# Patient Record
Sex: Male | Born: 1950
Health system: Southern US, Community
[De-identification: ages and names within clinical notes are randomized; demographics above are authoritative.]

## PROBLEM LIST (undated history)

## (undated) DIAGNOSIS — I499 Cardiac arrhythmia, unspecified: Secondary | ICD-10-CM

## (undated) DIAGNOSIS — E785 Hyperlipidemia, unspecified: Secondary | ICD-10-CM

## (undated) DIAGNOSIS — D689 Coagulation defect, unspecified: Secondary | ICD-10-CM

## (undated) DIAGNOSIS — G4730 Sleep apnea, unspecified: Secondary | ICD-10-CM

## (undated) DIAGNOSIS — N529 Male erectile dysfunction, unspecified: Secondary | ICD-10-CM

## (undated) DIAGNOSIS — E291 Testicular hypofunction: Secondary | ICD-10-CM

## (undated) DIAGNOSIS — E119 Type 2 diabetes mellitus without complications: Secondary | ICD-10-CM

## (undated) DIAGNOSIS — C801 Malignant (primary) neoplasm, unspecified: Secondary | ICD-10-CM

## (undated) DIAGNOSIS — I2510 Atherosclerotic heart disease of native coronary artery without angina pectoris: Secondary | ICD-10-CM

## (undated) DIAGNOSIS — I10 Essential (primary) hypertension: Secondary | ICD-10-CM

## (undated) DIAGNOSIS — I4891 Unspecified atrial fibrillation: Secondary | ICD-10-CM

## (undated) HISTORY — DX: Testicular hypofunction: E29.1

## (undated) HISTORY — DX: Hyperlipidemia, unspecified: E78.5

## (undated) HISTORY — DX: Unspecified atrial fibrillation: I48.91

## (undated) HISTORY — DX: Malignant (primary) neoplasm, unspecified: C80.1

## (undated) HISTORY — DX: Coagulation defect, unspecified: D68.9

## (undated) HISTORY — DX: Male erectile dysfunction, unspecified: N52.9

## (undated) HISTORY — DX: Essential (primary) hypertension: I10

## (undated) HISTORY — DX: Sleep apnea, unspecified: G47.30

## (undated) HISTORY — PX: PARATHYROIDECTOMY: SHX19

---

## 2004-06-10 ENCOUNTER — Ambulatory Visit (HOSPITAL_COMMUNITY): Admission: RE | Admit: 2004-06-10 | Discharge: 2004-06-10 | Payer: Self-pay | Admitting: Gastroenterology

## 2004-11-08 ENCOUNTER — Ambulatory Visit (HOSPITAL_COMMUNITY): Admission: RE | Admit: 2004-11-08 | Discharge: 2004-11-08 | Payer: Self-pay | Admitting: Surgery

## 2005-01-09 ENCOUNTER — Encounter (INDEPENDENT_AMBULATORY_CARE_PROVIDER_SITE_OTHER): Payer: Self-pay | Admitting: Specialist

## 2005-01-09 ENCOUNTER — Ambulatory Visit (HOSPITAL_COMMUNITY): Admission: RE | Admit: 2005-01-09 | Discharge: 2005-01-09 | Payer: Self-pay | Admitting: Surgery

## 2005-10-19 ENCOUNTER — Emergency Department (HOSPITAL_COMMUNITY): Admission: EM | Admit: 2005-10-19 | Discharge: 2005-10-20 | Payer: Self-pay | Admitting: Emergency Medicine

## 2006-04-29 ENCOUNTER — Encounter: Admission: RE | Admit: 2006-04-29 | Discharge: 2006-04-29 | Payer: Self-pay | Admitting: Orthopedic Surgery

## 2006-11-10 ENCOUNTER — Encounter: Admission: RE | Admit: 2006-11-10 | Discharge: 2006-11-10 | Payer: Self-pay | Admitting: Family Medicine

## 2007-02-25 ENCOUNTER — Observation Stay (HOSPITAL_COMMUNITY): Admission: EM | Admit: 2007-02-25 | Discharge: 2007-02-27 | Payer: Self-pay | Admitting: Emergency Medicine

## 2007-04-07 ENCOUNTER — Ambulatory Visit: Payer: Self-pay | Admitting: Family Medicine

## 2007-04-09 ENCOUNTER — Ambulatory Visit: Payer: Self-pay | Admitting: Family Medicine

## 2007-04-28 ENCOUNTER — Ambulatory Visit: Payer: Self-pay | Admitting: Family Medicine

## 2007-05-07 ENCOUNTER — Ambulatory Visit: Payer: Self-pay | Admitting: Family Medicine

## 2007-05-31 ENCOUNTER — Ambulatory Visit: Payer: Self-pay | Admitting: Family Medicine

## 2007-08-17 ENCOUNTER — Ambulatory Visit: Payer: Self-pay | Admitting: Family Medicine

## 2007-10-19 ENCOUNTER — Ambulatory Visit: Payer: Self-pay | Admitting: Family Medicine

## 2007-11-23 ENCOUNTER — Ambulatory Visit: Payer: Self-pay | Admitting: Family Medicine

## 2008-11-07 ENCOUNTER — Ambulatory Visit: Payer: Self-pay | Admitting: Family Medicine

## 2008-12-19 ENCOUNTER — Ambulatory Visit: Payer: Self-pay | Admitting: Family Medicine

## 2009-01-19 ENCOUNTER — Ambulatory Visit: Payer: Self-pay | Admitting: Family Medicine

## 2009-05-17 ENCOUNTER — Ambulatory Visit: Payer: Self-pay | Admitting: Family Medicine

## 2009-05-21 ENCOUNTER — Ambulatory Visit: Payer: Self-pay | Admitting: Family Medicine

## 2010-01-08 ENCOUNTER — Ambulatory Visit: Payer: Self-pay | Admitting: Family Medicine

## 2010-07-30 NOTE — Discharge Summary (Signed)
[name] [name], [name] [name]                 ACCOUNT NO.:  [id number]   MEDICAL RECORD NO.:  [id number]          PATIENT TYPE:  INP   LOCATION:  1410                         FACILITY:  [facility]   PHYSICIAN:  Hillery Aldo, M.D.   DATE OF BIRTH:  [birth year]   DATE OF ADMISSION:  02/25/2007  DATE OF DISCHARGE:  02/27/2007                               DISCHARGE SUMMARY   PRIMARY CARE PHYSICIAN:  None.  However, the patient has been referred  to Dr. Sharlot Gowda for primary care.   GASTROENTEROLOGIST:  Llana Aliment. Randa Evens, M.D.   DISCHARGE DIAGNOSES:  1. Acute rectal bleeding secondary to diverticular hemorrhage.  2. Acute blood loss anemia.  3. History of hypertension.  4. Dyslipidemia.  5. Obstructive sleep apnea on nocturnal CPAP.  6. Hypokalemia.  7. Hyperbilirubinemia.  8. Mild protein depletion.  9. Mild thrombocytopenia.   DISCHARGE MEDICATIONS:  1. Lipitor 10 mg daily.  2. Zetia 10 mg daily.  3. Diovan HCTZ 320/12.5 mg daily.  The patient instructed to resume on      February 28, 2007.  4. Carvedilol 12.5 mg 1/2 tablet daily.  The patient instructed to      resume in the morning of February 28, 2007.  5. Aspirin 81 mg daily.  The patient instructed to hold this x1 week.   CONSULTATIONS:  None.   BRIEF ADMISSION HISTORY OF PRESENT ILLNESS:  The patient is a very  pleasant 60 year old male with a past medical history as noted above who  presented to the hospital with painless  bright red blood per rectum.  He had a colonoscopy done by Dr. Randa Evens on June 10, 2004 which  revealed moderate diverticulosis but an otherwise normal screening  colonoscopy.  The patient was admitted for evaluation and stabilization.   DISCHARGE LABORATORY VALUES:  Sodium was 140, potassium 3.8, chloride  109, bicarbonate 27, BUN 9, creatinine 1.12, glucose 122.  Hemoglobin  was 11.8.  Platelet count 136.   HOSPITAL COURSE BY PROBLEM:  1. Rectal bleeding secondary to his diverticular hemorrhage with  acute      blood loss anemia:  The patient was admitted and had frequent      checks of his hemoglobin and hematocrit.  His presenting hemoglobin      was 14.7.  Over the course of his hospitalization it reached a low      of 11.4, and approximately 12 hours later had stabilized to 11.8.      He has not had any further significant blood in his stool.  He has      not had any abdominal pain.  He has a known history of      diverticulosis on screening colonoscopy.  The patient was deemed      stable for discharge on February 27, 2007 and instructed to follow      up with Dr. Randa Evens if he has recurrent blood in his stool or to      come back to the emergency department if he has significant rectal      hemorrhaging.  2. Hypertension:  The patient's  blood pressures were soft throughout      his hospitalization.  His antihypertensives were held, and he was      instructed to resume those in the morning following discharge.  3. Dyslipidemia:  The patient was continued on his Lipitor and Zetia.      He has a heart doctor who follows his cholesterol.  4. Obstructive sleep apnea.  The patient was maintained on his usual      nocturnal CPAP.  5. Hypokalemia:  The patient's potassium was 3.0 on the day after      admission.  He was repleted to a discharge potassium of 3.8.  6. Hyperbilirubinemia:  The patient has a total bilirubin value of      2.3.  This likely represents Gilbert syndrome.  A haptoglobin has      been checked and is pending at the time of this dictation.  7. Mild protein malnutrition:  The patient's total protein was 5.4 and      his albumin 3.2, suggestive of mild protein depletion.  No specific      intervention was planned with regard to this.  8. Mild thrombocytopenia:  The patient's platelet count was 136 on the      morning after admission.  Will follow up with primary care      physician regarding ongoing surveillance of his labs and routine      screening.  He has been  referred to Dr. Susann Givens for establishment      of a primary care physician.  He is instructed to consume a high      fiber diet and to avoid constipation to help decrease the frequency      of these episodes.  Instructed that these episodes may reoccur.      Hillery Aldo, M.D.  Electronically Signed     CR/MEDQ  D:  02/27/2007  T:  02/28/2007  Job:  [id number]   cc:   Sharlot Gowda, M.D.  Fax: [phone]   Llana Aliment. Malon Kindle., M.D.  Fax: [phone]

## 2010-07-30 NOTE — H&P (Signed)
[name], [name]                 ACCOUNT NO.:  [id number]   MEDICAL RECORD NO.:  [id number]          PATIENT TYPE:  INP   LOCATION:  1410                         FACILITY:  [facility]   PHYSICIAN:  [name] I Elsaid, MD      DATE OF BIRTH:  [birth year]   DATE OF ADMISSION:  02/25/2007  DATE OF DISCHARGE:                              HISTORY & PHYSICAL   PRIMARY CARE PHYSICIAN:  Unassigned to our service.   HISTORY OF PRESENT ILLNESS:  [name] [name] is a 60 year old Caucasian  pleasant male with a history of hypertension, dyslipidemia and history  of moderate diverticular disease on last colonoscopy done in 2006.  Presented to the hospital today with painless bright blood per rectum  which was started around 7 p.m. today.  Patient denies any abdominal  pain, fevers.  Patient admitted.  At 7 p.m. he started to have three  episodes of fresh blood per rectum, the blood was bright blood.  Patient  denies any hematemesis or hemoptysis.  Denies any abdominal pain, denies  any nausea or vomiting.  At this time patient denies any chest pain,  denies any shortness of breath, denies any blurring of vision or  dizziness.   PAST MEDICAL HISTORY:  1. Hyperlipidemia.  2. Hypertension.  3. Status post partial parathyroidism.  4. Sleep apnea, patient used CPAP.   MEDICATIONS GIVEN:  1. Diovan 320/12.5 one tab p.o. daily.  2. Lipitor 10 mg p.o. at bedtime.  3. Multivitamin one tab p.o. daily.  4. Zetia 10 mg.  5. Aspirin 81 mg p.o.  6. Coreg 6.5 mg p.o. b.i.d.   ALLERGY:  No known drug allergies.   FAMILY HISTORY:  1. Father died with heart attack.  2. Mother died with massive stroke.   SOCIAL HISTORY:  1. Patient is married.  2. Works in a company for [employer].  3. He denies any smoking.  4. He drinks beer occasionally.  5. Patient denies any heavy drinking.   EXAMINATION:  Patient is lying comfortably in bed, not in respiratory  distress, no shortness of breath.  Temperature 97.7,  blood pressure  153/100 lying flat, was 161/68, sitting blood pressure was 153/101 and  standing 156/98, pulse rate 86, respiratory rate 20, saturating 95% on  room air.  GENERAL EXAMINATION:  Patient not pale or jaundiced, no cyanosis.  MOUTH:  Mucosa is moist, no tonsil hypertrophy.  NECK:  Supple, no thyromegaly, no lymphadenopathy.  CHEST EXAMINATION:  There is some hyperpigmentation around the  shoulders, possibility of tinea versicolor.  HEART:  S1 and S2 with no added sound.  ABDOMEN:  Soft, nontender, bowel sounds positive.  EXTREMITIES:  Peripheral pulses intact and there is no edema.  CNS:  Patient alert, oriented x3 with no focal neurological findings.   BLOOD WORKUP:  PT was 31, PT/INR was 0.9, occult blood was positive.  CBC:  Hemoglobin was 14.7, hematocrit 42.3, white blood cells 9.3,  platelet 169,000, lipase 26.  CMB:  Sodium 142, potassium 3, chloride  106, bicarbonate 29, glucose 105, BUN 16, creatinine 1.14, total  bilirubin 2.5, alkaline  phosphatase 59, AST 23, ALT 25, total protein  6.6, albumin 4.1 and calcium 9.4.   ASSESSMENT AND PLAN:  1. This is a 60 year old male with a history of hypertension,      hyperlipidemia, history of moderate diverticular disease on last      colonoscopy who presented with painless bright blood per rectum      which is most probably diverticular bleeding.  Has some      orthostasis.  Patient will be admitted, started on a clear liquid      diet, IV fluid.  Will monitor hemoglobin and hematocrit for      possible transfusion.  Will consider Gastroenterology consult if      bleeding continues.  Will hold off antihypertensive medication at      this time.  Will hold off on aspirin.  2. Sleep apnea.  To continue with continuous positive airway pressure,      deep vein thrombosis and GI prophylaxis, encourage ambulation.      [name] Bosie Helper, MD  Electronically Signed     HIE/MEDQ  D:  02/25/2007  T:  02/26/2007  Job:  [id number]

## 2010-08-02 NOTE — Op Note (Signed)
[name] [name], [name] [name]                 ACCOUNT NO.:  [id number]   MEDICAL RECORD NO.:  [id number]          PATIENT TYPE:  AMB   LOCATION:  SDS                          FACILITY:  [facility]   PHYSICIAN:  Velora Heckler, MD      DATE OF BIRTH:  1950/05/06   DATE OF PROCEDURE:  01/09/2005  DATE OF DISCHARGE:                                 OPERATIVE REPORT   PREOPERATIVE DIAGNOSIS:  Primary hyperparathyroidism.   POSTOPERATIVE DIAGNOSIS:  Primary hyperparathyroidism.   PROCEDURE:  Minimally invasive parathyroidectomy (left inferior gland)   SURGEON:  Velora Heckler, MD   ASSISTANT:  Leonie Man, M.D.   ANESTHESIA:  General.   ESTIMATED BLOOD LOSS:  Minimal.   PREPARATION:  Betadine.   COMPLICATIONS:  None.   INDICATIONS:  The patient is a 60 year old white male from [place], [place]; presents at the request of Dr. Dorisann Frames for primary  hyperparathyroidism. The patient underwent localizing studies, including a  sestamibi scan and MRI scan. This localized an abnormal gland to the left  inferior position. The patient now comes to surgery for resection.   BODY OF REPORT:  The procedure was performed in OR #16 at [facility]. [facility]. The patient was brought to the operating room and placed  in the supine position on the operating room table. Following administration  of general anesthesia, the patient is prepped and draped in the usual strict  aseptic fashion. After ascertaining that an adequate level of anesthesia  been obtained, the neck is scanned with the Neoprobe. There is an area of  increased radioactivity in the left inferior position, as suggested by  diagnostic studies. His skin is marked. Using a #15 blade, a 3 cm incision  is made. Dissection was carried down through subcutaneous tissues and  platysma, and hemostasis obtained with electrocautery. Subplatysmal flaps  were elevated. Strap muscles were incised in the midline and reflected to  the  left. The left thyroid lobe was exposed. Using the Neoprobe for  guidance, an area of increased radioactivity is detected in the thyroid  thymic tract on the left side. This was gently dissected out. Bridging  venous tributaries are divided between small and medium Ligaclips. The tract  is opened and there was an obvious enlarged parathyroid gland consistent  with adenoma. Vascular pedicles were divided between small Ligaclips. The  gland was gently mobilized and completely resected using the electrocautery  for hemostasis.   Ex vivo, the counts were approximately 175% of background. Rescanning of the  neck shows absence elevated radioactivity in the left inferior position. The  Specimen was submitted to pathology, where  Dr. Charlott Rakes confirmed  parathyroid tissue consistent with adenoma. Good hemostasis was obtained in  the left neck. A small piece of Surgicel was placed along the inferior pole  of the left thyroid lobe. The strap muscles were reapproximated in midline  with interrupted 3-0 Vicryl sutures. The platysma was closed with  interrupted 3-0 Vicryl sutures. The Skin was anesthetized with local  anesthetic. The skin edges were reapproximated with a running 4-0  Vicryl  subcuticular suture. The wound was washed and dried, and Benzoin and Steri-  Strips were applied. Sterile dressings were applied. The patient was  awakened from anesthesia and brought to the recovery room in stable  condition. The patient tolerated the procedure well.      Velora Heckler, MD  Electronically Signed     TMG/MEDQ  D:  01/09/2005  T:  01/09/2005  Job:  [id number]   cc:   Dellis Anes. Idell Pickles, M.D.  Fax: [phone]   Dorisann Frames, M.D.  Fax: [phone]

## 2010-08-02 NOTE — Op Note (Signed)
NAMESYMEON, PULEO                 ACCOUNT NO.:  [id number]   MEDICAL RECORD NO.:  [id number]          PATIENT TYPE:  AMB   LOCATION:  ENDO                         FACILITY:  [facility]   PHYSICIAN:  James L. Malon Kindle., M.D.DATE OF BIRTH:  Dec 27, 1950   DATE OF PROCEDURE:  06/10/2004  DATE OF DISCHARGE:                                 OPERATIVE REPORT   PROCEDURE:  Colonoscopy.   ENDOSCOPIST:  Llana Aliment. Edwards, M.D.   MEDICATIONS:  Fentanyl 87.5 mcg, Versed 9 mg IV.   INDICATION:  Colon cancer screening.   DESCRIPTION OF PROCEDURE:  The procedure had been explained to the patient  and consent obtained.  In the left lateral decubitus position, the Olympus  pediatric adjustable scope was inserted and advanced.  The prep was  excellent.  We were able to reach the cecum without difficulty.  Scattered  to moderate diverticulosis in the sigmoid colon.  The ileocecal valve  identified by identification of the ileocecal valve and the appendical  orifice.  The scope was withdrawn.  The cecum, ascending colon, transverse  colon, splenic flexure, descending, and sigmoid colon were seen well upon  withdrawal.  No polyps seen.  The descending colon really had very little  diverticular disease.  The sigmoid colon revealed moderate diverticulosis.  The scope was withdrawn.  There were no polyps seen throughout.  The rectum  was free of polyps.  The  patient tolerated the procedure well and was  monitored throughout and maintained on low-flow oxygen.   ASSESSMENT:  1.  Normal screening colonoscopy, V76.51.  2.  Moderate diverticulosis, 562.10,   PLAN:  Will give a diverticulosis information sheet, will check the stool  yearly for blood, possibly repeat procedure again in 10 years.      JLE/MEDQ  D:  06/10/2004  T:  06/10/2004  Job:  [id number]   cc:   Dellis Anes. Idell Pickles, M.D.  [address]  [place]  [place] [address]  Fax: [phone]

## 2010-08-08 ENCOUNTER — Telehealth: Payer: Self-pay | Admitting: Family Medicine

## 2010-08-08 MED ORDER — VARDENAFIL HCL 20 MG PO TABS: 20.0000 mg | ORAL_TABLET | ORAL | Status: DC | PRN

## 2010-08-08 NOTE — Telephone Encounter (Signed)
Prescription written for Levitra

## 2010-11-28 ENCOUNTER — Telehealth: Payer: Self-pay | Admitting: Family Medicine

## 2010-11-28 NOTE — Telephone Encounter (Signed)
He needs an appointment; give him enough medicine to cover him until then.

## 2010-11-28 NOTE — Telephone Encounter (Signed)
Called in androderm patch 4 mg / 24 hr 1 patch qd #30 with no refills to pharmacy. Pt is aware to call and schedule an appointment.  CM,LPN

## 2010-12-23 LAB — COMPREHENSIVE METABOLIC PANEL
ALT: 19
ALT: 25
AST: 21
AST: 23
Albumin: 3.2 — ABNORMAL LOW
Albumin: 4.1
Alkaline Phosphatase: 49
Alkaline Phosphatase: 59
BUN: 15
BUN: 16
CO2: 26
CO2: 29
Calcium: 8.3 — ABNORMAL LOW
Calcium: 9.4
Chloride: 106
Chloride: 110
Creatinine, Ser: 1.02
Creatinine, Ser: 1.14
GFR calc Af Amer: >60
GFR calc Af Amer: >60
GFR calc non Af Amer: >60
GFR calc non Af Amer: >60
Glucose, Bld: 105 — ABNORMAL HIGH
Glucose, Bld: 116 — ABNORMAL HIGH
Potassium: 3 — ABNORMAL LOW
Potassium: 3 — ABNORMAL LOW
Sodium: 141
Sodium: 142
Total Bilirubin: 2.3 — ABNORMAL HIGH
Total Bilirubin: 2.5 — ABNORMAL HIGH
Total Protein: 5.4 — ABNORMAL LOW
Total Protein: 6.6

## 2010-12-23 LAB — CBC
HCT: 34.7 — ABNORMAL LOW
HCT: 42.3
Hemoglobin: 12.1 — ABNORMAL LOW
Hemoglobin: 14.7
MCHC: 34.7
MCHC: 34.9
MCV: 86.1
MCV: 86.3
Platelets: 136 — ABNORMAL LOW
Platelets: 169
RBC: 4.03 — ABNORMAL LOW
RBC: 4.9
RDW: 12.7
RDW: 12.8
WBC: 5.9
WBC: 9.3

## 2010-12-23 LAB — DIFFERENTIAL
Basophils Absolute: 0.1
Basophils Relative: 1
Eosinophils Absolute: 0.5
Eosinophils Relative: 5
Lymphocytes Relative: 26
Lymphs Abs: 2.4
Monocytes Absolute: 0.7
Monocytes Relative: 8
Neutro Abs: 5.7
Neutrophils Relative %: 61

## 2010-12-23 LAB — APTT: aPTT: 31

## 2010-12-23 LAB — BASIC METABOLIC PANEL
BUN: 9
CO2: 27
Calcium: 8.4
Chloride: 109
Creatinine, Ser: 1.12
GFR calc Af Amer: >60
GFR calc non Af Amer: >60
Glucose, Bld: 122 — ABNORMAL HIGH
Potassium: 3.8
Sodium: 140

## 2010-12-23 LAB — HEMOGLOBIN AND HEMATOCRIT, BLOOD
HCT: 32.7 — ABNORMAL LOW
HCT: 33.9 — ABNORMAL LOW
HCT: 38.6 — ABNORMAL LOW
Hemoglobin: 11.4 — ABNORMAL LOW
Hemoglobin: 11.8 — ABNORMAL LOW
Hemoglobin: 13.5

## 2010-12-23 LAB — LIPASE, BLOOD: Lipase: 26

## 2010-12-23 LAB — HAPTOGLOBIN: Haptoglobin: 114

## 2010-12-23 LAB — PROTIME-INR
INR: 0.9
Prothrombin Time: 12.7

## 2010-12-23 LAB — PHOSPHORUS: Phosphorus: 2.8

## 2010-12-23 LAB — TYPE AND SCREEN
ABO/RH(D): AB POS
Antibody Screen: NEGATIVE

## 2010-12-23 LAB — OCCULT BLOOD X 1 CARD TO LAB, STOOL: Fecal Occult Bld: POSITIVE

## 2010-12-23 LAB — MAGNESIUM: Magnesium: 2.2

## 2010-12-23 LAB — ABO/RH: ABO/RH(D): AB POS

## 2011-01-15 ENCOUNTER — Encounter: Payer: Self-pay | Admitting: Family Medicine

## 2011-01-17 ENCOUNTER — Ambulatory Visit (INDEPENDENT_AMBULATORY_CARE_PROVIDER_SITE_OTHER): Payer: BLUE CROSS/BLUE SHIELD | Admitting: Family Medicine

## 2011-01-17 ENCOUNTER — Encounter: Payer: Self-pay | Admitting: Family Medicine

## 2011-01-17 VITALS — BP 130/90 | HR 64 | Ht 70.0 in | Wt 245.0 lb

## 2011-01-17 DIAGNOSIS — N529 Male erectile dysfunction, unspecified: Secondary | ICD-10-CM | POA: Insufficient documentation

## 2011-01-17 DIAGNOSIS — E1159 Type 2 diabetes mellitus with other circulatory complications: Secondary | ICD-10-CM | POA: Insufficient documentation

## 2011-01-17 DIAGNOSIS — R7309 Other abnormal glucose: Secondary | ICD-10-CM

## 2011-01-17 DIAGNOSIS — Z23 Encounter for immunization: Secondary | ICD-10-CM

## 2011-01-17 DIAGNOSIS — E785 Hyperlipidemia, unspecified: Secondary | ICD-10-CM | POA: Insufficient documentation

## 2011-01-17 DIAGNOSIS — E669 Obesity, unspecified: Secondary | ICD-10-CM | POA: Insufficient documentation

## 2011-01-17 DIAGNOSIS — L989 Disorder of the skin and subcutaneous tissue, unspecified: Secondary | ICD-10-CM

## 2011-01-17 DIAGNOSIS — I10 Essential (primary) hypertension: Secondary | ICD-10-CM | POA: Insufficient documentation

## 2011-01-17 DIAGNOSIS — E1169 Type 2 diabetes mellitus with other specified complication: Secondary | ICD-10-CM | POA: Insufficient documentation

## 2011-01-17 DIAGNOSIS — Z79899 Other long term (current) drug therapy: Secondary | ICD-10-CM

## 2011-01-17 DIAGNOSIS — Z0000 Encounter for general adult medical examination without abnormal findings: Secondary | ICD-10-CM

## 2011-01-17 DIAGNOSIS — Z8639 Personal history of other endocrine, nutritional and metabolic disease: Secondary | ICD-10-CM | POA: Insufficient documentation

## 2011-01-17 DIAGNOSIS — E291 Testicular hypofunction: Secondary | ICD-10-CM | POA: Insufficient documentation

## 2011-01-17 DIAGNOSIS — G4730 Sleep apnea, unspecified: Secondary | ICD-10-CM | POA: Insufficient documentation

## 2011-01-17 DIAGNOSIS — R7302 Impaired glucose tolerance (oral): Secondary | ICD-10-CM

## 2011-01-17 LAB — COMPREHENSIVE METABOLIC PANEL
Albumin: 4.8 g/dL (ref 3.5–5.2)
BUN: 12 mg/dL (ref 6–23)
Calcium: 9.6 mg/dL (ref 8.4–10.5)
Chloride: 103 mEq/L (ref 96–112)
Glucose, Bld: 128 mg/dL — ABNORMAL HIGH (ref 70–99)
Potassium: 3.6 mEq/L (ref 3.5–5.3)

## 2011-01-17 LAB — CBC WITH DIFFERENTIAL/PLATELET
Basophils Relative: 1 % (ref 0–1)
HCT: 46.3 % (ref 39.0–52.0)
Hemoglobin: 15.4 g/dL (ref 13.0–17.0)
Lymphocytes Relative: 23 % (ref 12–46)
MCHC: 33.3 g/dL (ref 30.0–36.0)
MCV: 89.7 fL (ref 78.0–100.0)
Monocytes Absolute: 0.5 10*3/uL (ref 0.1–1.0)
Monocytes Relative: 7 % (ref 3–12)
Neutro Abs: 4.8 10*3/uL (ref 1.7–7.7)

## 2011-01-17 LAB — LIPID PANEL
LDL Cholesterol: 108 mg/dL — ABNORMAL HIGH (ref 0–99)
Total CHOL/HDL Ratio: 4.6 Ratio
VLDL: 29 mg/dL (ref 0–40)

## 2011-01-17 LAB — POCT URINALYSIS DIPSTICK
Bilirubin, UA: NEGATIVE
Ketones, UA: NEGATIVE
Leukocytes, UA: NEGATIVE
Spec Grav, UA: 1.01
pH, UA: 7

## 2011-01-17 LAB — POCT GLYCOSYLATED HEMOGLOBIN (HGB A1C): Hemoglobin A1C: 5.9

## 2011-01-17 MED ORDER — OLMESARTAN-AMLODIPINE-HCTZ 40-5-12.5 MG PO TABS: 12.5000 mg | ORAL_TABLET | ORAL | Status: DC

## 2011-01-17 MED ORDER — VARDENAFIL HCL 20 MG PO TABS: 20.0000 mg | ORAL_TABLET | ORAL | Status: DC | PRN

## 2011-01-17 MED ORDER — CARVEDILOL 25 MG PO TABS: 25.0000 mg | ORAL_TABLET | Freq: Two times a day (BID) | ORAL | Status: DC

## 2011-01-17 MED ORDER — EZETIMIBE-SIMVASTATIN 10-20 MG PO TABS: 1.0000 | ORAL_TABLET | Freq: Every day | ORAL | Status: DC

## 2011-01-17 MED ORDER — TESTOSTERONE 5 MG/24HR TD PT24: 1.0000 | MEDICATED_PATCH | Freq: Every day | TRANSDERMAL | Status: DC

## 2011-01-17 NOTE — Progress Notes (Signed)
Subjective:    Patient ID: [name] [name], male    DOB: 06/06/1950, 60 y.o.   MRN: [id number]  HPI He is here for complete examination. He does have hypertension and continues on meds for that without difficulty. He also continues on his testosterone patches and has noted an improvement in his stamina and strength since being on the medication. He has sleep apnea and still uses his CPAP. He has not had a readout on his machine in over a year. He has a previous history of hyperparathyroid disease with partial parathyroidectomy. He has not had followup on this. He uses Levitra for his erectile dysfunction. He does not exercise regularly. His work is going well which requires a lot of international travel.   Review of Systems  Constitutional: Negative.   HENT: Negative.   Eyes: Negative.   Respiratory: Negative.   Cardiovascular: Negative.   Gastrointestinal: Negative.   Genitourinary: Negative.   Musculoskeletal: Negative.   Skin: Negative.   Neurological: Negative.   Hematological: Negative.   Psychiatric/Behavioral: Negative.        Objective:   Physical Exam BP 130/90  Pulse 64  Ht 5\' 10"  (1.778 m)  Wt 245 lb (111.131 kg)  BMI 35.15 kg/m2  General Appearance:    Alert, cooperative, no distress, appears stated age  Head:    Normocephalic, without obvious abnormality, atraumatic  Eyes:    PERRL, conjunctiva/corneas clear, EOM's intact, fundi    benign  Ears:    Normal TM's and external ear canals  Nose:   Nares normal, mucosa normal, no drainage or sinus   tenderness  Throat:   Lips, mucosa, and tongue normal; teeth and gums normal  Neck:   Supple, no lymphadenopathy;  thyroid:  no   enlargement/tenderness/nodules; no carotid   bruit or JVD  Back:    Spine nontender, no curvature, ROM normal, no CVA     tenderness  Lungs:     Clear to auscultation bilaterally without wheezes, rales or     ronchi; respirations unlabored  Chest Wall:    No tenderness or deformity   Heart:     Regular rate and rhythm, S1 and S2 normal, no murmur, rub   or gallop  Breast Exam:    No chest wall tenderness, masses or gynecomastia  Abdomen:     Soft, non-tender, nondistended, normoactive bowel sounds,    no masses, no hepatosplenomegaly  Genitalia:    Normal male external genitalia without lesions.  Testicles without masses.  No inguinal hernias.  Rectal:    Normal sphincter tone, no masses or tenderness; guaiac negative stool.  Prostate smooth, no nodules, not enlarged.  Extremities:   No clubbing, cyanosis or edema  Pulses:   2+ and symmetric all extremities  Skin:   Skin color, texture, turgor normal, no rashes or lesions  Lymph nodes:   Cervical, supraclavicular, and axillary nodes normal  Neurologic:   CNII-XII intact, normal strength, sensation and gait; reflexes 2+ and symmetric throughout          Psych:   Normal mood, affect, hygiene and grooming.    Hemoglobin A1c is 5.9      Assessment & Plan:   1. Physical exam, annual  POCT Urinalysis Dipstick, Visual acuity screening, Varicella-zoster vaccine subcutaneous, Pneumococcal polysaccharide vaccine 23-valent greater than or equal to 2yo subcutaneous/IM  2. Hypertension  CBC with Differential, Comprehensive metabolic panel, Lipid panel  3. Hyperlipidemia LDL goal < 100    4. Hypogonadism male  CBC with  Differential, Comprehensive metabolic panel, Lipid panel, PSA, Testosterone  5. Sleep apnea    6. History of hyperparathyroidism    7. ED (erectile dysfunction)    8. Obesity (BMI 30-39.9)    9. Encounter for long-term (current) use of other medications  CBC with Differential, Comprehensive metabolic panel, Lipid panel  10. Glucose intolerance (impaired glucose tolerance)  POCT HgB A1C  11. Skin lesion of chest wall     routine blood screening ordered. He will also return for excision of the lesion on his chest. His medications were renewed. Did recommend diet and exercise modification. Zostavax and Pneumovax were given

## 2011-01-17 NOTE — Patient Instructions (Signed)
Continue on all your present medications. 

## 2011-01-18 LAB — PSA: PSA: 0.66 ng/mL (ref <=4.00)

## 2011-01-18 LAB — TESTOSTERONE: Testosterone: 309.62 ng/dL (ref 250–890)

## 2011-01-20 ENCOUNTER — Telehealth: Payer: Self-pay | Admitting: Internal Medicine

## 2011-01-20 MED ORDER — TESTOSTERONE 4 MG/24HR TD PT24: 2.0000 | MEDICATED_PATCH | Freq: Every day | TRANSDERMAL | Status: DC

## 2011-01-20 NOTE — Telephone Encounter (Signed)
He said ya'll talked about increase the testosterone to 2 patches a day and you refilled it for one, so he wants to know if you can refill it so he can use 2 patches a day instead of one

## 2011-01-20 NOTE — Telephone Encounter (Signed)
His present dosing is 2 of the 4 mg patches. This was called in to his pharmacy.

## 2011-01-20 NOTE — Telephone Encounter (Signed)
Addended by: [name] on: 01/20/2011 02:46 PM   Modules accepted: Orders

## 2011-01-22 ENCOUNTER — Other Ambulatory Visit: Payer: Self-pay | Admitting: Family Medicine

## 2011-01-22 NOTE — Telephone Encounter (Signed)
Called [facility] to order refills and they were already done.  Order [id number].  Levitra cannot be refilled until 02/05/11.

## 2011-02-12 ENCOUNTER — Encounter: Payer: Self-pay | Admitting: Family Medicine

## 2011-02-12 ENCOUNTER — Other Ambulatory Visit: Payer: Self-pay | Admitting: Internal Medicine

## 2011-02-12 ENCOUNTER — Ambulatory Visit (INDEPENDENT_AMBULATORY_CARE_PROVIDER_SITE_OTHER): Payer: BLUE CROSS/BLUE SHIELD | Admitting: Family Medicine

## 2011-02-12 VITALS — BP 130/90 | HR 64 | Wt 248.0 lb

## 2011-02-12 DIAGNOSIS — E291 Testicular hypofunction: Secondary | ICD-10-CM

## 2011-02-12 DIAGNOSIS — J209 Acute bronchitis, unspecified: Secondary | ICD-10-CM

## 2011-02-12 DIAGNOSIS — L989 Disorder of the skin and subcutaneous tissue, unspecified: Secondary | ICD-10-CM

## 2011-02-12 MED ORDER — TESTOSTERONE 4 MG/24HR TD PT24: 2.0000 | MEDICATED_PATCH | Freq: Every day | TRANSDERMAL | Status: DC

## 2011-02-12 MED ORDER — AMOXICILLIN 875 MG PO TABS: 875.0000 mg | ORAL_TABLET | Freq: Two times a day (BID) | ORAL | Status: DC

## 2011-02-12 NOTE — Progress Notes (Signed)
  Subjective:    Patient ID: [name] [name], male    DOB: 04-27-1950, 60 y.o.   MRN: [id number]  HPI He has a two-week history started with a dry cough and runny nose but no sore throat, earache, fever or chills. He is now having chest congestion and coughing. He has no underlying allergies and does not smoke. He also has a lesion present on his anterior chest that he would like removed. It is interfering with wearing shirts. He also states that he has yet to receive his Androderm patches. Review of the record indicates that was sent on November 5.  Review of Systems     Objective:   Physical Exam alert and in no distress. Tympanic membranes and canals are normal. Throat is clear. Tonsils are normal. Neck is supple without adenopathy or thyromegaly. Cardiac exam shows a regular sinus rhythm without murmurs or gallops. Lungs are clear to auscultation.        Assessment & Plan:   1. Skin lesion of chest wall   2. Bronchitis, acute   3. Hypogonadism male    I will place him on Amoxil and encouraged him to call me if not entirely better. The lesion was injected with Xylocaine and epinephrine. It was shaved in the bases hyfrecated. He will be sent for pathology. We will resend his Androderm.

## 2011-02-12 NOTE — Patient Instructions (Signed)
Take all the antibiotic and call me for not totally back to normal 

## 2011-02-12 NOTE — Telephone Encounter (Signed)
Phoned in testosterone to [facility]

## 2011-02-17 ENCOUNTER — Telehealth: Payer: Self-pay

## 2011-02-17 NOTE — Telephone Encounter (Signed)
Called pt to inform him all test were benign left word with wife

## 2011-02-20 ENCOUNTER — Other Ambulatory Visit: Payer: Self-pay

## 2011-02-20 MED ORDER — AMOXICILLIN 875 MG PO TABS: 875.0000 mg | ORAL_TABLET | Freq: Two times a day (BID) | ORAL | Status: AC

## 2011-02-20 NOTE — Telephone Encounter (Signed)
The cough is not completely gone,. I will give him another round of antibiotic.

## 2011-02-20 NOTE — Telephone Encounter (Signed)
CALLED PT INFORMED WIFE

## 2011-02-20 NOTE — Telephone Encounter (Signed)
PT DONE WITH ANTIBIOTIC SAID COUGH WILL NOT GO AWAY CAN YOU PLEASE GIVE ONE MORE ROUND OF MED [facility]

## 2011-02-20 NOTE — Telephone Encounter (Signed)
Let him know that I called another round of antibiotic and for him.

## 2011-06-04 ENCOUNTER — Telehealth: Payer: Self-pay | Admitting: Family Medicine

## 2011-06-04 NOTE — Telephone Encounter (Signed)
DONE

## 2011-06-26 ENCOUNTER — Other Ambulatory Visit: Payer: Self-pay | Admitting: Dermatology

## 2011-07-08 ENCOUNTER — Telehealth: Payer: Self-pay | Admitting: Family Medicine

## 2011-07-08 DIAGNOSIS — Z029 Encounter for administrative examinations, unspecified: Secondary | ICD-10-CM

## 2011-07-08 NOTE — Telephone Encounter (Signed)
LM

## 2011-07-09 ENCOUNTER — Telehealth: Payer: Self-pay | Admitting: Family Medicine

## 2011-07-09 NOTE — Telephone Encounter (Signed)
LM

## 2011-09-05 ENCOUNTER — Telehealth: Payer: Self-pay | Admitting: Internal Medicine

## 2011-09-06 MED ORDER — VARDENAFIL HCL 20 MG PO TABS: 20.0000 mg | ORAL_TABLET | ORAL | Status: DC | PRN

## 2011-09-06 NOTE — Telephone Encounter (Signed)
levitra renewed

## 2011-09-09 ENCOUNTER — Other Ambulatory Visit: Payer: Self-pay

## 2011-09-10 ENCOUNTER — Telehealth: Payer: Self-pay | Admitting: Internal Medicine

## 2011-09-11 ENCOUNTER — Other Ambulatory Visit: Payer: Self-pay

## 2011-09-11 MED ORDER — VARDENAFIL HCL 20 MG PO TABS: 20.0000 mg | ORAL_TABLET | ORAL | Status: DC | PRN

## 2011-09-11 NOTE — Telephone Encounter (Signed)
Pt wants med sent to [facility]

## 2011-09-11 NOTE — Telephone Encounter (Signed)
Rx sent in

## 2011-09-29 ENCOUNTER — Encounter: Payer: Self-pay | Admitting: Medical

## 2011-09-29 ENCOUNTER — Ambulatory Visit (INDEPENDENT_AMBULATORY_CARE_PROVIDER_SITE_OTHER): Payer: BLUE CROSS/BLUE SHIELD | Admitting: Medical

## 2011-09-29 VITALS — BP 120/70 | HR 76 | Temp 98.1°F | Resp 16 | Wt 225.5 lb

## 2011-09-29 DIAGNOSIS — T6391XA Toxic effect of contact with unspecified venomous animal, accidental (unintentional), initial encounter: Secondary | ICD-10-CM

## 2011-09-29 DIAGNOSIS — B351 Tinea unguium: Secondary | ICD-10-CM

## 2011-09-29 DIAGNOSIS — T63441A Toxic effect of venom of bees, accidental (unintentional), initial encounter: Secondary | ICD-10-CM

## 2011-09-29 DIAGNOSIS — T63461A Toxic effect of venom of wasps, accidental (unintentional), initial encounter: Secondary | ICD-10-CM

## 2011-09-29 MED ORDER — EPINEPHRINE 0.3 MG/0.3ML IJ DEVI: 0.3000 mg | Freq: Once | INTRAMUSCULAR | Status: DC

## 2011-09-29 MED ORDER — TERBINAFINE HCL 250 MG PO TABS: 250.0000 mg | ORAL_TABLET | Freq: Every day | ORAL | Status: AC

## 2011-09-29 MED ORDER — METHYLPREDNISOLONE 4 MG PO KIT: PACK | ORAL | Status: AC

## 2011-09-29 MED ORDER — AMOXICILLIN 875 MG PO TABS: 875.0000 mg | ORAL_TABLET | Freq: Two times a day (BID) | ORAL | Status: AC

## 2011-09-29 NOTE — Patient Instructions (Signed)
Begin Medrol steroid dosepack for allergic reaction to bee sting.  Continue benadryl every 4 hours.  Elevated the leg and arm.  Use ice pack for pain and swelling.  Keep the sting areas clean- soap and water.  If not improving in 24-36 hours, call or return  If you get tongue, lip, face or generalized swelling, use the Epi pen and go to the emergency dept.   If not, keep the Epi pen on hand for future potential allergic reactions.    Begin Lamisil daily for toenail fungus.  Return in 6 weeks for liver tests and recheck.   Anaphylactic Reaction An anaphylactic reaction is a severe allergic reaction. It may be caused by medicines, food, insect bites, or other common items. It cannot spread from one person to another (contagious). It can be life threatening and require hospitalization.  SYMPTOMS  Symptoms may include, but are not limited to, the following:  Skin rash or hives.   Itching.   Chest tightness.   Swelling (including the eyes, tongue, or lips).   Trouble breathing or swallowing.   Lightheadedness or fainting.   Stomach pains or vomiting.  Symptoms may gradually disappear when you are no longer around the substance that caused the problem. You may find that 2 or 3 days are needed for symptoms to go away completely. HOME CARE INSTRUCTIONS   Carry a card or wear a bracelet that lists anything that has caused past anaphylaxis or a less severe allergic reaction.   If 1 or more medicines have caused past problems, you need to avoid the same or similar medicines in the future.   Talk with a medical caregiver before using any new prescription or over-the-counter medicines.   If you develop hives or a rash:   Apply cold compresses to the skin, or take a cool bath to help reduce itching.   Avoid hot baths or showers. This might make itching or the rash worse.   Wear loose-fitting clothes.   If you have had a severe allergic reaction in the past:   Carry an anaphylaxis kit  with you at all times. Both you and family members should be shown how to give the medicines in the kit. This can be lifesaving if there is another severe reaction. If it needs to be used, and symptoms improve, it is still important for you to seek immediate medical care or call your local emergency services (911 in the [place] This is because severe symptoms can return when the medicine in the kit wears off.   If hospitalization is not required, you should have fast access to emergency services if the problem recurs. If a family member or friend has been shown how to give the medicines in an anaphylaxis kit and can stay with you, he or she can also help give the medicines from the kit if problems return.   Make sure you renew your anaphylaxis kit when it gets close to being out of date.   You may return to your normal activities when the allergic symptoms are gone.  SEEK MEDICAL CARE IF:   You develop symptoms of an allergic reaction to a new substance. Symptoms may occur right away or minutes later.   Rash, hives, or itching occurs.   You have different symptoms than you have had previously.  SEEK IMMEDIATE MEDICAL CARE IF:   You have difficulty breathing, start to wheeze, or a tight feeling in the chest or throat develops.   You develop swelling of  the mouth or tongue or swelling or itching over most of your body.   You develop severe stomach pains or repeated vomiting.   You feel very lightheaded or pass out.   Chest pain develops or there is a worsening of problems noted above. THIS IS AN EMERGENCY. Call your local emergency services (911 in the [place]  MAKE SURE YOU:   Understand these instructions.   Will watch your condition.   Will get help right away if you have recurrent problems or have problems that are getting worse.  Document Released: 03/03/2005 Document Revised: 02/20/2011 Document Reviewed: 10/20/2007 [facility] Patient Information 2012 [place], [place].

## 2011-09-29 NOTE — Progress Notes (Signed)
Subjective: Here for 3 c/o.  He notes yesterday was mowing and ran over nest of yellow jackets. Got stung or bit 12 times.  But in the last 12 hours having left hand and right ankle swelling, 2 of the locations where she was bitten.   He notes no prior similar reaction to bee sting.   He has used some benadryl several times the last 12 hours.   No other aggravating or relieving factors.    He notes toe nail fungus for months on right foot.  Not really improved with OTC cream.  He reports rough dry skin of right heel, wonders if this is fungus.   No other c/o.  The following portions of the patient's history were reviewed and updated as appropriate: allergies, current medications, past family history, past medical history, past social history, past surgical history and problem list.  Past Medical History  Diagnosis Date  . Hypertension   . Dyslipidemia   . Hypogonadism, male   . Hyperparathyroidism   . Sleep apnea   . ED (erectile dysfunction)   . Hyperlipidemia     No Known Allergies   Review of Systems ROS reviewed and was negative other than noted in HPI or above.    Objective:   Physical Exam  General appearance: alert, no distress, WD/WN Left hand with generalized swelling and 2 small 2-3 mm area of erythema and crusting, s/p stings or bites, similar swollen area with bit marks right medial ankle, and several other sting/bite marks without obvious stingers or swelling.  Right toenails thickened and yellow Right heel rough dry skin   Assessment and Plan :     Encounter Diagnoses  Name Primary?  . Bee sting reaction Yes  . Onychomycosis    Plan: Bee sting - begin Medrol dose pack, c/t benadryl q4 hours, ice pack to the left hand and right ankle, elevate those joints.  Script for Epi pen in the event of anaphylaxis. Discussed signs/symptoms of anaphylaxis.  Gave script for amoxicillin in the event of cellulitis, but currently doesn't appear this way.  Call/return if not  improving or worse in next 24 hours.   onychomycosis - begin Lamisil oral.  discussed risk/benefits of medication. Recheck 6wk.

## 2011-10-14 ENCOUNTER — Telehealth: Payer: Self-pay | Admitting: Internal Medicine

## 2011-10-14 MED ORDER — VARDENAFIL HCL 20 MG PO TABS: 20.0000 mg | ORAL_TABLET | ORAL | Status: DC | PRN

## 2011-10-14 NOTE — Telephone Encounter (Signed)
Faxed levitra 20mg  to [facility] that Dr. Susann Givens signed

## 2011-11-04 ENCOUNTER — Encounter: Payer: Self-pay | Admitting: Internal Medicine

## 2011-11-10 ENCOUNTER — Other Ambulatory Visit: Payer: BLUE CROSS/BLUE SHIELD

## 2011-11-10 DIAGNOSIS — Z79899 Other long term (current) drug therapy: Secondary | ICD-10-CM

## 2011-11-10 LAB — HEPATIC FUNCTION PANEL
ALT: 16 U/L (ref 0–53)
Bilirubin, Direct: 0.2 mg/dL (ref 0.0–0.3)
Total Bilirubin: 0.9 mg/dL (ref 0.3–1.2)

## 2012-01-28 ENCOUNTER — Other Ambulatory Visit: Payer: Self-pay | Admitting: Medical

## 2012-01-29 NOTE — Telephone Encounter (Signed)
Rx refill for Lamsil.

## 2012-01-29 NOTE — Telephone Encounter (Signed)
F/u regarding nail fungus

## 2012-01-30 NOTE — Telephone Encounter (Signed)
PT IS SCHEDULED FOR A PHYSICAL 11/26

## 2012-02-05 ENCOUNTER — Telehealth: Payer: Self-pay | Admitting: Family Medicine

## 2012-02-05 ENCOUNTER — Other Ambulatory Visit: Payer: Self-pay

## 2012-02-05 MED ORDER — OLMESARTAN-AMLODIPINE-HCTZ 40-5-12.5 MG PO TABS: 12.5000 mg | ORAL_TABLET | ORAL | Status: DC

## 2012-02-05 MED ORDER — EZETIMIBE-SIMVASTATIN 10-20 MG PO TABS: 1.0000 | ORAL_TABLET | Freq: Every day | ORAL | Status: DC

## 2012-02-05 NOTE — Telephone Encounter (Signed)
Sent in meds 

## 2012-02-10 ENCOUNTER — Ambulatory Visit (INDEPENDENT_AMBULATORY_CARE_PROVIDER_SITE_OTHER): Payer: BLUE CROSS/BLUE SHIELD | Admitting: Family Medicine

## 2012-02-10 ENCOUNTER — Encounter: Payer: Self-pay | Admitting: Family Medicine

## 2012-02-10 VITALS — BP 148/88 | HR 60 | Ht 70.0 in | Wt 245.0 lb

## 2012-02-10 DIAGNOSIS — Z8639 Personal history of other endocrine, nutritional and metabolic disease: Secondary | ICD-10-CM

## 2012-02-10 DIAGNOSIS — G4730 Sleep apnea, unspecified: Secondary | ICD-10-CM

## 2012-02-10 DIAGNOSIS — E785 Hyperlipidemia, unspecified: Secondary | ICD-10-CM

## 2012-02-10 DIAGNOSIS — E291 Testicular hypofunction: Secondary | ICD-10-CM

## 2012-02-10 DIAGNOSIS — E669 Obesity, unspecified: Secondary | ICD-10-CM

## 2012-02-10 DIAGNOSIS — I10 Essential (primary) hypertension: Secondary | ICD-10-CM

## 2012-02-10 DIAGNOSIS — Z862 Personal history of diseases of the blood and blood-forming organs and certain disorders involving the immune mechanism: Secondary | ICD-10-CM

## 2012-02-10 DIAGNOSIS — Z0000 Encounter for general adult medical examination without abnormal findings: Secondary | ICD-10-CM

## 2012-02-10 DIAGNOSIS — N529 Male erectile dysfunction, unspecified: Secondary | ICD-10-CM

## 2012-02-10 LAB — HEMOCCULT GUIAC POC 1CARD (OFFICE)

## 2012-02-10 LAB — POCT URINALYSIS DIPSTICK
Ketones, UA: NEGATIVE
Leukocytes, UA: NEGATIVE
Protein, UA: NEGATIVE

## 2012-02-10 LAB — CBC WITH DIFFERENTIAL/PLATELET
Eosinophils Absolute: 0.3 10*3/uL (ref 0.0–0.7)
Eosinophils Relative: 5 % (ref 0–5)
Hemoglobin: 15.4 g/dL (ref 13.0–17.0)
Lymphs Abs: 1.4 10*3/uL (ref 0.7–4.0)
MCH: 30.3 pg (ref 26.0–34.0)
MCV: 84.4 fL (ref 78.0–100.0)
Monocytes Relative: 9 % (ref 3–12)
Platelets: 163 10*3/uL (ref 150–400)
RBC: 5.08 MIL/uL (ref 4.22–5.81)

## 2012-02-10 MED ORDER — EZETIMIBE-SIMVASTATIN 10-20 MG PO TABS: 1.0000 | ORAL_TABLET | Freq: Every day | ORAL | Status: DC

## 2012-02-10 MED ORDER — VARDENAFIL HCL 20 MG PO TABS: 20.0000 mg | ORAL_TABLET | ORAL | Status: DC | PRN

## 2012-02-10 MED ORDER — CARVEDILOL 25 MG PO TABS: 25.0000 mg | ORAL_TABLET | Freq: Two times a day (BID) | ORAL | Status: DC

## 2012-02-10 MED ORDER — TESTOSTERONE 4 MG/24HR TD PT24: 2.0000 | MEDICATED_PATCH | Freq: Every day | TRANSDERMAL | Status: DC

## 2012-02-10 MED ORDER — OLMESARTAN-AMLODIPINE-HCTZ 40-5-12.5 MG PO TABS: 12.5000 mg | ORAL_TABLET | ORAL | Status: DC

## 2012-02-10 NOTE — Progress Notes (Signed)
Subjective:    Patient ID: [name] [name], male    DOB: [birth year], 61 y.o.   MRN: [id number]  HPI He is here for complete examination. He continues on medications listed in the chart. He does have sleep apnea and is doing quite well on his CPAP. He has not had a read out on this in quite some time. He continues on Androderm. He would like a refill on his Levitra. He does walk regularly. He has made no changes in his eating habits. Social history was reviewed. He is contemplating retirement within the next several years. He has been on Lamisil and would like that reevaluated.   Review of Systems Negative except as above    Objective:   Physical Exam BP 148/88  Pulse 60  Ht 5\' 10"  (1.778 m)  Wt 245 lb (111.131 kg)  BMI 35.15 kg/m2  SpO2 97%  General Appearance:    Alert, cooperative, no distress, appears stated age  Head:    Normocephalic, without obvious abnormality, atraumatic  Eyes:    PERRL, conjunctiva/corneas clear, EOM's intact, fundi    benign  Ears:    Normal TM's and external ear canals  Nose:   Nares normal, mucosa normal, no drainage or sinus   tenderness  Throat:   Lips, mucosa, and tongue normal; teeth and gums normal  Neck:   Supple, no lymphadenopathy;  thyroid:  no   enlargement/tenderness/nodules; no carotid   bruit or JVD  Back:    Spine nontender, no curvature, ROM normal, no CVA     tenderness  Lungs:     Clear to auscultation bilaterally without wheezes, rales or     ronchi; respirations unlabored  Chest Wall:    No tenderness or deformity   Heart:    Regular rate and rhythm, S1 and S2 normal, no murmur, rub   or gallop  Breast Exam:    No chest wall tenderness, masses or gynecomastia  Abdomen:     Soft, non-tender, nondistended, normoactive bowel sounds,    no masses, no hepatosplenomegaly  Genitalia:    Normal male external genitalia without lesions.  Testicles without masses.  No inguinal hernias.  Rectal:    Normal sphincter tone, no masses or tenderness;  guaiac negative stool.  Prostate smooth, no nodules, not enlarged.  Extremities:   No clubbing, cyanosis or edema, down on his toenails did show normal growth at about 50% of the total nail   Pulses:   2+ and symmetric all extremities  Skin:   Skin color, texture, turgor normal, no rashes or lesions  Lymph nodes:   Cervical, supraclavicular, and axillary nodes normal  Neurologic:   CNII-XII intact, normal strength, sensation and gait; reflexes 2+ and symmetric throughout          Psych:   Normal mood, affect, hygiene and grooming.           Assessment & Plan:   1. Routine general medical examination at a health care facility  Lipid panel, CBC with Differential, Comprehensive metabolic panel, PSA, Hemoccult - 1 Card (office)  2. Hypertension  POCT Urinalysis Dipstick, Olmesartan-Amlodipine-HCTZ (TRIBENZOR) 40-5-12.5 MG TABS, carvedilol (COREG) 25 MG tablet  3. Hyperlipidemia LDL goal < 100  ezetimibe-simvastatin (VYTORIN) 10-20 MG per tablet  4. Hypogonadism male  PSA, testosterone (ANDRODERM) 4 MG/24HR PT24 patch, Testosterone  5. Sleep apnea    6. History of hyperparathyroidism    7. ED (erectile dysfunction)  vardenafil (LEVITRA) 20 MG tablet  8. Obesity (BMI 30-39.9)  recommend he look at his eating habits especially in regard to carbohydrates and fats. Also recommend that he stop taking the Lamisil. We will get a CPAP readout.

## 2012-02-11 LAB — COMPREHENSIVE METABOLIC PANEL
BUN: 18 mg/dL (ref 6–23)
CO2: 32 mEq/L (ref 19–32)
Creat: 0.9 mg/dL (ref 0.50–1.35)
Glucose, Bld: 94 mg/dL (ref 70–99)
Total Bilirubin: 1.9 mg/dL — ABNORMAL HIGH (ref 0.3–1.2)

## 2012-02-11 LAB — PSA: PSA: 0.83 ng/mL (ref <=4.00)

## 2012-02-11 LAB — LIPID PANEL
Cholesterol: 178 mg/dL (ref 0–200)
Total CHOL/HDL Ratio: 4.5 Ratio

## 2012-02-16 LAB — TESTOSTERONE: Testosterone: 285 ng/dL (ref 241–827)

## 2012-02-25 NOTE — Progress Notes (Signed)
Quick Note:  LEFT MESSAGE TO CALL BACK  ______

## 2012-03-01 ENCOUNTER — Telehealth: Payer: Self-pay

## 2012-03-01 ENCOUNTER — Other Ambulatory Visit: Payer: Self-pay

## 2012-03-01 MED ORDER — TESTOSTERONE 2.5 MG/24HR TD PT24: 1.0000 | MEDICATED_PATCH | Freq: Every day | TRANSDERMAL | Status: DC

## 2012-03-01 NOTE — Telephone Encounter (Signed)
Let him know that since his testosterone is low he will need to use 2 patches the 4 mg and the Mueller 1 which is 2.5 mg daily and recheck this in one month

## 2012-03-01 NOTE — Telephone Encounter (Signed)
PT IS AWARE TO PLACE 1 2.5 AND 1 4MG  PATCH ON DAILY

## 2012-03-01 NOTE — Telephone Encounter (Signed)
Dr.lalonde pt finally go back with me he states he was on to 4 mg a day but he took his self off he said he will restart

## 2012-03-01 NOTE — Telephone Encounter (Signed)
Faxed med in

## 2012-04-26 ENCOUNTER — Other Ambulatory Visit: Payer: Self-pay

## 2012-04-26 DIAGNOSIS — E291 Testicular hypofunction: Secondary | ICD-10-CM

## 2012-04-26 MED ORDER — TESTOSTERONE 4 MG/24HR TD PT24: 1.0000 | MEDICATED_PATCH | Freq: Every day | TRANSDERMAL | Status: DC

## 2012-04-26 MED ORDER — TESTOSTERONE 2.5 MG/24HR TD PT24: 1.0000 | MEDICATED_PATCH | Freq: Every day | TRANSDERMAL | Status: DC

## 2012-04-26 MED ORDER — TESTOSTERONE 4 MG/24HR TD PT24: 2.0000 | MEDICATED_PATCH | Freq: Every day | TRANSDERMAL | Status: DC

## 2012-04-26 NOTE — Telephone Encounter (Signed)
PT CALLED AND LEFT MESSAGE HE HAS NOT STARTED HIS PATCHES AND HIS PHARMACY HAD CHANGED ASKED IF I WOULD SEND IN AND I HAVE

## 2012-04-27 ENCOUNTER — Telehealth: Payer: Self-pay | Admitting: Internal Medicine

## 2012-04-27 MED ORDER — TESTOSTERONE 2 MG/24HR TD PT24: 1.0000 | MEDICATED_PATCH | Freq: Every day | TRANSDERMAL | Status: DC

## 2012-04-27 NOTE — Telephone Encounter (Signed)
[facility], the markers of androderm has discontinued the androderm 2.5mg  patch and they have replaced it with androderm 2mg  patch a smaller once-daily patch. if appropriate to substitute androderm 2mg , they want you to [facility] out the [facility] for the new med

## 2012-04-27 NOTE — Telephone Encounter (Signed)
Okay to switch to this patch

## 2012-05-03 ENCOUNTER — Telehealth: Payer: Self-pay | Admitting: Family Medicine

## 2012-05-03 NOTE — Telephone Encounter (Signed)
LM

## 2012-05-19 DIAGNOSIS — Z0279 Encounter for issue of other medical certificate: Secondary | ICD-10-CM

## 2012-06-15 ENCOUNTER — Other Ambulatory Visit: Payer: Self-pay

## 2012-06-15 DIAGNOSIS — I10 Essential (primary) hypertension: Secondary | ICD-10-CM

## 2012-06-15 DIAGNOSIS — N529 Male erectile dysfunction, unspecified: Secondary | ICD-10-CM

## 2012-06-15 DIAGNOSIS — E785 Hyperlipidemia, unspecified: Secondary | ICD-10-CM

## 2012-06-15 MED ORDER — OLMESARTAN-AMLODIPINE-HCTZ 40-5-12.5 MG PO TABS: 12.5000 mg | ORAL_TABLET | ORAL | Status: DC

## 2012-06-15 MED ORDER — CARVEDILOL 25 MG PO TABS: 25.0000 mg | ORAL_TABLET | Freq: Two times a day (BID) | ORAL | Status: DC

## 2012-06-15 MED ORDER — VARDENAFIL HCL 20 MG PO TABS: 20.0000 mg | ORAL_TABLET | ORAL | Status: DC | PRN

## 2012-06-15 MED ORDER — EZETIMIBE-SIMVASTATIN 10-20 MG PO TABS: 1.0000 | ORAL_TABLET | Freq: Every day | ORAL | Status: DC

## 2012-06-15 NOTE — Telephone Encounter (Signed)
Pt called and stated that [facility]

## 2012-06-17 ENCOUNTER — Telehealth: Payer: Self-pay | Admitting: Family Medicine

## 2012-06-24 ENCOUNTER — Telehealth: Payer: Self-pay | Admitting: Family Medicine

## 2012-06-24 NOTE — Telephone Encounter (Signed)
LM

## 2013-01-20 ENCOUNTER — Other Ambulatory Visit: Payer: Self-pay

## 2013-03-07 ENCOUNTER — Ambulatory Visit (INDEPENDENT_AMBULATORY_CARE_PROVIDER_SITE_OTHER): Payer: BC Managed Care – PPO | Admitting: Family Medicine

## 2013-03-07 ENCOUNTER — Encounter: Payer: Self-pay | Admitting: Family Medicine

## 2013-03-07 ENCOUNTER — Other Ambulatory Visit: Payer: Self-pay | Admitting: Family Medicine

## 2013-03-07 VITALS — BP 120/78 | HR 64 | Ht 70.0 in | Wt 240.0 lb

## 2013-03-07 DIAGNOSIS — Z862 Personal history of diseases of the blood and blood-forming organs and certain disorders involving the immune mechanism: Secondary | ICD-10-CM

## 2013-03-07 DIAGNOSIS — Z125 Encounter for screening for malignant neoplasm of prostate: Secondary | ICD-10-CM

## 2013-03-07 DIAGNOSIS — G4730 Sleep apnea, unspecified: Secondary | ICD-10-CM

## 2013-03-07 DIAGNOSIS — E785 Hyperlipidemia, unspecified: Secondary | ICD-10-CM

## 2013-03-07 DIAGNOSIS — E669 Obesity, unspecified: Secondary | ICD-10-CM

## 2013-03-07 DIAGNOSIS — N529 Male erectile dysfunction, unspecified: Secondary | ICD-10-CM

## 2013-03-07 DIAGNOSIS — E291 Testicular hypofunction: Secondary | ICD-10-CM

## 2013-03-07 DIAGNOSIS — I10 Essential (primary) hypertension: Secondary | ICD-10-CM

## 2013-03-07 DIAGNOSIS — Z0000 Encounter for general adult medical examination without abnormal findings: Secondary | ICD-10-CM

## 2013-03-07 DIAGNOSIS — Z8639 Personal history of other endocrine, nutritional and metabolic disease: Secondary | ICD-10-CM

## 2013-03-07 LAB — POCT URINALYSIS DIPSTICK
Ketones, UA: NEGATIVE
Leukocytes, UA: NEGATIVE
Urobilinogen, UA: NEGATIVE
pH, UA: 7

## 2013-03-07 LAB — CBC WITH DIFFERENTIAL/PLATELET
Basophils Absolute: 0 10*3/uL (ref 0.0–0.1)
Basophils Relative: 1 % (ref 0–1)
Eosinophils Relative: 5 % (ref 0–5)
HCT: 44.9 % (ref 39.0–52.0)
MCHC: 34.1 g/dL (ref 30.0–36.0)
MCV: 86.5 fL (ref 78.0–100.0)
Monocytes Absolute: 0.6 10*3/uL (ref 0.1–1.0)
Neutro Abs: 4.3 10*3/uL (ref 1.7–7.7)
Platelets: 177 10*3/uL (ref 150–400)
RDW: 13.4 % (ref 11.5–15.5)

## 2013-03-07 LAB — COMPREHENSIVE METABOLIC PANEL
AST: 20 U/L (ref 0–37)
Alkaline Phosphatase: 53 U/L (ref 39–117)
BUN: 19 mg/dL (ref 6–23)
Creat: 0.89 mg/dL (ref 0.50–1.35)

## 2013-03-07 LAB — HEMOCCULT GUIAC POC 1CARD (OFFICE)

## 2013-03-07 LAB — LIPID PANEL
HDL: 42 mg/dL (ref >39)
LDL Cholesterol: 86 mg/dL (ref 0–99)
Total CHOL/HDL Ratio: 3.5 Ratio
VLDL: 20 mg/dL (ref 0–40)

## 2013-03-07 MED ORDER — CARVEDILOL 25 MG PO TABS: 25.0000 mg | ORAL_TABLET | Freq: Two times a day (BID) | ORAL | Status: DC

## 2013-03-07 MED ORDER — TESTOSTERONE 4 MG/24HR TD PT24: 1.0000 | MEDICATED_PATCH | Freq: Every day | TRANSDERMAL | Status: DC

## 2013-03-07 MED ORDER — OLMESARTAN-AMLODIPINE-HCTZ 40-5-12.5 MG PO TABS: 12.5000 mg | ORAL_TABLET | ORAL | Status: DC

## 2013-03-07 MED ORDER — EZETIMIBE-SIMVASTATIN 10-20 MG PO TABS: 1.0000 | ORAL_TABLET | Freq: Every day | ORAL | Status: DC

## 2013-03-07 MED ORDER — VARDENAFIL HCL 20 MG PO TABS: 20.0000 mg | ORAL_TABLET | ORAL | Status: DC | PRN

## 2013-03-07 NOTE — Progress Notes (Signed)
Subjective:    Patient ID: [name] [name], male    DOB: [birth year], 62 y.o.   MRN: [id number]  HPI He is here for complete examination. He does have underlying hypertension and she is on medications listed in the chart. He continues on his CPAP. He has not had a read out on this in several years. He has lost a little of weight since last being seen. He apparently has made some dietary and exercise changes. He continues on his Androderm. He was supposed to return and been on a higher dose of the medication however he did not. He continues on Vytorin for his hyperlipidemia. Continues to use Levitra for his ADD. He has a remote history of heart or parathyroid is him and apparently had a parathyroidectomy. This was several years ago. His immunizations, family and social history were reviewed. He has no other particular concerns or complaints. He does exercise fairly regularly.  Review of Systems Negative except as above    Objective:   Physical Exam BP 120/78  Pulse 64  Ht 5\' 10"  (1.778 m)  Wt 240 lb (108.863 kg)  BMI 34.44 kg/m2  General Appearance:    Alert, cooperative, no distress, appears stated age  Head:    Normocephalic, without obvious abnormality, atraumatic  Eyes:    PERRL, conjunctiva/corneas clear, EOM's intact, fundi    benign  Ears:    Normal TM's and external ear canals  Nose:   Nares normal, mucosa normal, no drainage or sinus   tenderness  Throat:   Lips, mucosa, and tongue normal; teeth and gums normal  Neck:   Supple, no lymphadenopathy;  thyroid:  no   enlargement/tenderness/nodules; no carotid   bruit or JVD  Back:    Spine nontender, no curvature, ROM normal, no CVA     tenderness  Lungs:     Clear to auscultation bilaterally without wheezes, rales or     ronchi; respirations unlabored  Chest Wall:    No tenderness or deformity   Heart:    Regular rate and rhythm, S1 and S2 normal, no murmur, rub   or gallop  Breast Exam:    No chest wall tenderness, masses or  gynecomastia  Abdomen:     Soft, non-tender, nondistended, normoactive bowel sounds,    no masses, no hepatosplenomegaly     Rectal:    Normal sphincter tone, no masses or tenderness; guaiac negative stool.  Prostate smooth, no nodules, not enlarged.  Extremities:   No clubbing, cyanosis or edema  Pulses:   2+ and symmetric all extremities  Skin:   Skin color, texture, turgor normal, no rashes or lesions  Lymph nodes:   Cervical, supraclavicular, and axillary nodes normal  Neurologic:   CNII-XII intact, normal strength, sensation and gait; reflexes 2+ and symmetric throughout          Psych:   Normal mood, affect, hygiene and grooming.          Assessment & Plan:  Hypertension - Plan: POCT Urinalysis Dipstick, CBC with Differential, Comprehensive metabolic panel, carvedilol (COREG) 25 MG tablet, Olmesartan-Amlodipine-HCTZ (TRIBENZOR) 40-5-12.5 MG TABS  Sleep apnea  Obesity (BMI 30-39.9) - Plan: CBC with Differential, Comprehensive metabolic panel, Lipid panel  Hypogonadism male - Plan: Testosterone, testosterone (ANDRODERM) 4 MG/24HR PT24 patch  Hyperlipidemia LDL goal < 100 - Plan: Lipid panel, ezetimibe-simvastatin (VYTORIN) 10-20 MG per tablet  ED (erectile dysfunction) - Plan: vardenafil (LEVITRA) 20 MG tablet  History of hyperparathyroidism - Plan: CBC with Differential, Comprehensive  metabolic panel  Special screening for malignant neoplasm of prostate - Plan: PSA  Routine general medical examination at a health care facility - Plan: CBC with Differential, Comprehensive metabolic panel, Lipid panel, Testosterone, PSA, Hemoccult - 1 Card (office)  I will get a CPAP readout. Routine blood screening as mentioned above. Encouraged him to become more physically active or make further changes in his dietary regimen.

## 2013-03-08 LAB — TESTOSTERONE: Testosterone: 282 ng/dL — ABNORMAL LOW (ref 300–890)

## 2013-03-08 LAB — HEMOGLOBIN A1C: Mean Plasma Glucose: 128 mg/dL — ABNORMAL HIGH (ref <117)

## 2013-05-24 ENCOUNTER — Telehealth: Payer: Self-pay

## 2013-05-24 DIAGNOSIS — E291 Testicular hypofunction: Secondary | ICD-10-CM

## 2013-05-24 MED ORDER — TESTOSTERONE 4 MG/24HR TD PT24
1.0000 | MEDICATED_PATCH | Freq: Every day | TRANSDERMAL | Status: DC
Start: 2013-05-24 — End: 2013-06-14

## 2013-05-24 NOTE — Addendum Note (Signed)
Addended by: Clyde Lundborg A on: 05/24/2013 02:29 PM   Modules accepted: Orders

## 2013-05-24 NOTE — Telephone Encounter (Signed)
Okay to renew but have him come in in the next month for a recheck.

## 2013-05-24 NOTE — Telephone Encounter (Signed)
Pt called stating that he needs Andgroderm refilled. Is this ok to refill?

## 2013-05-24 NOTE — Telephone Encounter (Signed)
LM for pt TCB to schedule med check

## 2013-05-26 NOTE — Telephone Encounter (Signed)
Called pt back again and LMTCB regarding this medication and OV

## 2013-06-07 ENCOUNTER — Other Ambulatory Visit: Payer: Self-pay | Admitting: Family Medicine

## 2013-06-14 ENCOUNTER — Other Ambulatory Visit: Payer: Self-pay

## 2013-06-14 DIAGNOSIS — E291 Testicular hypofunction: Secondary | ICD-10-CM

## 2013-06-14 MED ORDER — TESTOSTERONE 4 MG/24HR TD PT24
1.0000 | MEDICATED_PATCH | Freq: Every day | TRANSDERMAL | Status: DC
Start: 2013-06-14 — End: 2013-08-19

## 2013-06-14 NOTE — Telephone Encounter (Signed)
Medication faxed

## 2013-06-15 ENCOUNTER — Other Ambulatory Visit: Payer: Self-pay | Admitting: Family Medicine

## 2013-06-15 NOTE — Progress Notes (Signed)
His testosterone was low and needs to be followed up on. Also his blood sugar was elevated. And hemoglobin A1c was not ordered. That will need to be followed up on as well.

## 2013-06-16 ENCOUNTER — Telehealth: Payer: Self-pay | Admitting: Family Medicine

## 2013-06-16 NOTE — Telephone Encounter (Signed)
Called pt to set up appt to reck testosterone & Aic, pt states he never go the Androderm in November, so he wants to start it & wait a month for reck.  Bevelyn Ngo calling in [facility].

## 2013-06-20 ENCOUNTER — Telehealth: Payer: Self-pay | Admitting: Family Medicine

## 2013-06-20 NOTE — Telephone Encounter (Signed)
P.A. Renae Gloss approved til 06/20/14, left message for pt

## 2013-06-20 NOTE — Telephone Encounter (Signed)
Medication called into [facility]

## 2013-08-18 ENCOUNTER — Encounter: Payer: Self-pay | Admitting: Family Medicine

## 2013-08-18 ENCOUNTER — Ambulatory Visit (INDEPENDENT_AMBULATORY_CARE_PROVIDER_SITE_OTHER): Payer: BC Managed Care – PPO | Admitting: Family Medicine

## 2013-08-18 VITALS — BP 150/70 | HR 52 | Wt 244.0 lb

## 2013-08-18 DIAGNOSIS — E291 Testicular hypofunction: Secondary | ICD-10-CM

## 2013-08-18 DIAGNOSIS — R7309 Other abnormal glucose: Secondary | ICD-10-CM

## 2013-08-18 DIAGNOSIS — E669 Obesity, unspecified: Secondary | ICD-10-CM

## 2013-08-18 DIAGNOSIS — R7302 Impaired glucose tolerance (oral): Secondary | ICD-10-CM

## 2013-08-18 DIAGNOSIS — Z79899 Other long term (current) drug therapy: Secondary | ICD-10-CM

## 2013-08-18 LAB — POCT GLYCOSYLATED HEMOGLOBIN (HGB A1C): Hemoglobin A1C: 5.7

## 2013-08-18 NOTE — Progress Notes (Signed)
   Subjective:    Patient ID: [name] [name], male    DOB: 1950-06-15, 63 y.o.   MRN: [id number]  HPI He is here for an interval visit. In December blood work did show a low testosterone however he was not using any testosterone. Now he has been on topical testosterone for approximately 45 days.. Also his hemoglobin A1c was 6.1. He is here for followup of both of these.   Review of Systems     Objective:   Physical Exam Alert and in no distress otherwise not examined. Hemoglobin A1c is 5.7.       Assessment & Plan:  Hypogonadism male - Plan: Testosterone  Obesity (BMI 30-39.9)  Glucose intolerance (impaired glucose tolerance) - Plan: HgB A1c  Encounter for long-term (current) use of other medications  I discussed in detail glucose intolerance and hemoglobin A1c. Explained that he was in the gray zone of being glucose intolerant but not truly diabetic but definitely heading in that direction. Explained the importance of diet and exercise to help with this. He expressed understanding of this and plans to make some dietary changes well as increase his physical activities. I will call him back when I get the testosterone.

## 2013-08-18 NOTE — Patient Instructions (Signed)
Check with Dr. Oletta Lamas' office concerning colonoscopy

## 2013-08-19 LAB — TESTOSTERONE: TESTOSTERONE: 310 ng/dL (ref 300–890)

## 2013-08-19 MED ORDER — TESTOSTERONE 4 MG/24HR TD PT24: 1.0000 | MEDICATED_PATCH | Freq: Every day | TRANSDERMAL | Status: DC

## 2013-08-19 MED ORDER — TESTOSTERONE 2.5 MG/24HR TD PT24: 1.0000 | MEDICATED_PATCH | Freq: Every day | TRANSDERMAL | Status: DC

## 2013-08-19 NOTE — Progress Notes (Signed)
   Subjective:    Patient ID: [name] [name], male    DOB: 1951/02/21, 63 y.o.   MRN: [id number]  HPI    Review of Systems     Objective:   Physical Exam        Assessment & Plan:  Testosterone is not at goal.I will add another patch and recheck in one month

## 2013-08-19 NOTE — Addendum Note (Signed)
Addended by: Denita Lung on: 08/19/2013 07:54 AM   Modules accepted: Orders

## 2013-09-20 ENCOUNTER — Telehealth: Payer: Self-pay

## 2013-09-20 DIAGNOSIS — E291 Testicular hypofunction: Secondary | ICD-10-CM

## 2013-09-20 MED ORDER — TESTOSTERONE 2.5 MG/24HR TD PT24: 1.0000 | MEDICATED_PATCH | Freq: Every day | TRANSDERMAL | Status: DC

## 2013-09-20 MED ORDER — TESTOSTERONE 4 MG/24HR TD PT24: 1.0000 | MEDICATED_PATCH | Freq: Every day | TRANSDERMAL | Status: DC

## 2013-09-20 NOTE — Telephone Encounter (Signed)
DR.LALONDE [name] CALLED HE WAS SO CONFUSED ON THE TESTOSTERONE PATCH HE DID NOT UNDERSTAND WHAT TO DO SO HE HAS BEEN PUTTING 2) 4MG  PATCHES ON A DAY HE SAID HE NEVER GOT THE 2.5 MG AND [facility] NO ORDER FOR ONE I TOLD HIM TO COME IN 09/26/13 TO HAVE HIS TESTOSTERONE CHECK AND THEN THAT WOULD GIVE TIME FOR HIS 2.5 AND 4 MG TO GET TO HIM JUST WANTED YOU TO KNOW

## 2013-09-22 ENCOUNTER — Telehealth: Payer: Self-pay | Admitting: Internal Medicine

## 2013-09-22 NOTE — Telephone Encounter (Signed)
Fax from androderm transdrm sys 2.5mg  is no longer manufactured. They have discontinued the androderm 2.5mg  patch and they have replaced it with androderm 2mg  patch.

## 2013-09-22 NOTE — Telephone Encounter (Signed)
FYI

## 2013-09-26 ENCOUNTER — Other Ambulatory Visit: Payer: Self-pay

## 2013-09-27 ENCOUNTER — Other Ambulatory Visit: Payer: BC Managed Care – PPO

## 2013-09-27 DIAGNOSIS — R7989 Other specified abnormal findings of blood chemistry: Secondary | ICD-10-CM

## 2013-09-28 LAB — TESTOSTERONE: Testosterone: 360 ng/dL (ref 300–890)

## 2013-10-06 ENCOUNTER — Telehealth: Payer: Self-pay | Admitting: Internal Medicine

## 2013-10-06 NOTE — Telephone Encounter (Signed)
Call in Androgel, 2 pumps daily and recheck in one month

## 2013-10-06 NOTE — Telephone Encounter (Signed)
Pt states he called his insurance to find out what his insurance would pay for. They will pay for androgel 1.62%, axiron, striant tablet, testosterone injection. [facility]. Pt knows you will be in Monday so it will be done Monday when you return

## 2013-10-10 ENCOUNTER — Other Ambulatory Visit: Payer: Self-pay

## 2013-10-10 MED ORDER — TESTOSTERONE 40.5 MG/2.5GM (1.62%) TD GEL: 2.0000 | Freq: Every day | TRANSDERMAL | Status: DC

## 2013-10-10 NOTE — Telephone Encounter (Signed)
ORDERED ANDROGEL 1.62% PER JCL

## 2013-12-29 ENCOUNTER — Other Ambulatory Visit (INDEPENDENT_AMBULATORY_CARE_PROVIDER_SITE_OTHER): Payer: BC Managed Care – PPO

## 2013-12-29 DIAGNOSIS — Z23 Encounter for immunization: Secondary | ICD-10-CM

## 2013-12-30 ENCOUNTER — Other Ambulatory Visit: Payer: Self-pay | Admitting: Family Medicine

## 2013-12-30 NOTE — Telephone Encounter (Signed)
Is this okay to fill? 

## 2014-01-05 ENCOUNTER — Telehealth: Payer: Self-pay | Admitting: Family Medicine

## 2014-01-06 NOTE — Telephone Encounter (Signed)
P.A. Approved Levitra til 01/04/17 left message for pt

## 2014-03-08 ENCOUNTER — Encounter: Payer: Self-pay | Admitting: Family Medicine

## 2014-03-08 ENCOUNTER — Ambulatory Visit (INDEPENDENT_AMBULATORY_CARE_PROVIDER_SITE_OTHER): Payer: BC Managed Care – PPO | Admitting: Family Medicine

## 2014-03-08 ENCOUNTER — Telehealth: Payer: Self-pay | Admitting: Family Medicine

## 2014-03-08 ENCOUNTER — Other Ambulatory Visit: Payer: Self-pay | Admitting: Family Medicine

## 2014-03-08 VITALS — BP 150/84 | HR 72 | Ht 70.5 in | Wt 247.0 lb

## 2014-03-08 DIAGNOSIS — E785 Hyperlipidemia, unspecified: Secondary | ICD-10-CM

## 2014-03-08 DIAGNOSIS — E291 Testicular hypofunction: Secondary | ICD-10-CM

## 2014-03-08 DIAGNOSIS — Z0000 Encounter for general adult medical examination without abnormal findings: Secondary | ICD-10-CM

## 2014-03-08 DIAGNOSIS — G4730 Sleep apnea, unspecified: Secondary | ICD-10-CM

## 2014-03-08 DIAGNOSIS — Z8639 Personal history of other endocrine, nutritional and metabolic disease: Secondary | ICD-10-CM

## 2014-03-08 DIAGNOSIS — I10 Essential (primary) hypertension: Secondary | ICD-10-CM

## 2014-03-08 DIAGNOSIS — E669 Obesity, unspecified: Secondary | ICD-10-CM

## 2014-03-08 DIAGNOSIS — N529 Male erectile dysfunction, unspecified: Secondary | ICD-10-CM

## 2014-03-08 LAB — CBC WITH DIFFERENTIAL/PLATELET
BASOS PCT: 1 % (ref 0–1)
Basophils Absolute: 0.1 10*3/uL (ref 0.0–0.1)
EOS PCT: 5 % (ref 0–5)
Eosinophils Absolute: 0.3 10*3/uL (ref 0.0–0.7)
HCT: 42 % (ref 39.0–52.0)
Hemoglobin: 15.1 g/dL (ref 13.0–17.0)
LYMPHS PCT: 20 % (ref 12–46)
Lymphs Abs: 1.1 10*3/uL (ref 0.7–4.0)
MCH: 30.1 pg (ref 26.0–34.0)
MCHC: 36 g/dL (ref 30.0–36.0)
MCV: 83.7 fL (ref 78.0–100.0)
MONO ABS: 0.4 10*3/uL (ref 0.1–1.0)
MPV: 11.9 fL (ref 9.4–12.4)
Monocytes Relative: 7 % (ref 3–12)
Neutro Abs: 3.8 10*3/uL (ref 1.7–7.7)
Neutrophils Relative %: 67 % (ref 43–77)
PLATELETS: 166 10*3/uL (ref 150–400)
RBC: 5.02 MIL/uL (ref 4.22–5.81)
RDW: 13.2 % (ref 11.5–15.5)
WBC: 5.7 10*3/uL (ref 4.0–10.5)

## 2014-03-08 LAB — LIPID PANEL
Cholesterol: 145 mg/dL (ref 0–200)
HDL: 40 mg/dL (ref >39)
LDL Cholesterol: 74 mg/dL (ref 0–99)
TRIGLYCERIDES: 154 mg/dL — AB (ref <150)
Total CHOL/HDL Ratio: 3.6 Ratio
VLDL: 31 mg/dL (ref 0–40)

## 2014-03-08 LAB — POCT URINALYSIS DIPSTICK
BILIRUBIN UA: NEGATIVE
Blood, UA: NEGATIVE
GLUCOSE UA: NEGATIVE
Ketones, UA: NEGATIVE
LEUKOCYTES UA: NEGATIVE
NITRITE UA: NEGATIVE
Protein, UA: NEGATIVE
Spec Grav, UA: 1.015
Urobilinogen, UA: NEGATIVE
pH, UA: 6.5

## 2014-03-08 LAB — COMPREHENSIVE METABOLIC PANEL
ALT: 27 U/L (ref 0–53)
AST: 28 U/L (ref 0–37)
Albumin: 4.3 g/dL (ref 3.5–5.2)
Alkaline Phosphatase: 46 U/L (ref 39–117)
BUN: 13 mg/dL (ref 6–23)
CALCIUM: 9.1 mg/dL (ref 8.4–10.5)
CHLORIDE: 103 meq/L (ref 96–112)
CO2: 28 mEq/L (ref 19–32)
CREATININE: 0.93 mg/dL (ref 0.50–1.35)
Glucose, Bld: 145 mg/dL — ABNORMAL HIGH (ref 70–99)
Potassium: 3.2 mEq/L — ABNORMAL LOW (ref 3.5–5.3)
SODIUM: 141 meq/L (ref 135–145)
TOTAL PROTEIN: 6.5 g/dL (ref 6.0–8.3)
Total Bilirubin: 2.4 mg/dL — ABNORMAL HIGH (ref 0.2–1.2)

## 2014-03-08 MED ORDER — CARVEDILOL 25 MG PO TABS: 25.0000 mg | ORAL_TABLET | Freq: Two times a day (BID) | ORAL | Status: DC

## 2014-03-08 MED ORDER — TESTOSTERONE 40.5 MG/2.5GM (1.62%) TD GEL: 2.0000 | Freq: Every day | TRANSDERMAL | Status: DC

## 2014-03-08 MED ORDER — OLMESARTAN-AMLODIPINE-HCTZ 40-5-12.5 MG PO TABS: ORAL_TABLET | ORAL | Status: DC

## 2014-03-08 MED ORDER — SILDENAFIL CITRATE 20 MG PO TABS
ORAL_TABLET | ORAL | Status: DC
Start: 2014-03-08 — End: 2015-03-29

## 2014-03-08 MED ORDER — EZETIMIBE-SIMVASTATIN 10-20 MG PO TABS: 1.0000 | ORAL_TABLET | Freq: Every day | ORAL | Status: DC

## 2014-03-08 NOTE — Telephone Encounter (Signed)
Called Huey Romans t/w Lonn [place] [phone]  She will obtain and fax cpap readout to [place]

## 2014-03-08 NOTE — Progress Notes (Signed)
Subjective:    Patient ID: [name] [name], male    DOB: September 29, 1950, 63 y.o.   MRN: [id number]  HPI He is here for complete examination. He retired in June of this year. 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 He has seen his dermatologist recently and did have several actinic keratoses frozen. He has been using Levitra but would like to switch to 20 mg sildenafil to reduce cost. He has a previous history of hyperparathyroid disease with subsequent surgery. He continues on Vytorin for his hyperlipidemia. He also is on 2 medications for his blood pressure. Apparently testosterone replacement is working well. He does have sleep apnea and is on CPAP machine. Family and social history were reviewed.  Review of Systems  All other systems reviewed and are negative.      Objective:   Physical Exam BP 150/84 mmHg  Pulse 72  Ht 5' 10.5" (1.791 m)  Wt 247 lb (112.038 kg)  BMI 34.93 kg/m2  General Appearance:    Alert, cooperative, no distress, appears stated age  Head:    Normocephalic, without obvious abnormality, atraumatic  Eyes:    PERRL, conjunctiva/corneas clear, EOM's intact, fundi    benign  Ears:    Normal TM's and external ear canals  Nose:   Nares normal, mucosa normal, no drainage or sinus   tenderness  Throat:   Lips, mucosa, and tongue normal; teeth and gums normal  Neck:   Supple, no lymphadenopathy;  thyroid:  no   enlargement/tenderness/nodules; no carotid   bruit or JVD  Back:    Spine nontender, no curvature, ROM normal, no CVA     tenderness  Lungs:     Clear to auscultation bilaterally without wheezes, rales or     ronchi; respirations unlabored  Chest Wall:    No tenderness or deformity   Heart:    Regular rate and rhythm, S1 and S2 normal, no murmur, rub   or gallop  Breast Exam:    No chest wall tenderness, masses or gynecomastia  Abdomen:     Soft, non-tender, nondistended, normoactive bowel sounds,    no masses, no hepatosplenomegaly          Extremities:   No clubbing, cyanosis or edema  Pulses:   2+ and symmetric all extremities  Skin:   Skin color, texture, turgor normal, no rashes or lesions  Lymph nodes:   Cervical, supraclavicular, and axillary nodes normal  Neurologic:   CNII-XII intact, normal strength, sensation and gait; reflexes 2+ and symmetric throughout          Psych:   Normal mood, affect, hygiene and grooming.          Assessment & Plan:  Routine general medical examination at a health care facility - Plan: CBC with Differential, Comprehensive metabolic panel, Lipid panel, Urinalysis Dipstick  Erectile dysfunction, unspecified erectile dysfunction type - Plan: sildenafil (REVATIO) 20 MG tablet  History of hyperparathyroidism  Hyperlipidemia with target LDL less than 100 - Plan: Lipid panel, ezetimibe-simvastatin (VYTORIN) 10-20 MG per tablet  Essential hypertension - Plan: Olmesartan-Amlodipine-HCTZ (TRIBENZOR) 40-5-12.5 MG TABS, carvedilol (COREG) 25 MG tablet  Hypogonadism male - Plan: Testosterone, PSA, Testosterone (ANDROGEL) 40.5 MG/2.5GM (1.62%) GEL  Obesity (BMI 30-39.9) - Plan: CBC with Differential, Comprehensive metabolic panel, Lipid panel  Sleep apnea  he will be switched to sildenafil. His medications were renewed. Discussed diet and exercise with him in regard to the diseases that he has. I explained that  he could reduce if not eliminate many of these medications with weight loss. Recommend he set a goal waist size of roughly 36.

## 2014-03-08 NOTE — Patient Instructions (Signed)
Cut Back on [employer]

## 2014-03-09 LAB — PSA: PSA: 1.9 ng/mL (ref <=4.00)

## 2014-03-09 LAB — TESTOSTERONE: Testosterone: 640 ng/dL (ref 300–890)

## 2014-03-13 ENCOUNTER — Other Ambulatory Visit: Payer: Self-pay | Admitting: Medical

## 2014-03-13 LAB — HEMOGLOBIN A1C
Hgb A1c MFr Bld: 6.2 % — ABNORMAL HIGH (ref <5.7)
Mean Plasma Glucose: 131 mg/dL — ABNORMAL HIGH (ref <117)

## 2014-03-14 ENCOUNTER — Other Ambulatory Visit: Payer: Self-pay

## 2014-03-14 MED ORDER — EPINEPHRINE 0.3 MG/0.3ML IJ DEVI: 0.3000 mg | Freq: Once | INTRAMUSCULAR | Status: DC

## 2014-03-20 ENCOUNTER — Encounter: Payer: Self-pay | Admitting: Family Medicine

## 2014-03-20 ENCOUNTER — Other Ambulatory Visit: Payer: Self-pay

## 2014-03-27 ENCOUNTER — Encounter: Payer: Self-pay | Admitting: Family Medicine

## 2014-03-28 ENCOUNTER — Other Ambulatory Visit: Payer: Self-pay

## 2014-03-28 MED ORDER — AMLODIPINE BESYLATE 5 MG PO TABS: 5.0000 mg | ORAL_TABLET | Freq: Every day | ORAL | Status: DC

## 2014-03-28 MED ORDER — SIMVASTATIN 20 MG PO TABS: 20.0000 mg | ORAL_TABLET | Freq: Every day | ORAL | Status: DC

## 2014-03-28 MED ORDER — VALSARTAN-HYDROCHLOROTHIAZIDE 160-12.5 MG PO TABS: 1.0000 | ORAL_TABLET | Freq: Every day | ORAL | Status: DC

## 2014-03-28 NOTE — Telephone Encounter (Signed)
His medications were switched due to insurance issues. He is to return here in 2 months for recheck.

## 2014-04-15 ENCOUNTER — Other Ambulatory Visit: Payer: Self-pay | Admitting: Family Medicine

## 2014-04-24 ENCOUNTER — Encounter: Payer: Self-pay | Admitting: Family Medicine

## 2014-04-25 ENCOUNTER — Telehealth: Payer: Self-pay | Admitting: Family Medicine

## 2014-04-26 NOTE — Telephone Encounter (Signed)
Recv'd fax was denied so called [place]. Androgel was approved case PA # [id number].  Wife informed.

## 2014-05-24 ENCOUNTER — Encounter: Payer: Self-pay | Admitting: Family Medicine

## 2014-10-23 ENCOUNTER — Telehealth: Payer: Self-pay | Admitting: Family Medicine

## 2014-10-23 ENCOUNTER — Other Ambulatory Visit: Payer: Self-pay

## 2014-10-23 DIAGNOSIS — E291 Testicular hypofunction: Secondary | ICD-10-CM

## 2014-10-23 MED ORDER — TESTOSTERONE 40.5 MG/2.5GM (1.62%) TD GEL: 2.0000 | Freq: Every day | TRANSDERMAL | Status: DC

## 2014-10-23 NOTE — Telephone Encounter (Signed)
Rcvd refill request for Androgel 1.62% #75

## 2014-10-23 NOTE — Telephone Encounter (Signed)
Called pt left message for pt to make an appointment per jcl and also called in androgel

## 2014-10-23 NOTE — Telephone Encounter (Signed)
He needs a visit but don't let him run out

## 2014-11-07 ENCOUNTER — Encounter: Payer: Self-pay | Admitting: Family Medicine

## 2014-11-07 ENCOUNTER — Ambulatory Visit (INDEPENDENT_AMBULATORY_CARE_PROVIDER_SITE_OTHER): Payer: 59 | Admitting: Family Medicine

## 2014-11-07 VITALS — BP 142/86 | HR 60 | Ht 71.0 in | Wt 240.0 lb

## 2014-11-07 DIAGNOSIS — I10 Essential (primary) hypertension: Secondary | ICD-10-CM

## 2014-11-07 DIAGNOSIS — E785 Hyperlipidemia, unspecified: Secondary | ICD-10-CM | POA: Diagnosis not present

## 2014-11-07 DIAGNOSIS — E291 Testicular hypofunction: Secondary | ICD-10-CM | POA: Diagnosis not present

## 2014-11-07 DIAGNOSIS — E669 Obesity, unspecified: Secondary | ICD-10-CM

## 2014-11-07 DIAGNOSIS — R17 Unspecified jaundice: Secondary | ICD-10-CM

## 2014-11-07 DIAGNOSIS — Z23 Encounter for immunization: Secondary | ICD-10-CM

## 2014-11-07 DIAGNOSIS — R7302 Impaired glucose tolerance (oral): Secondary | ICD-10-CM

## 2014-11-07 LAB — POCT GLYCOSYLATED HEMOGLOBIN (HGB A1C): HEMOGLOBIN A1C: 5.8

## 2014-11-07 LAB — LIPID PANEL
CHOL/HDL RATIO: 4.6 ratio (ref <=5.0)
CHOLESTEROL: 187 mg/dL (ref 125–200)
HDL: 41 mg/dL (ref >=40)
LDL Cholesterol: 120 mg/dL (ref <130)
Triglycerides: 132 mg/dL (ref <150)
VLDL: 26 mg/dL (ref <30)

## 2014-11-07 LAB — COMPREHENSIVE METABOLIC PANEL
ALK PHOS: 51 U/L (ref 40–115)
ALT: 25 U/L (ref 9–46)
AST: 26 U/L (ref 10–35)
Albumin: 4.3 g/dL (ref 3.6–5.1)
BUN: 14 mg/dL (ref 7–25)
CO2: 29 mmol/L (ref 20–31)
CREATININE: 1 mg/dL (ref 0.70–1.25)
Calcium: 9.5 mg/dL (ref 8.6–10.3)
Chloride: 101 mmol/L (ref 98–110)
GLUCOSE: 127 mg/dL — AB (ref 65–99)
Potassium: 3.2 mmol/L — ABNORMAL LOW (ref 3.5–5.3)
Sodium: 144 mmol/L (ref 135–146)
TOTAL PROTEIN: 6.6 g/dL (ref 6.1–8.1)
Total Bilirubin: 2.4 mg/dL — ABNORMAL HIGH (ref 0.2–1.2)

## 2014-11-07 NOTE — Progress Notes (Signed)
   Subjective:    Patient ID: [name] [name], male    DOB: April 05, 1950, 64 y.o.   MRN: [id number]  HPI He is here for an interval evaluation. On his last visit, his statin was changed as well as his blood pressure medication. He did not start on the statin until approximately 2 months ago. He continues on testosterone and has had no difficulty with that. He does have a previous history of glucose intolerance. He states that he has made some life style changes in regard to diet and exercise.   Review of Systems     Objective:   Physical Exam Alert and in no distress. Tympanic membranes and canals are normal. Pharyngeal area is normal. Neck is supple without adenopathy or thyromegaly. Cardiac exam shows a regular sinus rhythm without murmurs or gallops. Lungs are clear to auscultation. Review of his record also indicates an elevated bilirubin on his last blood draw.       Assessment & Plan:  Hypogonadism male - Plan: Testosterone  Obesity (BMI 30-39.9) - Plan: Comprehensive metabolic panel, Lipid panel  Essential hypertension  Hyperlipidemia with target LDL less than 100 - Plan: Lipid panel  Need for prophylactic vaccination and inoculation against influenza - Plan: Flu Vaccine QUAD 36+ mos IM  Glucose intolerance (impaired glucose tolerance) - Plan: POCT glycosylated hemoglobin (Hb A1C)  Elevated bilirubin - Plan: Comprehensive metabolic panel Discussed the interaction between low testosterone, obesity, hypertension, hyperlipidemia and risk for diabetes with all of this. Explained that all these things are interrelated with lifestyle. Strongly encouraged him to continue to make changes in his lifestyle in regard to physical activity and eating habit specifically cutting back on carbohydrates.Also discussed risks and benefits of flu shot.

## 2014-11-07 NOTE — Patient Instructions (Signed)
Time to make further adjustments in your carbohydrates. 20 minutes of something physical daily

## 2014-11-08 LAB — TESTOSTERONE: TESTOSTERONE: 468 ng/dL (ref 300–890)

## 2014-12-25 ENCOUNTER — Telehealth: Payer: Self-pay

## 2014-12-25 NOTE — Telephone Encounter (Signed)
Refill request for Androgel 1.62% #75

## 2014-12-26 NOTE — Telephone Encounter (Signed)
Called in.

## 2014-12-26 NOTE — Telephone Encounter (Signed)
Call out Androgel month supply, no refill

## 2015-01-23 ENCOUNTER — Encounter: Payer: Self-pay | Admitting: Family Medicine

## 2015-02-02 ENCOUNTER — Encounter: Payer: Self-pay | Admitting: Family Medicine

## 2015-02-02 DIAGNOSIS — Z1211 Encounter for screening for malignant neoplasm of colon: Secondary | ICD-10-CM

## 2015-02-02 NOTE — Telephone Encounter (Signed)
So I went to submit his claim and it is not letting me maybe you can get it to work for you? Laurence Spates is the provider [id number] is the policy number

## 2015-02-02 NOTE — Telephone Encounter (Signed)
done

## 2015-02-15 ENCOUNTER — Telehealth: Payer: Self-pay

## 2015-02-15 DIAGNOSIS — E291 Testicular hypofunction: Secondary | ICD-10-CM

## 2015-02-15 NOTE — Telephone Encounter (Signed)
Refill request for Androgel 40.5mg /2.5gm #75g (1.62%)

## 2015-02-15 NOTE — Telephone Encounter (Signed)
Renew for three months

## 2015-02-16 MED ORDER — TESTOSTERONE 40.5 MG/2.5GM (1.62%) TD GEL: 2.0000 | Freq: Every day | TRANSDERMAL | Status: DC

## 2015-02-16 NOTE — Telephone Encounter (Signed)
Called in med for a 3 month supply at once with no refills. Pharmacy was not sure what the dispense was going to be but was going to make it for 3 months after he figured it up.

## 2015-03-14 LAB — HM COLONOSCOPY: HM COLON: NORMAL

## 2015-03-15 ENCOUNTER — Ambulatory Visit (INDEPENDENT_AMBULATORY_CARE_PROVIDER_SITE_OTHER): Payer: 59 | Admitting: Family Medicine

## 2015-03-15 ENCOUNTER — Ambulatory Visit
Admission: RE | Admit: 2015-03-15 | Discharge: 2015-03-15 | Disposition: A | Discharge status: Home / Self Care | Payer: 59 | Source: Ambulatory Visit | Attending: Family Medicine | Admitting: Family Medicine

## 2015-03-15 ENCOUNTER — Encounter: Payer: Self-pay | Admitting: Family Medicine

## 2015-03-15 VITALS — BP 170/90 | HR 68 | Wt 245.6 lb

## 2015-03-15 DIAGNOSIS — S99912A Unspecified injury of left ankle, initial encounter: Secondary | ICD-10-CM | POA: Diagnosis not present

## 2015-03-15 DIAGNOSIS — I10 Essential (primary) hypertension: Secondary | ICD-10-CM | POA: Diagnosis not present

## 2015-03-15 NOTE — Patient Instructions (Addendum)
Take 800 mg ibuprofen 3 times a day or aleve 2 pills twice daily for pain. Rest, ice and elevate. Depending on your x-ray result we may need to refer you or possibly prescribed a boot or splint.  Return in 2 weeks for blood pressure check. Also try to check her pressure at home and see what numbers you are getting.

## 2015-03-15 NOTE — Progress Notes (Signed)
   Subjective:    Patient ID: [name] [name], male    DOB: 1950/08/08, 64 y.o.   MRN: [id number]  HPI Chief Complaint  Patient presents with  . ankle injuried    was working on boat and stepped down and slipped. hurts to the touch and swollen   He is here with complaints of left ankle pain and swelling for past 2 days after sliding off a boat ladder and landing on his left foot while on an incline approximately 3 feet off the ground. He states he did not have pain initially and took several steps back into his house but then within 5 minutes then he started having ankle swelling and pain. Denies numbness, tingling. Has not taken anything for pain.  He states his blood pressure was elevated yesterday at his GI doctor, he was there for a colonoscopy. Today his pressure is elevated. He states he has been taking blood pressure medications daily.  He denies headache, dizziness, DOE, chest pain, palpitations.   Reviewed allergies, medications, past medical history.    Review of Systems Pertinent positives and negatives in the history of present illness.    Objective:   Physical Exam BP 170/90 mmHg  Pulse 68  Wt 245 lb 9.6 oz (111.403 kg)  Blood pressure recheck 168/88 in left arm sitting.  Alert and in no distress. Cardiac exam shows a regular sinus rhythm without murmurs or gallops. Lungs are clear to auscultation. Extremities: antalgic gait noted, left ankle with ecchymosis to the medial malleolus extending distally into foot. Marked edema, normal cap refill and pulse, limited ROM due to pain, significant limitation and pain with dorsiflexion and eversion. No step off to achillies, negative thompson test.      Assessment & Plan:  Essential hypertension  Ankle injury, left, initial encounter - Plan: DG Ankle Complete Left  Spoke with Dr. Redmond School regarding patient's blood pressure and agree with plan to have him return in 2 weeks for blood pressure check. Recommend that he also check his  blood pressure at home and let us know what numbers he is getting. Difficult to do a complete ankle exam due to swelling and pain. per [facility] rules, will send for XR and follow up accordingly. He will try taking 2 Aleve twice daily for the next 2-3 days. Discussed RICE for pain control.

## 2015-03-16 ENCOUNTER — Other Ambulatory Visit: Payer: Self-pay | Admitting: Family Medicine

## 2015-03-16 ENCOUNTER — Ambulatory Visit: Payer: 59 | Admitting: Family Medicine

## 2015-03-16 ENCOUNTER — Telehealth: Payer: Self-pay | Admitting: Family Medicine

## 2015-03-16 ENCOUNTER — Telehealth: Payer: Self-pay | Admitting: Internal Medicine

## 2015-03-16 DIAGNOSIS — S99912A Unspecified injury of left ankle, initial encounter: Secondary | ICD-10-CM

## 2015-03-16 MED ORDER — HYDROCODONE-ACETAMINOPHEN 5-325 MG PO TABS: 1.0000 | ORAL_TABLET | Freq: Four times a day (QID) | ORAL | Status: DC | PRN

## 2015-03-16 NOTE — Telephone Encounter (Signed)
Patient is scheduled with [facility] ortho today @ 1:30pm. Patient is aware. [address] [place], [place] [address] Phone # [phone]  Referral #- [id number]

## 2015-03-16 NOTE — Telephone Encounter (Signed)
Called and discussed with patient that he should come by and pick up prescription for walking boot and take it to [facility] supply or another medical supply store. I will leave written  of the prescription at the front desk patient for pain medication. He is also aware that I'm going to refer him to orthopedist due to possible fracture versus ligament damage to his left ankle.

## 2015-03-22 ENCOUNTER — Encounter: Payer: Self-pay | Admitting: Family Medicine

## 2015-03-29 ENCOUNTER — Encounter: Payer: Self-pay | Admitting: Family Medicine

## 2015-03-29 ENCOUNTER — Other Ambulatory Visit: Payer: Self-pay | Admitting: Family Medicine

## 2015-03-29 ENCOUNTER — Ambulatory Visit (INDEPENDENT_AMBULATORY_CARE_PROVIDER_SITE_OTHER): Payer: BLUE CROSS/BLUE SHIELD | Admitting: Family Medicine

## 2015-03-29 VITALS — BP 148/90 | HR 63 | Wt 245.6 lb

## 2015-03-29 DIAGNOSIS — S93402D Sprain of unspecified ligament of left ankle, subsequent encounter: Secondary | ICD-10-CM

## 2015-03-29 DIAGNOSIS — I10 Essential (primary) hypertension: Secondary | ICD-10-CM | POA: Diagnosis not present

## 2015-03-29 MED ORDER — AZILSARTAN-CHLORTHALIDONE 40-25 MG PO TABS: 1.0000 | ORAL_TABLET | Freq: Every day | ORAL | Status: DC

## 2015-03-29 NOTE — Progress Notes (Signed)
   Subjective:    Patient ID: [name] [name], male    DOB: 1951/03/05, 65 y.o.   MRN: [id number]  HPI He is here for blood pressure recheck. Presently he is on Coreg as well as lisinopril. Recent blood pressures do show that it is elevated. He also is recovering from ligament damage in his ankle as well as Achilles tendon. He does have a walking boot on.  Review of Systems     Objective:   Physical Exam Alert and in no distress. Blood pressure is recorded      Assessment & Plan:  Essential hypertension - Plan: Azilsartan-Chlorthalidone 40-25 MG TABS  Sprain of ankle, left, subsequent encounter Since he has not really responded to the present medication regimen. I will switch him to [employer]. He will try to get this through his pharmacy and if any problems, refer to [facility]. He is comfortable with this. He will have this checked urinalysis and naproxen one month.

## 2015-04-02 ENCOUNTER — Telehealth: Payer: Self-pay | Admitting: Family Medicine

## 2015-04-03 ENCOUNTER — Other Ambulatory Visit: Payer: Self-pay | Admitting: Family Medicine

## 2015-04-03 ENCOUNTER — Telehealth: Payer: Self-pay | Admitting: Family Medicine

## 2015-04-03 NOTE — Telephone Encounter (Signed)
Pt called back & states he wants to cancel the [facility] and go thru [facility] for the [employer].  I called it into [facility] for cash pay and cancelled the other.

## 2015-04-03 NOTE — Telephone Encounter (Signed)
P.A. Approved til 03/16/2018, called pharmacy & went thru for $168 due to pt's $200 deductible that has to be met due to this being name brand medication.  I called pt and explained should only cost this much for the first month.

## 2015-04-19 ENCOUNTER — Encounter: Payer: Self-pay | Admitting: Family Medicine

## 2015-04-19 ENCOUNTER — Ambulatory Visit (INDEPENDENT_AMBULATORY_CARE_PROVIDER_SITE_OTHER): Payer: BLUE CROSS/BLUE SHIELD | Admitting: Family Medicine

## 2015-04-19 ENCOUNTER — Other Ambulatory Visit: Payer: Self-pay | Admitting: Family Medicine

## 2015-04-19 VITALS — BP 150/90 | HR 82 | Wt 239.0 lb

## 2015-04-19 DIAGNOSIS — N529 Male erectile dysfunction, unspecified: Secondary | ICD-10-CM | POA: Diagnosis not present

## 2015-04-19 DIAGNOSIS — G4730 Sleep apnea, unspecified: Secondary | ICD-10-CM | POA: Diagnosis not present

## 2015-04-19 DIAGNOSIS — E669 Obesity, unspecified: Secondary | ICD-10-CM | POA: Diagnosis not present

## 2015-04-19 DIAGNOSIS — Z1159 Encounter for screening for other viral diseases: Secondary | ICD-10-CM | POA: Diagnosis not present

## 2015-04-19 DIAGNOSIS — Z0000 Encounter for general adult medical examination without abnormal findings: Secondary | ICD-10-CM | POA: Diagnosis not present

## 2015-04-19 DIAGNOSIS — Z8639 Personal history of other endocrine, nutritional and metabolic disease: Secondary | ICD-10-CM | POA: Diagnosis not present

## 2015-04-19 DIAGNOSIS — L570 Actinic keratosis: Secondary | ICD-10-CM | POA: Diagnosis not present

## 2015-04-19 DIAGNOSIS — E785 Hyperlipidemia, unspecified: Secondary | ICD-10-CM

## 2015-04-19 DIAGNOSIS — J209 Acute bronchitis, unspecified: Secondary | ICD-10-CM

## 2015-04-19 DIAGNOSIS — E291 Testicular hypofunction: Secondary | ICD-10-CM | POA: Diagnosis not present

## 2015-04-19 DIAGNOSIS — I10 Essential (primary) hypertension: Secondary | ICD-10-CM | POA: Diagnosis not present

## 2015-04-19 LAB — COMPREHENSIVE METABOLIC PANEL
ALBUMIN: 4.3 g/dL (ref 3.6–5.1)
ALK PHOS: 58 U/L (ref 40–115)
ALT: 23 U/L (ref 9–46)
AST: 28 U/L (ref 10–35)
BILIRUBIN TOTAL: 1.4 mg/dL — AB (ref 0.2–1.2)
BUN: 16 mg/dL (ref 7–25)
CHLORIDE: 98 mmol/L (ref 98–110)
CO2: 30 mmol/L (ref 20–31)
Calcium: 9.4 mg/dL (ref 8.6–10.3)
Creat: 1.08 mg/dL (ref 0.70–1.25)
GLUCOSE: 139 mg/dL — AB (ref 65–99)
Potassium: 3 mmol/L — ABNORMAL LOW (ref 3.5–5.3)
SODIUM: 139 mmol/L (ref 135–146)
TOTAL PROTEIN: 6.7 g/dL (ref 6.1–8.1)

## 2015-04-19 LAB — CBC WITH DIFFERENTIAL/PLATELET
BASOS ABS: 0.1 10*3/uL (ref 0.0–0.1)
BASOS PCT: 1 % (ref 0–1)
EOS ABS: 0.5 10*3/uL (ref 0.0–0.7)
Eosinophils Relative: 8 % — ABNORMAL HIGH (ref 0–5)
HCT: 43.1 % (ref 39.0–52.0)
HEMOGLOBIN: 14.7 g/dL (ref 13.0–17.0)
LYMPHS ABS: 1.1 10*3/uL (ref 0.7–4.0)
Lymphocytes Relative: 17 % (ref 12–46)
MCH: 29.2 pg (ref 26.0–34.0)
MCHC: 34.1 g/dL (ref 30.0–36.0)
MCV: 85.7 fL (ref 78.0–100.0)
MONOS PCT: 11 % (ref 3–12)
MPV: 12.9 fL — AB (ref 8.6–12.4)
Monocytes Absolute: 0.7 10*3/uL (ref 0.1–1.0)
NEUTROS ABS: 4 10*3/uL (ref 1.7–7.7)
NEUTROS PCT: 63 % (ref 43–77)
PLATELETS: 188 10*3/uL (ref 150–400)
RBC: 5.03 MIL/uL (ref 4.22–5.81)
RDW: 12.7 % (ref 11.5–15.5)
WBC: 6.4 10*3/uL (ref 4.0–10.5)

## 2015-04-19 LAB — URIC ACID: URIC ACID, SERUM: 8.6 mg/dL — AB (ref 4.0–7.8)

## 2015-04-19 MED ORDER — SILDENAFIL CITRATE 100 MG PO TABS: 100.0000 mg | ORAL_TABLET | ORAL | Status: DC | PRN

## 2015-04-19 MED ORDER — AMOXICILLIN 875 MG PO TABS
875.0000 mg | ORAL_TABLET | Freq: Two times a day (BID) | ORAL | Status: DC
Start: 2015-04-19 — End: 2015-08-16

## 2015-04-19 NOTE — Patient Instructions (Signed)
Take all the antibiotic and call me if not totally back to normal

## 2015-04-19 NOTE — Progress Notes (Signed)
Subjective:    Patient ID: [name] [name], male    DOB: 1951/02/15, 65 y.o.   MRN: [id number]  HPI He is here for complete examination. He is now in the process of getting actinic keratosis treated by Dr. Allyson Sabal. He also is being followed by orthopedics for Achilles tendon partial rupture as well as foot sprain. He is in a cast from this. While in the cast she did note pain and swelling of his right great toe. It is fine he is having no difficulty with that. He has a previous history of OSA but has not had a new machine or read out in several years. He also is had a parathyroidectomy due to elevated calcium levels. This was several years ago. He also has started to lose some weight to help with his overall health and well-being he complains of a one-month history of intermittent difficulty with nasal congestion, rhinorrhea, cough, PND but no fever, chills or malaise. He continues on testosterone and would like a refill on his ED medication. He also continues on simvastatin and is having no myalgias from that. His work and home life are going well. Family, social history and health maintenance as well as immunizations were reviewed. He has no other concerns or complaints.   Review of Systems  All other systems reviewed and are negative.      Objective:   Physical Exam BP 150/90 mmHg  Pulse 82  Wt 239 lb (108.41 kg)  SpO2 95%  General Appearance:    Alert, cooperative, no distress, appears stated age  Head:    Normocephalic, without obvious abnormality, atraumatic  Eyes:    PERRL, conjunctiva/corneas clear, EOM's intact, fundi    benign  Ears:    Normal TM's and external ear canals  Nose:   Nares normal, mucosa normal, no drainage or sinus   tenderness  Throat:   Lips, mucosa, and tongue normal; teeth and gums normal  Neck:   Supple, no lymphadenopathy;  thyroid:  no   enlargement/tenderness/nodules; no carotid   bruit or JVD  Back:    Spine nontender, no curvature, ROM normal, no CVA      tenderness  Lungs:     Clear to auscultation bilaterally without wheezes, rales or     ronchi; respirations unlabored  Chest Wall:    No tenderness or deformity   Heart:    Regular rate and rhythm, S1 and S2 normal, no murmur, rub   or gallop  Breast Exam:    No chest wall tenderness, masses or gynecomastia  Abdomen:     Soft, non-tender, nondistended, normoactive bowel sounds,    no masses, no hepatosplenomegaly        Extremities:   No clubbing, cyanosis or edema  Pulses:   2+ and symmetric all extremities  Skin:   Skin color, texture, turgor normal, no rashes or lesions  Lymph nodes:   Cervical, supraclavicular, and axillary nodes normal  Neurologic:   CNII-XII intact, normal strength, sensation and gait; reflexes 2+ and symmetric throughout          Psych:   Normal mood, affect, hygiene and grooming.          Assessment & Plan:  Routine general medical examination at a health care facility - Plan: CBC with Differential/Platelet, Comprehensive metabolic panel, Uric Acid  Erectile dysfunction, unspecified erectile dysfunction type - Plan: sildenafil (VIAGRA) 100 MG tablet  History of hyperparathyroidism  Hyperlipidemia with target LDL less than 100  Essential hypertension  Hypogonadism male - Plan: Testosterone, PSA  Obesity (BMI 30-39.9)  Sleep apnea  Need for hepatitis C screening test - Plan: Hepatitis C antibody  Acute bronchitis, unspecified organism - Plan: amoxicillin (AMOXIL) 875 MG tablet  Actinic keratosis Prescription was written for Viagra. I also gave a prescription for renewal of his CPAP. Discussed the fact that he needs a read out on this. I gave him an auto titrate prescription. He will continue on all his other medications. Duke Salvia him to become as physically active as possible with the constraints of the foot.

## 2015-04-20 LAB — TESTOSTERONE: TESTOSTERONE: 256 ng/dL (ref 250–827)

## 2015-04-20 LAB — PSA: PSA: 1.53 ng/mL (ref <=4.00)

## 2015-04-20 LAB — HEPATITIS C ANTIBODY: HCV Ab: NEGATIVE

## 2015-04-21 ENCOUNTER — Telehealth: Payer: Self-pay | Admitting: Family Medicine

## 2015-04-21 ENCOUNTER — Encounter: Payer: Self-pay | Admitting: Family Medicine

## 2015-04-21 MED ORDER — BENZONATATE 200 MG PO CAPS: 200.0000 mg | ORAL_CAPSULE | Freq: Three times a day (TID) | ORAL | Status: DC | PRN

## 2015-04-21 NOTE — Telephone Encounter (Signed)
Patient called complaining of cough, keeping him awake the last 2 nights.  He has had a cold/cough since Christmas, and was put on Amoxil by Dr. Redmond School this past week when there for his physical.  Cough wakes him up at night, is mostly dry, sometimes gets up a little bit of phlegm. No fever, no wheezing or shortness of breath.  He has been taking Robitussin CF (which contains acetaminophen, phenylephrine, dextromethorphan and guaifenesin).  He also has Mucinex DM at home.  Advised to stop the Robitussin CF (wll raise blood pressure), change to Coricidin HPB, and continue Mucinex DM. Sending in rx for tessalon to use prn. Advised to keep head of bed elevated. Contact the office on Monday if still coughing, meds ineffective.

## 2015-04-23 LAB — HEMOGLOBIN A1C
HEMOGLOBIN A1C: 6.1 % — AB (ref <5.7)
MEAN PLASMA GLUCOSE: 128 mg/dL — AB (ref <117)

## 2015-05-07 ENCOUNTER — Encounter: Payer: Self-pay | Admitting: Family Medicine

## 2015-05-07 NOTE — Telephone Encounter (Signed)
P.A. Isabelle Course, due to reference range change, pt is now considered in normal range with result of 256, reference is now [id number]) Per [facility] fax a previous abnormal testosterone level to t# [phone] Attn PCR ref# [id number] this was faxed  Left message for pt

## 2015-05-07 NOTE — Telephone Encounter (Signed)
Pt informed, and he wanted noted that he is in range now due to that was with the Androgel.  Also states tried the patches before and they didn't work

## 2015-05-08 NOTE — Telephone Encounter (Signed)
P.A. Approved Androgel til 03/16/2038, left message for pt, faxed pharmacy

## 2015-07-12 ENCOUNTER — Other Ambulatory Visit: Payer: Self-pay | Admitting: Family Medicine

## 2015-07-12 NOTE — Telephone Encounter (Signed)
Is this okay to call in? 

## 2015-07-13 NOTE — Telephone Encounter (Signed)
Renew but have him set up an appt

## 2015-08-10 ENCOUNTER — Encounter: Payer: Self-pay | Admitting: Family Medicine

## 2015-08-14 ENCOUNTER — Other Ambulatory Visit: Payer: Self-pay

## 2015-08-14 MED ORDER — TESTOSTERONE 20.25 MG/ACT (1.62%) TD GEL: TRANSDERMAL | Status: DC

## 2015-08-16 ENCOUNTER — Encounter: Payer: Self-pay | Admitting: Family Medicine

## 2015-08-16 ENCOUNTER — Ambulatory Visit (INDEPENDENT_AMBULATORY_CARE_PROVIDER_SITE_OTHER): Payer: Medicare Other | Admitting: Family Medicine

## 2015-08-16 VITALS — BP 158/98 | HR 67 | Wt 238.0 lb

## 2015-08-16 DIAGNOSIS — E291 Testicular hypofunction: Secondary | ICD-10-CM

## 2015-08-16 DIAGNOSIS — I10 Essential (primary) hypertension: Secondary | ICD-10-CM

## 2015-08-16 DIAGNOSIS — E669 Obesity, unspecified: Secondary | ICD-10-CM | POA: Diagnosis not present

## 2015-08-16 LAB — TESTOSTERONE: Testosterone: 723 ng/dL (ref 250–827)

## 2015-08-16 MED ORDER — AMLODIPINE BESYLATE 10 MG PO TABS
10.0000 mg | ORAL_TABLET | Freq: Every day | ORAL | Status: DC
Start: 2015-08-16 — End: 2016-06-24

## 2015-08-16 MED ORDER — VALSARTAN-HYDROCHLOROTHIAZIDE 160-12.5 MG PO TABS
1.0000 | ORAL_TABLET | Freq: Every day | ORAL | Status: DC
Start: 2015-08-16 — End: 2016-06-24

## 2015-08-16 NOTE — Progress Notes (Signed)
   Subjective:    Patient ID: [name] [name], male    DOB: 1950/10/08, 65 y.o.   MRN: [id number]  HPI  He is here for a blood pressure check and to have a testosterone level drawn. He has been on  Edarbychlor and has been checking his blood pressures at home. They're running in the 150 range.Marland Kitchen He states that since his blood pressure is really not changed much, he would like to go back to his previous medication dosing and this will help cut down on his costs. He also notes that the testosterone is working well and when he skips doses he can tell when differences terms of energy.   Review of Systems     Objective:   Physical Exam Alert and in no distress. His blood pressure machine in general is reading at 10 points higher systolic and diastolic.       Assessment & Plan:  Essential hypertension - Plan: valsartan-hydrochlorothiazide (DIOVAN HCT) 160-12.5 MG tablet, amLODipine (NORVASC) 10 MG tablet  Hypogonadism male - Plan: Testosterone  Obesity (BMI 30-39.9) I will place him back on Diovan and Norvasc which was his previous regimen in the form of TRIBENZOR she states worked fairly well on the past. He also was on Coreg in the past. I also discussed diet and exercise as well as medications to avoid to help control his blood pressure. Encouraged physical activity. He is to call me in 1 month with his blood pressure readings.

## 2015-08-17 ENCOUNTER — Other Ambulatory Visit: Payer: Self-pay

## 2015-08-17 MED ORDER — TESTOSTERONE 20.25 MG/ACT (1.62%) TD GEL: TRANSDERMAL | Status: DC

## 2015-08-28 ENCOUNTER — Telehealth: Payer: Self-pay | Admitting: Family Medicine

## 2015-08-29 NOTE — Telephone Encounter (Signed)
Sent P.A. & attached labs

## 2015-09-03 ENCOUNTER — Encounter: Payer: Self-pay | Admitting: Family Medicine

## 2015-09-04 NOTE — Telephone Encounter (Signed)
P.A. Approved til 03/16/16, called pharmacy & went thru for $94 co pay,  Discount card will no longer work because pt has Medicare and discount cards will not work with Medicare.  Left message for pt

## 2015-09-21 ENCOUNTER — Other Ambulatory Visit: Payer: Self-pay

## 2015-09-21 ENCOUNTER — Encounter: Payer: Self-pay | Admitting: Family Medicine

## 2015-09-21 MED ORDER — SIMVASTATIN 20 MG PO TABS: 20.0000 mg | ORAL_TABLET | Freq: Every day | ORAL | Status: DC

## 2015-11-12 ENCOUNTER — Other Ambulatory Visit (INDEPENDENT_AMBULATORY_CARE_PROVIDER_SITE_OTHER): Payer: Medicare Other

## 2015-11-12 ENCOUNTER — Telehealth: Payer: Self-pay

## 2015-11-12 DIAGNOSIS — Z23 Encounter for immunization: Secondary | ICD-10-CM | POA: Diagnosis not present

## 2015-11-12 NOTE — Telephone Encounter (Signed)
He can have high dose flu and pneum 13

## 2015-11-12 NOTE — Telephone Encounter (Signed)
Pt called in to get flu vaccine scheduled, but he questions if he is due for pneumonia shot. Please advise on PNA shot. Pt is scheduled for 11:30 today. Victorino December

## 2016-02-13 DIAGNOSIS — L578 Other skin changes due to chronic exposure to nonionizing radiation: Secondary | ICD-10-CM | POA: Diagnosis not present

## 2016-02-13 DIAGNOSIS — B079 Viral wart, unspecified: Secondary | ICD-10-CM | POA: Diagnosis not present

## 2016-02-13 DIAGNOSIS — L821 Other seborrheic keratosis: Secondary | ICD-10-CM | POA: Diagnosis not present

## 2016-02-13 DIAGNOSIS — L570 Actinic keratosis: Secondary | ICD-10-CM | POA: Diagnosis not present

## 2016-02-13 DIAGNOSIS — D235 Other benign neoplasm of skin of trunk: Secondary | ICD-10-CM | POA: Diagnosis not present

## 2016-02-13 DIAGNOSIS — D1801 Hemangioma of skin and subcutaneous tissue: Secondary | ICD-10-CM | POA: Diagnosis not present

## 2016-03-26 ENCOUNTER — Encounter: Payer: Self-pay | Admitting: Family Medicine

## 2016-03-26 MED ORDER — SIMVASTATIN 20 MG PO TABS: 20.0000 mg | ORAL_TABLET | Freq: Every day | ORAL | 0 refills | Status: DC

## 2016-06-11 ENCOUNTER — Ambulatory Visit: Payer: Medicare Other | Admitting: Family Medicine

## 2016-06-24 ENCOUNTER — Ambulatory Visit (INDEPENDENT_AMBULATORY_CARE_PROVIDER_SITE_OTHER): Payer: Medicare Other | Admitting: Family Medicine

## 2016-06-24 ENCOUNTER — Other Ambulatory Visit: Payer: Self-pay | Admitting: Family Medicine

## 2016-06-24 ENCOUNTER — Telehealth: Payer: Self-pay

## 2016-06-24 ENCOUNTER — Encounter: Payer: Self-pay | Admitting: Family Medicine

## 2016-06-24 VITALS — BP 134/84 | HR 71 | Ht 71.0 in | Wt 240.0 lb

## 2016-06-24 DIAGNOSIS — Z87891 Personal history of nicotine dependence: Secondary | ICD-10-CM

## 2016-06-24 DIAGNOSIS — E669 Obesity, unspecified: Secondary | ICD-10-CM | POA: Diagnosis not present

## 2016-06-24 DIAGNOSIS — E291 Testicular hypofunction: Secondary | ICD-10-CM | POA: Diagnosis not present

## 2016-06-24 DIAGNOSIS — E785 Hyperlipidemia, unspecified: Secondary | ICD-10-CM | POA: Diagnosis not present

## 2016-06-24 DIAGNOSIS — G4730 Sleep apnea, unspecified: Secondary | ICD-10-CM

## 2016-06-24 DIAGNOSIS — I10 Essential (primary) hypertension: Secondary | ICD-10-CM | POA: Diagnosis not present

## 2016-06-24 DIAGNOSIS — N529 Male erectile dysfunction, unspecified: Secondary | ICD-10-CM

## 2016-06-24 DIAGNOSIS — Z136 Encounter for screening for cardiovascular disorders: Secondary | ICD-10-CM

## 2016-06-24 DIAGNOSIS — Z8639 Personal history of other endocrine, nutritional and metabolic disease: Secondary | ICD-10-CM | POA: Diagnosis not present

## 2016-06-24 DIAGNOSIS — Z125 Encounter for screening for malignant neoplasm of prostate: Secondary | ICD-10-CM

## 2016-06-24 LAB — LIPID PANEL
Cholesterol: 191 mg/dL (ref <200)
HDL: 47 mg/dL (ref >40)
LDL CALC: 118 mg/dL — AB (ref <100)
Total CHOL/HDL Ratio: 4.1 Ratio (ref <5.0)
Triglycerides: 129 mg/dL (ref <150)
VLDL: 26 mg/dL (ref <30)

## 2016-06-24 LAB — CBC WITH DIFFERENTIAL/PLATELET
BASOS PCT: 1 %
Basophils Absolute: 67 cells/uL (ref 0–200)
EOS PCT: 3 %
Eosinophils Absolute: 201 cells/uL (ref 15–500)
HCT: 45.3 % (ref 38.5–50.0)
HEMOGLOBIN: 15.8 g/dL (ref 13.2–17.1)
LYMPHS ABS: 1407 {cells}/uL (ref 850–3900)
LYMPHS PCT: 21 %
MCH: 30.1 pg (ref 27.0–33.0)
MCHC: 34.9 g/dL (ref 32.0–36.0)
MCV: 86.3 fL (ref 80.0–100.0)
MPV: 12.5 fL (ref 7.5–12.5)
Monocytes Absolute: 603 cells/uL (ref 200–950)
Monocytes Relative: 9 %
NEUTROS PCT: 66 %
Neutro Abs: 4422 cells/uL (ref 1500–7800)
Platelets: 151 10*3/uL (ref 140–400)
RBC: 5.25 MIL/uL (ref 4.20–5.80)
RDW: 13.3 % (ref 11.0–15.0)
WBC: 6.7 10*3/uL (ref 4.0–10.5)

## 2016-06-24 LAB — COMPREHENSIVE METABOLIC PANEL
ALT: 21 U/L (ref 9–46)
AST: 24 U/L (ref 10–35)
Albumin: 4.6 g/dL (ref 3.6–5.1)
Alkaline Phosphatase: 53 U/L (ref 40–115)
BILIRUBIN TOTAL: 2.3 mg/dL — AB (ref 0.2–1.2)
BUN: 16 mg/dL (ref 7–25)
CO2: 31 mmol/L (ref 20–31)
CREATININE: 1.19 mg/dL (ref 0.70–1.25)
Calcium: 9.4 mg/dL (ref 8.6–10.3)
Chloride: 101 mmol/L (ref 98–110)
GLUCOSE: 127 mg/dL — AB (ref 65–99)
Potassium: 3.5 mmol/L (ref 3.5–5.3)
SODIUM: 141 mmol/L (ref 135–146)
Total Protein: 6.9 g/dL (ref 6.1–8.1)

## 2016-06-24 MED ORDER — SILDENAFIL CITRATE 20 MG PO TABS: ORAL_TABLET | ORAL | 3 refills | Status: DC

## 2016-06-24 MED ORDER — TESTOSTERONE 20.25 MG/ACT (1.62%) TD GEL: TRANSDERMAL | 1 refills | Status: DC

## 2016-06-24 MED ORDER — VALSARTAN-HYDROCHLOROTHIAZIDE 160-12.5 MG PO TABS: 1.0000 | ORAL_TABLET | Freq: Every day | ORAL | 3 refills | Status: DC

## 2016-06-24 MED ORDER — AMLODIPINE BESYLATE 10 MG PO TABS: 10.0000 mg | ORAL_TABLET | Freq: Every day | ORAL | 3 refills | Status: DC

## 2016-06-24 MED ORDER — SIMVASTATIN 20 MG PO TABS: 20.0000 mg | ORAL_TABLET | Freq: Every day | ORAL | 0 refills | Status: DC

## 2016-06-24 NOTE — Progress Notes (Signed)
Subjective:   HPI  [name] [name] is a 66 y.o. male who presents for Chief Complaint  Patient presents with  . Annual Exam    welcome to Medicare / CPE     Medical care team includes: Wyatt Haste, MD here for primary care  [facility].   Preventative care:JCL  Last ophthalmology visit: 09/2015 Last dental visit: 05/2016 Last colonoscopy: 03/21/15 Last prostate exam: ? Last [id number] changes. Last labs:04/2015  Prior vaccinations:  TD or Tdap: 04/28/07 Influenza:11/12/15 Pneumococcal:23: 01/17/11 13:11/12/15 Shingles/Zostavax: 01/17/11  Other: Recommend that he get Shingrix he will also need tetanus next year.  Advanced directive: yes asked to bring in a copy   Concerns: He does have hyperlipidemia and continues on simvastatin and having no difficulty with them. He continues on testosterone and his last numbers look quite good. He is taking amlodipine and valsartan/HCTZ without difficulty. He also is taking Viagra but will like to get a less expensive version of that. He does have sleep apnea and continues on the CPAP. He is now retired however his weight has not changed even know he says he's increased his physical activities. He has a previous history of hyperparathyroidism with her hyper parathyroidectomy. We been followed with calcium levels.  Reviewed their medical, surgical, family, social, medication, and allergy history and updated chart as appropriate.  Past Medical History:  Diagnosis Date  . Dyslipidemia   . ED (erectile dysfunction)   . Hyperlipidemia   . Hyperparathyroidism   . Hypertension   . Hypogonadism, male   . Sleep apnea     Past Surgical History:  Procedure Laterality Date  . PARATHYROIDECTOMY      Social History   Social History  . Marital status: Married    Spouse name: N/A  . Number of children: N/A  . Years of education: N/A   Occupational History  . Not on file.   Social History Main Topics  . Smoking status: Never  Smoker  . Smokeless tobacco: Not on file  . Alcohol use 8.4 oz/week    14 Cans of beer per week  . Drug use: No  . Sexual activity: Yes   Other Topics Concern  . Not on file   Social History Narrative  . No narrative on file    Family History  Problem Relation Age of Onset  . Hypertension Father   . Hyperlipidemia Father      Current Outpatient Prescriptions:  .  amLODipine (NORVASC) 10 MG tablet, Take 1 tablet (10 mg total) by mouth daily., Disp: 90 tablet, Rfl: 3 .  aspirin 325 MG tablet, Take 325 mg by mouth daily.  , Disp: , Rfl:  .  EPINEPHrine (EPI-PEN) 0.3 mg/0.3 mL DEVI, Inject 0.3 mLs (0.3 mg total) into the muscle once., Disp: 1 Device, Rfl: 0 .  fish oil-omega-3 fatty acids 1000 MG capsule, Take 2 g by mouth daily.  , Disp: , Rfl:  .  Glucosamine-Chondroit-Vit C-Mn (GLUCOSAMINE 1500 COMPLEX) CAPS, Take by mouth., Disp: , Rfl:  .  Multiple Vitamins-Minerals (MULTIVITAMIN WITH MINERALS) tablet, Take 1 tablet by mouth daily.  , Disp: , Rfl:  .  sildenafil (VIAGRA) 100 MG tablet, Take 1 tablet (100 mg total) by mouth as needed., Disp: 10 tablet, Rfl: 11 .  simvastatin (ZOCOR) 20 MG tablet, Take 1 tablet (20 mg total) by mouth at bedtime., Disp: 90 tablet, Rfl: 0 .  Testosterone (ANDROGEL PUMP) 20.25 MG/ACT (1.62%) GEL, APPLY 2 PUMPS DAILY TO SHOULDER/UPPER ARM AS DIRECTED, Disp:  225 g, Rfl: 1 .  valsartan-hydrochlorothiazide (DIOVAN HCT) 160-12.5 MG tablet, Take 1 tablet by mouth daily., Disp: 90 tablet, Rfl: 3  Allergies  Allergen Reactions  . Yellow Jacket Venom        Review of Systems Detail yes except as above    Objective:  There were no vitals filed for this visit.  General appearance: alert, no distress, WD/WN, Caucasian male Skin:Normal  HEENT: normocephalic, conjunctiva/corneas normal, sclerae anicteric, PERRLA, EOMi, nares patent, no discharge or erythema, pharynx normal Oral cavity: MMM, tongue normal, teeth normal Neck: supple, no  lymphadenopathy, no thyromegaly, no masses, normal ROM, no bruits Chest: non tender, normal shape and expansion Heart: RRR, normal S1, S2, no murmurs Lungs: CTA bilaterally, no wheezes, rhonchi, or rales Abdomen: +bs, soft, non tender, non distended, no masses, no hepatomegaly, no splenomegaly, no bruits Back: non tender, normal ROM, no scoliosis Musculoskeletal: upper extremities non tender, no obvious deformity, normal ROM throughout, lower extremities non tender, no obvious deformity, normal ROM throughout Extremities: no edema, no cyanosis, no clubbing Pulses: 2+ symmetric, upper and lower extremities, normal cap refill Neurological: alert, oriented x 3, CN2-12 intact, strength normal upper extremities and lower extremities, sensation normal throughout, DTRs 2+ throughout, no cerebellar signs, gait normal Psychiatric: normal affect, behavior normal, pleasant    Assessment and Plan :    Hypogonadism male - Plan: Testosterone (ANDROGEL PUMP) 20.25 MG/ACT (1.62%) GEL, CBC with Differential/Platelet, Comprehensive metabolic panel, Testosterone  Essential hypertension - Plan: EKG 12-Lead, amLODipine (NORVASC) 10 MG tablet, valsartan-hydrochlorothiazide (DIOVAN HCT) 160-12.5 MG tablet, CBC with Differential/Platelet, Comprehensive metabolic panel  Obesity (BMI 30-39.9) - Plan: EKG 12-Lead  Erectile dysfunction, unspecified erectile dysfunction type - Plan: sildenafil (REVATIO) 20 MG tablet  History of hyperparathyroidism  Hyperlipidemia with target LDL less than 100 - Plan: simvastatin (ZOCOR) 20 MG tablet, Comprehensive metabolic panel, Lipid panel  Sleep apnea, unspecified type  Screening for prostate cancer  Screening for AAA (abdominal aortic aneurysm) - Plan: US Aorta  Special screening for malignant neoplasm of prostate - Plan: PR PSA, TOTAL SCREENING  Former smoker - Plan: US Aorta He did smoke roughly 100 cigarettes in his father apparently died from ruptured abdominal  aortic aneurysm. The above diagnoses are in general under good control and his present medication regimen. Discussed possible side effects from sildenafil with him. Encouraged him to keep himself physically active.  Physical exam - discussed and counseled on healthy lifestyle, diet, exercise, preventative care, vaccinations, sick and well care, proper use of emergency dept and after hours care, and addressed their concerns.    See your eye doctor yearly for routine vision care. See your dentist yearly for routine dental care including hygiene visits twice yearly.

## 2016-06-24 NOTE — Telephone Encounter (Signed)
Pt needs a refill on his sildenafil 20mg   # [place].

## 2016-06-24 NOTE — Telephone Encounter (Signed)
Dr.Lalonde is this okay to refill 

## 2016-06-25 LAB — TESTOSTERONE: TESTOSTERONE: 447 ng/dL (ref 250–827)

## 2016-06-26 LAB — HEMOGLOBIN A1C
Hgb A1c MFr Bld: 5.8 % — ABNORMAL HIGH (ref <5.7)
Mean Plasma Glucose: 120 mg/dL

## 2016-06-28 ENCOUNTER — Telehealth: Payer: Self-pay | Admitting: Family Medicine

## 2016-06-28 NOTE — Telephone Encounter (Signed)
P.A. SILDENAFIL  

## 2016-07-04 ENCOUNTER — Other Ambulatory Visit: Payer: Self-pay

## 2016-07-04 ENCOUNTER — Encounter: Payer: Self-pay | Admitting: Family Medicine

## 2016-07-04 DIAGNOSIS — N529 Male erectile dysfunction, unspecified: Secondary | ICD-10-CM

## 2016-07-04 DIAGNOSIS — Z125 Encounter for screening for malignant neoplasm of prostate: Secondary | ICD-10-CM

## 2016-07-04 DIAGNOSIS — Z87891 Personal history of nicotine dependence: Secondary | ICD-10-CM

## 2016-07-04 DIAGNOSIS — Z136 Encounter for screening for cardiovascular disorders: Secondary | ICD-10-CM

## 2016-07-04 MED ORDER — SILDENAFIL CITRATE 20 MG PO TABS: ORAL_TABLET | ORAL | 3 refills | Status: DC

## 2016-07-11 ENCOUNTER — Ambulatory Visit
Admission: RE | Admit: 2016-07-11 | Discharge: 2016-07-11 | Disposition: A | Discharge status: Home / Self Care | Payer: Medicare Other | Source: Ambulatory Visit | Attending: Family Medicine | Admitting: Family Medicine

## 2016-07-11 ENCOUNTER — Other Ambulatory Visit: Payer: Self-pay

## 2016-07-11 DIAGNOSIS — Z87891 Personal history of nicotine dependence: Secondary | ICD-10-CM

## 2016-07-11 DIAGNOSIS — Z136 Encounter for screening for cardiovascular disorders: Secondary | ICD-10-CM | POA: Diagnosis not present

## 2016-07-18 NOTE — Telephone Encounter (Signed)
P.A. Denied, sexual dysfunction meds are excluded from Medicare Part D, left mess for pt

## 2016-08-05 ENCOUNTER — Other Ambulatory Visit: Payer: Self-pay | Admitting: Family Medicine

## 2016-08-05 DIAGNOSIS — I10 Essential (primary) hypertension: Secondary | ICD-10-CM

## 2016-09-26 ENCOUNTER — Other Ambulatory Visit: Payer: Self-pay | Admitting: Family Medicine

## 2016-09-26 DIAGNOSIS — E785 Hyperlipidemia, unspecified: Secondary | ICD-10-CM

## 2016-09-29 ENCOUNTER — Encounter: Payer: Self-pay | Admitting: Family Medicine

## 2016-12-30 ENCOUNTER — Other Ambulatory Visit (INDEPENDENT_AMBULATORY_CARE_PROVIDER_SITE_OTHER): Payer: Medicare Other

## 2016-12-30 DIAGNOSIS — Z23 Encounter for immunization: Secondary | ICD-10-CM | POA: Diagnosis not present

## 2017-01-16 ENCOUNTER — Other Ambulatory Visit: Payer: Self-pay | Admitting: Family Medicine

## 2017-01-16 DIAGNOSIS — E291 Testicular hypofunction: Secondary | ICD-10-CM

## 2017-01-16 NOTE — Telephone Encounter (Signed)
Pt is not home at the moment and pt's wife will tell him to call back so he can schedule an appt

## 2017-01-16 NOTE — Telephone Encounter (Signed)
Okay to renew but needs an appointment

## 2017-01-16 NOTE — Telephone Encounter (Signed)
Is this okay to refill? 

## 2017-02-18 DIAGNOSIS — D225 Melanocytic nevi of trunk: Secondary | ICD-10-CM | POA: Diagnosis not present

## 2017-02-18 DIAGNOSIS — L570 Actinic keratosis: Secondary | ICD-10-CM | POA: Diagnosis not present

## 2017-02-18 DIAGNOSIS — L820 Inflamed seborrheic keratosis: Secondary | ICD-10-CM | POA: Diagnosis not present

## 2017-02-18 DIAGNOSIS — L814 Other melanin hyperpigmentation: Secondary | ICD-10-CM | POA: Diagnosis not present

## 2017-02-18 DIAGNOSIS — L821 Other seborrheic keratosis: Secondary | ICD-10-CM | POA: Diagnosis not present

## 2017-03-04 DIAGNOSIS — S66812A Strain of other specified muscles, fascia and tendons at wrist and hand level, left hand, initial encounter: Secondary | ICD-10-CM | POA: Diagnosis not present

## 2017-03-04 DIAGNOSIS — M25532 Pain in left wrist: Secondary | ICD-10-CM | POA: Diagnosis not present

## 2017-03-23 ENCOUNTER — Other Ambulatory Visit: Payer: Self-pay | Admitting: Family Medicine

## 2017-03-23 DIAGNOSIS — M25532 Pain in left wrist: Secondary | ICD-10-CM | POA: Diagnosis not present

## 2017-03-23 DIAGNOSIS — E785 Hyperlipidemia, unspecified: Secondary | ICD-10-CM

## 2017-03-25 ENCOUNTER — Other Ambulatory Visit: Payer: Self-pay | Admitting: Family Medicine

## 2017-03-25 DIAGNOSIS — E291 Testicular hypofunction: Secondary | ICD-10-CM

## 2017-03-25 NOTE — Telephone Encounter (Signed)
Is this okay to refill? 

## 2017-03-25 NOTE — Telephone Encounter (Signed)
He needs an appointment.  Do not let him run out

## 2017-03-27 ENCOUNTER — Encounter: Payer: Self-pay | Admitting: Family Medicine

## 2017-03-28 DIAGNOSIS — M25532 Pain in left wrist: Secondary | ICD-10-CM | POA: Diagnosis not present

## 2017-04-03 DIAGNOSIS — M25532 Pain in left wrist: Secondary | ICD-10-CM | POA: Diagnosis not present

## 2017-04-05 ENCOUNTER — Telehealth: Payer: Self-pay | Admitting: Family Medicine

## 2017-04-05 NOTE — Telephone Encounter (Signed)
P.A. ANDROGEL 1.62% °

## 2017-04-11 NOTE — Telephone Encounter (Signed)
P.A. Approved til 03/06/19 pt informed, he picked up already

## 2017-04-24 ENCOUNTER — Encounter: Payer: Self-pay | Admitting: Family Medicine

## 2017-04-24 ENCOUNTER — Ambulatory Visit (INDEPENDENT_AMBULATORY_CARE_PROVIDER_SITE_OTHER): Payer: Medicare Other | Admitting: Family Medicine

## 2017-04-24 VITALS — BP 136/88 | HR 75 | Wt 243.6 lb

## 2017-04-24 DIAGNOSIS — Z125 Encounter for screening for malignant neoplasm of prostate: Secondary | ICD-10-CM | POA: Diagnosis not present

## 2017-04-24 DIAGNOSIS — E669 Obesity, unspecified: Secondary | ICD-10-CM | POA: Diagnosis not present

## 2017-04-24 DIAGNOSIS — N529 Male erectile dysfunction, unspecified: Secondary | ICD-10-CM | POA: Diagnosis not present

## 2017-04-24 DIAGNOSIS — E785 Hyperlipidemia, unspecified: Secondary | ICD-10-CM | POA: Diagnosis not present

## 2017-04-24 DIAGNOSIS — Z8639 Personal history of other endocrine, nutritional and metabolic disease: Secondary | ICD-10-CM | POA: Diagnosis not present

## 2017-04-24 DIAGNOSIS — I10 Essential (primary) hypertension: Secondary | ICD-10-CM

## 2017-04-24 DIAGNOSIS — B351 Tinea unguium: Secondary | ICD-10-CM

## 2017-04-24 DIAGNOSIS — G4730 Sleep apnea, unspecified: Secondary | ICD-10-CM | POA: Diagnosis not present

## 2017-04-24 DIAGNOSIS — E291 Testicular hypofunction: Secondary | ICD-10-CM | POA: Diagnosis not present

## 2017-04-24 MED ORDER — TERBINAFINE HCL 250 MG PO TABS: 250.0000 mg | ORAL_TABLET | Freq: Every day | ORAL | 0 refills | Status: DC

## 2017-04-24 MED ORDER — SIMVASTATIN 20 MG PO TABS: ORAL_TABLET | ORAL | 3 refills | Status: DC

## 2017-04-24 MED ORDER — TESTOSTERONE 20.25 MG/ACT (1.62%) TD GEL: TRANSDERMAL | 5 refills | Status: DC

## 2017-04-24 MED ORDER — VALSARTAN-HYDROCHLOROTHIAZIDE 160-12.5 MG PO TABS: 1.0000 | ORAL_TABLET | Freq: Every day | ORAL | 3 refills | Status: DC

## 2017-04-24 MED ORDER — SILDENAFIL CITRATE 20 MG PO TABS: ORAL_TABLET | ORAL | 3 refills | Status: DC

## 2017-04-24 MED ORDER — AMLODIPINE BESYLATE 10 MG PO TABS: 10.0000 mg | ORAL_TABLET | Freq: Every day | ORAL | 3 refills | Status: DC

## 2017-04-24 NOTE — Progress Notes (Addendum)
   Subjective:    Patient ID: [name] [name], male    DOB: 08/11/1950, 67 y.o.   MRN: [id number]  HPI He is here for an interval evaluation.  He is now retired and keeping himself quite busy with various projects.  He continues on AndroGel and does find that it helps with energy and stamina.  Simvastatin is causing no difficulty.  He continues on amlodipine and valsartan/HCTZ.  Intermittently uses sildenafil for his ED.  He does have underlying OSA and uses CPAP with good results.  Does check his blood pressure at home and it is in the normal range.  He has had it checked against our machines in the office.  He does have a history of hyperparathyroidism.  He also has concerns over his toenails.  He has been using an antifungal which did help but he would like to be placed on a medication for this. He does use a treadmill several times per week.  Review of Systems     Objective:   Physical Exam Alert and in no distress. Tympanic membranes and canals are normal. Pharyngeal area is normal. Neck is supple without adenopathy or thyromegaly. Cardiac exam shows a regular sinus rhythm without murmurs or gallops. Lungs are clear to auscultation.Abdominal exam shows no masses or tenderness with normal bowel sounds. Exam of his feet does show thickening of several of the nails.       Assessment & Plan:  Essential hypertension - Plan: CBC with Differential/Platelet, Comprehensive metabolic panel, amLODipine (NORVASC) 10 MG tablet, valsartan-hydrochlorothiazide (DIOVAN HCT) 160-12.5 MG tablet  Hypogonadism male - Plan: PSA, Testosterone, Testosterone (ANDROGEL PUMP) 20.25 MG/ACT (1.62%) GEL  Obesity (BMI 30-39.9) - Plan: CBC with Differential/Platelet, Comprehensive metabolic panel  Erectile dysfunction, unspecified erectile dysfunction type - Plan: sildenafil (REVATIO) 20 MG tablet  History of hyperparathyroidism - Plan: Parathyroid hormone, intact (no Ca)  Hyperlipidemia with target LDL less than 100  - Plan: Lipid panel, simvastatin (ZOCOR) 20 MG tablet  Sleep apnea, unspecified type  Onychomycosis - Plan: terbinafine (LAMISIL) 250 MG tablet  Encounter for prostate cancer screening - Plan: PSA I explained that curing the nails is not necessarily the goal but to get him under control periodically after that needing to use the medication again.  We will recheck this in 3 months. Discussed diet and exercise with him specifically cutting back on carbohydrates as he does keep himself physically active. His testosterone level was quite high however discussion with him indicates he used the topical preparation a few hours prior to getting the blood drawn.  When he returns in several months for recheck on the Lamisil, I will recheck his testosterone.

## 2017-04-25 LAB — CBC WITH DIFFERENTIAL/PLATELET
BASOS: 1 %
Basophils Absolute: 0 10*3/uL (ref 0.0–0.2)
EOS (ABSOLUTE): 0.3 10*3/uL (ref 0.0–0.4)
EOS: 4 %
HEMATOCRIT: 43.6 % (ref 37.5–51.0)
Hemoglobin: 15.6 g/dL (ref 13.0–17.7)
IMMATURE GRANULOCYTES: 0 %
Immature Grans (Abs): 0 10*3/uL (ref 0.0–0.1)
Lymphocytes Absolute: 1.5 10*3/uL (ref 0.7–3.1)
Lymphs: 21 %
MCH: 29.9 pg (ref 26.6–33.0)
MCHC: 35.8 g/dL — ABNORMAL HIGH (ref 31.5–35.7)
MCV: 84 fL (ref 79–97)
MONOS ABS: 0.5 10*3/uL (ref 0.1–0.9)
Monocytes: 7 %
NEUTROS PCT: 67 %
Neutrophils Absolute: 4.7 10*3/uL (ref 1.4–7.0)
Platelets: 155 10*3/uL (ref 150–379)
RBC: 5.22 x10E6/uL (ref 4.14–5.80)
RDW: 13.6 % (ref 12.3–15.4)
WBC: 7 10*3/uL (ref 3.4–10.8)

## 2017-04-25 LAB — COMPREHENSIVE METABOLIC PANEL
A/G RATIO: 1.8 (ref 1.2–2.2)
ALT: 32 IU/L (ref 0–44)
AST: 29 IU/L (ref 0–40)
Albumin: 4.6 g/dL (ref 3.6–4.8)
Alkaline Phosphatase: 57 IU/L (ref 39–117)
BUN / CREAT RATIO: 12 (ref 10–24)
BUN: 13 mg/dL (ref 8–27)
Bilirubin Total: 2.3 mg/dL — ABNORMAL HIGH (ref 0.0–1.2)
CALCIUM: 9.7 mg/dL (ref 8.6–10.2)
CO2: 25 mmol/L (ref 20–29)
Chloride: 101 mmol/L (ref 96–106)
Creatinine, Ser: 1.08 mg/dL (ref 0.76–1.27)
GFR, EST AFRICAN AMERICAN: 82 mL/min/{1.73_m2} (ref >59)
GFR, EST NON AFRICAN AMERICAN: 71 mL/min/{1.73_m2} (ref >59)
GLOBULIN, TOTAL: 2.6 g/dL (ref 1.5–4.5)
Glucose: 122 mg/dL — ABNORMAL HIGH (ref 65–99)
POTASSIUM: 2.9 mmol/L — AB (ref 3.5–5.2)
SODIUM: 144 mmol/L (ref 134–144)
TOTAL PROTEIN: 7.2 g/dL (ref 6.0–8.5)

## 2017-04-25 LAB — LIPID PANEL
CHOL/HDL RATIO: 4.9 ratio (ref 0.0–5.0)
Cholesterol, Total: 195 mg/dL (ref 100–199)
HDL: 40 mg/dL (ref >39)
LDL Calculated: 127 mg/dL — ABNORMAL HIGH (ref 0–99)
Triglycerides: 142 mg/dL (ref 0–149)
VLDL Cholesterol Cal: 28 mg/dL (ref 5–40)

## 2017-04-25 LAB — PSA: PROSTATE SPECIFIC AG, SERUM: 2.2 ng/mL (ref 0.0–4.0)

## 2017-04-25 LAB — TESTOSTERONE: TESTOSTERONE: 1336 ng/dL — AB (ref 264–916)

## 2017-04-27 ENCOUNTER — Encounter: Payer: Self-pay | Admitting: Family Medicine

## 2017-04-27 ENCOUNTER — Other Ambulatory Visit: Payer: Self-pay | Admitting: Family Medicine

## 2017-04-27 DIAGNOSIS — E291 Testicular hypofunction: Secondary | ICD-10-CM

## 2017-04-28 ENCOUNTER — Telehealth: Payer: Self-pay | Admitting: Family Medicine

## 2017-04-28 NOTE — Telephone Encounter (Signed)
Yes I took care of this while on the phone with pharmacist

## 2017-04-28 NOTE — Telephone Encounter (Signed)
I think he took care of this

## 2017-04-28 NOTE — Telephone Encounter (Signed)
Pt left message that Valsartan was only for #30 not 90.  I called pharmacy and Abby pharmacist doesn't know why was only filled for #30, he can return to pharmacy and they will reprocess.  May have been insurance would only pay for #30.  I called in Androgel and left message for pt.

## 2017-04-28 NOTE — Telephone Encounter (Signed)
Pt pa has been approved for andro gel . Is this ok to fill  thanks [facility]

## 2017-06-18 ENCOUNTER — Other Ambulatory Visit: Payer: Self-pay | Admitting: Family Medicine

## 2017-06-18 DIAGNOSIS — E785 Hyperlipidemia, unspecified: Secondary | ICD-10-CM

## 2017-07-19 ENCOUNTER — Encounter: Payer: Self-pay | Admitting: Family Medicine

## 2017-07-23 ENCOUNTER — Ambulatory Visit: Payer: Medicare Other | Admitting: Family Medicine

## 2017-08-17 ENCOUNTER — Encounter: Payer: Self-pay | Admitting: Family Medicine

## 2017-08-18 ENCOUNTER — Ambulatory Visit: Payer: Medicare Other | Admitting: Family Medicine

## 2017-09-03 ENCOUNTER — Ambulatory Visit (INDEPENDENT_AMBULATORY_CARE_PROVIDER_SITE_OTHER): Payer: Medicare Other | Admitting: Family Medicine

## 2017-09-03 VITALS — BP 140/86 | HR 76 | Temp 97.8°F | Ht 71.0 in | Wt 244.6 lb

## 2017-09-03 DIAGNOSIS — Z79899 Other long term (current) drug therapy: Secondary | ICD-10-CM

## 2017-09-03 DIAGNOSIS — B351 Tinea unguium: Secondary | ICD-10-CM | POA: Diagnosis not present

## 2017-09-03 DIAGNOSIS — I10 Essential (primary) hypertension: Secondary | ICD-10-CM | POA: Diagnosis not present

## 2017-09-03 DIAGNOSIS — E291 Testicular hypofunction: Secondary | ICD-10-CM | POA: Diagnosis not present

## 2017-09-03 DIAGNOSIS — E669 Obesity, unspecified: Secondary | ICD-10-CM

## 2017-09-03 MED ORDER — VALSARTAN-HYDROCHLOROTHIAZIDE 320-12.5 MG PO TABS: 1.0000 | ORAL_TABLET | Freq: Every day | ORAL | 3 refills | Status: DC

## 2017-09-03 MED ORDER — TERBINAFINE HCL 250 MG PO TABS
250.0000 mg | ORAL_TABLET | Freq: Every day | ORAL | 0 refills | Status: DC
Start: 2017-09-03 — End: 2017-09-04

## 2017-09-03 NOTE — Progress Notes (Signed)
   Subjective:    Patient ID: [name] [name], male    DOB: [birth year], 67 y.o.   MRN: [id number]  HPI He is here for a recheck.  He does note an improvement in his toenails being on the Lamisil.  He has been on that for roughly 3 months.  He also will need a testosterone evaluation as the last time he was here, he did use a topical right before getting the blood drawn.   Review of Systems     Objective:   Physical Exam Alert and in no distress.  Blood pressure is recorded.  Exam of the toenails does show some proximal clearing of the nail.       Assessment & Plan:  Hypogonadism male - Plan: Testosterone  Essential hypertension - Plan: valsartan-hydrochlorothiazide (DIOVAN-HCT) 320-12.5 MG tablet  Obesity (BMI 30-39.9)  Onychomycosis - Plan: terbinafine (LAMISIL) 250 MG tablet  Encounter for long-term (current) use of high-risk medication - Plan: Testosterone I will increase his valsartan and recheck here in a month.  Renew his Lamisil and do blood work.  Also encouraged him to make further diet and exercise changes.

## 2017-09-04 ENCOUNTER — Other Ambulatory Visit: Payer: Self-pay

## 2017-09-04 DIAGNOSIS — B351 Tinea unguium: Secondary | ICD-10-CM

## 2017-09-04 DIAGNOSIS — I10 Essential (primary) hypertension: Secondary | ICD-10-CM

## 2017-09-04 LAB — TESTOSTERONE: Testosterone: 1056 ng/dL — ABNORMAL HIGH (ref 264–916)

## 2017-09-04 MED ORDER — VALSARTAN-HYDROCHLOROTHIAZIDE 320-12.5 MG PO TABS: 1.0000 | ORAL_TABLET | Freq: Every day | ORAL | 3 refills | Status: DC

## 2017-09-04 MED ORDER — TERBINAFINE HCL 250 MG PO TABS: 250.0000 mg | ORAL_TABLET | Freq: Every day | ORAL | 0 refills | Status: DC

## 2017-09-04 NOTE — Telephone Encounter (Signed)
Called pt to let him that his med was sent in to the correct pharmacy. [name] [name]

## 2017-09-29 ENCOUNTER — Other Ambulatory Visit: Payer: Self-pay | Admitting: Family Medicine

## 2017-09-29 DIAGNOSIS — E785 Hyperlipidemia, unspecified: Secondary | ICD-10-CM

## 2017-10-14 ENCOUNTER — Other Ambulatory Visit: Payer: Self-pay

## 2017-10-20 ENCOUNTER — Encounter: Payer: Self-pay | Admitting: Family Medicine

## 2017-11-02 ENCOUNTER — Encounter: Payer: Self-pay | Admitting: Family Medicine

## 2017-12-29 ENCOUNTER — Ambulatory Visit (INDEPENDENT_AMBULATORY_CARE_PROVIDER_SITE_OTHER): Payer: Medicare Other | Admitting: Family Medicine

## 2017-12-29 ENCOUNTER — Encounter: Payer: Self-pay | Admitting: Family Medicine

## 2017-12-29 VITALS — BP 138/82 | HR 63 | Temp 97.7°F | Wt 241.2 lb

## 2017-12-29 DIAGNOSIS — I10 Essential (primary) hypertension: Secondary | ICD-10-CM

## 2017-12-29 DIAGNOSIS — E291 Testicular hypofunction: Secondary | ICD-10-CM | POA: Diagnosis not present

## 2017-12-29 DIAGNOSIS — E669 Obesity, unspecified: Secondary | ICD-10-CM

## 2017-12-29 DIAGNOSIS — B351 Tinea unguium: Secondary | ICD-10-CM | POA: Diagnosis not present

## 2017-12-29 DIAGNOSIS — Z23 Encounter for immunization: Secondary | ICD-10-CM

## 2017-12-29 MED ORDER — TERBINAFINE HCL 250 MG PO TABS: 250.0000 mg | ORAL_TABLET | Freq: Every day | ORAL | 0 refills | Status: DC

## 2017-12-29 NOTE — Progress Notes (Signed)
   Subjective:    Patient ID: [name] [name], male    DOB: 01/09/1951, 67 y.o.   MRN: [id number]  HPI He is here for a follow-up visit.  He continues on his blood pressure medication without a problem.  He is exercising 30 minutes 4 or 5 times per week.  He has made some changes to cut back on carbohydrates.  He is now using 1 pump of his testosterone cream daily.  He has been taking Lamisil for the last 6 months and does see an improvement in his toenails.   Review of Systems     Objective:   Physical Exam Alert and in no distress.  Blood pressure and weight are recorded.  Exam of his feet does show clearing of at least 50% of the nail bed.       Assessment & Plan:  Essential hypertension  Hypogonadism male - Plan: Testosterone  Obesity (BMI 30-39.9)  Onychomycosis - Plan: terbinafine (LAMISIL) 250 MG tablet  Need for influenza vaccination - Plan: Flu vaccine HIGH DOSE PF (Fluzone High dose) Continue on his present medications.  Discussed making further changes in his diet in particular since he does seem to be doing a good job with his exercise.  He would like another 3 months worth of Lamisil.  Flu shot given today.

## 2017-12-30 ENCOUNTER — Other Ambulatory Visit: Payer: Self-pay

## 2017-12-30 ENCOUNTER — Telehealth: Payer: Self-pay

## 2017-12-30 DIAGNOSIS — B351 Tinea unguium: Secondary | ICD-10-CM

## 2017-12-30 DIAGNOSIS — E291 Testicular hypofunction: Secondary | ICD-10-CM

## 2017-12-30 LAB — TESTOSTERONE: Testosterone: 326 ng/dL (ref 264–916)

## 2017-12-30 MED ORDER — TESTOSTERONE 20.25 MG/ACT (1.62%) TD GEL: TRANSDERMAL | 5 refills | Status: DC

## 2017-12-30 MED ORDER — TERBINAFINE HCL 250 MG PO TABS: 250.0000 mg | ORAL_TABLET | Freq: Every day | ORAL | 0 refills | Status: DC

## 2017-12-30 NOTE — Telephone Encounter (Signed)
Patient called and stated he need to have his Lamisil sent to [facility] because he's going out to town and can't wait for the mail delivery.

## 2018-01-12 ENCOUNTER — Other Ambulatory Visit: Payer: Self-pay | Admitting: Medical

## 2018-01-12 DIAGNOSIS — E785 Hyperlipidemia, unspecified: Secondary | ICD-10-CM

## 2018-01-14 ENCOUNTER — Telehealth: Payer: Self-pay

## 2018-01-14 NOTE — Telephone Encounter (Signed)
LVM for pt to advise of screening needed. LVM for pt to call back to office. [place]

## 2018-01-18 ENCOUNTER — Other Ambulatory Visit: Payer: Self-pay | Admitting: Family Medicine

## 2018-01-18 DIAGNOSIS — E291 Testicular hypofunction: Secondary | ICD-10-CM

## 2018-01-18 NOTE — Telephone Encounter (Signed)
Called in pt andro gel as your request 12-30-17. Pharmacist could not come to the phone and I spoke to pt and he has not got it yet. Please advise or send in . Thanks [employer]

## 2018-03-09 ENCOUNTER — Other Ambulatory Visit: Payer: Self-pay | Admitting: Family Medicine

## 2018-03-09 DIAGNOSIS — I10 Essential (primary) hypertension: Secondary | ICD-10-CM

## 2018-03-23 ENCOUNTER — Encounter: Payer: Self-pay | Admitting: Family Medicine

## 2018-03-23 DIAGNOSIS — E785 Hyperlipidemia, unspecified: Secondary | ICD-10-CM

## 2018-03-23 DIAGNOSIS — I10 Essential (primary) hypertension: Secondary | ICD-10-CM

## 2018-03-23 DIAGNOSIS — E291 Testicular hypofunction: Secondary | ICD-10-CM

## 2018-03-23 MED ORDER — VALSARTAN-HYDROCHLOROTHIAZIDE 320-12.5 MG PO TABS: 1.0000 | ORAL_TABLET | Freq: Every day | ORAL | 1 refills | Status: DC

## 2018-03-23 MED ORDER — TESTOSTERONE 20.25 MG/ACT (1.62%) TD GEL: TRANSDERMAL | 2 refills | Status: DC

## 2018-03-23 MED ORDER — AMLODIPINE BESYLATE 10 MG PO TABS: ORAL_TABLET | ORAL | 1 refills | Status: DC

## 2018-03-23 MED ORDER — SIMVASTATIN 20 MG PO TABS: ORAL_TABLET | ORAL | 1 refills | Status: DC

## 2018-03-23 NOTE — Telephone Encounter (Signed)
Please sent testosterone to new pharmacy.

## 2018-03-26 DIAGNOSIS — D229 Melanocytic nevi, unspecified: Secondary | ICD-10-CM | POA: Diagnosis not present

## 2018-03-26 DIAGNOSIS — L578 Other skin changes due to chronic exposure to nonionizing radiation: Secondary | ICD-10-CM | POA: Diagnosis not present

## 2018-03-26 DIAGNOSIS — L814 Other melanin hyperpigmentation: Secondary | ICD-10-CM | POA: Diagnosis not present

## 2018-03-26 DIAGNOSIS — L821 Other seborrheic keratosis: Secondary | ICD-10-CM | POA: Diagnosis not present

## 2018-03-26 DIAGNOSIS — L819 Disorder of pigmentation, unspecified: Secondary | ICD-10-CM | POA: Diagnosis not present

## 2018-03-26 DIAGNOSIS — L570 Actinic keratosis: Secondary | ICD-10-CM | POA: Diagnosis not present

## 2018-04-06 ENCOUNTER — Telehealth: Payer: Self-pay | Admitting: Family Medicine

## 2018-04-09 NOTE — Telephone Encounter (Signed)
Error

## 2018-04-21 DIAGNOSIS — L578 Other skin changes due to chronic exposure to nonionizing radiation: Secondary | ICD-10-CM | POA: Diagnosis not present

## 2018-04-21 DIAGNOSIS — L570 Actinic keratosis: Secondary | ICD-10-CM | POA: Diagnosis not present

## 2018-09-06 ENCOUNTER — Other Ambulatory Visit: Payer: Self-pay | Admitting: Family Medicine

## 2018-09-06 DIAGNOSIS — E785 Hyperlipidemia, unspecified: Secondary | ICD-10-CM

## 2018-09-06 DIAGNOSIS — I10 Essential (primary) hypertension: Secondary | ICD-10-CM

## 2018-09-15 ENCOUNTER — Other Ambulatory Visit: Payer: Self-pay

## 2018-09-15 DIAGNOSIS — Z20822 Contact with and (suspected) exposure to covid-19: Secondary | ICD-10-CM

## 2018-09-21 LAB — NOVEL CORONAVIRUS, NAA: SARS-CoV-2, NAA: NOT DETECTED

## 2018-09-21 LAB — SPECIMEN STATUS REPORT

## 2018-09-30 ENCOUNTER — Other Ambulatory Visit: Payer: Self-pay

## 2018-09-30 ENCOUNTER — Ambulatory Visit (INDEPENDENT_AMBULATORY_CARE_PROVIDER_SITE_OTHER): Payer: Medicare Other | Admitting: Family Medicine

## 2018-09-30 ENCOUNTER — Encounter: Payer: Self-pay | Admitting: Family Medicine

## 2018-09-30 VITALS — BP 122/86 | HR 68 | Temp 98.5°F | Ht 71.0 in | Wt 240.8 lb

## 2018-09-30 DIAGNOSIS — G4730 Sleep apnea, unspecified: Secondary | ICD-10-CM | POA: Diagnosis not present

## 2018-09-30 DIAGNOSIS — Z125 Encounter for screening for malignant neoplasm of prostate: Secondary | ICD-10-CM

## 2018-09-30 DIAGNOSIS — E669 Obesity, unspecified: Secondary | ICD-10-CM

## 2018-09-30 DIAGNOSIS — I10 Essential (primary) hypertension: Secondary | ICD-10-CM | POA: Diagnosis not present

## 2018-09-30 DIAGNOSIS — E291 Testicular hypofunction: Secondary | ICD-10-CM

## 2018-09-30 DIAGNOSIS — N529 Male erectile dysfunction, unspecified: Secondary | ICD-10-CM

## 2018-09-30 DIAGNOSIS — Z8639 Personal history of other endocrine, nutritional and metabolic disease: Secondary | ICD-10-CM

## 2018-09-30 DIAGNOSIS — E785 Hyperlipidemia, unspecified: Secondary | ICD-10-CM

## 2018-09-30 MED ORDER — VALSARTAN-HYDROCHLOROTHIAZIDE 320-12.5 MG PO TABS: 1.0000 | ORAL_TABLET | Freq: Every day | ORAL | 3 refills | Status: DC

## 2018-09-30 MED ORDER — SILDENAFIL CITRATE 20 MG PO TABS: ORAL_TABLET | ORAL | 3 refills | Status: DC

## 2018-09-30 MED ORDER — SIMVASTATIN 20 MG PO TABS: 20.0000 mg | ORAL_TABLET | Freq: Every day | ORAL | 3 refills | Status: DC

## 2018-09-30 MED ORDER — TESTOSTERONE 20.25 MG/ACT (1.62%) TD GEL: TRANSDERMAL | 2 refills | Status: DC

## 2018-09-30 MED ORDER — AMLODIPINE BESYLATE 10 MG PO TABS: ORAL_TABLET | ORAL | 3 refills | Status: DC

## 2018-09-30 NOTE — Addendum Note (Signed)
Addended by: Denita Lung on: 09/30/2018 02:34 PM   Modules accepted: Level of Service

## 2018-09-30 NOTE — Patient Instructions (Signed)
  Mr. Sisler , Thank you for taking time to come for your Medicare Wellness Visit. I appreciate your ongoing commitment to your health goals. Please review the following plan we discussed and let me know if I can assist you in the future.   These are the goals we discussed: Goals   None     This is a list of the screening recommended for you and due dates:  Health Maintenance  Topic Date Due  . Pneumonia vaccines (2 of 2 - PPSV23) 11/11/2016  . Flu Shot  10/16/2018  . Colon Cancer Screening  03/13/2025  . Tetanus Vaccine  10/06/2027  .  Hepatitis C: One time screening is recommended by Center for Disease Control  (CDC) for  adults born from [birth year] through 1965.   Completed

## 2018-09-30 NOTE — Progress Notes (Addendum)
[name] [name] is a 68 y.o. male who presents for annual wellness visit and follow-up on chronic medical conditions.  He has hypertension and continues on amlodipine as well as valsartan/HCTZ.  He continues on testosterone and is doing well on 2 pumps per day.  He does have OSA and is on CPAP.  Apparently his machine is over 34 years old.  He gets good results with this but is never had a readout.  He continues on sildenafil for his ED and is very happy with this.  There is a previous history of hyperparathyroidism.  See above   Immunizations and Health Maintenance Immunization History  Administered Date(s) Administered  . Hepatitis A 04/28/2007, 05/31/2007  . Hepatitis B 04/28/2007, 05/31/2007, 01/08/2010  . Influenza Split 11/16/2011, 12/14/2012  . Influenza Whole 12/19/2008  . Influenza, High Dose Seasonal PF 11/12/2015, 12/30/2016, 12/29/2017  . Influenza,inj,Quad PF,6+ Mos 12/29/2013, 11/07/2014  . Influenza,inj,quad, With Preservative 12/11/2016  . Pneumococcal Conjugate-13 11/12/2015  . Pneumococcal Polysaccharide-23 01/17/2011  . Tdap 04/28/2007, 10/05/2017  . Zoster 01/17/2011  . Zoster Recombinat (Shingrix) 07/11/2016, 09/29/2016   Health Maintenance Due  Topic Date Due  . PNA vac Low Risk Adult (2 of 2 - PPSV23) 11/11/2016    Last colonoscopy: 03-14-15 Last PSA:04/24/17 Dentist: 09/22/18 Ophtho: 09-21-18 Exercise: yard work , staying active  Other doctors caring for patient include: [name] [name]. [name] [name]  GI  Advanced Directives:YES   COPY ASKED FOR    Depression screen:  See questionnaire below.     Depression screen [facility] 2/9 09/30/2018 01/14/2018 09/03/2017 06/24/2016 03/07/2013  Decreased Interest 0 0 0 0 0  Down, Depressed, Hopeless 0 0 0 0 0  PHQ - 2 Score 0 0 0 0 0    Fall Screen: See Questionaire below.   Fall Risk  09/30/2018 01/14/2018 10/14/2017 09/03/2017 06/24/2016  Falls in the past year? 0 No No No No  Comment - - Emmi Telephone Survey: data to providers  prior to load - -    ADL screen:  See questionnaire below.  Functional Status Survey: Is the patient deaf or have difficulty hearing?: No Does the patient have difficulty seeing, even when wearing glasses/contacts?: No Does the patient have difficulty concentrating, remembering, or making decisions?: No Does the patient have difficulty walking or climbing stairs?: No Does the patient have difficulty dressing or bathing?: No Does the patient have difficulty doing errands alone such as visiting a doctor's office or shopping?: No   Review of Systems  Constitutional: -, -unexpected weight change, -anorexia, -fatigue Allergy: -sneezing, -itching, -congestion Dermatology: denies changing moles, rash, lumps ENT: -runny nose, -ear pain, -sore throat,  Cardiology:  -chest pain, -palpitations, -orthopnea, Respiratory: -cough, -shortness of breath, -dyspnea on exertion, -wheezing,  Gastroenterology: -abdominal pain, -nausea, -vomiting, -diarrhea, -constipation, -dysphagia Hematology: -bleeding or bruising problems Musculoskeletal: -arthralgias, -myalgias, -joint swelling, -back pain, - Ophthalmology: -vision changes,  Urology: -dysuria, -difficulty urinating,  -urinary frequency, -urgency, incontinence Neurology: -, -numbness, , -memory loss, -falls, -dizziness    PHYSICAL EXAM:  There were no vitals taken for this visit.  General Appearance: Alert, cooperative, no distress, appears stated age Head: Normocephalic, without obvious abnormality, atraumatic Eyes: PERRL, conjunctiva/corneas clear, EOM's intact, fundi benign Ears: Normal TM's and external ear canals Nose: Nares normal, mucosa normal, no drainage or sinus   tenderness Throat: Lips, mucosa, and tongue normal; teeth and gums normal Neck: Supple, no lymphadenopathy, thyroid:no enlargement/tenderness/nodules; no carotid bruit or JVD Lungs: Clear to auscultation bilaterally without wheezes, rales or ronchi;  respirations  unlabored Heart: Regular rate and rhythm, S1 and S2 normal, no murmur, rub or gallop Abdomen: Soft, non-tender, nondistended, normoactive bowel sounds, no masses, no hepatosplenomegaly Extremities: No clubbing, cyanosis or edema Pulses: 2+ and symmetric all extremities Skin: Skin color, texture, turgor normal, no rashes or lesions Lymph nodes: Cervical, supraclavicular, and axillary nodes normal Neurologic: CNII-XII intact, normal strength, sensation and gait; reflexes 2+ and symmetric throughout   Psych: Normal mood, affect, hygiene and grooming  ASSESSMENT/[name]: Hyperlipidemia with target LDL less than 100 - [name]: Lipid panel, simvastatin (ZOCOR) 20 MG tablet,   Essential hypertension - [name]: CBC with Differential/Platelet, Comprehensive metabolic panel, valsartan-hydrochlorothiazide (DIOVAN-HCT) 320-12.5 MG tablet, amLODipine (NORVASC) 10 MG tablet,   Hypogonadism male - [name]: PSA, Testosterone, Testosterone (ANDROGEL PUMP) 20.25 MG/ACT (1.62%) GEL,   Sleep apnea, unspecified type - [name]: Continue on CPAP and also check on getting a new machine.  History of hyperparathyroidism - [name]: PTH, Intact and Calcium,   Erectile dysfunction, unspecified Erectile dysfunction type - [name]: sildenafil (REVATIO) 20 MG tablet,   Obesity (BMI 30-39.9) - [name]: Encouraged him to become more physically active.  Encounter for prostate cancer screening - [name]: PSA,   Recommend that he check in with his CPAP company to see about getting a new machine as it is been well over 10 years.  Colonoscopy recommendations reviewed.   Medicare Attestation I have personally reviewed: The patient's medical and social history Their use of alcohol, tobacco or illicit drugs Their current medications and supplements The patient's functional ability including ADLs,fall risks, home safety risks, cognitive, and hearing and visual impairment Diet and physical activities Evidence for depression or mood disorders  The  patient's weight, height, and BMI have been recorded in the chart.  I have made referrals, counseling, and provided education to the patient based on review of the above and I have provided the patient with a written personalized care [name] for preventive services.     Jill Alexanders, MD   09/30/2018

## 2018-10-01 LAB — LIPID PANEL
Chol/HDL Ratio: 4.1 ratio (ref 0.0–5.0)
Cholesterol, Total: 172 mg/dL (ref 100–199)
HDL: 42 mg/dL (ref >39)
LDL Calculated: 102 mg/dL — ABNORMAL HIGH (ref 0–99)
Triglycerides: 139 mg/dL (ref 0–149)
VLDL Cholesterol Cal: 28 mg/dL (ref 5–40)

## 2018-10-01 LAB — COMPREHENSIVE METABOLIC PANEL
ALT: 23 IU/L (ref 0–44)
AST: 25 IU/L (ref 0–40)
Albumin/Globulin Ratio: 2.2 (ref 1.2–2.2)
Albumin: 4.4 g/dL (ref 3.8–4.8)
Alkaline Phosphatase: 59 IU/L (ref 39–117)
BUN/Creatinine Ratio: 17 (ref 10–24)
BUN: 18 mg/dL (ref 8–27)
Bilirubin Total: 1.7 mg/dL — ABNORMAL HIGH (ref 0.0–1.2)
CO2: 25 mmol/L (ref 20–29)
Calcium: 9.4 mg/dL (ref 8.6–10.2)
Chloride: 101 mmol/L (ref 96–106)
Creatinine, Ser: 1.04 mg/dL (ref 0.76–1.27)
GFR calc Af Amer: 85 mL/min/{1.73_m2} (ref >59)
GFR calc non Af Amer: 73 mL/min/{1.73_m2} (ref >59)
Globulin, Total: 2 g/dL (ref 1.5–4.5)
Glucose: 150 mg/dL — ABNORMAL HIGH (ref 65–99)
Potassium: 3.2 mmol/L — ABNORMAL LOW (ref 3.5–5.2)
Sodium: 143 mmol/L (ref 134–144)
Total Protein: 6.4 g/dL (ref 6.0–8.5)

## 2018-10-01 LAB — CBC WITH DIFFERENTIAL/PLATELET
Basophils Absolute: 0.1 10*3/uL (ref 0.0–0.2)
Basos: 1 %
EOS (ABSOLUTE): 0.2 10*3/uL (ref 0.0–0.4)
Eos: 4 %
Hematocrit: 51.5 % — ABNORMAL HIGH (ref 37.5–51.0)
Hemoglobin: 18.1 g/dL — ABNORMAL HIGH (ref 13.0–17.7)
Immature Grans (Abs): 0 10*3/uL (ref 0.0–0.1)
Immature Granulocytes: 0 %
Lymphocytes Absolute: 0.8 10*3/uL (ref 0.7–3.1)
Lymphs: 19 %
MCH: 29.9 pg (ref 26.6–33.0)
MCHC: 35.1 g/dL (ref 31.5–35.7)
MCV: 85 fL (ref 79–97)
Monocytes Absolute: 0.4 10*3/uL (ref 0.1–0.9)
Monocytes: 8 %
Neutrophils Absolute: 3.1 10*3/uL (ref 1.4–7.0)
Neutrophils: 68 %
Platelets: 123 10*3/uL — ABNORMAL LOW (ref 150–450)
RBC: 6.05 x10E6/uL — ABNORMAL HIGH (ref 4.14–5.80)
RDW: 12.1 % (ref 11.6–15.4)
WBC: 4.5 10*3/uL (ref 3.4–10.8)

## 2018-10-01 LAB — PTH, INTACT AND CALCIUM: PTH: 24 pg/mL (ref 15–65)

## 2018-10-01 LAB — PSA: Prostate Specific Ag, Serum: 1.8 ng/mL (ref 0.0–4.0)

## 2018-10-01 LAB — TESTOSTERONE: Testosterone: 471 ng/dL (ref 264–916)

## 2018-10-14 ENCOUNTER — Encounter: Payer: Self-pay | Admitting: Family Medicine

## 2018-10-14 DIAGNOSIS — E291 Testicular hypofunction: Secondary | ICD-10-CM

## 2018-10-14 MED ORDER — TESTOSTERONE 20.25 MG/ACT (1.62%) TD GEL: TRANSDERMAL | 2 refills | Status: DC

## 2018-11-10 ENCOUNTER — Telehealth: Payer: Self-pay | Admitting: Family Medicine

## 2018-11-10 NOTE — Telephone Encounter (Signed)
P.A. TESTOSTERONE (ANDROGEL PUMP) but we don't have correct insurance info.  Called pt and he states has Medicare part D [employer] and he doesn't know if they will pay at all, he using [facility] for $57 and will let me know if he wants me to pursue P.A.  Told him we need copy of new ins card

## 2018-11-18 DIAGNOSIS — Z23 Encounter for immunization: Secondary | ICD-10-CM | POA: Diagnosis not present

## 2019-02-03 ENCOUNTER — Other Ambulatory Visit: Payer: Self-pay | Admitting: Cardiology

## 2019-02-03 DIAGNOSIS — Z20822 Contact with and (suspected) exposure to covid-19: Secondary | ICD-10-CM

## 2019-02-07 LAB — NOVEL CORONAVIRUS, NAA: SARS-CoV-2, NAA: NOT DETECTED

## 2019-02-17 DIAGNOSIS — L814 Other melanin hyperpigmentation: Secondary | ICD-10-CM | POA: Diagnosis not present

## 2019-02-17 DIAGNOSIS — L718 Other rosacea: Secondary | ICD-10-CM | POA: Diagnosis not present

## 2019-02-17 DIAGNOSIS — D229 Melanocytic nevi, unspecified: Secondary | ICD-10-CM | POA: Diagnosis not present

## 2019-02-17 DIAGNOSIS — L570 Actinic keratosis: Secondary | ICD-10-CM | POA: Diagnosis not present

## 2019-02-17 DIAGNOSIS — D485 Neoplasm of uncertain behavior of skin: Secondary | ICD-10-CM | POA: Diagnosis not present

## 2019-02-17 DIAGNOSIS — L819 Disorder of pigmentation, unspecified: Secondary | ICD-10-CM | POA: Diagnosis not present

## 2019-02-17 DIAGNOSIS — D1801 Hemangioma of skin and subcutaneous tissue: Secondary | ICD-10-CM | POA: Diagnosis not present

## 2019-02-17 DIAGNOSIS — L821 Other seborrheic keratosis: Secondary | ICD-10-CM | POA: Diagnosis not present

## 2019-03-02 HISTORY — PX: SKIN BIOPSY: SHX1

## 2019-03-08 ENCOUNTER — Telehealth: Payer: Self-pay | Admitting: Family Medicine

## 2019-03-08 NOTE — Telephone Encounter (Signed)
Pt called about possible covid exposure  His in laws were tested Monday and got back positive results today  He went to their house yesterday for roughly 30 seconds with mask on Just to drop off groceries.  He had not close contact and he was wearing a mask  He has questions if he could have been exposed during that brief time  Before yesterday, his last contact with them was 2 weeks ago

## 2019-03-08 NOTE — Telephone Encounter (Signed)
Let him know that since he had his mask on and was there for less than a minute he has  nothing to worry about.

## 2019-03-08 NOTE — Telephone Encounter (Signed)
Pt was advised [name] [name] 

## 2019-03-17 ENCOUNTER — Other Ambulatory Visit: Payer: Self-pay | Admitting: Internal Medicine

## 2019-03-30 DIAGNOSIS — C44311 Basal cell carcinoma of skin of nose: Secondary | ICD-10-CM | POA: Diagnosis not present

## 2019-03-30 DIAGNOSIS — L570 Actinic keratosis: Secondary | ICD-10-CM | POA: Diagnosis not present

## 2019-04-06 DIAGNOSIS — L570 Actinic keratosis: Secondary | ICD-10-CM | POA: Diagnosis not present

## 2019-04-16 ENCOUNTER — Ambulatory Visit: Payer: Medicare Other

## 2019-04-21 ENCOUNTER — Ambulatory Visit: Payer: Medicare Other

## 2019-04-23 ENCOUNTER — Ambulatory Visit: Payer: Medicare Other

## 2019-04-25 ENCOUNTER — Ambulatory Visit: Payer: Medicare Other

## 2019-05-18 DIAGNOSIS — L570 Actinic keratosis: Secondary | ICD-10-CM | POA: Diagnosis not present

## 2019-05-18 DIAGNOSIS — L578 Other skin changes due to chronic exposure to nonionizing radiation: Secondary | ICD-10-CM | POA: Diagnosis not present

## 2019-05-23 ENCOUNTER — Encounter: Payer: Self-pay | Admitting: Family Medicine

## 2019-08-16 DIAGNOSIS — M9903 Segmental and somatic dysfunction of lumbar region: Secondary | ICD-10-CM | POA: Diagnosis not present

## 2019-08-16 DIAGNOSIS — M5442 Lumbago with sciatica, left side: Secondary | ICD-10-CM | POA: Diagnosis not present

## 2019-08-17 ENCOUNTER — Telehealth: Payer: Self-pay | Admitting: Family Medicine

## 2019-08-17 DIAGNOSIS — M9903 Segmental and somatic dysfunction of lumbar region: Secondary | ICD-10-CM | POA: Diagnosis not present

## 2019-08-17 DIAGNOSIS — M5442 Lumbago with sciatica, left side: Secondary | ICD-10-CM | POA: Diagnosis not present

## 2019-08-17 NOTE — Telephone Encounter (Signed)
Pt wife called and states that he is having back pain and is having a hard time laying down, and states that he had a xray and shows that he has back spurs, she wants to know if he can get a referral for a MRI and a orhtopedic or does he need to come in here first to be seen. She states she tried to call ortho and they could not get him in until June 21st, he can be reached at [phone]

## 2019-08-17 NOTE — Telephone Encounter (Signed)
Needs an appt

## 2019-08-17 NOTE — Progress Notes (Deleted)
  Patient presents for evaluation of back pain.  Wife called yesterday and reported he was having back pain and is having a hard time laying down.  Reported that he had a xray and shows that he has back spurs, was asking for a referral for MRI and a orthopedic eval. She states she tried to call ortho and they could not get him in until June 21st. Chart reviewed--no x-rays of his back in Epic.  Chart reviewed-- Labs 09/2018--fasting glu 150.  Reportedly added A1c, I don't see any results. He also has had low potassium on the last 2 checks, is on diuretic, no potassium Hg was high, 18.1 (and plt slightly low), patient on testosterone therapy  Due for yearly exam with JCL in July, not yet scheduled.

## 2019-08-17 NOTE — Telephone Encounter (Signed)
Our schedule is packed so I put him on Dr. Tomi Bamberger schedule. Please advise if anything needs to change [facility]

## 2019-08-18 ENCOUNTER — Ambulatory Visit: Payer: Medicare Other | Admitting: Family Medicine

## 2019-08-18 DIAGNOSIS — M545 Low back pain: Secondary | ICD-10-CM | POA: Diagnosis not present

## 2019-08-18 NOTE — Telephone Encounter (Signed)
Looks like he cancelled his appointment. He needs to schedule a fasting med check with Dr. Redmond School.  I had reviewed chart and discussed with JCL this morning--looks like the A1c was never done in 09/2018, and other labs were abnormal and due for recheck

## 2019-08-23 DIAGNOSIS — M545 Low back pain: Secondary | ICD-10-CM | POA: Diagnosis not present

## 2019-08-23 NOTE — Telephone Encounter (Signed)
Pt has made appt for med check and awv. [place]

## 2019-08-25 DIAGNOSIS — M545 Low back pain: Secondary | ICD-10-CM | POA: Diagnosis not present

## 2019-09-26 DIAGNOSIS — M545 Low back pain: Secondary | ICD-10-CM | POA: Diagnosis not present

## 2019-09-30 DIAGNOSIS — M545 Low back pain: Secondary | ICD-10-CM | POA: Diagnosis not present

## 2019-10-04 DIAGNOSIS — M545 Low back pain: Secondary | ICD-10-CM | POA: Diagnosis not present

## 2019-10-06 ENCOUNTER — Ambulatory Visit (INDEPENDENT_AMBULATORY_CARE_PROVIDER_SITE_OTHER): Payer: Medicare Other | Admitting: Family Medicine

## 2019-10-06 ENCOUNTER — Other Ambulatory Visit: Payer: Self-pay

## 2019-10-06 ENCOUNTER — Encounter: Payer: Self-pay | Admitting: Family Medicine

## 2019-10-06 VITALS — BP 142/82 | HR 71 | Temp 98.0°F | Wt 236.4 lb

## 2019-10-06 DIAGNOSIS — E785 Hyperlipidemia, unspecified: Secondary | ICD-10-CM | POA: Diagnosis not present

## 2019-10-06 DIAGNOSIS — G4730 Sleep apnea, unspecified: Secondary | ICD-10-CM | POA: Diagnosis not present

## 2019-10-06 DIAGNOSIS — M48062 Spinal stenosis, lumbar region with neurogenic claudication: Secondary | ICD-10-CM | POA: Diagnosis not present

## 2019-10-06 DIAGNOSIS — E669 Obesity, unspecified: Secondary | ICD-10-CM | POA: Diagnosis not present

## 2019-10-06 DIAGNOSIS — Z125 Encounter for screening for malignant neoplasm of prostate: Secondary | ICD-10-CM

## 2019-10-06 DIAGNOSIS — I10 Essential (primary) hypertension: Secondary | ICD-10-CM

## 2019-10-06 DIAGNOSIS — M4800 Spinal stenosis, site unspecified: Secondary | ICD-10-CM | POA: Insufficient documentation

## 2019-10-06 DIAGNOSIS — N529 Male erectile dysfunction, unspecified: Secondary | ICD-10-CM | POA: Diagnosis not present

## 2019-10-06 DIAGNOSIS — E119 Type 2 diabetes mellitus without complications: Secondary | ICD-10-CM | POA: Diagnosis not present

## 2019-10-06 DIAGNOSIS — E291 Testicular hypofunction: Secondary | ICD-10-CM | POA: Diagnosis not present

## 2019-10-06 DIAGNOSIS — M545 Low back pain: Secondary | ICD-10-CM | POA: Diagnosis not present

## 2019-10-06 LAB — POCT GLYCOSYLATED HEMOGLOBIN (HGB A1C): Hemoglobin A1C: 7 % — AB (ref 4.0–5.6)

## 2019-10-06 MED ORDER — AMLODIPINE BESYLATE 10 MG PO TABS: ORAL_TABLET | ORAL | 3 refills | Status: DC

## 2019-10-06 MED ORDER — VALSARTAN-HYDROCHLOROTHIAZIDE 320-12.5 MG PO TABS: 1.0000 | ORAL_TABLET | Freq: Every day | ORAL | 3 refills | Status: DC

## 2019-10-06 MED ORDER — TESTOSTERONE 20.25 MG/ACT (1.62%) TD GEL: TRANSDERMAL | 2 refills | Status: DC

## 2019-10-06 MED ORDER — SIMVASTATIN 20 MG PO TABS: 20.0000 mg | ORAL_TABLET | Freq: Every day | ORAL | 3 refills | Status: DC

## 2019-10-06 MED ORDER — SILDENAFIL CITRATE 20 MG PO TABS: ORAL_TABLET | ORAL | 3 refills | Status: DC

## 2019-10-06 NOTE — Progress Notes (Signed)
° °  Subjective:    Patient ID: [name] [name], male    DOB: 17-Jun-1950, 69 y.o.   MRN: [id number]  HPI He is here for a med check appointment.  He continues on testosterone for treatment of his hypogonadism and does note when he does not use it he does note increasing symptoms.  He is using sildenafil with good results.  He does use amlodipine as well as valsartan/HCTZ for his blood pressure and is comfortable with that.  Simvastatin is causing no difficulty with him.  He does have OSA and is doing quite nicely on CPAP.  He has not had a readout in quite some time.  He is having difficulty with back pain and is presently being seen by Dr. Maxie Better.  He has had physical therapy as well as epidural injections.  Apparently the MRI did show stenosis as well as protruding disc.  He is still being evaluated for this.  He does have hyperlipidemia and in the past has had intermittent elevations in blood sugar with a previous A1c of 5.8.  He has no other concerns or complaints.   Review of Systems     Objective:   Physical Exam Alert and in no distress. Tympanic membranes and canals are normal. Pharyngeal area is normal. Neck is supple without adenopathy or thyromegaly. Cardiac exam shows a regular sinus rhythm without murmurs or gallops. Lungs are clear to auscultation. Hemoglobin A1c is 7.0       Assessment & Plan:  Obesity (BMI 30-39.9) - Plan: Comprehensive metabolic panel, CBC with Differential/Platelet, Lipid panel  Sleep apnea, unspecified type  Hypogonadism male - Plan: Testosterone, Testosterone (ANDROGEL PUMP) 20.25 MG/ACT (1.62%) GEL  Essential hypertension - Plan: amLODipine (NORVASC) 10 MG tablet, valsartan-hydrochlorothiazide (DIOVAN-HCT) 320-12.5 MG tablet  Erectile dysfunction, unspecified erectile dysfunction type - Plan: sildenafil (REVATIO) 20 MG tablet  Hyperlipidemia with target LDL less than 100 - Plan: simvastatin (ZOCOR) 20 MG tablet  Encounter for prostate cancer screening -  Plan: PSA  New onset type 2 diabetes mellitus ([place]) - Plan: POCT glycosylated hemoglobin (Hb A1C)  Spinal stenosis, unspecified spinal region His medications were renewed.  He now has a diagnosis of diabetes.  I briefly discussed diet, exercise, foot care, eye care and medications.  He will return here in a couple weeks for recheck on all of this to go over in more detail.  He will continue to be followed by Dr. Maxie Better for his back discomfort.  Prescription written for him to get a new CPAP machine.  If it has been over 10 years.  He might need a new sleep study. Greater than 45 minutes spent in review of chart, examination and counseling.

## 2019-10-07 DIAGNOSIS — M545 Low back pain: Secondary | ICD-10-CM | POA: Diagnosis not present

## 2019-10-07 LAB — LIPID PANEL
Chol/HDL Ratio: 4 ratio (ref 0.0–5.0)
Cholesterol, Total: 206 mg/dL — ABNORMAL HIGH (ref 100–199)
HDL: 51 mg/dL (ref >39)
LDL Chol Calc (NIH): 131 mg/dL — ABNORMAL HIGH (ref 0–99)
Triglycerides: 133 mg/dL (ref 0–149)
VLDL Cholesterol Cal: 24 mg/dL (ref 5–40)

## 2019-10-07 LAB — CBC WITH DIFFERENTIAL/PLATELET
Basophils Absolute: 0 10*3/uL (ref 0.0–0.2)
Basos: 1 %
EOS (ABSOLUTE): 0.2 10*3/uL (ref 0.0–0.4)
Eos: 4 %
Hematocrit: 46.3 % (ref 37.5–51.0)
Hemoglobin: 15.6 g/dL (ref 13.0–17.7)
Immature Grans (Abs): 0 10*3/uL (ref 0.0–0.1)
Immature Granulocytes: 0 %
Lymphocytes Absolute: 1.1 10*3/uL (ref 0.7–3.1)
Lymphs: 20 %
MCH: 29.8 pg (ref 26.6–33.0)
MCHC: 33.7 g/dL (ref 31.5–35.7)
MCV: 88 fL (ref 79–97)
Monocytes Absolute: 0.6 10*3/uL (ref 0.1–0.9)
Monocytes: 10 %
Neutrophils Absolute: 3.8 10*3/uL (ref 1.4–7.0)
Neutrophils: 65 %
Platelets: 187 10*3/uL (ref 150–450)
RBC: 5.24 x10E6/uL (ref 4.14–5.80)
RDW: 12.6 % (ref 11.6–15.4)
WBC: 5.8 10*3/uL (ref 3.4–10.8)

## 2019-10-07 LAB — COMPREHENSIVE METABOLIC PANEL
ALT: 22 IU/L (ref 0–44)
AST: 24 IU/L (ref 0–40)
Albumin/Globulin Ratio: 2 (ref 1.2–2.2)
Albumin: 4.5 g/dL (ref 3.8–4.8)
Alkaline Phosphatase: 73 IU/L (ref 48–121)
BUN/Creatinine Ratio: 15 (ref 10–24)
BUN: 15 mg/dL (ref 8–27)
Bilirubin Total: 1.8 mg/dL — ABNORMAL HIGH (ref 0.0–1.2)
CO2: 26 mmol/L (ref 20–29)
Calcium: 9.4 mg/dL (ref 8.6–10.2)
Chloride: 100 mmol/L (ref 96–106)
Creatinine, Ser: 1 mg/dL (ref 0.76–1.27)
GFR calc Af Amer: 88 mL/min/{1.73_m2} (ref >59)
GFR calc non Af Amer: 76 mL/min/{1.73_m2} (ref >59)
Globulin, Total: 2.2 g/dL (ref 1.5–4.5)
Glucose: 136 mg/dL — ABNORMAL HIGH (ref 65–99)
Potassium: 3.4 mmol/L — ABNORMAL LOW (ref 3.5–5.2)
Sodium: 144 mmol/L (ref 134–144)
Total Protein: 6.7 g/dL (ref 6.0–8.5)

## 2019-10-07 LAB — TESTOSTERONE: Testosterone: 198 ng/dL — ABNORMAL LOW (ref 264–916)

## 2019-10-07 LAB — PSA: Prostate Specific Ag, Serum: 2.6 ng/mL (ref 0.0–4.0)

## 2019-10-11 DIAGNOSIS — M545 Low back pain: Secondary | ICD-10-CM | POA: Diagnosis not present

## 2019-10-12 ENCOUNTER — Encounter: Payer: Self-pay | Admitting: Family Medicine

## 2019-10-12 DIAGNOSIS — N529 Male erectile dysfunction, unspecified: Secondary | ICD-10-CM

## 2019-10-12 DIAGNOSIS — E291 Testicular hypofunction: Secondary | ICD-10-CM

## 2019-10-12 MED ORDER — SILDENAFIL CITRATE 20 MG PO TABS: ORAL_TABLET | ORAL | 3 refills | Status: DC

## 2019-10-12 MED ORDER — TESTOSTERONE 20.25 MG/ACT (1.62%) TD GEL: TRANSDERMAL | 5 refills | Status: DC

## 2019-10-14 DIAGNOSIS — M545 Low back pain: Secondary | ICD-10-CM | POA: Diagnosis not present

## 2019-10-18 DIAGNOSIS — M545 Low back pain: Secondary | ICD-10-CM | POA: Diagnosis not present

## 2019-10-26 ENCOUNTER — Ambulatory Visit (INDEPENDENT_AMBULATORY_CARE_PROVIDER_SITE_OTHER): Payer: Medicare Other | Admitting: Family Medicine

## 2019-10-26 ENCOUNTER — Other Ambulatory Visit: Payer: Self-pay

## 2019-10-26 VITALS — BP 142/90 | HR 59 | Temp 97.0°F | Wt 235.0 lb

## 2019-10-26 DIAGNOSIS — E119 Type 2 diabetes mellitus without complications: Secondary | ICD-10-CM

## 2019-10-26 NOTE — Progress Notes (Signed)
   Subjective:    Patient ID: [name] [name], male    DOB: [birth year], 69 y.o.   MRN: [id number]  HPI He is here for follow-up on recent diagnosis of diabetes.  His hemoglobin A1c was 7.0.  He has been doing some reading online concerning diabetes.   Review of Systems     Objective:   Physical Exam Alert and in no distress otherwise not examined       Assessment & Plan:  New onset type 2 diabetes mellitus ([place]) I discussed diet in regard to cutting back on carbohydrates and gave examples of that.  Discussed eating more frequent but smaller meals.  Also recommend 20 minutes of something physical on a daily basis.  I then discussed eye care, foot care, medications including medicines for blood pressure and cholesterol.  Explained that at this point we do not need to give a diabetes related medication.  All of his questions were answered.  He will return here in November for a wellness visit

## 2019-10-28 DIAGNOSIS — M545 Low back pain: Secondary | ICD-10-CM | POA: Diagnosis not present

## 2019-11-01 ENCOUNTER — Inpatient Hospital Stay (HOSPITAL_COMMUNITY)
Admission: EM | Admit: 2019-11-01 | Discharge: 2019-11-03 | DRG: 379 | Disposition: A | Discharge status: Home / Self Care | Payer: Medicare Other | Attending: Internal Medicine | Admitting: Internal Medicine

## 2019-11-01 ENCOUNTER — Other Ambulatory Visit: Payer: Self-pay

## 2019-11-01 ENCOUNTER — Encounter (HOSPITAL_COMMUNITY): Payer: Self-pay | Admitting: Emergency Medicine

## 2019-11-01 ENCOUNTER — Emergency Department (HOSPITAL_COMMUNITY): Payer: Medicare Other

## 2019-11-01 DIAGNOSIS — Z8249 Family history of ischemic heart disease and other diseases of the circulatory system: Secondary | ICD-10-CM

## 2019-11-01 DIAGNOSIS — Z7289 Other problems related to lifestyle: Secondary | ICD-10-CM

## 2019-11-01 DIAGNOSIS — M4800 Spinal stenosis, site unspecified: Secondary | ICD-10-CM | POA: Diagnosis present

## 2019-11-01 DIAGNOSIS — E119 Type 2 diabetes mellitus without complications: Secondary | ICD-10-CM | POA: Diagnosis not present

## 2019-11-01 DIAGNOSIS — K922 Gastrointestinal hemorrhage, unspecified: Secondary | ICD-10-CM

## 2019-11-01 DIAGNOSIS — E876 Hypokalemia: Secondary | ICD-10-CM | POA: Diagnosis present

## 2019-11-01 DIAGNOSIS — E669 Obesity, unspecified: Secondary | ICD-10-CM | POA: Diagnosis not present

## 2019-11-01 DIAGNOSIS — K5730 Diverticulosis of large intestine without perforation or abscess without bleeding: Secondary | ICD-10-CM | POA: Diagnosis not present

## 2019-11-01 DIAGNOSIS — K5731 Diverticulosis of large intestine without perforation or abscess with bleeding: Secondary | ICD-10-CM | POA: Diagnosis not present

## 2019-11-01 DIAGNOSIS — K5792 Diverticulitis of intestine, part unspecified, without perforation or abscess without bleeding: Secondary | ICD-10-CM

## 2019-11-01 DIAGNOSIS — Z79899 Other long term (current) drug therapy: Secondary | ICD-10-CM | POA: Diagnosis not present

## 2019-11-01 DIAGNOSIS — K5790 Diverticulosis of intestine, part unspecified, without perforation or abscess without bleeding: Secondary | ICD-10-CM

## 2019-11-01 DIAGNOSIS — Z86008 Personal history of in-situ neoplasm of other site: Secondary | ICD-10-CM

## 2019-11-01 DIAGNOSIS — Z79890 Hormone replacement therapy (postmenopausal): Secondary | ICD-10-CM

## 2019-11-01 DIAGNOSIS — Z9109 Other allergy status, other than to drugs and biological substances: Secondary | ICD-10-CM

## 2019-11-01 DIAGNOSIS — E785 Hyperlipidemia, unspecified: Secondary | ICD-10-CM | POA: Diagnosis present

## 2019-11-01 DIAGNOSIS — E1169 Type 2 diabetes mellitus with other specified complication: Secondary | ICD-10-CM | POA: Diagnosis present

## 2019-11-01 DIAGNOSIS — I10 Essential (primary) hypertension: Secondary | ICD-10-CM | POA: Diagnosis not present

## 2019-11-01 DIAGNOSIS — D3501 Benign neoplasm of right adrenal gland: Secondary | ICD-10-CM | POA: Diagnosis not present

## 2019-11-01 DIAGNOSIS — E213 Hyperparathyroidism, unspecified: Secondary | ICD-10-CM | POA: Diagnosis present

## 2019-11-01 DIAGNOSIS — E1159 Type 2 diabetes mellitus with other circulatory complications: Secondary | ICD-10-CM | POA: Diagnosis present

## 2019-11-01 DIAGNOSIS — Z7982 Long term (current) use of aspirin: Secondary | ICD-10-CM

## 2019-11-01 DIAGNOSIS — Z20822 Contact with and (suspected) exposure to covid-19: Secondary | ICD-10-CM | POA: Diagnosis not present

## 2019-11-01 DIAGNOSIS — Z83438 Family history of other disorder of lipoprotein metabolism and other lipidemia: Secondary | ICD-10-CM

## 2019-11-01 DIAGNOSIS — Z6832 Body mass index (BMI) 32.0-32.9, adult: Secondary | ICD-10-CM | POA: Diagnosis not present

## 2019-11-01 LAB — CBC WITH DIFFERENTIAL/PLATELET
Abs Immature Granulocytes: 0.03 10*3/uL (ref 0.00–0.07)
Basophils Absolute: 0 10*3/uL (ref 0.0–0.1)
Basophils Relative: 0 %
Eosinophils Absolute: 0 10*3/uL (ref 0.0–0.5)
Eosinophils Relative: 0 %
HCT: 33.9 % — ABNORMAL LOW (ref 39.0–52.0)
Hemoglobin: 11.6 g/dL — ABNORMAL LOW (ref 13.0–17.0)
Immature Granulocytes: 0 %
Lymphocytes Relative: 10 %
Lymphs Abs: 1 10*3/uL (ref 0.7–4.0)
MCH: 30.5 pg (ref 26.0–34.0)
MCHC: 34.2 g/dL (ref 30.0–36.0)
MCV: 89.2 fL (ref 80.0–100.0)
Monocytes Absolute: 0.5 10*3/uL (ref 0.1–1.0)
Monocytes Relative: 5 %
Neutro Abs: 8.9 10*3/uL — ABNORMAL HIGH (ref 1.7–7.7)
Neutrophils Relative %: 85 %
Platelets: 186 10*3/uL (ref 150–400)
RBC: 3.8 MIL/uL — ABNORMAL LOW (ref 4.22–5.81)
RDW: 12.9 % (ref 11.5–15.5)
WBC: 10.5 10*3/uL (ref 4.0–10.5)
nRBC: 0 % (ref 0.0–0.2)

## 2019-11-01 LAB — COMPREHENSIVE METABOLIC PANEL
ALT: 23 U/L (ref 0–44)
AST: 20 U/L (ref 15–41)
Albumin: 3.9 g/dL (ref 3.5–5.0)
Alkaline Phosphatase: 49 U/L (ref 38–126)
Anion gap: 9 (ref 5–15)
BUN: 26 mg/dL — ABNORMAL HIGH (ref 8–23)
CO2: 28 mmol/L (ref 22–32)
Calcium: 8.9 mg/dL (ref 8.9–10.3)
Chloride: 101 mmol/L (ref 98–111)
Creatinine, Ser: 1.11 mg/dL (ref 0.61–1.24)
GFR calc Af Amer: >60 mL/min (ref >60)
GFR calc non Af Amer: >60 mL/min (ref >60)
Glucose, Bld: 193 mg/dL — ABNORMAL HIGH (ref 70–99)
Potassium: 3 mmol/L — ABNORMAL LOW (ref 3.5–5.1)
Sodium: 138 mmol/L (ref 135–145)
Total Bilirubin: 2.2 mg/dL — ABNORMAL HIGH (ref 0.3–1.2)
Total Protein: 6.6 g/dL (ref 6.5–8.1)

## 2019-11-01 LAB — CBC
HCT: 38.2 % — ABNORMAL LOW (ref 39.0–52.0)
Hemoglobin: 12.7 g/dL — ABNORMAL LOW (ref 13.0–17.0)
MCH: 30 pg (ref 26.0–34.0)
MCHC: 33.2 g/dL (ref 30.0–36.0)
MCV: 90.1 fL (ref 80.0–100.0)
Platelets: 207 10*3/uL (ref 150–400)
RBC: 4.24 MIL/uL (ref 4.22–5.81)
RDW: 13 % (ref 11.5–15.5)
WBC: 11.9 10*3/uL — ABNORMAL HIGH (ref 4.0–10.5)
nRBC: 0 % (ref 0.0–0.2)

## 2019-11-01 LAB — TYPE AND SCREEN
ABO/RH(D): AB POS
Antibody Screen: NEGATIVE

## 2019-11-01 LAB — GLUCOSE, CAPILLARY: Glucose-Capillary: 163 mg/dL — ABNORMAL HIGH (ref 70–99)

## 2019-11-01 LAB — SARS CORONAVIRUS 2 BY RT PCR (HOSPITAL ORDER, PERFORMED IN ~~LOC~~ HOSPITAL LAB): SARS Coronavirus 2: NEGATIVE

## 2019-11-01 MED ORDER — IRBESARTAN 300 MG PO TABS
300.0000 mg | ORAL_TABLET | Freq: Every day | ORAL | Status: DC
  Administered 2019-11-03: 300 mg via ORAL
  Filled 2019-11-01 (×2): qty 1

## 2019-11-01 MED ORDER — ACETAMINOPHEN 650 MG RE SUPP: 650.0000 mg | Freq: Four times a day (QID) | RECTAL | Status: DC | PRN

## 2019-11-01 MED ORDER — HYDROCHLOROTHIAZIDE 12.5 MG PO CAPS
12.5000 mg | ORAL_CAPSULE | Freq: Every day | ORAL | Status: DC
  Administered 2019-11-02 – 2019-11-03 (×2): 12.5 mg via ORAL
  Filled 2019-11-01 (×2): qty 1

## 2019-11-01 MED ORDER — IOHEXOL 300 MG/ML  SOLN
100.0000 mL | Freq: Once | INTRAMUSCULAR | Status: AC | PRN
  Administered 2019-11-01: 100 mL via INTRAVENOUS

## 2019-11-01 MED ORDER — PANTOPRAZOLE SODIUM 40 MG IV SOLR
40.0000 mg | Freq: Once | INTRAVENOUS | Status: AC
  Administered 2019-11-01: 40 mg via INTRAVENOUS
  Filled 2019-11-01: qty 40

## 2019-11-01 MED ORDER — ONDANSETRON HCL 4 MG/2ML IJ SOLN: 4.0000 mg | Freq: Four times a day (QID) | INTRAMUSCULAR | Status: DC | PRN

## 2019-11-01 MED ORDER — TRAZODONE HCL 50 MG PO TABS: 50.0000 mg | ORAL_TABLET | Freq: Every evening | ORAL | Status: DC | PRN

## 2019-11-01 MED ORDER — INSULIN ASPART 100 UNIT/ML ~~LOC~~ SOLN
0.0000 [IU] | SUBCUTANEOUS | Status: DC
  Administered 2019-11-01 – 2019-11-02 (×2): 1 [IU] via SUBCUTANEOUS
  Filled 2019-11-01: qty 0.06

## 2019-11-01 MED ORDER — SIMVASTATIN 20 MG PO TABS
20.0000 mg | ORAL_TABLET | Freq: Every evening | ORAL | Status: DC
  Administered 2019-11-02: 20 mg via ORAL
  Filled 2019-11-01: qty 1

## 2019-11-01 MED ORDER — SODIUM CHLORIDE 0.9 % IV SOLN
INTRAVENOUS | Status: DC
  Administered 2019-11-03: 100 mL/h via INTRAVENOUS

## 2019-11-01 MED ORDER — PANTOPRAZOLE SODIUM 40 MG IV SOLR
40.0000 mg | INTRAVENOUS | Status: DC
  Administered 2019-11-02 – 2019-11-03 (×2): 40 mg via INTRAVENOUS
  Filled 2019-11-01 (×2): qty 40

## 2019-11-01 MED ORDER — SODIUM CHLORIDE 0.9% FLUSH
3.0000 mL | Freq: Two times a day (BID) | INTRAVENOUS | Status: DC
  Administered 2019-11-01 – 2019-11-03 (×2): 3 mL via INTRAVENOUS

## 2019-11-01 MED ORDER — AMLODIPINE BESYLATE 10 MG PO TABS
10.0000 mg | ORAL_TABLET | Freq: Every day | ORAL | Status: DC
  Administered 2019-11-02 – 2019-11-03 (×2): 10 mg via ORAL
  Filled 2019-11-01 (×2): qty 1

## 2019-11-01 MED ORDER — ACETAMINOPHEN 325 MG PO TABS: 650.0000 mg | ORAL_TABLET | Freq: Four times a day (QID) | ORAL | Status: DC | PRN

## 2019-11-01 MED ORDER — HYDROMORPHONE HCL 1 MG/ML IJ SOLN: 0.5000 mg | INTRAMUSCULAR | Status: DC | PRN

## 2019-11-01 MED ORDER — PEG 3350-KCL-NA BICARB-NACL 420 G PO SOLR
4000.0000 mL | Freq: Once | ORAL | Status: AC
  Administered 2019-11-01: 4000 mL via ORAL
  Filled 2019-11-01: qty 4000

## 2019-11-01 MED ORDER — POTASSIUM CHLORIDE 10 MEQ/100ML IV SOLN
10.0000 meq | INTRAVENOUS | Status: AC
  Administered 2019-11-01 – 2019-11-02 (×4): 10 meq via INTRAVENOUS
  Filled 2019-11-01 (×3): qty 100

## 2019-11-01 MED ORDER — ONDANSETRON HCL 4 MG PO TABS: 4.0000 mg | ORAL_TABLET | Freq: Four times a day (QID) | ORAL | Status: DC | PRN

## 2019-11-01 MED ORDER — VALSARTAN-HYDROCHLOROTHIAZIDE 320-12.5 MG PO TABS: 1.0000 | ORAL_TABLET | Freq: Every day | ORAL | Status: DC

## 2019-11-01 NOTE — ED Provider Notes (Signed)
[facility] DEPT Provider Note   CSN: [id number] Arrival date & time: 11/01/19  1607     History Chief Complaint  Patient presents with  . Rectal Bleeding    [name] [name] is a 69 y.o. male.  HPI   69 year old male with a history of dyslipidemia, ED, hyperparathyroidism, hypertension, presents to the emergency department today for evaluation of rectal bleeding.  States that since this morning he has had multiple episodes of bright red blood per rectum.  Estimates he has had about 6-7 episodes total.  States that it is straight blood in it is not mixed with stool.  He denies any melena or dark/tarry stools.  He is not currently have any abdominal pain but he does have intermittent discomfort right before he has a bowel movement.  He has had some associated nausea but no episodes of vomiting.  Denies any fevers or urinary symptoms.  He did feel somewhat lightheaded at home but did not have a syncopal episode.  States he has had this happen in the past and it was related to his diverticulosis.  States he used to be on aspirin however he discontinued it several days ago.  He also used to take naproxen frequently for lower back pain however he has not taken any in about 1 month.  He does not drink several beers daily.  Past Medical History:  Diagnosis Date  . Dyslipidemia   . ED (erectile dysfunction)   . Hyperlipidemia   . Hyperparathyroidism   . Hypertension   . Hypogonadism, male   . Sleep apnea     Patient Active Problem List   Diagnosis Date Noted  . Acute lower GI bleeding 11/01/2019  . New onset type 2 diabetes mellitus ([place]) 10/06/2019  . Spinal stenosis 10/06/2019  . Hypertension 01/17/2011  . Hyperlipidemia with target LDL less than 100 01/17/2011  . Hypogonadism male 01/17/2011  . Sleep apnea 01/17/2011  . History of hyperparathyroidism 01/17/2011  . ED (erectile dysfunction) 01/17/2011  . Obesity (BMI 30-39.9) 01/17/2011    Past  Surgical History:  Procedure Laterality Date  . PARATHYROIDECTOMY    . SKIN BIOPSY Left 03/02/2019   squamous cell carcinoma in situ, hypertrophic, traumatized       Family History  Problem Relation Age of Onset  . Hypertension Father   . Hyperlipidemia Father     Social History   Tobacco Use  . Smoking status: Never Smoker  . Smokeless tobacco: Never Used  Substance Use Topics  . Alcohol use: Yes    Alcohol/week: 14.0 standard drinks    Types: 14 Cans of beer per week  . Drug use: No    Home Medications Prior to Admission medications   Medication Sig Start Date End Date Taking? Authorizing Provider  amLODipine (NORVASC) 10 MG tablet TAKE 1 TABLET BY MOUTH EVERY DAY Patient taking differently: Take 10 mg by mouth daily.  10/06/19  Yes Denita Lung, MD  aspirin 325 MG tablet Take 325 mg by mouth daily.     Yes [provider]  EPINEPHrine (EPI-PEN) 0.3 mg/0.3 mL DEVI Inject 0.3 mLs (0.3 mg total) into the muscle once. 03/14/14  Yes Denita Lung, MD  fish oil-omega-3 fatty acids 1000 MG capsule Take 2 g by mouth daily.     Yes [provider]  Glucosamine-Chondroit-Vit C-Mn (GLUCOSAMINE 1500 COMPLEX) CAPS Take 1 capsule by mouth daily.    Yes [provider]  Multiple Vitamins-Minerals (MULTIVITAMIN WITH MINERALS) tablet Take  1 tablet by mouth daily.     Yes [provider]  sildenafil (REVATIO) 20 MG tablet Take 1 to 5 pills as needed daily Patient taking differently: Take 20-100 mg by mouth daily as needed (erectile dysfunction).  10/12/19  Yes Denita Lung, MD  simvastatin (ZOCOR) 20 MG tablet Take 1 tablet (20 mg total) by mouth daily. Patient taking differently: Take 20 mg by mouth every evening.  10/06/19  Yes Denita Lung, MD  Testosterone (ANDROGEL PUMP) 20.25 MG/ACT (1.62%) GEL APPLY 2 PUMPS EVERY DAY TO SHOULDER AND ARM Patient taking differently: Apply 40.5 mg topically daily. Apply 2 pumps (40.50mg ) topically to shoulder  and arm. 10/12/19  Yes Denita Lung, MD  valsartan-hydrochlorothiazide (DIOVAN-HCT) 320-12.5 MG tablet Take 1 tablet by mouth daily. 10/06/19  Yes Denita Lung, MD    Allergies    Yellow jacket venom  Review of Systems   Review of Systems  Constitutional: Negative for fever.  HENT: Negative for ear pain and sore throat.   Eyes: Negative for visual disturbance.  Respiratory: Negative for cough and shortness of breath.   Cardiovascular: Negative for chest pain.  Gastrointestinal: Positive for abdominal pain and blood in stool. Negative for constipation, diarrhea, nausea and vomiting.  Genitourinary: Negative for dysuria and hematuria.  Musculoskeletal: Negative for back pain.  Skin: Negative for rash.  Neurological: Positive for light-headedness.  All other systems reviewed and are negative.   Physical Exam Updated Vital Signs BP (!) 141/73   Pulse 70   Temp 98.1 F (36.7 C) (Oral)   Resp 14   Ht 5\' 11"  (1.803 m)   Wt 106.6 kg   SpO2 96%   BMI 32.78 kg/m   Physical Exam Vitals and nursing note reviewed.  Constitutional:      Appearance: He is well-developed.  HENT:     Head: Normocephalic and atraumatic.  Eyes:     Conjunctiva/sclera: Conjunctivae normal.     Comments: Pale conjunctiva  Cardiovascular:     Rate and Rhythm: Normal rate and regular rhythm.     Heart sounds: Normal heart sounds. No murmur heard.   Pulmonary:     Effort: Pulmonary effort is normal. No respiratory distress.     Breath sounds: Normal breath sounds. No wheezing, rhonchi or rales.  Abdominal:     General: Bowel sounds are normal.     Palpations: Abdomen is soft.     Tenderness: There is no abdominal tenderness. There is no guarding or rebound.  Genitourinary:    Comments: DRE performed. Bright red blood noted in the rectal vault. No stool noted. No melena. Musculoskeletal:     Cervical back: Neck supple.  Skin:    General: Skin is warm and dry.  Neurological:     Mental Status:  He is alert.     ED Results / Procedures / Treatments   Labs (all labs ordered are listed, but only abnormal results are displayed) Labs Reviewed  COMPREHENSIVE METABOLIC PANEL - Abnormal; Notable for the following components:      Result Value   Potassium 3.0 (*)    Glucose, Bld 193 (*)    BUN 26 (*)    Total Bilirubin 2.2 (*)    All other components within normal limits  CBC - Abnormal; Notable for the following components:   WBC 11.9 (*)    Hemoglobin 12.7 (*)    HCT 38.2 (*)    All other components within normal limits  SARS CORONAVIRUS 2 BY  RT PCR (HOSPITAL ORDER, [facility])  CBC WITH DIFFERENTIAL/PLATELET  COMPREHENSIVE METABOLIC PANEL  POC OCCULT BLOOD, ED  TYPE AND SCREEN    EKG None  Radiology CT ABDOMEN PELVIS W CONTRAST  Result Date: 11/01/2019 CLINICAL DATA:  Bright red blood per rectum, history of diverticulitis EXAM: CT ABDOMEN AND PELVIS WITH CONTRAST TECHNIQUE: Multidetector CT imaging of the abdomen and pelvis was performed using the standard protocol following bolus administration of intravenous contrast. CONTRAST:  175mL OMNIPAQUE IOHEXOL 300 MG/ML  SOLN COMPARISON:  None. FINDINGS: Lower chest: No acute pleural or parenchymal lung disease. Trace pericardial effusion. Hepatobiliary: No focal liver abnormality is seen. No gallstones, gallbladder wall thickening, or biliary dilatation. Pancreas: Unremarkable. No pancreatic ductal dilatation or surrounding inflammatory changes. Spleen: Normal in size without focal abnormality. Adrenals/Urinary Tract: 2.4 cm right adrenal myelolipoma. Left adrenal is unremarkable. Numerous bilateral renal hypodensities are identified compatible with multiple cysts. No urinary tract calculi or obstructive uropathy. Bladder is minimally distended with no focal abnormalities. Stomach/Bowel: There is diffuse diverticulosis of the colon with no evidence of acute diverticulitis. Normal retrocecal appendix. No  bowel wall thickening or inflammatory change. Vascular/Lymphatic: Aortic atherosclerosis. No enlarged abdominal or pelvic lymph nodes. Reproductive: Prostate is unremarkable. Other: No free fluid or free gas. Small fat containing umbilical hernia. No bowel herniation. Musculoskeletal: No acute or destructive bony lesions. Reconstructed images demonstrate no additional findings. IMPRESSION: 1. Diffuse colonic diverticulosis without diverticulitis. 2. Trace pericardial effusion. 3. Benign right adrenal myelolipoma. 4. Aortic Atherosclerosis (ICD10-I70.0). Electronically Signed   By: Randa Ngo M.D.   On: 11/01/2019 19:01    Procedures Procedures (including critical care time)  Medications Ordered in ED Medications  insulin aspart (novoLOG) injection 0-6 Units (has no administration in time range)  potassium chloride 10 mEq in 100 mL IVPB (has no administration in time range)  iohexol (OMNIPAQUE) 300 MG/ML solution 100 mL (100 mLs Intravenous Contrast Given 11/01/19 1818)  pantoprazole (PROTONIX) injection 40 mg (40 mg Intravenous Given 11/01/19 1952)    ED Course  I have reviewed the triage vital signs and the nursing notes.  Pertinent labs & imaging results that were available during my care of the patient were reviewed by me and considered in my medical decision making (see chart for details).    MDM Rules/Calculators/A&P                          69 year old male presenting for evaluation of rectal bleeding starting today.  Has had 6-7 episodes of bright red blood per rectum.  Has history of diverticulosis with rectal bleeding associated with this several times in the past.  Patient is not tachycardic.  He did have 1 documented episode of hypotension however I think that this was erroneous as the remainder of his blood pressures have been normal.  Reviewed his laboratory work  CBC shows some anemia which is new.  Hemoglobin is dropped about 3 points in the last month.  He also has a mild  leukocytosis CMP Mild hypokalemia, elevated BUN at 26 and normal creatinine and LFTs. Hemoccult has not resulted in chart however was obviously grossly positive on my exam Covid test negative  CT abdomen/pelvis did not show any evidence of diverticulitis or other explanation for patient's symptoms but did show evidence of diverticulosis.  CONSULT with GI was completed. Dr. Tarri Glenn is aware of the patient and GI will see the patient tomorrow.   CONSULT with Dr. Dione Plover  with hospitalist service who accepts patient for admission.    Final Clinical Impression(s) / ED Diagnoses Final diagnoses:  Acute GI bleeding    Rx / DC Orders ED Discharge Orders    None       Rodney Booze, PA-C 11/01/19 [place], [place], DO 11/01/19 2329

## 2019-11-01 NOTE — H&P (Addendum)
[facility] History and Physical  [name] [name] KCL:[id number] DOB: 01-11-1951 DOA: 11/01/2019  Referring physician: Dr. [name] PCP: Denita Lung, MD   Chief Complaint: rectal bleeding  HPI: [name] [name] is a 69 y.o. male with history of hypertension, hyperlipidemia, obesity, diverticulosis, 2 diabetes, who presents with rectal bleeding.  Patient presents to the ED today reporting multiple episodes of bloody bowel movements.  Reports that this morning he woke up with an urge to go to the bathroom and had a bloody bowel movement.  He subsequently had multiple bloody bowel movements that he presumed was due to his diverticulosis.  He was not planning to present except that he then began to feel lightheaded and dizzy and after which he presented to the ED.  He denies any pain with bowel movements, though has had a churning sensation in his stomach.  Denies fevers, denies nausea vomiting.  He does not regularly take any NSAIDs.  Review of chart shows patient had a colonoscopy in December 2016, multiple small and large mouth diverticula were found at that time but the exam was otherwise normal.  In the ED work-up notable for normal vital signs, CMP showing mild hypohypokalemia of 3.0 but otherwise unremarkable, a CBC with hemoglobin dropped from 15-12 since last check, and a CT abdomen pelvis that showed diffuse colonic diverticulosis without diverticulitis.  He was admitted for further management of presumed lower GI bleed.   Review of Systems:  Pertinent positives and negative per HPI, all others reviewed and negative  Past Medical History:  Diagnosis Date  . Dyslipidemia   . ED (erectile dysfunction)   . Hyperlipidemia   . Hyperparathyroidism   . Hypertension   . Hypogonadism, male   . Sleep apnea    Past Surgical History:  Procedure Laterality Date  . PARATHYROIDECTOMY    . SKIN BIOPSY Left 03/02/2019   squamous cell carcinoma in situ, hypertrophic, traumatized    Social History:  reports that he has never smoked. He has never used smokeless tobacco. He reports current alcohol use of about 14.0 standard drinks of alcohol per week. He reports that he does not use drugs.  Allergies  Allergen Reactions  . Yellow Jacket Venom Anaphylaxis    Family History  Problem Relation Age of Onset  . Hypertension Father   . Hyperlipidemia Father     Prior to Admission medications   Medication Sig Start Date End Date Taking? Authorizing Provider  amLODipine (NORVASC) 10 MG tablet TAKE 1 TABLET BY MOUTH EVERY DAY Patient taking differently: Take 10 mg by mouth daily.  10/06/19  Yes Denita Lung, MD  aspirin 325 MG tablet Take 325 mg by mouth daily.     Yes [provider]  EPINEPHrine (EPI-PEN) 0.3 mg/0.3 mL DEVI Inject 0.3 mLs (0.3 mg total) into the muscle once. 03/14/14  Yes Denita Lung, MD  fish oil-omega-3 fatty acids 1000 MG capsule Take 2 g by mouth daily.     Yes [provider]  Glucosamine-Chondroit-Vit C-Mn (GLUCOSAMINE 1500 COMPLEX) CAPS Take 1 capsule by mouth daily.    Yes [provider]  Multiple Vitamins-Minerals (MULTIVITAMIN WITH MINERALS) tablet Take 1 tablet by mouth daily.     Yes [provider]  sildenafil (REVATIO) 20 MG tablet Take 1 to 5 pills as needed daily Patient taking differently: Take 20-100 mg by mouth daily as needed (erectile dysfunction).  10/12/19  Yes Denita Lung, MD  simvastatin (ZOCOR) 20 MG tablet Take 1 tablet (20  mg total) by mouth daily. Patient taking differently: Take 20 mg by mouth every evening.  10/06/19  Yes Denita Lung, MD  Testosterone (ANDROGEL PUMP) 20.25 MG/ACT (1.62%) GEL APPLY 2 PUMPS EVERY DAY TO SHOULDER AND ARM Patient taking differently: Apply 40.5 mg topically daily. Apply 2 pumps (40.50mg ) topically to shoulder and arm. 10/12/19  Yes Denita Lung, MD  valsartan-hydrochlorothiazide (DIOVAN-HCT) 320-12.5 MG tablet Take 1 tablet by mouth daily. 10/06/19   Yes Denita Lung, MD   Physical Exam: Vitals:   11/01/19 1727 11/01/19 1800 11/01/19 1914 11/01/19 1919  BP: (!) 88/57 118/71 128/80 128/80  Pulse: 74 65 67 69  Resp: 16 16 15    Temp:      TempSrc:      SpO2: 96% 97% 95% 96%  Weight:      Height:        Wt Readings from Last 3 Encounters:  11/01/19 106.6 kg  10/26/19 106.6 kg  10/06/19 (!) 107.2 kg    General:  Appears calm and comfortable Eyes: PERRL, normal lids, irises & conjunctiva ENT: grossly normal hearing, lips & tongue Neck: no LAD, masses or thyromegaly Cardiovascular: RRR, no m/r/g. No LE edema. Respiratory: CTA bilaterally, no w/r/r. Normal respiratory effort. Abdomen: soft, ntnd Skin: no rash or induration seen on limited exam Musculoskeletal: grossly normal tone BUE/BLE Psychiatric: grossly normal mood and affect, speech fluent and appropriate Neurologic: grossly non-focal.          Labs on Admission:  Basic Metabolic Panel: Recent Labs  Lab 11/01/19 1624  NA 138  K 3.0*  CL 101  CO2 28  GLUCOSE 193*  BUN 26*  CREATININE 1.11  CALCIUM 8.9   Liver Function Tests: Recent Labs  Lab 11/01/19 1624  AST 20  ALT 23  ALKPHOS 49  BILITOT 2.2*  PROT 6.6  ALBUMIN 3.9   No results for input(s): LIPASE, AMYLASE in the last 168 hours. No results for input(s): AMMONIA in the last 168 hours. CBC: Recent Labs  Lab 11/01/19 1624  WBC 11.9*  HGB 12.7*  HCT 38.2*  MCV 90.1  PLT 207   Cardiac Enzymes: No results for input(s): CKTOTAL, CKMB, CKMBINDEX, TROPONINI in the last 168 hours.  BNP (last 3 results) No results for input(s): BNP in the last 8760 hours.  ProBNP (last 3 results) No results for input(s): PROBNP in the last 8760 hours.  CBG: No results for input(s): GLUCAP in the last 168 hours.  Radiological Exams on Admission: CT ABDOMEN PELVIS W CONTRAST  Result Date: 11/01/2019 CLINICAL DATA:  Bright red blood per rectum, history of diverticulitis EXAM: CT ABDOMEN AND PELVIS  WITH CONTRAST TECHNIQUE: Multidetector CT imaging of the abdomen and pelvis was performed using the standard protocol following bolus administration of intravenous contrast. CONTRAST:  140mL OMNIPAQUE IOHEXOL 300 MG/ML  SOLN COMPARISON:  None. FINDINGS: Lower chest: No acute pleural or parenchymal lung disease. Trace pericardial effusion. Hepatobiliary: No focal liver abnormality is seen. No gallstones, gallbladder wall thickening, or biliary dilatation. Pancreas: Unremarkable. No pancreatic ductal dilatation or surrounding inflammatory changes. Spleen: Normal in size without focal abnormality. Adrenals/Urinary Tract: 2.4 cm right adrenal myelolipoma. Left adrenal is unremarkable. Numerous bilateral renal hypodensities are identified compatible with multiple cysts. No urinary tract calculi or obstructive uropathy. Bladder is minimally distended with no focal abnormalities. Stomach/Bowel: There is diffuse diverticulosis of the colon with no evidence of acute diverticulitis. Normal retrocecal appendix. No bowel wall thickening or inflammatory change. Vascular/Lymphatic: Aortic atherosclerosis. No enlarged abdominal  or pelvic lymph nodes. Reproductive: Prostate is unremarkable. Other: No free fluid or free gas. Small fat containing umbilical hernia. No bowel herniation. Musculoskeletal: No acute or destructive bony lesions. Reconstructed images demonstrate no additional findings. IMPRESSION: 1. Diffuse colonic diverticulosis without diverticulitis. 2. Trace pericardial effusion. 3. Benign right adrenal myelolipoma. 4. Aortic Atherosclerosis (ICD10-I70.0). Electronically Signed   By: Randa Ngo M.D.   On: 11/01/2019 19:01    EKG: Not obtained  Assessment/Plan Active Problems:   Hypertension   Hyperlipidemia with target LDL less than 100   Obesity (BMI 30-39.9)   New onset type 2 diabetes mellitus ([place])   Spinal stenosis   Acute lower GI bleeding  #Lower GI Bleed #Diverticulosis Patient presenting  with acute bright red bleeding per rectum, likely secondary to known presence of significant diverticular disease.  CT abdomen pelvis without any acute findings.  Low suspicion for upper GI bleed given lack of melena.  Patient's hemoglobin has dropped from 15.6 last month to 12.7 today, will admit for GI evaluation.  GI consulted in the ED, per discussion with ED provider it is reasonable from their perspective to start prep for possible colonoscopy in the a.m., discussed with patient who is unable to this plan.  -Clear liquid diet until midnight, n.p.o. after midnight -Start colonoscopy prep this evening -Type and screen active -IV PPI daily -2 large-bore IV -Trend CBC every 6h until stable -Follow-up GI recommendations in the a.m.  #Hypokalemia K 3.0 on admission, will replete IV.   #Chronic Medical Problems Hypertension: Continue amlodipine, valsartan, hydrochlorothiazide Hyperlipidemia: Continue simvastatin DM2: newly diagnosed, A1c 7% on 10/06/2019, q4h CBG with very sensitive ISS given NPO  Code Status: Full Code presumed DVT Prophylaxis: SCDs Family Communication: patient declined Disposition Plan: Obs  Time spent: 50 min  Clarnce Flock MD/[facility]

## 2019-11-01 NOTE — ED Provider Notes (Signed)
Medical screening examination/treatment/procedure(s) were conducted as a shared visit with non-physician practitioner(s) and myself.  I personally evaluated the patient during the encounter. Briefly, the patient is a 69 y.o. male with history of hypertension, high cholesterol who presents to the ED with rectal bleeding.  Not on blood thinners.  Unremarkable vitals.  Did have one blood pressure of 88/57 however has been normotensive otherwise.  Not tachycardic.  Has had bright red blood per rectum since 6 AM with about 6 or 7 bloody bowel movements during that time.  Hemoglobin is 12.7 and last month hemoglobin was about 15.  Has gross red blood on exam.  Might have a history of diverticulitis.  Does drink alcohol daily.  Does not have any abdominal discomfort at this time.  We will get a CT scan of the abdomen pelvis to evaluate for any type of colitis.  Anticipate admission given hemoglobin drop, ongoing bloody bowel movements.  No need for transfusion at this time.  This chart was dictated using voice recognition software.  Despite best efforts to proofread,  errors can occur which can change the documentation meaning.     EKG Interpretation None           Lennice Sites, DO 11/01/19 1746

## 2019-11-01 NOTE — ED Triage Notes (Signed)
Since 6 am today has bright red rectal bleeding. Reports about every hour. Hx diverticulitis.

## 2019-11-02 DIAGNOSIS — Z9109 Other allergy status, other than to drugs and biological substances: Secondary | ICD-10-CM | POA: Diagnosis not present

## 2019-11-02 DIAGNOSIS — E876 Hypokalemia: Secondary | ICD-10-CM | POA: Diagnosis present

## 2019-11-02 DIAGNOSIS — Z86008 Personal history of in-situ neoplasm of other site: Secondary | ICD-10-CM | POA: Diagnosis not present

## 2019-11-02 DIAGNOSIS — Z6832 Body mass index (BMI) 32.0-32.9, adult: Secondary | ICD-10-CM | POA: Diagnosis not present

## 2019-11-02 DIAGNOSIS — Z7289 Other problems related to lifestyle: Secondary | ICD-10-CM | POA: Diagnosis not present

## 2019-11-02 DIAGNOSIS — Z83438 Family history of other disorder of lipoprotein metabolism and other lipidemia: Secondary | ICD-10-CM | POA: Diagnosis not present

## 2019-11-02 DIAGNOSIS — K5790 Diverticulosis of intestine, part unspecified, without perforation or abscess without bleeding: Secondary | ICD-10-CM | POA: Diagnosis not present

## 2019-11-02 DIAGNOSIS — Z79899 Other long term (current) drug therapy: Secondary | ICD-10-CM | POA: Diagnosis not present

## 2019-11-02 DIAGNOSIS — Z20822 Contact with and (suspected) exposure to covid-19: Secondary | ICD-10-CM | POA: Diagnosis present

## 2019-11-02 DIAGNOSIS — E119 Type 2 diabetes mellitus without complications: Secondary | ICD-10-CM | POA: Diagnosis present

## 2019-11-02 DIAGNOSIS — M4800 Spinal stenosis, site unspecified: Secondary | ICD-10-CM | POA: Diagnosis present

## 2019-11-02 DIAGNOSIS — Z7982 Long term (current) use of aspirin: Secondary | ICD-10-CM | POA: Diagnosis not present

## 2019-11-02 DIAGNOSIS — K922 Gastrointestinal hemorrhage, unspecified: Secondary | ICD-10-CM | POA: Diagnosis present

## 2019-11-02 DIAGNOSIS — I10 Essential (primary) hypertension: Secondary | ICD-10-CM | POA: Diagnosis present

## 2019-11-02 DIAGNOSIS — K625 Hemorrhage of anus and rectum: Secondary | ICD-10-CM | POA: Diagnosis not present

## 2019-11-02 DIAGNOSIS — Z8249 Family history of ischemic heart disease and other diseases of the circulatory system: Secondary | ICD-10-CM | POA: Diagnosis not present

## 2019-11-02 DIAGNOSIS — Z79890 Hormone replacement therapy (postmenopausal): Secondary | ICD-10-CM | POA: Diagnosis not present

## 2019-11-02 DIAGNOSIS — K5731 Diverticulosis of large intestine without perforation or abscess with bleeding: Secondary | ICD-10-CM | POA: Diagnosis present

## 2019-11-02 DIAGNOSIS — E669 Obesity, unspecified: Secondary | ICD-10-CM | POA: Diagnosis present

## 2019-11-02 DIAGNOSIS — E213 Hyperparathyroidism, unspecified: Secondary | ICD-10-CM | POA: Diagnosis present

## 2019-11-02 DIAGNOSIS — E785 Hyperlipidemia, unspecified: Secondary | ICD-10-CM | POA: Diagnosis present

## 2019-11-02 LAB — GLUCOSE, CAPILLARY
Glucose-Capillary: 106 mg/dL — ABNORMAL HIGH (ref 70–99)
Glucose-Capillary: 114 mg/dL — ABNORMAL HIGH (ref 70–99)
Glucose-Capillary: 120 mg/dL — ABNORMAL HIGH (ref 70–99)
Glucose-Capillary: 124 mg/dL — ABNORMAL HIGH (ref 70–99)
Glucose-Capillary: 154 mg/dL — ABNORMAL HIGH (ref 70–99)
Glucose-Capillary: 157 mg/dL — ABNORMAL HIGH (ref 70–99)

## 2019-11-02 LAB — COMPREHENSIVE METABOLIC PANEL
ALT: 22 U/L (ref 0–44)
AST: 20 U/L (ref 15–41)
Albumin: 3.6 g/dL (ref 3.5–5.0)
Alkaline Phosphatase: 42 U/L (ref 38–126)
Anion gap: 9 (ref 5–15)
BUN: 21 mg/dL (ref 8–23)
CO2: 25 mmol/L (ref 22–32)
Calcium: 8.3 mg/dL — ABNORMAL LOW (ref 8.9–10.3)
Chloride: 107 mmol/L (ref 98–111)
Creatinine, Ser: 0.99 mg/dL (ref 0.61–1.24)
GFR calc Af Amer: >60 mL/min (ref >60)
GFR calc non Af Amer: >60 mL/min (ref >60)
Glucose, Bld: 121 mg/dL — ABNORMAL HIGH (ref 70–99)
Potassium: 3.1 mmol/L — ABNORMAL LOW (ref 3.5–5.1)
Sodium: 141 mmol/L (ref 135–145)
Total Bilirubin: 2.1 mg/dL — ABNORMAL HIGH (ref 0.3–1.2)
Total Protein: 5.8 g/dL — ABNORMAL LOW (ref 6.5–8.1)

## 2019-11-02 LAB — PROTIME-INR
INR: 1.1 (ref 0.8–1.2)
Prothrombin Time: 13.3 seconds (ref 11.4–15.2)

## 2019-11-02 LAB — APTT: aPTT: 28 seconds (ref 24–36)

## 2019-11-02 LAB — CBC
HCT: 30.8 % — ABNORMAL LOW (ref 39.0–52.0)
Hemoglobin: 10.6 g/dL — ABNORMAL LOW (ref 13.0–17.0)
MCH: 31.2 pg (ref 26.0–34.0)
MCHC: 34.4 g/dL (ref 30.0–36.0)
MCV: 90.6 fL (ref 80.0–100.0)
Platelets: 161 10*3/uL (ref 150–400)
RBC: 3.4 MIL/uL — ABNORMAL LOW (ref 4.22–5.81)
RDW: 13.1 % (ref 11.5–15.5)
WBC: 7.1 10*3/uL (ref 4.0–10.5)
nRBC: 0 % (ref 0.0–0.2)

## 2019-11-02 MED ORDER — POTASSIUM CHLORIDE 10 MEQ/100ML IV SOLN
10.0000 meq | INTRAVENOUS | Status: AC
  Administered 2019-11-02 (×4): 10 meq via INTRAVENOUS
  Filled 2019-11-02 (×3): qty 100

## 2019-11-02 MED ORDER — MORPHINE SULFATE (PF) 2 MG/ML IV SOLN: 2.0000 mg | INTRAVENOUS | Status: DC | PRN

## 2019-11-02 NOTE — Assessment & Plan Note (Signed)
-   A1c 7% on 10/06/19 -continue SSI and CBG monitoring

## 2019-11-02 NOTE — Progress Notes (Signed)
PROGRESS NOTE    [name] [name]   OFB:[id number]  DOB: December 30, 1950  DOA: 11/01/2019     0  PCP: Denita Lung, MD  CC: rectal bleeding  Hospital Course: [name] [name] is a 69 yo CM with PMH diverticulosis, HTN, HLD who presented to the hospital with bright red blood per rectum. He has a history of diverticular bleeds and due to ongoing bloody bowel movements at home, he presented for further evaluation. He had a hemoglobin drop from baseline on admission and underwent bowel prep in case of need for colonoscopy after admission.  GI was consulted on admission.  After his bowel prep, he had no further bloody bowel movements.  He was recommended for further monitoring and observation per GI but to hold off on any colonoscopy and lieu of pursuing a bleeding scan and possible embolization if recurrent bleed.   Interval History:  Resting in bed this am with wife bedside.  He states he has had no further rectal bleeding since around 3 AM this morning.  Completed his bowel prep but colonoscopy was not pursued this morning.  He understands the plan is for ongoing monitoring of hemoglobin and if remains stable, probable discharge home tomorrow.  Old records reviewed in assessment of this patient  ROS: Constitutional: negative for chills and fevers, Respiratory: negative for cough, Cardiovascular: negative for chest pain and Gastrointestinal: negative for abdominal pain  Assessment & Plan: Hypertension -Continue current regimen.  If becomes hypotensive, will hold BP meds  Hyperlipidemia with target LDL less than 100 -Continue statin  New onset type 2 diabetes mellitus (HCC) - A1c 7% on 10/06/19 -continue SSI and CBG monitoring   Acute lower GI bleeding -Patient presented with bright red blood per rectum with a history of previous diverticular bleeds.  He was started on a bowel prep after admission in case of need for colonoscopy.  GI was consulted on admission as well, appreciate  assistance -Continue trending hemoglobin, bleeding has stopped however hemoglobin has been still downtrending; hopeful for this just being a lag -Follow-up repeat hemoglobin in a.m. -Clear liquid diet has been initiated, okay to hold off on further fluids for now -If recurrent bleeding, per GI will pursue further imaging and/or possible embolization if needed   Antimicrobials: None  DVT prophylaxis: SCD Code Status: Full Family Communication: Bedside Disposition Plan:  Status is: Inpatient  Remains inpatient appropriate because:Ongoing diagnostic testing needed not appropriate for outpatient work up, IV treatments appropriate due to intensity of illness or inability to take PO and Inpatient level of care appropriate due to severity of illness   Dispo: The patient is from: Home              Anticipated d/c is to: Home              Anticipated d/c date is: 1 day              Patient currently is not medically stable to d/c.       Objective: Blood pressure (!) 141/78, pulse 67, temperature 98.5 F (36.9 C), temperature source Oral, resp. rate 20, height 5\' 11"  (1.803 m), weight 106.6 kg, SpO2 96 %.  Examination: General appearance: alert, cooperative and no distress Head: Normocephalic, without obvious abnormality, atraumatic Eyes: EOMI Lungs: clear to auscultation bilaterally Heart: regular rate and rhythm and S1, S2 normal Abdomen: normal findings: bowel sounds normal and soft, non-tender Extremities: No edema Skin: mobility and turgor normal Neurologic: Grossly normal  Consultants:  GI  Procedures:   None  Data Reviewed: I have personally reviewed following labs and imaging studies Results for orders placed or performed during the hospital encounter of 11/01/19 (from the past 24 hour(s))  Comprehensive metabolic panel     Status: Abnormal   Collection Time: 11/01/19  4:24 PM  Result Value Ref Range   Sodium 138 135 - 145 mmol/L   Potassium 3.0 (L) 3.5 - 5.1  mmol/L   Chloride 101 98 - 111 mmol/L   CO2 28 22 - 32 mmol/L   Glucose, Bld 193 (H) 70 - 99 mg/dL   BUN 26 (H) 8 - 23 mg/dL   Creatinine, Ser 1.11 0.61 - 1.24 mg/dL   Calcium 8.9 8.9 - 10.3 mg/dL   Total Protein 6.6 6.5 - 8.1 g/dL   Albumin 3.9 3.5 - 5.0 g/dL   AST 20 15 - 41 U/L   ALT 23 0 - 44 U/L   Alkaline Phosphatase 49 38 - 126 U/L   Total Bilirubin 2.2 (H) 0.3 - 1.2 mg/dL   GFR calc non Af Amer >60 >60 mL/min   GFR calc Af Amer >60 >60 mL/min   Anion gap 9 5 - 15  CBC     Status: Abnormal   Collection Time: 11/01/19  4:24 PM  Result Value Ref Range   WBC 11.9 (H) 4.0 - 10.5 K/uL   RBC 4.24 4.22 - 5.81 MIL/uL   Hemoglobin 12.7 (L) 13.0 - 17.0 g/dL   HCT 38.2 (L) 39 - 52 %   MCV 90.1 80.0 - 100.0 fL   MCH 30.0 26.0 - 34.0 pg   MCHC 33.2 30.0 - 36.0 g/dL   RDW 13.0 11.5 - 15.5 %   Platelets 207 150 - 400 K/uL   nRBC 0.0 0.0 - 0.2 %  Type and screen Okay     Status: None   Collection Time: 11/01/19  4:25 PM  Result Value Ref Range   ABO/RH(D) AB POS    Antibody Screen NEG    Sample Expiration      11/04/2019,2359 Performed at [facility], [place] [address] [place], [place] [address]   SARS Coronavirus 2 by RT PCR (hospital order, performed in [facility] hospital lab) Nasopharyngeal Nasopharyngeal Swab     Status: None   Collection Time: 11/01/19  5:54 PM   Specimen: Nasopharyngeal Swab  Result Value Ref Range   SARS Coronavirus 2 NEGATIVE NEGATIVE  Glucose, capillary     Status: Abnormal   Collection Time: 11/01/19  8:45 PM  Result Value Ref Range   Glucose-Capillary 163 (H) 70 - 99 mg/dL   Comment 1 Notify RN    Comment 2 Document in Chart   CBC with Differential     Status: Abnormal   Collection Time: 11/01/19  9:07 PM  Result Value Ref Range   WBC 10.5 4.0 - 10.5 K/uL   RBC 3.80 (L) 4.22 - 5.81 MIL/uL   Hemoglobin 11.6 (L) 13.0 - 17.0 g/dL   HCT 33.9 (L) 39 - 52 %   MCV 89.2 80.0 - 100.0 fL   MCH 30.5 26.0 -  34.0 pg   MCHC 34.2 30.0 - 36.0 g/dL   RDW 12.9 11.5 - 15.5 %   Platelets 186 150 - 400 K/uL   nRBC 0.0 0.0 - 0.2 %   Neutrophils Relative % 85 %   Neutro Abs 8.9 (H) 1.7 - 7.7 K/uL   Lymphocytes Relative 10 %  Lymphs Abs 1.0 0.7 - 4.0 K/uL   Monocytes Relative 5 %   Monocytes Absolute 0.5 0 - 1 K/uL   Eosinophils Relative 0 %   Eosinophils Absolute 0.0 0 - 0 K/uL   Basophils Relative 0 %   Basophils Absolute 0.0 0 - 0 K/uL   Immature Granulocytes 0 %   Abs Immature Granulocytes 0.03 0.00 - 0.07 K/uL  Glucose, capillary     Status: Abnormal   Collection Time: 11/02/19 12:28 AM  Result Value Ref Range   Glucose-Capillary 154 (H) 70 - 99 mg/dL   Comment 1 Notify RN    Comment 2 Document in Chart   Glucose, capillary     Status: Abnormal   Collection Time: 11/02/19  4:59 AM  Result Value Ref Range   Glucose-Capillary 106 (H) 70 - 99 mg/dL  Comprehensive metabolic panel     Status: Abnormal   Collection Time: 11/02/19  7:47 AM  Result Value Ref Range   Sodium 141 135 - 145 mmol/L   Potassium 3.1 (L) 3.5 - 5.1 mmol/L   Chloride 107 98 - 111 mmol/L   CO2 25 22 - 32 mmol/L   Glucose, Bld 121 (H) 70 - 99 mg/dL   BUN 21 8 - 23 mg/dL   Creatinine, Ser 0.99 0.61 - 1.24 mg/dL   Calcium 8.3 (L) 8.9 - 10.3 mg/dL   Total Protein 5.8 (L) 6.5 - 8.1 g/dL   Albumin 3.6 3.5 - 5.0 g/dL   AST 20 15 - 41 U/L   ALT 22 0 - 44 U/L   Alkaline Phosphatase 42 38 - 126 U/L   Total Bilirubin 2.1 (H) 0.3 - 1.2 mg/dL   GFR calc non Af Amer >60 >60 mL/min   GFR calc Af Amer >60 >60 mL/min   Anion gap 9 5 - 15  Protime-INR     Status: None   Collection Time: 11/02/19  7:47 AM  Result Value Ref Range   Prothrombin Time 13.3 11.4 - 15.2 seconds   INR 1.1 0.8 - 1.2  APTT     Status: None   Collection Time: 11/02/19  7:47 AM  Result Value Ref Range   aPTT 28 24 - 36 seconds  CBC     Status: Abnormal   Collection Time: 11/02/19  7:47 AM  Result Value Ref Range   WBC 7.1 4.0 - 10.5 K/uL   RBC  3.40 (L) 4.22 - 5.81 MIL/uL   Hemoglobin 10.6 (L) 13.0 - 17.0 g/dL   HCT 30.8 (L) 39 - 52 %   MCV 90.6 80.0 - 100.0 fL   MCH 31.2 26.0 - 34.0 pg   MCHC 34.4 30.0 - 36.0 g/dL   RDW 13.1 11.5 - 15.5 %   Platelets 161 150 - 400 K/uL   nRBC 0.0 0.0 - 0.2 %  Glucose, capillary     Status: Abnormal   Collection Time: 11/02/19  7:51 AM  Result Value Ref Range   Glucose-Capillary 124 (H) 70 - 99 mg/dL   Comment 1 Notify RN    Comment 2 Document in Chart   Glucose, capillary     Status: Abnormal   Collection Time: 11/02/19 12:29 PM  Result Value Ref Range   Glucose-Capillary 120 (H) 70 - 99 mg/dL   Comment 1 Notify RN    Comment 2 Document in Chart     Recent Results (from the past 240 hour(s))  SARS Coronavirus 2 by RT PCR (hospital order, performed in [facility] hospital lab) Nasopharyngeal Nasopharyngeal Swab     Status: None   Collection Time: 11/01/19  5:54 PM   Specimen: Nasopharyngeal Swab  Result Value Ref Range Status   SARS Coronavirus 2 NEGATIVE NEGATIVE Final    Comment: (NOTE) SARS-CoV-2 target nucleic acids are NOT DETECTED.  The SARS-CoV-2 RNA is generally detectable in upper and lower respiratory specimens during the acute phase of infection. The lowest concentration of SARS-CoV-2 viral copies this assay can detect is 250 copies / mL. A negative result does not preclude SARS-CoV-2 infection and should not be used as the sole basis for treatment or other patient management decisions.  A negative result may occur with improper specimen collection / handling, submission of specimen other than nasopharyngeal swab, presence of viral mutation(s) within the areas targeted by this assay, and inadequate number of viral copies (<250 copies / mL). A negative result must be combined with clinical observations, patient history, and epidemiological information.  Fact Sheet for Patients:   StrictlyIdeas.no  Fact Sheet for Healthcare  Providers: BankingDealers.co.za  This test is not yet approved or  cleared by the [place] FDA and has been authorized for detection and/or diagnosis of SARS-CoV-2 by FDA under an Emergency Use Authorization (EUA).  This EUA will remain in effect (meaning this test can be used) for the duration of the COVID-19 declaration under Section 564(b)(1) of the Act, 21 U.S.C. section 360bbb-3(b)(1), unless the authorization is terminated or revoked sooner.  Performed at [facility], [place] [address] [place], [place] [address]      Radiology Studies: CT ABDOMEN PELVIS W CONTRAST  Result Date: 11/01/2019 CLINICAL DATA:  Bright red blood per rectum, history of diverticulitis EXAM: CT ABDOMEN AND PELVIS WITH CONTRAST TECHNIQUE: Multidetector CT imaging of the abdomen and pelvis was performed using the standard protocol following bolus administration of intravenous contrast. CONTRAST:  110mL OMNIPAQUE IOHEXOL 300 MG/ML  SOLN COMPARISON:  None. FINDINGS: Lower chest: No acute pleural or parenchymal lung disease. Trace pericardial effusion. Hepatobiliary: No focal liver abnormality is seen. No gallstones, gallbladder wall thickening, or biliary dilatation. Pancreas: Unremarkable. No pancreatic ductal dilatation or surrounding inflammatory changes. Spleen: Normal in size without focal abnormality. Adrenals/Urinary Tract: 2.4 cm right adrenal myelolipoma. Left adrenal is unremarkable. Numerous bilateral renal hypodensities are identified compatible with multiple cysts. No urinary tract calculi or obstructive uropathy. Bladder is minimally distended with no focal abnormalities. Stomach/Bowel: There is diffuse diverticulosis of the colon with no evidence of acute diverticulitis. Normal retrocecal appendix. No bowel wall thickening or inflammatory change. Vascular/Lymphatic: Aortic atherosclerosis. No enlarged abdominal or pelvic lymph nodes. Reproductive: Prostate is  unremarkable. Other: No free fluid or free gas. Small fat containing umbilical hernia. No bowel herniation. Musculoskeletal: No acute or destructive bony lesions. Reconstructed images demonstrate no additional findings. IMPRESSION: 1. Diffuse colonic diverticulosis without diverticulitis. 2. Trace pericardial effusion. 3. Benign right adrenal myelolipoma. 4. Aortic Atherosclerosis (ICD10-I70.0). Electronically Signed   By: Randa Ngo M.D.   On: 11/01/2019 19:01   CT ABDOMEN PELVIS W CONTRAST  Final Result       Scheduled Meds: . amLODipine  10 mg Oral Daily  . irbesartan  300 mg Oral Daily   And  . hydrochlorothiazide  12.5 mg Oral Daily  . insulin aspart  0-6 Units Subcutaneous Q4H  . pantoprazole (PROTONIX) IV  40 mg Intravenous Q24H  . simvastatin  20 mg Oral QPM  . sodium chloride flush  3 mL Intravenous Q12H   PRN Meds: acetaminophen **  OR** acetaminophen, ondansetron **OR** ondansetron (ZOFRAN) IV, traZODone Continuous Infusions: . sodium chloride 100 mL/hr at 11/02/19 0739      LOS: 0 days  Time spent: Greater than 50% of the 35 minute visit was spent in counseling/coordination of care for the patient as laid out in the A&P.   Dwyane Dee, MD [facility] 11/02/2019, 3:51 PM   Contact via secure chat.  To contact the attending provider between 7A-7P or the covering provider during after hours 7P-7A, please log into the web site [email or web address] and access using universal [place] password for that web site. If you do not have the password, please call the hospital operator.

## 2019-11-02 NOTE — Assessment & Plan Note (Addendum)
-  Patient presented with bright red blood per rectum with a history of previous diverticular bleeds.  He was started on a bowel prep after admission in case of need for colonoscopy.  GI was consulted on admission as well, appreciate assistance -Continue trending hemoglobin, bleeding has stopped however hemoglobin has been still downtrending; hopeful for this just being a lag -Clear liquid diet tolerated which was advanced to soft diet prior to discharge with no complications -Patient was evaluated by GI during hospitalization as well; no colonoscopy recommended.  He was recommended to return if recurrent bleeding at home for nuc scan and if needed, embolization -Patient was recommended to have close outpatient repeat of hemoglobin and if he developed any worsening symptoms of dizziness/lethargy again or any further rectal bleeding, to return to the ER

## 2019-11-02 NOTE — Assessment & Plan Note (Addendum)
-  Continue current regimen.  If becomes hypotensive, will hold BP meds

## 2019-11-02 NOTE — Consult Note (Signed)
Subjective:   HPI  The patient is a 69 year old male who we are seeing in consultation today in regards to lower GI bleed.  The patient states that at 6 AM yesterday he noted the urge to have a bowel movement and it was bloody in nature.  He then had further bloody bowel movements and finally came to the emergency room after feeling lightheaded and dizzy and he was subsequently admitted to the hospital.  The patient has known diverticulosis of the colon and has had bleeding from these in the past.  He denies abdominal pain or vomiting.  He feels as though the bleeding slowed down last night around 6 PM but then he was given a prep for a colonoscopy to clean his colon out and noted some more bleeding about 1 in the morning but this morning it seems to just be a small amount around 7:00 and darker in color which he felt was older blood.  He feels fine at this time.  Review of Systems Denies chest pain or shortness of breath  Past Medical History:  Diagnosis Date  . Dyslipidemia   . ED (erectile dysfunction)   . Hyperlipidemia   . Hyperparathyroidism   . Hypertension   . Hypogonadism, male   . Sleep apnea    Past Surgical History:  Procedure Laterality Date  . PARATHYROIDECTOMY    . SKIN BIOPSY Left 03/02/2019   squamous cell carcinoma in situ, hypertrophic, traumatized   Social History   Socioeconomic History  . Marital status: Married    Spouse name: Not on file  . Number of children: Not on file  . Years of education: Not on file  . Highest education level: Not on file  Occupational History  . Not on file  Tobacco Use  . Smoking status: Never Smoker  . Smokeless tobacco: Never Used  Substance and Sexual Activity  . Alcohol use: Yes    Alcohol/week: 14.0 standard drinks    Types: 14 Cans of beer per week  . Drug use: No  . Sexual activity: Yes  Other Topics Concern  . Not on file  Social History Narrative  . Not on file   Social Determinants of Health   Financial  Resource Strain:   . Difficulty of Paying Living Expenses:   Food Insecurity:   . Worried About Charity fundraiser in the Last Year:   . Arboriculturist in the Last Year:   Transportation Needs:   . Film/video editor (Medical):   Marland Kitchen Lack of Transportation (Non-Medical):   Physical Activity:   . Days of Exercise per Week:   . Minutes of Exercise per Session:   Stress:   . Feeling of Stress :   Social Connections:   . Frequency of Communication with Friends and Family:   . Frequency of Social Gatherings with Friends and Family:   . Attends Religious Services:   . Active Member of Clubs or Organizations:   . Attends Archivist Meetings:   Marland Kitchen Marital Status:   Intimate Partner Violence:   . Fear of Current or Ex-Partner:   . Emotionally Abused:   Marland Kitchen Physically Abused:   . Sexually Abused:    family history includes Hyperlipidemia in his father; Hypertension in his father.  Current Facility-Administered Medications:  .  0.9 %  sodium chloride infusion, , Intravenous, Continuous, Clarnce Flock, MD, Last Rate: 100 mL/hr at 11/02/19 0739, New Bag at 11/02/19 0739 .  acetaminophen (TYLENOL) tablet  650 mg, 650 mg, Oral, Q6H PRN **OR** acetaminophen (TYLENOL) suppository 650 mg, 650 mg, Rectal, Q6H PRN, Clarnce Flock, MD .  amLODipine (NORVASC) tablet 10 mg, 10 mg, Oral, Daily, Clarnce Flock, MD .  irbesartan (AVAPRO) tablet 300 mg, 300 mg, Oral, Daily **AND** hydrochlorothiazide (MICROZIDE) capsule 12.5 mg, 12.5 mg, Oral, Daily, Clarnce Flock, MD .  HYDROmorphone (DILAUDID) injection 0.5-1 mg, 0.5-1 mg, Intravenous, Q2H PRN, Clarnce Flock, MD .  insulin aspart (novoLOG) injection 0-6 Units, 0-6 Units, Subcutaneous, Q4H, Clarnce Flock, MD, 1 Units at 11/02/19 0039 .  ondansetron (ZOFRAN) tablet 4 mg, 4 mg, Oral, Q6H PRN **OR** ondansetron (ZOFRAN) injection 4 mg, 4 mg, Intravenous, Q6H PRN, Clarnce Flock, MD .  pantoprazole (PROTONIX) injection  40 mg, 40 mg, Intravenous, Q24H, Clarnce Flock, MD, 40 mg at 11/02/19 0857 .  potassium chloride 10 mEq in 100 mL IVPB, 10 mEq, Intravenous, Q1 Hr x 4, Girguis, David, MD, Last Rate: 100 mL/hr at 11/02/19 0903, 10 mEq at 11/02/19 0903 .  simvastatin (ZOCOR) tablet 20 mg, 20 mg, Oral, QPM, Clarnce Flock, MD .  sodium chloride flush (NS) 0.9 % injection 3 mL, 3 mL, Intravenous, Q12H, Clarnce Flock, MD, 3 mL at 11/01/19 2059 .  traZODone (DESYREL) tablet 50 mg, 50 mg, Oral, QHS PRN, Clarnce Flock, MD Allergies  Allergen Reactions  . [place] Venom Anaphylaxis     Objective:     BP 138/76 (BP Location: Left Arm)   Pulse 65   Temp 98.1 F (36.7 C) (Oral)   Resp 16   Ht 5\' 11"  (1.803 m)   Wt 106.6 kg   SpO2 98%   BMI 32.78 kg/m   He is in no acute distress at this time  Nonicteric  Heart regular rhythm no murmurs  Lungs clear  Abdomen soft and nontender  Laboratory No components found for: D1    Assessment:     Lower GI bleed most likely secondary to diverticular disease of the colon      Plan:     For now I would recommend continued supportive care.  The vast majority of times diverticular bleeding stops on its own without intervention.  If he has further significant bleeding I would recommend doing a nuclear medicine GI bleeding scan or CT angiography and if positive for a site of active bleeding, send the patient for angiography with embolization of the bleeding site.  I do not think he needs an urgent colonoscopy.  In this situation the likelihood of finding an active bleeding site with colonoscopy is low. Lab Results  Component Value Date   HGB 10.6 (L) 11/02/2019   HGB 11.6 (L) 11/01/2019   HGB 12.7 (L) 11/01/2019   HGB 15.6 10/06/2019   HGB 18.1 (H) 09/30/2018   HGB 15.6 04/24/2017   HCT 30.8 (L) 11/02/2019   HCT 33.9 (L) 11/01/2019   HCT 38.2 (L) 11/01/2019   HCT 46.3 10/06/2019   HCT 51.5 (H) 09/30/2018   HCT 43.6 04/24/2017    ALKPHOS 42 11/02/2019   ALKPHOS 49 11/01/2019   ALKPHOS 73 10/06/2019   AST 20 11/02/2019   AST 20 11/01/2019   AST 24 10/06/2019   ALT 22 11/02/2019   ALT 23 11/01/2019   ALT 22 10/06/2019

## 2019-11-02 NOTE — Hospital Course (Addendum)
[name] [name] is a 69 yo CM with PMH diverticulosis, HTN, HLD who presented to the hospital with bright red blood per rectum. He has a history of diverticular bleeds and due to ongoing bloody bowel movements at home, he presented for further evaluation. He had a hemoglobin drop from baseline on admission and underwent bowel prep in case of need for colonoscopy after admission.  GI was consulted on admission.  After his bowel prep, he had no further bloody bowel movements.  He was recommended for further monitoring and observation per GI but to hold off on any colonoscopy and lieu of pursuing a bleeding scan and possible embolization if recurrent bleed.  With further monitoring, he had no further rectal bleeding.  He began having normal brown stools and no abdominal pain/cramping.  Vitals also remained stable.  His hemoglobin did show some further downtrend (9.7 g/dL on discharge) however this was considered a lag in plateauing from his large bleeding episode prior to hospitalization.  He was considered stable for discharging home with close outpatient follow-up for repeat blood work early in the coming week.  He voiced understanding to this and states he would schedule outpatient lab work for repeat hemoglobin check.

## 2019-11-02 NOTE — Assessment & Plan Note (Addendum)
Continue statin. 

## 2019-11-03 ENCOUNTER — Encounter: Payer: Self-pay | Admitting: Family Medicine

## 2019-11-03 DIAGNOSIS — K5790 Diverticulosis of intestine, part unspecified, without perforation or abscess without bleeding: Secondary | ICD-10-CM

## 2019-11-03 LAB — GLUCOSE, CAPILLARY
Glucose-Capillary: 106 mg/dL — ABNORMAL HIGH (ref 70–99)
Glucose-Capillary: 107 mg/dL — ABNORMAL HIGH (ref 70–99)
Glucose-Capillary: 145 mg/dL — ABNORMAL HIGH (ref 70–99)
Glucose-Capillary: 95 mg/dL (ref 70–99)

## 2019-11-03 LAB — CBC WITH DIFFERENTIAL/PLATELET
Abs Immature Granulocytes: 0.03 10*3/uL (ref 0.00–0.07)
Basophils Absolute: 0.1 10*3/uL (ref 0.0–0.1)
Basophils Relative: 1 %
Eosinophils Absolute: 0.2 10*3/uL (ref 0.0–0.5)
Eosinophils Relative: 5 %
HCT: 29.4 % — ABNORMAL LOW (ref 39.0–52.0)
Hemoglobin: 9.7 g/dL — ABNORMAL LOW (ref 13.0–17.0)
Immature Granulocytes: 1 %
Lymphocytes Relative: 24 %
Lymphs Abs: 1.1 10*3/uL (ref 0.7–4.0)
MCH: 30.2 pg (ref 26.0–34.0)
MCHC: 33 g/dL (ref 30.0–36.0)
MCV: 91.6 fL (ref 80.0–100.0)
Monocytes Absolute: 0.4 10*3/uL (ref 0.1–1.0)
Monocytes Relative: 9 %
Neutro Abs: 2.8 10*3/uL (ref 1.7–7.7)
Neutrophils Relative %: 60 %
Platelets: 148 10*3/uL — ABNORMAL LOW (ref 150–400)
RBC: 3.21 MIL/uL — ABNORMAL LOW (ref 4.22–5.81)
RDW: 13.1 % (ref 11.5–15.5)
WBC: 4.7 10*3/uL (ref 4.0–10.5)
nRBC: 0 % (ref 0.0–0.2)

## 2019-11-03 LAB — BASIC METABOLIC PANEL
Anion gap: 7 (ref 5–15)
BUN: 9 mg/dL (ref 8–23)
CO2: 26 mmol/L (ref 22–32)
Calcium: 8.2 mg/dL — ABNORMAL LOW (ref 8.9–10.3)
Chloride: 108 mmol/L (ref 98–111)
Creatinine, Ser: 0.76 mg/dL (ref 0.61–1.24)
GFR calc Af Amer: >60 mL/min (ref >60)
GFR calc non Af Amer: >60 mL/min (ref >60)
Glucose, Bld: 117 mg/dL — ABNORMAL HIGH (ref 70–99)
Potassium: 2.8 mmol/L — ABNORMAL LOW (ref 3.5–5.1)
Sodium: 141 mmol/L (ref 135–145)

## 2019-11-03 LAB — MAGNESIUM: Magnesium: 2 mg/dL (ref 1.7–2.4)

## 2019-11-03 MED ORDER — POTASSIUM CHLORIDE CRYS ER 20 MEQ PO TBCR
40.0000 meq | EXTENDED_RELEASE_TABLET | ORAL | Status: AC
  Administered 2019-11-03 (×2): 40 meq via ORAL
  Filled 2019-11-03 (×2): qty 2

## 2019-11-03 NOTE — Discharge Summary (Signed)
Physician Discharge Summary  [name] [name] GUY:[id number] DOB: Jun 21, 1950 DOA: 11/01/2019  PCP: Denita Lung, MD  Admit date: 11/01/2019 Discharge date: 11/03/2019  Admitted From: Home Disposition: Home Discharging physician: Dwyane Dee, MD  Recommendations for Outpatient Follow-up:  1. Repeat hemoglobin at follow-up, recommended early next week 2. Patient instructed to return if recurrent bleed or worsening anemia symptoms  Patient discharged to home in Discharge Condition: stable CODE STATUS: Full Diet recommendation:  Diet Orders (From admission, onward)    Start     Ordered   11/03/19 1016  DIET SOFT Room service appropriate? Yes; Fluid consistency: Thin  Diet effective now       Question Answer Comment  Room service appropriate? Yes   Fluid consistency: Thin      11/03/19 1015          Hospital Course: [name] is a 69 yo CM with PMH diverticulosis, HTN, HLD who presented to the hospital with bright red blood per rectum. He has a history of diverticular bleeds and due to ongoing bloody bowel movements at home, he presented for further evaluation. He had a hemoglobin drop from baseline on admission and underwent bowel prep in case of need for colonoscopy after admission.  GI was consulted on admission.  After his bowel prep, he had no further bloody bowel movements.  He was recommended for further monitoring and observation per GI but to hold off on any colonoscopy and lieu of pursuing a bleeding scan and possible embolization if recurrent bleed.  With further monitoring, he had no further rectal bleeding.  He began having normal brown stools and no abdominal pain/cramping.  Vitals also remained stable.  His hemoglobin did show some further downtrend (9.7 g/dL on discharge) however this was considered a lag in plateauing from his large bleeding episode prior to hospitalization.  He was considered stable for discharging home with close outpatient follow-up for repeat blood  work early in the coming week.  He voiced understanding to this and states he would schedule outpatient lab work for repeat hemoglobin check.   Hypertension -Continue current regimen.  If becomes hypotensive, will hold BP meds  Hyperlipidemia with target LDL less than 100 -Continue statin  New onset type 2 diabetes mellitus (HCC) - A1c 7% on 10/06/19 -continue SSI and CBG monitoring   Acute lower GI bleeding -Patient presented with bright red blood per rectum with a history of previous diverticular bleeds.  He was started on a bowel prep after admission in case of need for colonoscopy.  GI was consulted on admission as well, appreciate assistance -Continue trending hemoglobin, bleeding has stopped however hemoglobin has been still downtrending; hopeful for this just being a lag -Clear liquid diet tolerated which was advanced to soft diet prior to discharge with no complications -Patient was evaluated by GI during hospitalization as well; no colonoscopy recommended.  He was recommended to return if recurrent bleeding at home for nuc scan and if needed, embolization -Patient was recommended to have close outpatient repeat of hemoglobin and if he developed any worsening symptoms of dizziness/lethargy again or any further rectal bleeding, to return to the ER    The patient's chronic medical conditions were treated accordingly per the patient's home medication regimen except as noted.  On day of discharge, patient was felt deemed stable for discharge. Patient/family member advised to call PCP or come back to ER if needed.   Discharge Diagnoses:   Principal Diagnosis: Acute lower GI bleeding  Active Hospital Problems  Diagnosis Date Noted  . Diverticulosis 11/03/2019  . New onset type 2 diabetes mellitus ([place]) 10/06/2019  . Spinal stenosis 10/06/2019  . Hypertension 01/17/2011  . Hyperlipidemia with target LDL less than 100 01/17/2011  . Obesity (BMI 30-39.9) 01/17/2011    Resolved  Hospital Problems   Diagnosis Date Noted Date Resolved  . Acute lower GI bleeding 11/01/2019 11/03/2019    Priority: High  . GIB (gastrointestinal bleeding) 11/02/2019 11/03/2019    Discharge Instructions    Discharge instructions   Complete by: As directed    Wait to resume your aspirin until you have had a repeat Hemoglobin check and your bloodwork is stable.   Increase activity slowly   Complete by: As directed      Allergies as of 11/03/2019      Reactions   Yellow Jacket Venom Anaphylaxis      Medication List    TAKE these medications   amLODipine 10 MG tablet Commonly known as: NORVASC TAKE 1 TABLET BY MOUTH EVERY DAY What changed:   how much to take  how to take this  when to take this  additional instructions   aspirin 325 MG tablet Take 325 mg by mouth daily.   EPINEPHrine 0.3 mg/0.3 mL Devi Commonly known as: EPI-PEN Inject 0.3 mLs (0.3 mg total) into the muscle once.   fish oil-omega-3 fatty acids 1000 MG capsule Take 2 g by mouth daily.   Glucosamine 1500 Complex Caps Take 1 capsule by mouth daily.   multivitamin with minerals tablet Take 1 tablet by mouth daily.   sildenafil 20 MG tablet Commonly known as: REVATIO Take 1 to 5 pills as needed daily What changed:   how much to take  how to take this  when to take this  reasons to take this  additional instructions   simvastatin 20 MG tablet Commonly known as: ZOCOR Take 1 tablet (20 mg total) by mouth daily. What changed: when to take this   Testosterone 20.25 MG/ACT (1.62%) Gel Commonly known as: AndroGel Pump APPLY 2 PUMPS EVERY DAY TO SHOULDER AND ARM What changed:   how much to take  how to take this  when to take this  additional instructions   valsartan-hydrochlorothiazide 320-12.5 MG tablet Commonly known as: DIOVAN-HCT Take 1 tablet by mouth daily.       Allergies  Allergen Reactions  . Yellow Jacket Venom Anaphylaxis    Consultations: GI  Discharge  Exam: BP 137/78   Pulse 78   Temp 98 F (36.7 C) (Oral)   Resp 18   Ht 5\' 11"  (1.803 m)   Wt 106.6 kg   SpO2 95%   BMI 32.78 kg/m  General appearance: alert, cooperative and no distress Head: Normocephalic, without obvious abnormality, atraumatic Eyes: EOMI Lungs: clear to auscultation bilaterally Heart: regular rate and rhythm and S1, S2 normal Abdomen: normal findings: bowel sounds normal and soft, non-tender Extremities: No edema Skin: mobility and turgor normal Neurologic: Grossly normal  The results of significant diagnostics from this hospitalization (including imaging, microbiology, ancillary and laboratory) are listed below for reference.   Microbiology: Recent Results (from the past 240 hour(s))  SARS Coronavirus 2 by RT PCR (hospital order, performed in [facility] hospital lab) Nasopharyngeal Nasopharyngeal Swab     Status: None   Collection Time: 11/01/19  5:54 PM   Specimen: Nasopharyngeal Swab  Result Value Ref Range Status   SARS Coronavirus 2 NEGATIVE NEGATIVE Final    Comment: (NOTE) SARS-CoV-2 target nucleic acids are  NOT DETECTED.  The SARS-CoV-2 RNA is generally detectable in upper and lower respiratory specimens during the acute phase of infection. The lowest concentration of SARS-CoV-2 viral copies this assay can detect is 250 copies / mL. A negative result does not preclude SARS-CoV-2 infection and should not be used as the sole basis for treatment or other patient management decisions.  A negative result may occur with improper specimen collection / handling, submission of specimen other than nasopharyngeal swab, presence of viral mutation(s) within the areas targeted by this assay, and inadequate number of viral copies (<250 copies / mL). A negative result must be combined with clinical observations, patient history, and epidemiological information.  Fact Sheet for Patients:   StrictlyIdeas.no  Fact Sheet for Healthcare  Providers: BankingDealers.co.za  This test is not yet approved or  cleared by the [place] FDA and has been authorized for detection and/or diagnosis of SARS-CoV-2 by FDA under an Emergency Use Authorization (EUA).  This EUA will remain in effect (meaning this test can be used) for the duration of the COVID-19 declaration under Section 564(b)(1) of the Act, 21 U.S.C. section 360bbb-3(b)(1), unless the authorization is terminated or revoked sooner.  Performed at [facility], [place] [address], [place], [place] [address]      Labs: BNP (last 3 results) No results for input(s): BNP in the last 8760 hours. Basic Metabolic Panel: Recent Labs  Lab 11/01/19 1624 11/02/19 0747 11/03/19 0611  NA 138 141 141  K 3.0* 3.1* 2.8*  CL 101 107 108  CO2 28 25 26   GLUCOSE 193* 121* 117*  BUN 26* 21 9  CREATININE 1.11 0.99 0.76  CALCIUM 8.9 8.3* 8.2*  MG  --   --  2.0   Liver Function Tests: Recent Labs  Lab 11/01/19 1624 11/02/19 0747  AST 20 20  ALT 23 22  ALKPHOS 49 42  BILITOT 2.2* 2.1*  PROT 6.6 5.8*  ALBUMIN 3.9 3.6   No results for input(s): LIPASE, AMYLASE in the last 168 hours. No results for input(s): AMMONIA in the last 168 hours. CBC: Recent Labs  Lab 11/01/19 1624 11/01/19 2107 11/02/19 0747 11/03/19 0611  WBC 11.9* 10.5 7.1 4.7  NEUTROABS  --  8.9*  --  2.8  HGB 12.7* 11.6* 10.6* 9.7*  HCT 38.2* 33.9* 30.8* 29.4*  MCV 90.1 89.2 90.6 91.6  PLT 207 186 161 148*   Cardiac Enzymes: No results for input(s): CKTOTAL, CKMB, CKMBINDEX, TROPONINI in the last 168 hours. BNP: Invalid input(s): POCBNP CBG: Recent Labs  Lab 11/02/19 2036 11/03/19 0010 11/03/19 0435 11/03/19 0728 11/03/19 1203  GLUCAP 114* 95 107* 106* 145*   D-Dimer No results for input(s): DDIMER in the last 72 hours. Hgb A1c No results for input(s): HGBA1C in the last 72 hours. Lipid Profile No results for input(s): CHOL, HDL, LDLCALC, TRIG,  CHOLHDL, LDLDIRECT in the last 72 hours. Thyroid function studies No results for input(s): TSH, T4TOTAL, T3FREE, THYROIDAB in the last 72 hours.  Invalid input(s): FREET3 Anemia work up No results for input(s): VITAMINB12, FOLATE, FERRITIN, TIBC, IRON, RETICCTPCT in the last 72 hours. Urinalysis    Component Value Date/Time   BILIRUBINUR neg 03/08/2014 0934   PROTEINUR neg 03/08/2014 0934   UROBILINOGEN negative 03/08/2014 0934   NITRITE neg 03/08/2014 0934   LEUKOCYTESUR Negative 03/08/2014 0934   Sepsis Labs Invalid input(s): PROCALCITONIN,  WBC,  LACTICIDVEN Microbiology Recent Results (from the past 240 hour(s))  SARS Coronavirus 2 by RT PCR (hospital order,  performed in [facility] hospital lab) Nasopharyngeal Nasopharyngeal Swab     Status: None   Collection Time: 11/01/19  5:54 PM   Specimen: Nasopharyngeal Swab  Result Value Ref Range Status   SARS Coronavirus 2 NEGATIVE NEGATIVE Final    Comment: (NOTE) SARS-CoV-2 target nucleic acids are NOT DETECTED.  The SARS-CoV-2 RNA is generally detectable in upper and lower respiratory specimens during the acute phase of infection. The lowest concentration of SARS-CoV-2 viral copies this assay can detect is 250 copies / mL. A negative result does not preclude SARS-CoV-2 infection and should not be used as the sole basis for treatment or other patient management decisions.  A negative result may occur with improper specimen collection / handling, submission of specimen other than nasopharyngeal swab, presence of viral mutation(s) within the areas targeted by this assay, and inadequate number of viral copies (<250 copies / mL). A negative result must be combined with clinical observations, patient history, and epidemiological information.  Fact Sheet for Patients:   StrictlyIdeas.no  Fact Sheet for Healthcare Providers: BankingDealers.co.za  This test is not yet approved or   cleared by the [place] FDA and has been authorized for detection and/or diagnosis of SARS-CoV-2 by FDA under an Emergency Use Authorization (EUA).  This EUA will remain in effect (meaning this test can be used) for the duration of the COVID-19 declaration under Section 564(b)(1) of the Act, 21 U.S.C. section 360bbb-3(b)(1), unless the authorization is terminated or revoked sooner.  Performed at [facility], [place] [address], [place], [place] [address]     Procedures/Studies: CT ABDOMEN PELVIS W CONTRAST  Result Date: 11/01/2019 CLINICAL DATA:  Bright red blood per rectum, history of diverticulitis EXAM: CT ABDOMEN AND PELVIS WITH CONTRAST TECHNIQUE: Multidetector CT imaging of the abdomen and pelvis was performed using the standard protocol following bolus administration of intravenous contrast. CONTRAST:  14mL OMNIPAQUE IOHEXOL 300 MG/ML  SOLN COMPARISON:  None. FINDINGS: Lower chest: No acute pleural or parenchymal lung disease. Trace pericardial effusion. Hepatobiliary: No focal liver abnormality is seen. No gallstones, gallbladder wall thickening, or biliary dilatation. Pancreas: Unremarkable. No pancreatic ductal dilatation or surrounding inflammatory changes. Spleen: Normal in size without focal abnormality. Adrenals/Urinary Tract: 2.4 cm right adrenal myelolipoma. Left adrenal is unremarkable. Numerous bilateral renal hypodensities are identified compatible with multiple cysts. No urinary tract calculi or obstructive uropathy. Bladder is minimally distended with no focal abnormalities. Stomach/Bowel: There is diffuse diverticulosis of the colon with no evidence of acute diverticulitis. Normal retrocecal appendix. No bowel wall thickening or inflammatory change. Vascular/Lymphatic: Aortic atherosclerosis. No enlarged abdominal or pelvic lymph nodes. Reproductive: Prostate is unremarkable. Other: No free fluid or free gas. Small fat containing umbilical hernia. No bowel  herniation. Musculoskeletal: No acute or destructive bony lesions. Reconstructed images demonstrate no additional findings. IMPRESSION: 1. Diffuse colonic diverticulosis without diverticulitis. 2. Trace pericardial effusion. 3. Benign right adrenal myelolipoma. 4. Aortic Atherosclerosis (ICD10-I70.0). Electronically Signed   By: Randa Ngo M.D.   On: 11/01/2019 19:01     Time coordinating discharge: Over 30 minutes    Dwyane Dee, MD  [facility] 11/03/2019, 2:49 PM Pager: Secure chat  If 7PM-7AM, please contact night-coverage [email or web address] Password [email or web address]

## 2019-11-03 NOTE — Progress Notes (Signed)
The patient is doing well today.  No further reports of lower GI bleeding.  Hemoglobin 9.7.  Discussed with hospitalist and plan is to send patient home today since he is stable and doing well at this time.

## 2019-11-03 NOTE — Plan of Care (Signed)
  Problem: Health Behavior/Discharge Planning: Goal: Ability to manage health-related needs will improve Outcome: Progressing   Problem: Nutrition: Goal: Adequate nutrition will be maintained Outcome: Progressing   

## 2019-11-04 ENCOUNTER — Telehealth: Payer: Self-pay | Admitting: Family Medicine

## 2019-11-04 NOTE — Telephone Encounter (Signed)
Appt changed [facility]

## 2019-11-04 NOTE — Telephone Encounter (Signed)
Pt's wife called and made a lab appt for Monday per mychart message. Please put orders in system.

## 2019-11-04 NOTE — Telephone Encounter (Signed)
Make that an office visit make that an office visit

## 2019-11-07 ENCOUNTER — Ambulatory Visit (INDEPENDENT_AMBULATORY_CARE_PROVIDER_SITE_OTHER): Payer: Medicare Other | Admitting: Family Medicine

## 2019-11-07 ENCOUNTER — Other Ambulatory Visit: Payer: Medicare Other

## 2019-11-07 ENCOUNTER — Other Ambulatory Visit: Payer: Self-pay

## 2019-11-07 ENCOUNTER — Encounter: Payer: Self-pay | Admitting: Family Medicine

## 2019-11-07 VITALS — BP 128/76 | HR 76 | Temp 98.1°F | Wt 227.6 lb

## 2019-11-07 DIAGNOSIS — K5791 Diverticulosis of intestine, part unspecified, without perforation or abscess with bleeding: Secondary | ICD-10-CM

## 2019-11-07 LAB — CBC WITH DIFFERENTIAL/PLATELET
Basophils Absolute: 0 10*3/uL (ref 0.0–0.2)
Basos: 1 %
EOS (ABSOLUTE): 0.2 10*3/uL (ref 0.0–0.4)
Eos: 3 %
Hematocrit: 32.3 % — ABNORMAL LOW (ref 37.5–51.0)
Hemoglobin: 11.1 g/dL — ABNORMAL LOW (ref 13.0–17.7)
Immature Grans (Abs): 0 10*3/uL (ref 0.0–0.1)
Immature Granulocytes: 0 %
Lymphocytes Absolute: 1.3 10*3/uL (ref 0.7–3.1)
Lymphs: 18 %
MCH: 30.2 pg (ref 26.6–33.0)
MCHC: 34.4 g/dL (ref 31.5–35.7)
MCV: 88 fL (ref 79–97)
Monocytes Absolute: 0.6 10*3/uL (ref 0.1–0.9)
Monocytes: 8 %
Neutrophils Absolute: 5.1 10*3/uL (ref 1.4–7.0)
Neutrophils: 70 %
Platelets: 194 10*3/uL (ref 150–450)
RBC: 3.67 x10E6/uL — ABNORMAL LOW (ref 4.14–5.80)
RDW: 12.7 % (ref 11.6–15.4)
WBC: 7.2 10*3/uL (ref 3.4–10.8)

## 2019-11-07 NOTE — Progress Notes (Signed)
   Subjective:    Patient ID: [name] [name], male    DOB: May 19, 1950, 69 y.o.   MRN: [id number]  HPI He is here for a recheck.  He was recently hospitalized for evaluation of GI bleeding.  He did become anemic.  The diagnosis of a diverticular bleed was made.  His hemoglobin did drop to 9.5.  Since leaving the hospital he has had no more evidence of bleeding, weakness, fatigue.     Review of Systems     Objective:   Physical Exam        Assessment & Plan:  Gastrointestinal hemorrhage associated with intestinal diverticulosis - Plan: CBC with Differential/Platelet Discussed further intervention if he starts having difficulty with GI bleeding again.  He has a good handle on this as this is apparently his third time of having trouble.

## 2019-11-22 DIAGNOSIS — M9903 Segmental and somatic dysfunction of lumbar region: Secondary | ICD-10-CM | POA: Diagnosis not present

## 2019-11-22 DIAGNOSIS — M5442 Lumbago with sciatica, left side: Secondary | ICD-10-CM | POA: Diagnosis not present

## 2019-11-23 DIAGNOSIS — M545 Low back pain: Secondary | ICD-10-CM | POA: Diagnosis not present

## 2019-11-23 DIAGNOSIS — M5442 Lumbago with sciatica, left side: Secondary | ICD-10-CM | POA: Diagnosis not present

## 2019-11-23 DIAGNOSIS — M9903 Segmental and somatic dysfunction of lumbar region: Secondary | ICD-10-CM | POA: Diagnosis not present

## 2019-11-28 ENCOUNTER — Other Ambulatory Visit: Payer: Self-pay

## 2019-11-28 ENCOUNTER — Other Ambulatory Visit (INDEPENDENT_AMBULATORY_CARE_PROVIDER_SITE_OTHER): Payer: Medicare Other

## 2019-11-28 DIAGNOSIS — M9903 Segmental and somatic dysfunction of lumbar region: Secondary | ICD-10-CM | POA: Diagnosis not present

## 2019-11-28 DIAGNOSIS — M5442 Lumbago with sciatica, left side: Secondary | ICD-10-CM | POA: Diagnosis not present

## 2019-11-28 DIAGNOSIS — Z23 Encounter for immunization: Secondary | ICD-10-CM

## 2019-11-29 DIAGNOSIS — M5442 Lumbago with sciatica, left side: Secondary | ICD-10-CM | POA: Diagnosis not present

## 2019-11-29 DIAGNOSIS — M9903 Segmental and somatic dysfunction of lumbar region: Secondary | ICD-10-CM | POA: Diagnosis not present

## 2019-11-30 DIAGNOSIS — M9903 Segmental and somatic dysfunction of lumbar region: Secondary | ICD-10-CM | POA: Diagnosis not present

## 2019-11-30 DIAGNOSIS — M5442 Lumbago with sciatica, left side: Secondary | ICD-10-CM | POA: Diagnosis not present

## 2019-12-05 DIAGNOSIS — M9903 Segmental and somatic dysfunction of lumbar region: Secondary | ICD-10-CM | POA: Diagnosis not present

## 2019-12-05 DIAGNOSIS — M5442 Lumbago with sciatica, left side: Secondary | ICD-10-CM | POA: Diagnosis not present

## 2019-12-11 ENCOUNTER — Encounter: Payer: Self-pay | Admitting: Family Medicine

## 2019-12-12 DIAGNOSIS — M5442 Lumbago with sciatica, left side: Secondary | ICD-10-CM | POA: Diagnosis not present

## 2019-12-12 DIAGNOSIS — M9903 Segmental and somatic dysfunction of lumbar region: Secondary | ICD-10-CM | POA: Diagnosis not present

## 2019-12-14 ENCOUNTER — Other Ambulatory Visit: Payer: Self-pay | Admitting: Family Medicine

## 2019-12-14 DIAGNOSIS — M9903 Segmental and somatic dysfunction of lumbar region: Secondary | ICD-10-CM | POA: Diagnosis not present

## 2019-12-14 DIAGNOSIS — I10 Essential (primary) hypertension: Secondary | ICD-10-CM

## 2019-12-14 DIAGNOSIS — M5442 Lumbago with sciatica, left side: Secondary | ICD-10-CM | POA: Diagnosis not present

## 2019-12-14 DIAGNOSIS — E785 Hyperlipidemia, unspecified: Secondary | ICD-10-CM

## 2019-12-19 DIAGNOSIS — M5442 Lumbago with sciatica, left side: Secondary | ICD-10-CM | POA: Diagnosis not present

## 2019-12-19 DIAGNOSIS — M9903 Segmental and somatic dysfunction of lumbar region: Secondary | ICD-10-CM | POA: Diagnosis not present

## 2019-12-21 DIAGNOSIS — M5442 Lumbago with sciatica, left side: Secondary | ICD-10-CM | POA: Diagnosis not present

## 2019-12-21 DIAGNOSIS — M9903 Segmental and somatic dysfunction of lumbar region: Secondary | ICD-10-CM | POA: Diagnosis not present

## 2019-12-26 DIAGNOSIS — M9903 Segmental and somatic dysfunction of lumbar region: Secondary | ICD-10-CM | POA: Diagnosis not present

## 2019-12-26 DIAGNOSIS — M5442 Lumbago with sciatica, left side: Secondary | ICD-10-CM | POA: Diagnosis not present

## 2019-12-28 DIAGNOSIS — M5442 Lumbago with sciatica, left side: Secondary | ICD-10-CM | POA: Diagnosis not present

## 2019-12-28 DIAGNOSIS — M9903 Segmental and somatic dysfunction of lumbar region: Secondary | ICD-10-CM | POA: Diagnosis not present

## 2019-12-28 DIAGNOSIS — L570 Actinic keratosis: Secondary | ICD-10-CM | POA: Diagnosis not present

## 2019-12-28 DIAGNOSIS — L819 Disorder of pigmentation, unspecified: Secondary | ICD-10-CM | POA: Diagnosis not present

## 2020-01-31 DIAGNOSIS — Z23 Encounter for immunization: Secondary | ICD-10-CM | POA: Diagnosis not present

## 2020-02-14 ENCOUNTER — Ambulatory Visit (INDEPENDENT_AMBULATORY_CARE_PROVIDER_SITE_OTHER): Payer: Medicare Other | Admitting: Family Medicine

## 2020-02-14 ENCOUNTER — Encounter: Payer: Self-pay | Admitting: Family Medicine

## 2020-02-14 ENCOUNTER — Other Ambulatory Visit: Payer: Self-pay

## 2020-02-14 VITALS — BP 152/80 | HR 72 | Temp 97.4°F | Ht 69.5 in | Wt 233.8 lb

## 2020-02-14 DIAGNOSIS — M4800 Spinal stenosis, site unspecified: Secondary | ICD-10-CM | POA: Diagnosis not present

## 2020-02-14 DIAGNOSIS — E291 Testicular hypofunction: Secondary | ICD-10-CM

## 2020-02-14 DIAGNOSIS — E669 Obesity, unspecified: Secondary | ICD-10-CM | POA: Diagnosis not present

## 2020-02-14 DIAGNOSIS — N529 Male erectile dysfunction, unspecified: Secondary | ICD-10-CM | POA: Diagnosis not present

## 2020-02-14 DIAGNOSIS — I10 Essential (primary) hypertension: Secondary | ICD-10-CM

## 2020-02-14 DIAGNOSIS — Z0000 Encounter for general adult medical examination without abnormal findings: Secondary | ICD-10-CM

## 2020-02-14 DIAGNOSIS — K5790 Diverticulosis of intestine, part unspecified, without perforation or abscess without bleeding: Secondary | ICD-10-CM | POA: Diagnosis not present

## 2020-02-14 DIAGNOSIS — Z8719 Personal history of other diseases of the digestive system: Secondary | ICD-10-CM | POA: Diagnosis not present

## 2020-02-14 DIAGNOSIS — E119 Type 2 diabetes mellitus without complications: Secondary | ICD-10-CM | POA: Diagnosis not present

## 2020-02-14 DIAGNOSIS — E785 Hyperlipidemia, unspecified: Secondary | ICD-10-CM

## 2020-02-14 DIAGNOSIS — Z125 Encounter for screening for malignant neoplasm of prostate: Secondary | ICD-10-CM

## 2020-02-14 DIAGNOSIS — G4730 Sleep apnea, unspecified: Secondary | ICD-10-CM | POA: Diagnosis not present

## 2020-02-14 LAB — POCT GLYCOSYLATED HEMOGLOBIN (HGB A1C): Hemoglobin A1C: 6.7 % — AB (ref 4.0–5.6)

## 2020-02-14 NOTE — Progress Notes (Addendum)
[name] [name] is a 69 y.o. male who presents for annual wellness visit,CPE and follow-up on chronic medical conditions.  He has had a recent hospitalization for evaluation and treatment of acute blood loss which was secondary to diverticulosis.  He did follow-up with gastroenterology and that this point no further intervention is needed.  He has had a previous colonoscopy.  He has a history of low back pain with HNP and subsequent epidural injection, physical therapy and chiropractic care and now he states that he is doing quite nicely.  Apparently the MRI did show some questionable renal lesions.  He continues on amlodipine as well as valsartan/HCTZ.  Having no difficulty with that.  Simvastatin is causing no aches or pains.  He uses sildenafil on an as-needed basis.  He is taking testosterone and having no difficulty with that.  He has OSA and is using a CPAP.  He has no concerns or complaints concerning.   Immunizations and Health Maintenance Immunization History  Administered Date(s) Administered  . Fluad Quad(high Dose 65+) 11/28/2019  . Hepatitis A 04/28/2007, 05/31/2007  . Hepatitis B 04/28/2007, 05/31/2007, 01/08/2010  . Influenza Split 11/16/2011, 12/14/2012  . Influenza Whole 12/19/2008  . Influenza, High Dose Seasonal PF 11/12/2015, 12/30/2016, 12/29/2017, 11/18/2018  . Influenza,inj,Quad PF,6+ Mos 12/29/2013, 11/07/2014  . Influenza,inj,quad, With Preservative 12/11/2016  . Moderna SARS-COVID-2 Vaccination 04/19/2019, 05/19/2019, 01/31/2020  . Pneumococcal Conjugate-13 11/12/2015  . Pneumococcal Polysaccharide-23 01/17/2011  . Tdap 04/28/2007, 10/05/2017  . Zoster 01/17/2011  . Zoster Recombinat (Shingrix) 07/11/2016, 09/29/2016   Health Maintenance Due  Topic Date Due  . FOOT EXAM  Never done  . OPHTHALMOLOGY EXAM  Never done    Last colonoscopy: 03/14/15 Last PSA:10/06/19 Dentist: Q four months Ophtho: Q year Exercise: walking and weights 30 min a day three days a week He  keeps busy in the yard and other days. Other doctors caring for patient include: Dr. Oletta Lamas GI, Dr. Pearline Cables derma  Advanced Directives: Does Patient Have a Medical Advance Directive?: Yes Type of Advance Directive: Living will Does patient want to make changes to medical advance directive?: No - Patient declined  Depression screen:  See questionnaire below.     Depression screen [facility] 2/9 02/14/2020 10/06/2019 09/30/2018 01/14/2018 09/03/2017  Decreased Interest 0 0 0 0 0  Down, Depressed, Hopeless 0 0 0 0 0  PHQ - 2 Score 0 0 0 0 0    Fall Screen: See Questionaire below.   Fall Risk  02/14/2020 10/06/2019 09/30/2018 01/14/2018 10/14/2017  Falls in the past year? 0 0 0 No No  Comment - - - - Emmi Telephone Survey: data to providers prior to load    ADL screen:  See questionnaire below.  Functional Status Survey: Is the patient deaf or have difficulty hearing?: No Does the patient have difficulty seeing, even when wearing glasses/contacts?: No Does the patient have difficulty concentrating, remembering, or making decisions?: No Does the patient have difficulty walking or climbing stairs?: No Does the patient have difficulty dressing or bathing?: No Does the patient have difficulty doing errands alone such as visiting a doctor's office or shopping?: No   Review of Systems  Constitutional: -, -unexpected weight change, -anorexia, -fatigue Allergy: -sneezing, -itching, -congestion Dermatology: denies changing moles, rash, lumps ENT: -runny nose, -ear pain, -sore throat,  Cardiology:  -chest pain, -palpitations, -orthopnea, Respiratory: -cough, -shortness of breath, -dyspnea on exertion, -wheezing,  Gastroenterology: -abdominal pain, -nausea, -vomiting, -diarrhea, -constipation, -dysphagia Hematology: -bleeding or bruising problems Musculoskeletal: -arthralgias, -myalgias, -joint  swelling, -back pain, - Ophthalmology: -vision changes,  Urology: -dysuria, -difficulty urinating,  -urinary  frequency, -urgency, incontinence Neurology: -, -numbness, , -memory loss, -falls, -dizziness    PHYSICAL EXAM:   General Appearance: Alert, cooperative, no distress, appears stated age Head: Normocephalic, without obvious abnormality, atraumatic Eyes: PERRL, conjunctiva/corneas clear, EOM's intact,  Ears: Normal TM's and external ear canals Nose: Nares normal, mucosa normal, no drainage or sinus   tenderness Throat: Lips, mucosa, and tongue normal; teeth and gums normal Neck: Supple, no lymphadenopathy, thyroid:no enlargement/tenderness/nodules; no carotid bruit or JVD Lungs: Clear to auscultation bilaterally without wheezes, rales or ronchi; respirations unlabored Heart: Regular rate and rhythm, S1 and S2 normal, no murmur, rub or gallop Abdomen: Soft, non-tender, nondistended, normoactive bowel sounds, no masses, no hepatosplenomegaly Extremities: No clubbing, cyanosis or edema Pulses: 2+ and symmetric all extremities Skin: Skin color, texture, turgor normal, no rashes or lesions Lymph nodes: Cervical, supraclavicular, and axillary nodes normal Neurologic: CNII-XII intact, normal strength, sensation and gait; reflexes 2+ and symmetric throughout   Psych: Normal mood, affect, hygiene and grooming Hemoglobin A1c is 6.7 ASSESSMENT/PLAN: Routine general medical examination at a health care facility - Plan: Lipid panel, CBC with Differential/Platelet, POCT glycosylated hemoglobin (Hb A1C)  Primary hypertension - Plan: CBC with Differential/Platelet  Hyperlipidemia with target LDL less than 100 - Plan: Lipid panel  Hypogonadism male - Plan: Testosterone  Sleep apnea, unspecified type  Erectile dysfunction, unspecified erectile dysfunction type  Obesity (BMI 30-39.9) - Plan: Lipid panel, CBC with Differential/Platelet  New onset type 2 diabetes mellitus ([place]) - Plan: Lipid panel, CBC with Differential/Platelet  Spinal stenosis, unspecified spinal  region  Diverticulosis  Screening for prostate cancer - Plan: PSA  History of GI diverticular bleed Encouraged him to continue to stay physically active with his rehab.  He will continue on his present medication regimen.  Reinforced the need for him to continue to do a good job with diet and exercise.  He is to bring in the MRI report so I can review it is to find out any follow-up concerning the renal issues.  No therapy for the diverticulosis other than if he has a GI bleed again, he knows to come in quickly. He will continue on his CPAP. Discussed PSA screening (risks/benefits), recommended at least 30 minutes of aerobic activity at least 7days/week; ; healthy diet and alcohol recommendations (less than or equal to 2 drinks/day) reviewed;  Immunization recommendations discussed.  Colonoscopy recommendations reviewed. Recheck here in 4 months.  Medicare Attestation I have personally reviewed: The patient's medical and social history Their use of alcohol, tobacco or illicit drugs Their current medications and supplements The patient's functional ability including ADLs,fall risks, home safety risks, cognitive, and hearing and visual impairment Diet and physical activities Evidence for depression or mood disorders  The patient's weight, height, and BMI have been recorded in the chart.  I have made referrals, counseling, and provided education to the patient based on review of the above and I have provided the patient with a written personalized care plan for preventive services.     Jill Alexanders, MD   02/14/2020

## 2020-02-15 LAB — CBC WITH DIFFERENTIAL/PLATELET
Basophils Absolute: 0.1 10*3/uL (ref 0.0–0.2)
Basos: 1 %
EOS (ABSOLUTE): 0.2 10*3/uL (ref 0.0–0.4)
Eos: 3 %
Hematocrit: 47.7 % (ref 37.5–51.0)
Hemoglobin: 16.6 g/dL (ref 13.0–17.7)
Immature Grans (Abs): 0 10*3/uL (ref 0.0–0.1)
Immature Granulocytes: 0 %
Lymphocytes Absolute: 1.2 10*3/uL (ref 0.7–3.1)
Lymphs: 17 %
MCH: 28.9 pg (ref 26.6–33.0)
MCHC: 34.8 g/dL (ref 31.5–35.7)
MCV: 83 fL (ref 79–97)
Monocytes Absolute: 0.7 10*3/uL (ref 0.1–0.9)
Monocytes: 9 %
Neutrophils Absolute: 5 10*3/uL (ref 1.4–7.0)
Neutrophils: 70 %
Platelets: 184 10*3/uL (ref 150–450)
RBC: 5.74 x10E6/uL (ref 4.14–5.80)
RDW: 12.9 % (ref 11.6–15.4)
WBC: 7.1 10*3/uL (ref 3.4–10.8)

## 2020-02-15 LAB — LIPID PANEL
Chol/HDL Ratio: 4.3 ratio (ref 0.0–5.0)
Cholesterol, Total: 211 mg/dL — ABNORMAL HIGH (ref 100–199)
HDL: 49 mg/dL (ref >39)
LDL Chol Calc (NIH): 140 mg/dL — ABNORMAL HIGH (ref 0–99)
Triglycerides: 125 mg/dL (ref 0–149)
VLDL Cholesterol Cal: 22 mg/dL (ref 5–40)

## 2020-02-15 LAB — PSA: Prostate Specific Ag, Serum: 1.8 ng/mL (ref 0.0–4.0)

## 2020-02-15 LAB — TESTOSTERONE: Testosterone: 268 ng/dL (ref 264–916)

## 2020-02-15 MED ORDER — TESTOSTERONE 20.25 MG/ACT (1.62%) TD GEL: TRANSDERMAL | 5 refills | Status: DC

## 2020-02-15 MED ORDER — SIMVASTATIN 40 MG PO TABS: 40.0000 mg | ORAL_TABLET | Freq: Every day | ORAL | 3 refills | Status: DC

## 2020-02-15 NOTE — Addendum Note (Signed)
Addended by: Denita Lung on: 02/15/2020 08:27 AM   Modules accepted: Orders

## 2020-04-10 DIAGNOSIS — L570 Actinic keratosis: Secondary | ICD-10-CM | POA: Diagnosis not present

## 2020-05-07 DIAGNOSIS — M9903 Segmental and somatic dysfunction of lumbar region: Secondary | ICD-10-CM | POA: Diagnosis not present

## 2020-05-07 DIAGNOSIS — L570 Actinic keratosis: Secondary | ICD-10-CM | POA: Diagnosis not present

## 2020-05-07 DIAGNOSIS — Z85828 Personal history of other malignant neoplasm of skin: Secondary | ICD-10-CM | POA: Diagnosis not present

## 2020-05-07 DIAGNOSIS — L905 Scar conditions and fibrosis of skin: Secondary | ICD-10-CM | POA: Diagnosis not present

## 2020-05-07 DIAGNOSIS — L821 Other seborrheic keratosis: Secondary | ICD-10-CM | POA: Diagnosis not present

## 2020-05-07 DIAGNOSIS — M5442 Lumbago with sciatica, left side: Secondary | ICD-10-CM | POA: Diagnosis not present

## 2020-05-08 ENCOUNTER — Encounter: Payer: Self-pay | Admitting: Family Medicine

## 2020-05-08 DIAGNOSIS — M5442 Lumbago with sciatica, left side: Secondary | ICD-10-CM | POA: Diagnosis not present

## 2020-05-08 DIAGNOSIS — M9903 Segmental and somatic dysfunction of lumbar region: Secondary | ICD-10-CM | POA: Diagnosis not present

## 2020-05-08 DIAGNOSIS — M5459 Other low back pain: Secondary | ICD-10-CM | POA: Diagnosis not present

## 2020-05-09 DIAGNOSIS — M9903 Segmental and somatic dysfunction of lumbar region: Secondary | ICD-10-CM | POA: Diagnosis not present

## 2020-05-09 DIAGNOSIS — M5442 Lumbago with sciatica, left side: Secondary | ICD-10-CM | POA: Diagnosis not present

## 2020-05-09 NOTE — Telephone Encounter (Signed)
Get a copy of the MRI from the orthopedic group so I can look at it.

## 2020-05-10 DIAGNOSIS — M9903 Segmental and somatic dysfunction of lumbar region: Secondary | ICD-10-CM | POA: Diagnosis not present

## 2020-05-10 DIAGNOSIS — M5442 Lumbago with sciatica, left side: Secondary | ICD-10-CM | POA: Diagnosis not present

## 2020-05-12 DIAGNOSIS — M5442 Lumbago with sciatica, left side: Secondary | ICD-10-CM | POA: Diagnosis not present

## 2020-05-12 DIAGNOSIS — M9903 Segmental and somatic dysfunction of lumbar region: Secondary | ICD-10-CM | POA: Diagnosis not present

## 2020-05-14 DIAGNOSIS — M5442 Lumbago with sciatica, left side: Secondary | ICD-10-CM | POA: Diagnosis not present

## 2020-05-14 DIAGNOSIS — M9903 Segmental and somatic dysfunction of lumbar region: Secondary | ICD-10-CM | POA: Diagnosis not present

## 2020-05-15 DIAGNOSIS — M5442 Lumbago with sciatica, left side: Secondary | ICD-10-CM | POA: Diagnosis not present

## 2020-05-15 DIAGNOSIS — M9903 Segmental and somatic dysfunction of lumbar region: Secondary | ICD-10-CM | POA: Diagnosis not present

## 2020-05-16 DIAGNOSIS — M9903 Segmental and somatic dysfunction of lumbar region: Secondary | ICD-10-CM | POA: Diagnosis not present

## 2020-05-16 DIAGNOSIS — M5442 Lumbago with sciatica, left side: Secondary | ICD-10-CM | POA: Diagnosis not present

## 2020-06-12 ENCOUNTER — Other Ambulatory Visit: Payer: Self-pay

## 2020-06-12 ENCOUNTER — Ambulatory Visit (INDEPENDENT_AMBULATORY_CARE_PROVIDER_SITE_OTHER): Payer: Medicare Other | Admitting: Family Medicine

## 2020-06-12 VITALS — BP 170/90 | HR 67 | Temp 97.2°F | Wt 239.8 lb

## 2020-06-12 DIAGNOSIS — I10 Essential (primary) hypertension: Secondary | ICD-10-CM | POA: Diagnosis not present

## 2020-06-12 DIAGNOSIS — E669 Obesity, unspecified: Secondary | ICD-10-CM

## 2020-06-12 DIAGNOSIS — E785 Hyperlipidemia, unspecified: Secondary | ICD-10-CM

## 2020-06-12 DIAGNOSIS — E119 Type 2 diabetes mellitus without complications: Secondary | ICD-10-CM | POA: Diagnosis not present

## 2020-06-12 LAB — POCT GLYCOSYLATED HEMOGLOBIN (HGB A1C): Hemoglobin A1C: 6.8 % — AB (ref 4.0–5.6)

## 2020-06-12 NOTE — Progress Notes (Signed)
  Subjective:    Patient ID: [name] [name], male    DOB: Apr 22, 1950, 70 y.o.   MRN: [id number]  [name] [name] is a 70 y.o. male who presents for follow-up of Type 2 diabetes mellitus.  Home blood sugar records: pt not checking Current symptoms/problems include none at this time. Daily foot checks:yes   Any foot concerns: none  Exercise: staying active as much as he can Diet: good Further history from him indicates that in June and July of last year he was given steroids to help with his back pain but did not mention that to me.  This is around the time that his hemoglobin A1c what up to 7.  He has been involved with chronic back pain and has had steroids on several occasions, epidural, physical therapy, chiropractic manipulation and at one point was on gabapentin.  He is scheduled for an MRI on his back.  The back pain is interfered with his ability to take better care of himself physically. The following portions of the patient's history were reviewed and updated as appropriate: allergies, current medications, past medical history, past social history and problem list.  ROS as in subjective above.     Objective:    Physical Exam Alert and in no distress otherwise not examined.  Foot exam is recorded.  Hemoglobin A1c is 6.8. Lab Review Diabetic Labs Latest Ref Rng & Units 06/12/2020 02/14/2020 11/03/2019 11/02/2019 11/01/2019  HbA1c 4.0 - 5.6 % 6.8(A) 6.7(A) - - -  Chol 100 - 199 mg/dL - 211(H) - - -  HDL >39 mg/dL - 49 - - -  Calc LDL 0 - 99 mg/dL - 140(H) - - -  Triglycerides 0 - 149 mg/dL - 125 - - -  Creatinine 0.61 - 1.24 mg/dL - - 0.76 0.99 1.11   BP/Weight 06/12/2020 02/14/2020 11/07/2019 11/03/2019 4/58/0998  Systolic BP 338 250 539 767 -  Diastolic BP 90 80 76 78 -  Wt. (Lbs) 239.8 233.8 227.6 - 235  BMI 34.9 34.03 31.74 - 32.78   Foot/eye exam completion dates 06/12/2020  Foot Form Completion Done    [name] [name]  reports that he has never smoked. He has never used smokeless tobacco.  He reports current alcohol use of about 14.0 standard drinks of alcohol per week. He reports that he does not use drugs.     Assessment & Plan:    New onset type 2 diabetes mellitus ([place]) - Plan: POCT glycosylated hemoglobin (Hb A1C)  Primary hypertension  Hyperlipidemia with target LDL less than 100  Obesity (BMI 30-39.9)  1. Rx changes: none 2. Education: Reviewed 'ABCs' of diabetes management (respective goals in parentheses):  A1C (<7), blood pressure (<130/80), and cholesterol (LDL <100). 3. Compliance at present is estimated to be good. Efforts to improve compliance (if necessary) will be directed at increased exercise. 4. Follow up: 4 months I explained with the new data concerning steroids that that could have affected his hemoglobin A1c.  He will not take anymore steroids.  We plan to check him again in 4 months to see what his hemoglobin A1c is.  Explained the fact that we could at some point possibly relabel him diabetes in remission.

## 2020-06-19 ENCOUNTER — Telehealth: Payer: Self-pay | Admitting: Family Medicine

## 2020-06-19 NOTE — Telephone Encounter (Signed)
Requested records received from [facility]

## 2020-06-26 DIAGNOSIS — M9903 Segmental and somatic dysfunction of lumbar region: Secondary | ICD-10-CM | POA: Diagnosis not present

## 2020-06-26 DIAGNOSIS — M5442 Lumbago with sciatica, left side: Secondary | ICD-10-CM | POA: Diagnosis not present

## 2020-07-03 DIAGNOSIS — M9903 Segmental and somatic dysfunction of lumbar region: Secondary | ICD-10-CM | POA: Diagnosis not present

## 2020-07-03 DIAGNOSIS — M5442 Lumbago with sciatica, left side: Secondary | ICD-10-CM | POA: Diagnosis not present

## 2020-07-09 DIAGNOSIS — M9905 Segmental and somatic dysfunction of pelvic region: Secondary | ICD-10-CM | POA: Diagnosis not present

## 2020-07-09 DIAGNOSIS — M5388 Other specified dorsopathies, sacral and sacrococcygeal region: Secondary | ICD-10-CM | POA: Diagnosis not present

## 2020-07-09 DIAGNOSIS — M4727 Other spondylosis with radiculopathy, lumbosacral region: Secondary | ICD-10-CM | POA: Diagnosis not present

## 2020-07-09 DIAGNOSIS — M9904 Segmental and somatic dysfunction of sacral region: Secondary | ICD-10-CM | POA: Diagnosis not present

## 2020-07-09 DIAGNOSIS — M9903 Segmental and somatic dysfunction of lumbar region: Secondary | ICD-10-CM | POA: Diagnosis not present

## 2020-07-09 DIAGNOSIS — M5431 Sciatica, right side: Secondary | ICD-10-CM | POA: Diagnosis not present

## 2020-07-11 ENCOUNTER — Encounter: Payer: Self-pay | Admitting: Family Medicine

## 2020-07-13 DIAGNOSIS — M9903 Segmental and somatic dysfunction of lumbar region: Secondary | ICD-10-CM | POA: Diagnosis not present

## 2020-07-13 DIAGNOSIS — M5431 Sciatica, right side: Secondary | ICD-10-CM | POA: Diagnosis not present

## 2020-07-13 DIAGNOSIS — M9905 Segmental and somatic dysfunction of pelvic region: Secondary | ICD-10-CM | POA: Diagnosis not present

## 2020-07-13 DIAGNOSIS — M4727 Other spondylosis with radiculopathy, lumbosacral region: Secondary | ICD-10-CM | POA: Diagnosis not present

## 2020-07-13 DIAGNOSIS — M5388 Other specified dorsopathies, sacral and sacrococcygeal region: Secondary | ICD-10-CM | POA: Diagnosis not present

## 2020-07-13 DIAGNOSIS — M9904 Segmental and somatic dysfunction of sacral region: Secondary | ICD-10-CM | POA: Diagnosis not present

## 2020-07-17 DIAGNOSIS — M5388 Other specified dorsopathies, sacral and sacrococcygeal region: Secondary | ICD-10-CM | POA: Diagnosis not present

## 2020-07-17 DIAGNOSIS — M9904 Segmental and somatic dysfunction of sacral region: Secondary | ICD-10-CM | POA: Diagnosis not present

## 2020-07-17 DIAGNOSIS — M9905 Segmental and somatic dysfunction of pelvic region: Secondary | ICD-10-CM | POA: Diagnosis not present

## 2020-07-17 DIAGNOSIS — M5431 Sciatica, right side: Secondary | ICD-10-CM | POA: Diagnosis not present

## 2020-07-17 DIAGNOSIS — M9903 Segmental and somatic dysfunction of lumbar region: Secondary | ICD-10-CM | POA: Diagnosis not present

## 2020-07-17 DIAGNOSIS — M4727 Other spondylosis with radiculopathy, lumbosacral region: Secondary | ICD-10-CM | POA: Diagnosis not present

## 2020-07-19 DIAGNOSIS — M9904 Segmental and somatic dysfunction of sacral region: Secondary | ICD-10-CM | POA: Diagnosis not present

## 2020-07-19 DIAGNOSIS — M5388 Other specified dorsopathies, sacral and sacrococcygeal region: Secondary | ICD-10-CM | POA: Diagnosis not present

## 2020-07-19 DIAGNOSIS — M9905 Segmental and somatic dysfunction of pelvic region: Secondary | ICD-10-CM | POA: Diagnosis not present

## 2020-07-19 DIAGNOSIS — M5431 Sciatica, right side: Secondary | ICD-10-CM | POA: Diagnosis not present

## 2020-07-19 DIAGNOSIS — M9903 Segmental and somatic dysfunction of lumbar region: Secondary | ICD-10-CM | POA: Diagnosis not present

## 2020-07-19 DIAGNOSIS — M4727 Other spondylosis with radiculopathy, lumbosacral region: Secondary | ICD-10-CM | POA: Diagnosis not present

## 2020-07-23 DIAGNOSIS — M5388 Other specified dorsopathies, sacral and sacrococcygeal region: Secondary | ICD-10-CM | POA: Diagnosis not present

## 2020-07-23 DIAGNOSIS — M9904 Segmental and somatic dysfunction of sacral region: Secondary | ICD-10-CM | POA: Diagnosis not present

## 2020-07-23 DIAGNOSIS — M9905 Segmental and somatic dysfunction of pelvic region: Secondary | ICD-10-CM | POA: Diagnosis not present

## 2020-07-23 DIAGNOSIS — M4727 Other spondylosis with radiculopathy, lumbosacral region: Secondary | ICD-10-CM | POA: Diagnosis not present

## 2020-07-23 DIAGNOSIS — M9903 Segmental and somatic dysfunction of lumbar region: Secondary | ICD-10-CM | POA: Diagnosis not present

## 2020-07-23 DIAGNOSIS — M5431 Sciatica, right side: Secondary | ICD-10-CM | POA: Diagnosis not present

## 2020-07-30 DIAGNOSIS — M9903 Segmental and somatic dysfunction of lumbar region: Secondary | ICD-10-CM | POA: Diagnosis not present

## 2020-07-30 DIAGNOSIS — M4727 Other spondylosis with radiculopathy, lumbosacral region: Secondary | ICD-10-CM | POA: Diagnosis not present

## 2020-07-30 DIAGNOSIS — M5431 Sciatica, right side: Secondary | ICD-10-CM | POA: Diagnosis not present

## 2020-07-30 DIAGNOSIS — M9905 Segmental and somatic dysfunction of pelvic region: Secondary | ICD-10-CM | POA: Diagnosis not present

## 2020-07-30 DIAGNOSIS — M9904 Segmental and somatic dysfunction of sacral region: Secondary | ICD-10-CM | POA: Diagnosis not present

## 2020-07-30 DIAGNOSIS — M5388 Other specified dorsopathies, sacral and sacrococcygeal region: Secondary | ICD-10-CM | POA: Diagnosis not present

## 2020-08-09 DIAGNOSIS — M9904 Segmental and somatic dysfunction of sacral region: Secondary | ICD-10-CM | POA: Diagnosis not present

## 2020-08-09 DIAGNOSIS — M5388 Other specified dorsopathies, sacral and sacrococcygeal region: Secondary | ICD-10-CM | POA: Diagnosis not present

## 2020-08-09 DIAGNOSIS — M47812 Spondylosis without myelopathy or radiculopathy, cervical region: Secondary | ICD-10-CM | POA: Diagnosis not present

## 2020-08-09 DIAGNOSIS — M47817 Spondylosis without myelopathy or radiculopathy, lumbosacral region: Secondary | ICD-10-CM | POA: Diagnosis not present

## 2020-08-09 DIAGNOSIS — M9903 Segmental and somatic dysfunction of lumbar region: Secondary | ICD-10-CM | POA: Diagnosis not present

## 2020-08-09 DIAGNOSIS — M9901 Segmental and somatic dysfunction of cervical region: Secondary | ICD-10-CM | POA: Diagnosis not present

## 2020-08-16 ENCOUNTER — Encounter: Payer: Self-pay | Admitting: Family Medicine

## 2020-08-16 DIAGNOSIS — M47812 Spondylosis without myelopathy or radiculopathy, cervical region: Secondary | ICD-10-CM | POA: Diagnosis not present

## 2020-08-16 DIAGNOSIS — M9904 Segmental and somatic dysfunction of sacral region: Secondary | ICD-10-CM | POA: Diagnosis not present

## 2020-08-16 DIAGNOSIS — M9901 Segmental and somatic dysfunction of cervical region: Secondary | ICD-10-CM | POA: Diagnosis not present

## 2020-08-16 DIAGNOSIS — M5388 Other specified dorsopathies, sacral and sacrococcygeal region: Secondary | ICD-10-CM | POA: Diagnosis not present

## 2020-08-16 DIAGNOSIS — M47817 Spondylosis without myelopathy or radiculopathy, lumbosacral region: Secondary | ICD-10-CM | POA: Diagnosis not present

## 2020-08-16 DIAGNOSIS — M9903 Segmental and somatic dysfunction of lumbar region: Secondary | ICD-10-CM | POA: Diagnosis not present

## 2020-08-27 DIAGNOSIS — M47817 Spondylosis without myelopathy or radiculopathy, lumbosacral region: Secondary | ICD-10-CM | POA: Diagnosis not present

## 2020-08-27 DIAGNOSIS — M47812 Spondylosis without myelopathy or radiculopathy, cervical region: Secondary | ICD-10-CM | POA: Diagnosis not present

## 2020-08-27 DIAGNOSIS — M5388 Other specified dorsopathies, sacral and sacrococcygeal region: Secondary | ICD-10-CM | POA: Diagnosis not present

## 2020-08-27 DIAGNOSIS — M9901 Segmental and somatic dysfunction of cervical region: Secondary | ICD-10-CM | POA: Diagnosis not present

## 2020-08-27 DIAGNOSIS — M9904 Segmental and somatic dysfunction of sacral region: Secondary | ICD-10-CM | POA: Diagnosis not present

## 2020-08-27 DIAGNOSIS — M9903 Segmental and somatic dysfunction of lumbar region: Secondary | ICD-10-CM | POA: Diagnosis not present

## 2020-08-30 DIAGNOSIS — M25572 Pain in left ankle and joints of left foot: Secondary | ICD-10-CM | POA: Diagnosis not present

## 2020-09-04 DIAGNOSIS — M25572 Pain in left ankle and joints of left foot: Secondary | ICD-10-CM | POA: Diagnosis not present

## 2020-09-13 DIAGNOSIS — M47812 Spondylosis without myelopathy or radiculopathy, cervical region: Secondary | ICD-10-CM | POA: Diagnosis not present

## 2020-09-13 DIAGNOSIS — M47817 Spondylosis without myelopathy or radiculopathy, lumbosacral region: Secondary | ICD-10-CM | POA: Diagnosis not present

## 2020-09-13 DIAGNOSIS — M9904 Segmental and somatic dysfunction of sacral region: Secondary | ICD-10-CM | POA: Diagnosis not present

## 2020-09-13 DIAGNOSIS — M9903 Segmental and somatic dysfunction of lumbar region: Secondary | ICD-10-CM | POA: Diagnosis not present

## 2020-09-13 DIAGNOSIS — M5388 Other specified dorsopathies, sacral and sacrococcygeal region: Secondary | ICD-10-CM | POA: Diagnosis not present

## 2020-09-13 DIAGNOSIS — M9901 Segmental and somatic dysfunction of cervical region: Secondary | ICD-10-CM | POA: Diagnosis not present

## 2020-09-25 ENCOUNTER — Encounter: Payer: Self-pay | Admitting: Family Medicine

## 2020-09-27 DIAGNOSIS — M9901 Segmental and somatic dysfunction of cervical region: Secondary | ICD-10-CM | POA: Diagnosis not present

## 2020-09-27 DIAGNOSIS — M5388 Other specified dorsopathies, sacral and sacrococcygeal region: Secondary | ICD-10-CM | POA: Diagnosis not present

## 2020-09-27 DIAGNOSIS — M9903 Segmental and somatic dysfunction of lumbar region: Secondary | ICD-10-CM | POA: Diagnosis not present

## 2020-09-27 DIAGNOSIS — M47813 Spondylosis without myelopathy or radiculopathy, cervicothoracic region: Secondary | ICD-10-CM | POA: Diagnosis not present

## 2020-09-27 DIAGNOSIS — M9904 Segmental and somatic dysfunction of sacral region: Secondary | ICD-10-CM | POA: Diagnosis not present

## 2020-09-27 DIAGNOSIS — M47897 Other spondylosis, lumbosacral region: Secondary | ICD-10-CM | POA: Diagnosis not present

## 2020-10-15 DIAGNOSIS — M47897 Other spondylosis, lumbosacral region: Secondary | ICD-10-CM | POA: Diagnosis not present

## 2020-10-15 DIAGNOSIS — M9903 Segmental and somatic dysfunction of lumbar region: Secondary | ICD-10-CM | POA: Diagnosis not present

## 2020-10-15 DIAGNOSIS — M9904 Segmental and somatic dysfunction of sacral region: Secondary | ICD-10-CM | POA: Diagnosis not present

## 2020-10-15 DIAGNOSIS — M9901 Segmental and somatic dysfunction of cervical region: Secondary | ICD-10-CM | POA: Diagnosis not present

## 2020-10-15 DIAGNOSIS — M5388 Other specified dorsopathies, sacral and sacrococcygeal region: Secondary | ICD-10-CM | POA: Diagnosis not present

## 2020-10-15 DIAGNOSIS — M47813 Spondylosis without myelopathy or radiculopathy, cervicothoracic region: Secondary | ICD-10-CM | POA: Diagnosis not present

## 2020-10-16 ENCOUNTER — Other Ambulatory Visit: Payer: Self-pay | Admitting: Family Medicine

## 2020-10-16 ENCOUNTER — Other Ambulatory Visit: Payer: Self-pay

## 2020-10-16 ENCOUNTER — Ambulatory Visit (INDEPENDENT_AMBULATORY_CARE_PROVIDER_SITE_OTHER): Payer: Medicare Other | Admitting: Family Medicine

## 2020-10-16 VITALS — BP 138/82 | HR 66 | Temp 98.5°F | Ht 70.5 in | Wt 243.4 lb

## 2020-10-16 DIAGNOSIS — N529 Male erectile dysfunction, unspecified: Secondary | ICD-10-CM | POA: Diagnosis not present

## 2020-10-16 DIAGNOSIS — E1159 Type 2 diabetes mellitus with other circulatory complications: Secondary | ICD-10-CM

## 2020-10-16 DIAGNOSIS — I152 Hypertension secondary to endocrine disorders: Secondary | ICD-10-CM

## 2020-10-16 DIAGNOSIS — Z23 Encounter for immunization: Secondary | ICD-10-CM

## 2020-10-16 DIAGNOSIS — E669 Obesity, unspecified: Secondary | ICD-10-CM | POA: Diagnosis not present

## 2020-10-16 DIAGNOSIS — E119 Type 2 diabetes mellitus without complications: Secondary | ICD-10-CM

## 2020-10-16 DIAGNOSIS — E118 Type 2 diabetes mellitus with unspecified complications: Secondary | ICD-10-CM | POA: Insufficient documentation

## 2020-10-16 DIAGNOSIS — I700 Atherosclerosis of aorta: Secondary | ICD-10-CM | POA: Insufficient documentation

## 2020-10-16 DIAGNOSIS — E785 Hyperlipidemia, unspecified: Secondary | ICD-10-CM | POA: Diagnosis not present

## 2020-10-16 DIAGNOSIS — E291 Testicular hypofunction: Secondary | ICD-10-CM | POA: Diagnosis not present

## 2020-10-16 DIAGNOSIS — E1169 Type 2 diabetes mellitus with other specified complication: Secondary | ICD-10-CM | POA: Diagnosis not present

## 2020-10-16 DIAGNOSIS — E1165 Type 2 diabetes mellitus with hyperglycemia: Secondary | ICD-10-CM | POA: Insufficient documentation

## 2020-10-16 LAB — POCT GLYCOSYLATED HEMOGLOBIN (HGB A1C): Hemoglobin A1C: 7 % — AB (ref 4.0–5.6)

## 2020-10-16 MED ORDER — ACCU-CHEK FASTCLIX LANCETS MISC: 1.0000 | Freq: Every day | 3 refills | Status: AC

## 2020-10-16 MED ORDER — GLUCOSE BLOOD VI STRP: ORAL_STRIP | 12 refills | Status: DC

## 2020-10-16 NOTE — Patient Instructions (Signed)
Go to the [place] [employer] website and learn more about diabetes or go to [email or web address]

## 2020-10-16 NOTE — Progress Notes (Signed)
Subjective:    Patient ID: MAKIA GONSALEZ, male    DOB: 03-23-1950, 70 y.o.   MRN: [id number]  YOSMAR JOHNES is a 70 y.o. male who presents for follow-up of Type 2 diabetes mellitus.  Home blood sugar records:  not checking Current symptoms/problems include none at this time. Daily foot checks: yes   Any foot concerns: none Exercise:  staying active Diet:good There was a question about whether he truly had diabetes because he was on steroids for a long period of time however he has been off steroids for a several months dating back to January.  His previous hemoglobin A1c was 6.8 in March.  He has not been checking his sugars because he was not trained to do that yet.  He is taking his Zocor.  He does have a history of aortic atherosclerosis and he also qualifies based on his age as opposed to just diabetes.  He is on 3 pumps of AndroGel and needs follow-up on that.  He continues on amlodipine/valsartan and is having no trouble with that.  Does use sildenafil on an as-needed basis.  He would like his next [place] shot as he is getting ready to go to [place]. The following portions of the patient's history were reviewed and updated as appropriate: allergies, current medications, past medical history, past social history and problem list.  ROS as in subjective above.     Objective:    Physical Exam Alert and in no distress otherwise not examined.  Hemoglobin A1c is 7.0  Lab Review Diabetic Labs Latest Ref Rng & Units 10/16/2020 06/12/2020 02/14/2020 11/03/2019 11/02/2019  HbA1c 4.0 - 5.6 % 7.0(A) 6.8(A) 6.7(A) - -  Chol 100 - 199 mg/dL - - 211(H) - -  HDL >39 mg/dL - - 49 - -  Calc LDL 0 - 99 mg/dL - - 140(H) - -  Triglycerides 0 - 149 mg/dL - - 125 - -  Creatinine 0.61 - 1.24 mg/dL - - - 0.76 0.99   BP/Weight 10/16/2020 06/12/2020 02/14/2020 11/07/2019 [id number]  Systolic BP 0000000 [id number] 0000000 0000000 0000000  Diastolic BP 82 90 80 76 78  Wt. (Lbs) 243.4 239.8 233.8 227.6 -  BMI 34.43 34.9 34.03  31.74 -   Foot/eye exam completion dates 06/12/2020  Foot Form Completion Done    Markece  reports that he has never smoked. He has never used smokeless tobacco. He reports current alcohol use of about 14.0 standard drinks of alcohol per week. He reports that he does not use drugs.     Assessment & Plan:    Type 2 diabetes mellitus without complication, without long-term current use of insulin (HCC) - Plan: POCT glycosylated hemoglobin (Hb A1C)  Hypogonadism male - Plan: Testosterone  Obesity (BMI 30-39.9)  Immunization, viral disease - Plan: Moderna Covid-19 Booster  Aortic atherosclerosis ([place])  Hypertension associated with diabetes ([place])  Hyperlipidemia associated with type 2 diabetes mellitus ([place])  Erectile dysfunction, unspecified erectile dysfunction type  Rx changes: none Education: Reviewed 'ABCs' of diabetes management (respective goals in parentheses):  A1C (<7), blood pressure (<130/80), and cholesterol (LDL <100). Compliance at present is estimated to be good. Efforts to improve compliance (if necessary) will be directed at increased exercise. Follow up: 4 months I explained that since he has been off steroids, his present diabetes status is truly diabetic and not related to steroids.  Discussed with the foundation of care with diet and exercise.  Also discussed cutting back on carbohydrates and increasing his physical  activity 20 minutes of something daily to help get rid of the abdominal fat.  He was instructed on proper use of glucometer and when to check it in terms of before meals or 2 hours after meal.  Recheck here in 4 months.

## 2020-10-17 DIAGNOSIS — M9903 Segmental and somatic dysfunction of lumbar region: Secondary | ICD-10-CM | POA: Diagnosis not present

## 2020-10-17 DIAGNOSIS — M9901 Segmental and somatic dysfunction of cervical region: Secondary | ICD-10-CM | POA: Diagnosis not present

## 2020-10-17 DIAGNOSIS — M5431 Sciatica, right side: Secondary | ICD-10-CM | POA: Diagnosis not present

## 2020-10-17 DIAGNOSIS — M9904 Segmental and somatic dysfunction of sacral region: Secondary | ICD-10-CM | POA: Diagnosis not present

## 2020-10-17 DIAGNOSIS — M47812 Spondylosis without myelopathy or radiculopathy, cervical region: Secondary | ICD-10-CM | POA: Diagnosis not present

## 2020-10-17 DIAGNOSIS — M5388 Other specified dorsopathies, sacral and sacrococcygeal region: Secondary | ICD-10-CM | POA: Diagnosis not present

## 2020-10-17 LAB — TESTOSTERONE: Testosterone: 661 ng/dL (ref 264–916)

## 2020-11-01 DIAGNOSIS — M9901 Segmental and somatic dysfunction of cervical region: Secondary | ICD-10-CM | POA: Diagnosis not present

## 2020-11-01 DIAGNOSIS — M5388 Other specified dorsopathies, sacral and sacrococcygeal region: Secondary | ICD-10-CM | POA: Diagnosis not present

## 2020-11-01 DIAGNOSIS — M9903 Segmental and somatic dysfunction of lumbar region: Secondary | ICD-10-CM | POA: Diagnosis not present

## 2020-11-01 DIAGNOSIS — M9904 Segmental and somatic dysfunction of sacral region: Secondary | ICD-10-CM | POA: Diagnosis not present

## 2020-11-01 DIAGNOSIS — M47812 Spondylosis without myelopathy or radiculopathy, cervical region: Secondary | ICD-10-CM | POA: Diagnosis not present

## 2020-11-01 DIAGNOSIS — M5431 Sciatica, right side: Secondary | ICD-10-CM | POA: Diagnosis not present

## 2020-11-22 DIAGNOSIS — M5388 Other specified dorsopathies, sacral and sacrococcygeal region: Secondary | ICD-10-CM | POA: Diagnosis not present

## 2020-11-22 DIAGNOSIS — M9903 Segmental and somatic dysfunction of lumbar region: Secondary | ICD-10-CM | POA: Diagnosis not present

## 2020-11-22 DIAGNOSIS — M9904 Segmental and somatic dysfunction of sacral region: Secondary | ICD-10-CM | POA: Diagnosis not present

## 2020-11-22 DIAGNOSIS — M9901 Segmental and somatic dysfunction of cervical region: Secondary | ICD-10-CM | POA: Diagnosis not present

## 2020-11-22 DIAGNOSIS — M47812 Spondylosis without myelopathy or radiculopathy, cervical region: Secondary | ICD-10-CM | POA: Diagnosis not present

## 2020-11-22 DIAGNOSIS — M47817 Spondylosis without myelopathy or radiculopathy, lumbosacral region: Secondary | ICD-10-CM | POA: Diagnosis not present

## 2020-11-28 DIAGNOSIS — M5388 Other specified dorsopathies, sacral and sacrococcygeal region: Secondary | ICD-10-CM | POA: Diagnosis not present

## 2020-11-28 DIAGNOSIS — M47812 Spondylosis without myelopathy or radiculopathy, cervical region: Secondary | ICD-10-CM | POA: Diagnosis not present

## 2020-11-28 DIAGNOSIS — M5431 Sciatica, right side: Secondary | ICD-10-CM | POA: Diagnosis not present

## 2020-11-28 DIAGNOSIS — M9905 Segmental and somatic dysfunction of pelvic region: Secondary | ICD-10-CM | POA: Diagnosis not present

## 2020-11-28 DIAGNOSIS — M9904 Segmental and somatic dysfunction of sacral region: Secondary | ICD-10-CM | POA: Diagnosis not present

## 2020-11-28 DIAGNOSIS — M9901 Segmental and somatic dysfunction of cervical region: Secondary | ICD-10-CM | POA: Diagnosis not present

## 2020-12-10 DIAGNOSIS — M9904 Segmental and somatic dysfunction of sacral region: Secondary | ICD-10-CM | POA: Diagnosis not present

## 2020-12-10 DIAGNOSIS — M5431 Sciatica, right side: Secondary | ICD-10-CM | POA: Diagnosis not present

## 2020-12-10 DIAGNOSIS — M47812 Spondylosis without myelopathy or radiculopathy, cervical region: Secondary | ICD-10-CM | POA: Diagnosis not present

## 2020-12-10 DIAGNOSIS — M9905 Segmental and somatic dysfunction of pelvic region: Secondary | ICD-10-CM | POA: Diagnosis not present

## 2020-12-10 DIAGNOSIS — M9901 Segmental and somatic dysfunction of cervical region: Secondary | ICD-10-CM | POA: Diagnosis not present

## 2020-12-10 DIAGNOSIS — M5388 Other specified dorsopathies, sacral and sacrococcygeal region: Secondary | ICD-10-CM | POA: Diagnosis not present

## 2020-12-11 DIAGNOSIS — M9901 Segmental and somatic dysfunction of cervical region: Secondary | ICD-10-CM | POA: Diagnosis not present

## 2020-12-11 DIAGNOSIS — M5431 Sciatica, right side: Secondary | ICD-10-CM | POA: Diagnosis not present

## 2020-12-11 DIAGNOSIS — M5388 Other specified dorsopathies, sacral and sacrococcygeal region: Secondary | ICD-10-CM | POA: Diagnosis not present

## 2020-12-11 DIAGNOSIS — M9904 Segmental and somatic dysfunction of sacral region: Secondary | ICD-10-CM | POA: Diagnosis not present

## 2020-12-11 DIAGNOSIS — M9905 Segmental and somatic dysfunction of pelvic region: Secondary | ICD-10-CM | POA: Diagnosis not present

## 2020-12-11 DIAGNOSIS — M47812 Spondylosis without myelopathy or radiculopathy, cervical region: Secondary | ICD-10-CM | POA: Diagnosis not present

## 2020-12-16 ENCOUNTER — Other Ambulatory Visit: Payer: Self-pay | Admitting: Family Medicine

## 2020-12-16 DIAGNOSIS — E291 Testicular hypofunction: Secondary | ICD-10-CM

## 2020-12-17 NOTE — Telephone Encounter (Signed)
[facility] is requesting to fill pt testosterone . Please advise [facility]

## 2020-12-18 DIAGNOSIS — M9901 Segmental and somatic dysfunction of cervical region: Secondary | ICD-10-CM | POA: Diagnosis not present

## 2020-12-18 DIAGNOSIS — M9904 Segmental and somatic dysfunction of sacral region: Secondary | ICD-10-CM | POA: Diagnosis not present

## 2020-12-18 DIAGNOSIS — M5431 Sciatica, right side: Secondary | ICD-10-CM | POA: Diagnosis not present

## 2020-12-18 DIAGNOSIS — M5388 Other specified dorsopathies, sacral and sacrococcygeal region: Secondary | ICD-10-CM | POA: Diagnosis not present

## 2020-12-18 DIAGNOSIS — M9905 Segmental and somatic dysfunction of pelvic region: Secondary | ICD-10-CM | POA: Diagnosis not present

## 2020-12-18 DIAGNOSIS — M47812 Spondylosis without myelopathy or radiculopathy, cervical region: Secondary | ICD-10-CM | POA: Diagnosis not present

## 2020-12-19 DIAGNOSIS — M545 Low back pain, unspecified: Secondary | ICD-10-CM | POA: Diagnosis not present

## 2020-12-21 DIAGNOSIS — M47812 Spondylosis without myelopathy or radiculopathy, cervical region: Secondary | ICD-10-CM | POA: Diagnosis not present

## 2020-12-21 DIAGNOSIS — M9901 Segmental and somatic dysfunction of cervical region: Secondary | ICD-10-CM | POA: Diagnosis not present

## 2020-12-21 DIAGNOSIS — M5388 Other specified dorsopathies, sacral and sacrococcygeal region: Secondary | ICD-10-CM | POA: Diagnosis not present

## 2020-12-21 DIAGNOSIS — M9903 Segmental and somatic dysfunction of lumbar region: Secondary | ICD-10-CM | POA: Diagnosis not present

## 2020-12-21 DIAGNOSIS — M5431 Sciatica, right side: Secondary | ICD-10-CM | POA: Diagnosis not present

## 2020-12-21 DIAGNOSIS — M9904 Segmental and somatic dysfunction of sacral region: Secondary | ICD-10-CM | POA: Diagnosis not present

## 2020-12-24 DIAGNOSIS — M5388 Other specified dorsopathies, sacral and sacrococcygeal region: Secondary | ICD-10-CM | POA: Diagnosis not present

## 2020-12-24 DIAGNOSIS — M9904 Segmental and somatic dysfunction of sacral region: Secondary | ICD-10-CM | POA: Diagnosis not present

## 2020-12-24 DIAGNOSIS — M9903 Segmental and somatic dysfunction of lumbar region: Secondary | ICD-10-CM | POA: Diagnosis not present

## 2020-12-24 DIAGNOSIS — M9901 Segmental and somatic dysfunction of cervical region: Secondary | ICD-10-CM | POA: Diagnosis not present

## 2020-12-24 DIAGNOSIS — M5431 Sciatica, right side: Secondary | ICD-10-CM | POA: Diagnosis not present

## 2020-12-24 DIAGNOSIS — M47812 Spondylosis without myelopathy or radiculopathy, cervical region: Secondary | ICD-10-CM | POA: Diagnosis not present

## 2020-12-25 DIAGNOSIS — M5416 Radiculopathy, lumbar region: Secondary | ICD-10-CM | POA: Diagnosis not present

## 2020-12-27 DIAGNOSIS — M9901 Segmental and somatic dysfunction of cervical region: Secondary | ICD-10-CM | POA: Diagnosis not present

## 2020-12-27 DIAGNOSIS — M5431 Sciatica, right side: Secondary | ICD-10-CM | POA: Diagnosis not present

## 2020-12-27 DIAGNOSIS — M5388 Other specified dorsopathies, sacral and sacrococcygeal region: Secondary | ICD-10-CM | POA: Diagnosis not present

## 2020-12-27 DIAGNOSIS — M9904 Segmental and somatic dysfunction of sacral region: Secondary | ICD-10-CM | POA: Diagnosis not present

## 2020-12-27 DIAGNOSIS — M9903 Segmental and somatic dysfunction of lumbar region: Secondary | ICD-10-CM | POA: Diagnosis not present

## 2020-12-27 DIAGNOSIS — M47812 Spondylosis without myelopathy or radiculopathy, cervical region: Secondary | ICD-10-CM | POA: Diagnosis not present

## 2021-01-02 DIAGNOSIS — M47812 Spondylosis without myelopathy or radiculopathy, cervical region: Secondary | ICD-10-CM | POA: Diagnosis not present

## 2021-01-02 DIAGNOSIS — M5431 Sciatica, right side: Secondary | ICD-10-CM | POA: Diagnosis not present

## 2021-01-02 DIAGNOSIS — M5388 Other specified dorsopathies, sacral and sacrococcygeal region: Secondary | ICD-10-CM | POA: Diagnosis not present

## 2021-01-02 DIAGNOSIS — M9903 Segmental and somatic dysfunction of lumbar region: Secondary | ICD-10-CM | POA: Diagnosis not present

## 2021-01-02 DIAGNOSIS — M9904 Segmental and somatic dysfunction of sacral region: Secondary | ICD-10-CM | POA: Diagnosis not present

## 2021-01-02 DIAGNOSIS — M9901 Segmental and somatic dysfunction of cervical region: Secondary | ICD-10-CM | POA: Diagnosis not present

## 2021-01-07 DIAGNOSIS — M48061 Spinal stenosis, lumbar region without neurogenic claudication: Secondary | ICD-10-CM | POA: Diagnosis not present

## 2021-01-07 DIAGNOSIS — M545 Low back pain, unspecified: Secondary | ICD-10-CM | POA: Diagnosis not present

## 2021-01-07 DIAGNOSIS — Z23 Encounter for immunization: Secondary | ICD-10-CM | POA: Diagnosis not present

## 2021-01-08 DIAGNOSIS — Z08 Encounter for follow-up examination after completed treatment for malignant neoplasm: Secondary | ICD-10-CM | POA: Diagnosis not present

## 2021-01-08 DIAGNOSIS — L570 Actinic keratosis: Secondary | ICD-10-CM | POA: Diagnosis not present

## 2021-01-08 DIAGNOSIS — Z85828 Personal history of other malignant neoplasm of skin: Secondary | ICD-10-CM | POA: Diagnosis not present

## 2021-01-08 DIAGNOSIS — L821 Other seborrheic keratosis: Secondary | ICD-10-CM | POA: Diagnosis not present

## 2021-01-08 DIAGNOSIS — L728 Other follicular cysts of the skin and subcutaneous tissue: Secondary | ICD-10-CM | POA: Diagnosis not present

## 2021-01-08 DIAGNOSIS — D225 Melanocytic nevi of trunk: Secondary | ICD-10-CM | POA: Diagnosis not present

## 2021-01-08 DIAGNOSIS — L814 Other melanin hyperpigmentation: Secondary | ICD-10-CM | POA: Diagnosis not present

## 2021-01-09 DIAGNOSIS — M9901 Segmental and somatic dysfunction of cervical region: Secondary | ICD-10-CM | POA: Diagnosis not present

## 2021-01-09 DIAGNOSIS — M9904 Segmental and somatic dysfunction of sacral region: Secondary | ICD-10-CM | POA: Diagnosis not present

## 2021-01-09 DIAGNOSIS — M5431 Sciatica, right side: Secondary | ICD-10-CM | POA: Diagnosis not present

## 2021-01-09 DIAGNOSIS — M9903 Segmental and somatic dysfunction of lumbar region: Secondary | ICD-10-CM | POA: Diagnosis not present

## 2021-01-09 DIAGNOSIS — M5388 Other specified dorsopathies, sacral and sacrococcygeal region: Secondary | ICD-10-CM | POA: Diagnosis not present

## 2021-01-09 DIAGNOSIS — M47812 Spondylosis without myelopathy or radiculopathy, cervical region: Secondary | ICD-10-CM | POA: Diagnosis not present

## 2021-01-15 ENCOUNTER — Other Ambulatory Visit: Payer: Self-pay | Admitting: Family Medicine

## 2021-01-15 DIAGNOSIS — M47812 Spondylosis without myelopathy or radiculopathy, cervical region: Secondary | ICD-10-CM | POA: Diagnosis not present

## 2021-01-15 DIAGNOSIS — M9903 Segmental and somatic dysfunction of lumbar region: Secondary | ICD-10-CM | POA: Diagnosis not present

## 2021-01-15 DIAGNOSIS — M5431 Sciatica, right side: Secondary | ICD-10-CM | POA: Diagnosis not present

## 2021-01-15 DIAGNOSIS — M5388 Other specified dorsopathies, sacral and sacrococcygeal region: Secondary | ICD-10-CM | POA: Diagnosis not present

## 2021-01-15 DIAGNOSIS — I10 Essential (primary) hypertension: Secondary | ICD-10-CM

## 2021-01-15 DIAGNOSIS — M9904 Segmental and somatic dysfunction of sacral region: Secondary | ICD-10-CM | POA: Diagnosis not present

## 2021-01-15 DIAGNOSIS — M9901 Segmental and somatic dysfunction of cervical region: Secondary | ICD-10-CM | POA: Diagnosis not present

## 2021-01-23 DIAGNOSIS — M48061 Spinal stenosis, lumbar region without neurogenic claudication: Secondary | ICD-10-CM | POA: Diagnosis not present

## 2021-01-25 DIAGNOSIS — M47816 Spondylosis without myelopathy or radiculopathy, lumbar region: Secondary | ICD-10-CM | POA: Diagnosis not present

## 2021-01-25 DIAGNOSIS — M5388 Other specified dorsopathies, sacral and sacrococcygeal region: Secondary | ICD-10-CM | POA: Diagnosis not present

## 2021-01-25 DIAGNOSIS — M9903 Segmental and somatic dysfunction of lumbar region: Secondary | ICD-10-CM | POA: Diagnosis not present

## 2021-01-25 DIAGNOSIS — M47812 Spondylosis without myelopathy or radiculopathy, cervical region: Secondary | ICD-10-CM | POA: Diagnosis not present

## 2021-01-25 DIAGNOSIS — M9904 Segmental and somatic dysfunction of sacral region: Secondary | ICD-10-CM | POA: Diagnosis not present

## 2021-01-25 DIAGNOSIS — M9901 Segmental and somatic dysfunction of cervical region: Secondary | ICD-10-CM | POA: Diagnosis not present

## 2021-01-31 DIAGNOSIS — M9904 Segmental and somatic dysfunction of sacral region: Secondary | ICD-10-CM | POA: Diagnosis not present

## 2021-01-31 DIAGNOSIS — M9901 Segmental and somatic dysfunction of cervical region: Secondary | ICD-10-CM | POA: Diagnosis not present

## 2021-01-31 DIAGNOSIS — M47812 Spondylosis without myelopathy or radiculopathy, cervical region: Secondary | ICD-10-CM | POA: Diagnosis not present

## 2021-01-31 DIAGNOSIS — M9903 Segmental and somatic dysfunction of lumbar region: Secondary | ICD-10-CM | POA: Diagnosis not present

## 2021-01-31 DIAGNOSIS — M47816 Spondylosis without myelopathy or radiculopathy, lumbar region: Secondary | ICD-10-CM | POA: Diagnosis not present

## 2021-01-31 DIAGNOSIS — M5388 Other specified dorsopathies, sacral and sacrococcygeal region: Secondary | ICD-10-CM | POA: Diagnosis not present

## 2021-02-05 DIAGNOSIS — E119 Type 2 diabetes mellitus without complications: Secondary | ICD-10-CM | POA: Diagnosis not present

## 2021-02-05 DIAGNOSIS — H5213 Myopia, bilateral: Secondary | ICD-10-CM | POA: Diagnosis not present

## 2021-02-05 DIAGNOSIS — H524 Presbyopia: Secondary | ICD-10-CM | POA: Diagnosis not present

## 2021-02-05 LAB — HM DIABETES EYE EXAM

## 2021-02-12 ENCOUNTER — Encounter: Payer: Self-pay | Admitting: Family Medicine

## 2021-02-14 DIAGNOSIS — M9901 Segmental and somatic dysfunction of cervical region: Secondary | ICD-10-CM | POA: Diagnosis not present

## 2021-02-14 DIAGNOSIS — M9903 Segmental and somatic dysfunction of lumbar region: Secondary | ICD-10-CM | POA: Diagnosis not present

## 2021-02-14 DIAGNOSIS — M9904 Segmental and somatic dysfunction of sacral region: Secondary | ICD-10-CM | POA: Diagnosis not present

## 2021-02-14 DIAGNOSIS — M47896 Other spondylosis, lumbar region: Secondary | ICD-10-CM | POA: Diagnosis not present

## 2021-02-14 DIAGNOSIS — M47812 Spondylosis without myelopathy or radiculopathy, cervical region: Secondary | ICD-10-CM | POA: Diagnosis not present

## 2021-02-14 DIAGNOSIS — M5388 Other specified dorsopathies, sacral and sacrococcygeal region: Secondary | ICD-10-CM | POA: Diagnosis not present

## 2021-02-15 ENCOUNTER — Ambulatory Visit: Payer: Medicare Other | Admitting: Family Medicine

## 2021-02-15 ENCOUNTER — Telehealth: Payer: Self-pay | Admitting: Family Medicine

## 2021-02-15 NOTE — Telephone Encounter (Signed)
Left message for patient to call back and schedule Medicare Annual Wellness Visit (AWV) either virtually or in office. I left my number for patient to call [phone].  Last AWV11/30/21 ; please schedule at anytime with health coach  This should be a 45 minute visit.

## 2021-03-05 DIAGNOSIS — M47896 Other spondylosis, lumbar region: Secondary | ICD-10-CM | POA: Diagnosis not present

## 2021-03-05 DIAGNOSIS — M47812 Spondylosis without myelopathy or radiculopathy, cervical region: Secondary | ICD-10-CM | POA: Diagnosis not present

## 2021-03-05 DIAGNOSIS — M9904 Segmental and somatic dysfunction of sacral region: Secondary | ICD-10-CM | POA: Diagnosis not present

## 2021-03-05 DIAGNOSIS — M9901 Segmental and somatic dysfunction of cervical region: Secondary | ICD-10-CM | POA: Diagnosis not present

## 2021-03-05 DIAGNOSIS — M9903 Segmental and somatic dysfunction of lumbar region: Secondary | ICD-10-CM | POA: Diagnosis not present

## 2021-03-05 DIAGNOSIS — M5388 Other specified dorsopathies, sacral and sacrococcygeal region: Secondary | ICD-10-CM | POA: Diagnosis not present

## 2021-03-05 IMAGING — CT CT ABD-PELV W/ CM
2 of 5 series · 17 of 46 positions shown, 19 images · IV contrast (omnipaque)
Comparison: None.

CLINICAL DATA: Bright red blood per rectum, history of
diverticulitis

EXAM:
CT ABDOMEN AND PELVIS WITH CONTRAST
TECHNIQUE: Multidetector CT imaging of the abdomen and pelvis was performed
using the standard protocol following bolus administration of
intravenous contrast.
CONTRAST:  100mL OMNIPAQUE IOHEXOL 300 MG/ML  SOLN

[Series 2: axial st · axial · 0.95mm/px · z∈[+928,+1343]mm · 14 of 97 slices shown, 16 images]
[im 7/97  soft-tissue]
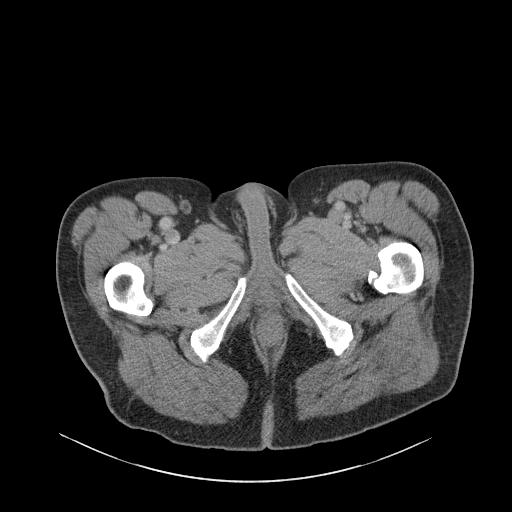
[im 7/97  bone]
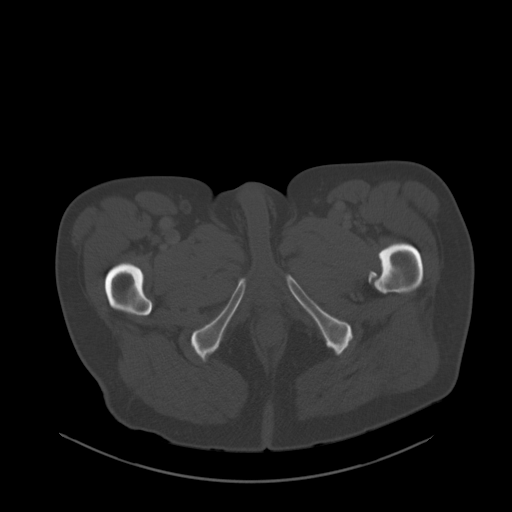
[im 13/97  soft-tissue]
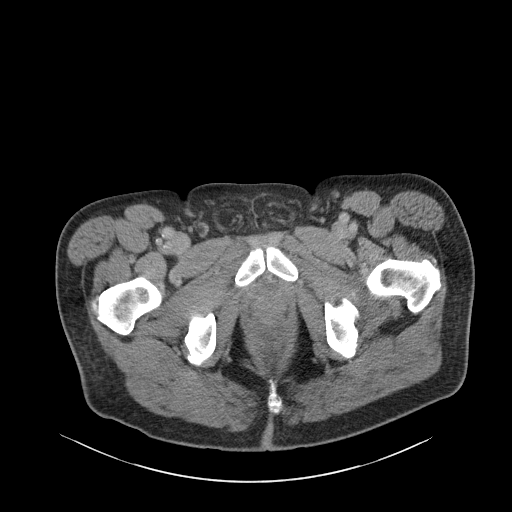
[im 20/97  soft-tissue]
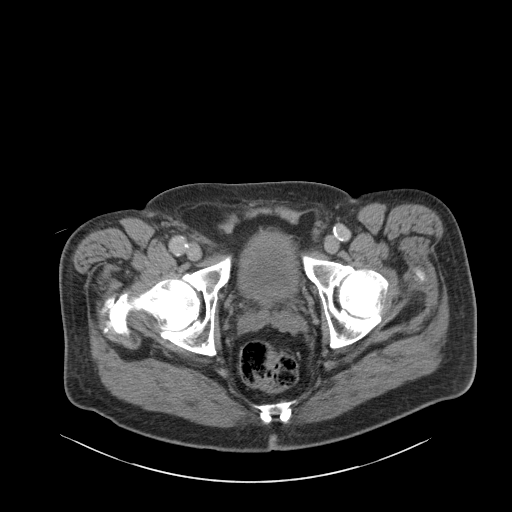
[im 26/97  soft-tissue]
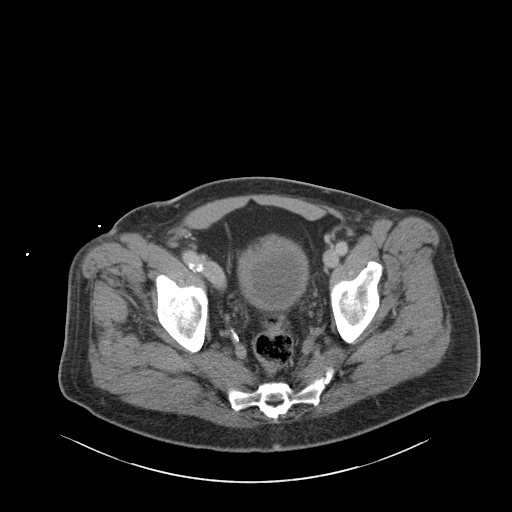
[im 33/97  soft-tissue]
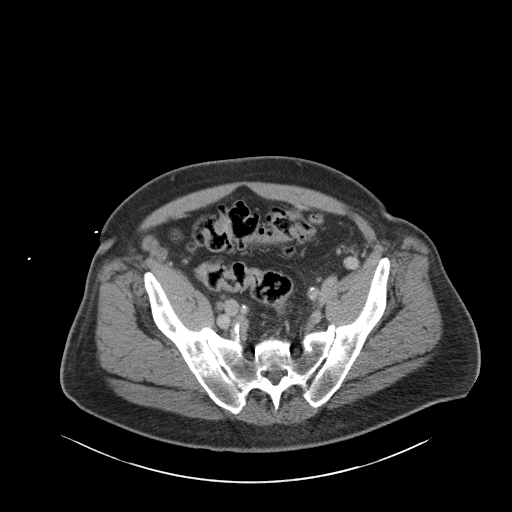
[im 39/97  soft-tissue]
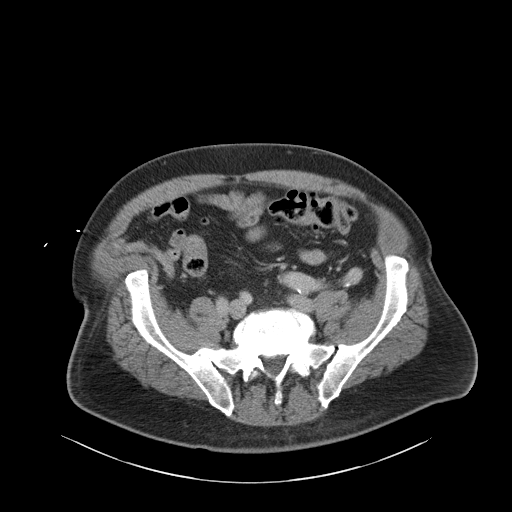
[im 45/97  soft-tissue]
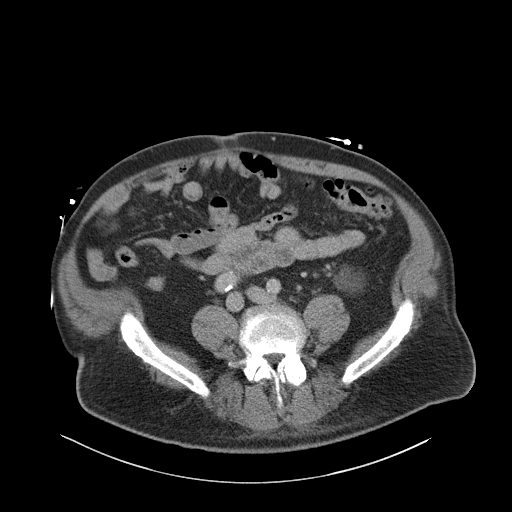
[im 52/97  soft-tissue]
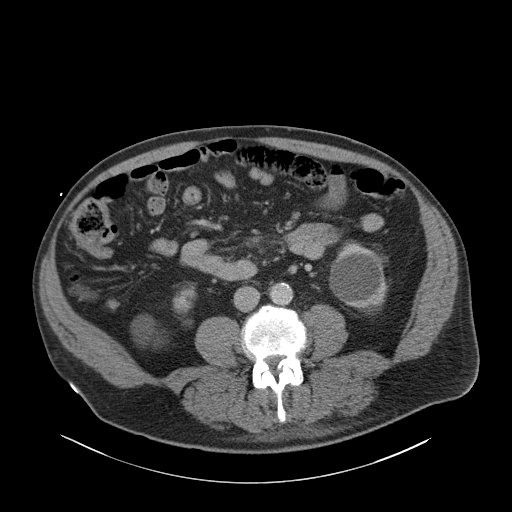
[im 58/97  soft-tissue]
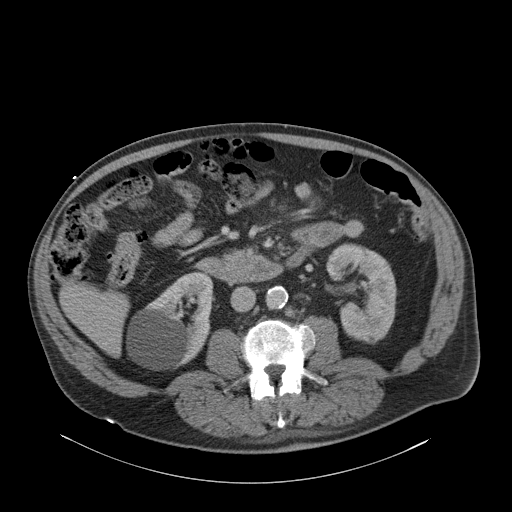
[im 58/97  bone]
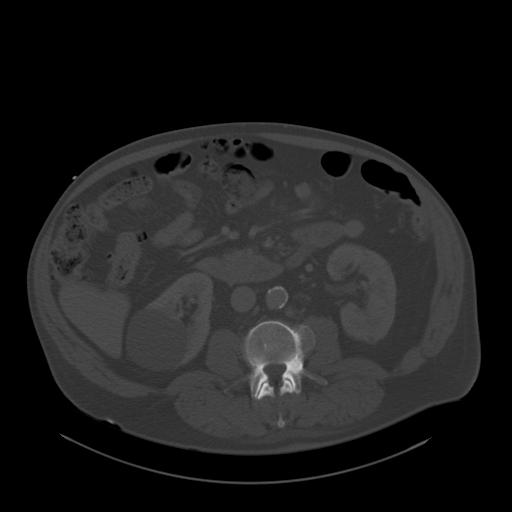
[im 65/97  soft-tissue]
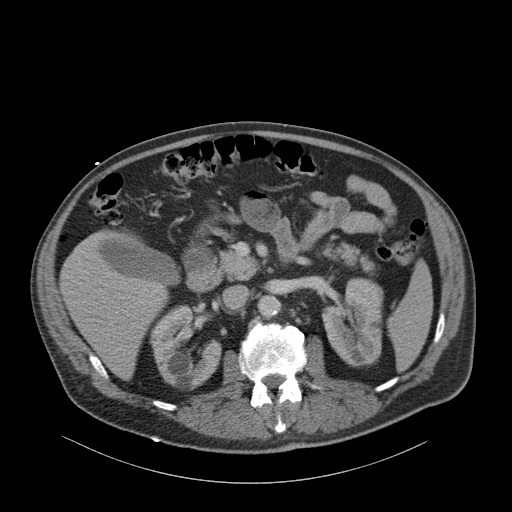
[im 71/97  soft-tissue]
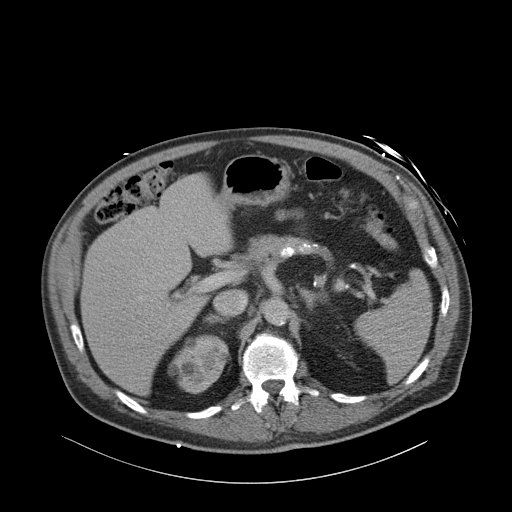
[im 77/97  soft-tissue]
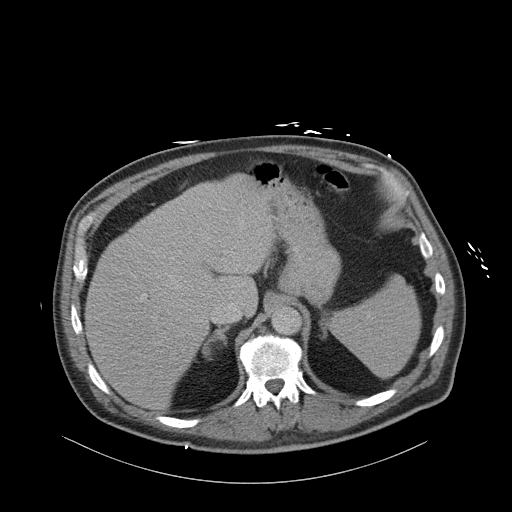
[im 84/97  soft-tissue]
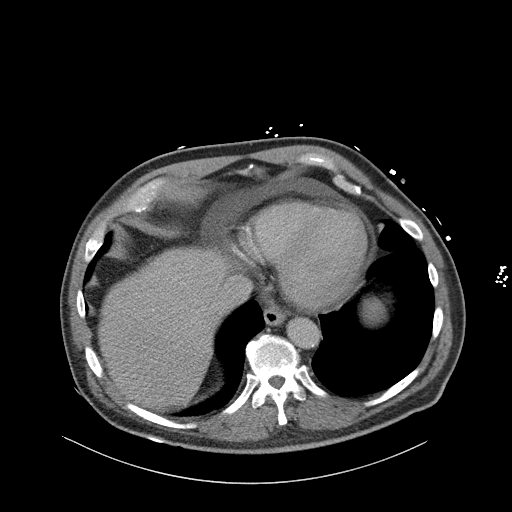
[im 90/97  soft-tissue]
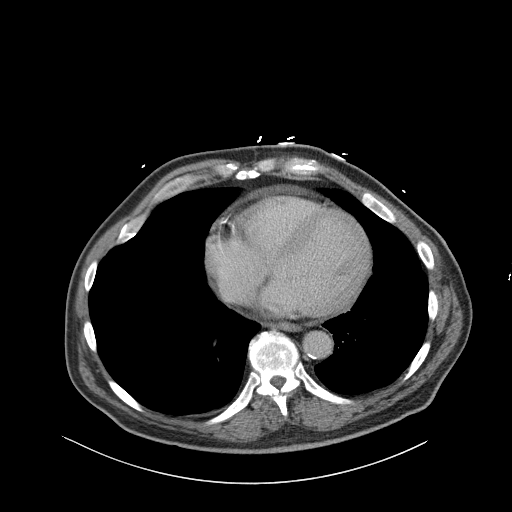

[Series 4: coronal st · coronal · 0.87mm/px · 3 of 163 slices shown]
[im 55/163  soft-tissue]
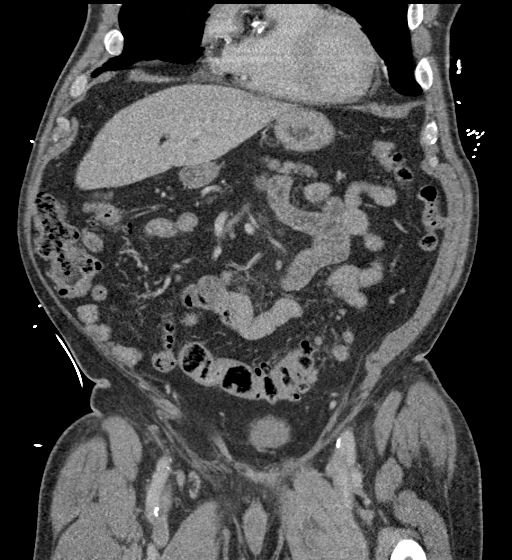
[im 73/163  soft-tissue]
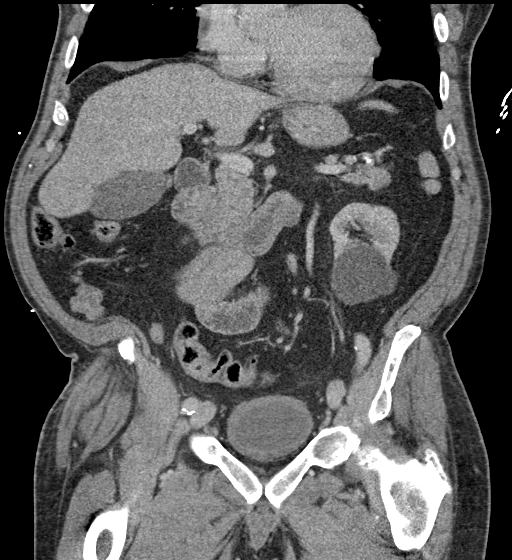
[im 91/163  soft-tissue]
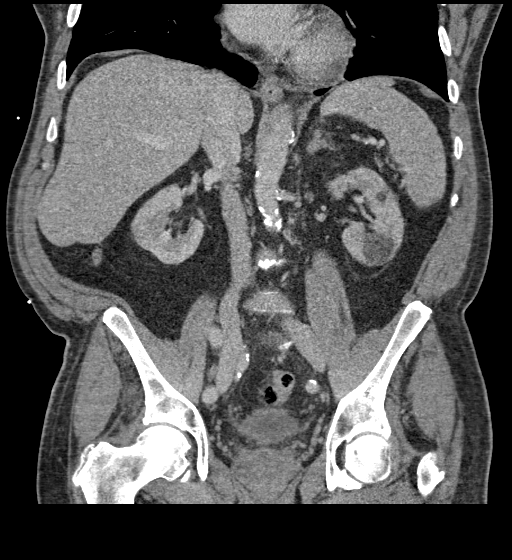

[17 of 46 positions shown; findings below may reference images not displayed]

FINDINGS: Lower chest: No acute pleural or parenchymal lung disease. Trace
pericardial effusion.

Hepatobiliary: No focal liver abnormality is seen. No gallstones,
gallbladder wall thickening, or biliary dilatation.

Pancreas: Unremarkable. No pancreatic ductal dilatation or
surrounding inflammatory changes.

Spleen: Normal in size without focal abnormality.

Adrenals/Urinary Tract: 2.4 cm right adrenal myelolipoma. Left
adrenal is unremarkable.

Numerous bilateral renal hypodensities are identified compatible
with multiple cysts. No urinary tract calculi or obstructive
uropathy. Bladder is minimally distended with no focal
abnormalities.

Stomach/Bowel: There is diffuse diverticulosis of the colon with no
evidence of acute diverticulitis. Normal retrocecal appendix. No
bowel wall thickening or inflammatory change.

Vascular/Lymphatic: Aortic atherosclerosis. No enlarged abdominal or
pelvic lymph nodes.

Reproductive: Prostate is unremarkable.

Other: No free fluid or free gas. Small fat containing umbilical
hernia. No bowel herniation.

Musculoskeletal: No acute or destructive bony lesions. Reconstructed
images demonstrate no additional findings.
IMPRESSION: 1. Diffuse colonic diverticulosis without diverticulitis.
2. Trace pericardial effusion.
3. Benign right adrenal myelolipoma.
4. Aortic Atherosclerosis ([id number]-U20.0).

## 2021-03-12 DIAGNOSIS — Z23 Encounter for immunization: Secondary | ICD-10-CM | POA: Diagnosis not present

## 2021-03-14 ENCOUNTER — Other Ambulatory Visit: Payer: Self-pay | Admitting: Family Medicine

## 2021-03-14 DIAGNOSIS — E785 Hyperlipidemia, unspecified: Secondary | ICD-10-CM

## 2021-03-18 DIAGNOSIS — M9903 Segmental and somatic dysfunction of lumbar region: Secondary | ICD-10-CM | POA: Diagnosis not present

## 2021-03-18 DIAGNOSIS — M9901 Segmental and somatic dysfunction of cervical region: Secondary | ICD-10-CM | POA: Diagnosis not present

## 2021-03-18 DIAGNOSIS — M4723 Other spondylosis with radiculopathy, cervicothoracic region: Secondary | ICD-10-CM | POA: Diagnosis not present

## 2021-03-18 DIAGNOSIS — M47812 Spondylosis without myelopathy or radiculopathy, cervical region: Secondary | ICD-10-CM | POA: Diagnosis not present

## 2021-03-18 DIAGNOSIS — M9902 Segmental and somatic dysfunction of thoracic region: Secondary | ICD-10-CM | POA: Diagnosis not present

## 2021-03-18 DIAGNOSIS — M47896 Other spondylosis, lumbar region: Secondary | ICD-10-CM | POA: Diagnosis not present

## 2021-03-22 ENCOUNTER — Ambulatory Visit (INDEPENDENT_AMBULATORY_CARE_PROVIDER_SITE_OTHER): Payer: Medicare Other

## 2021-03-22 VITALS — Ht 70.5 in | Wt 235.0 lb

## 2021-03-22 DIAGNOSIS — Z0000 Encounter for general adult medical examination without abnormal findings: Secondary | ICD-10-CM | POA: Diagnosis not present

## 2021-03-22 NOTE — Progress Notes (Signed)
I connected with [name] [name] today by telephone and verified that I am speaking with the correct person using two identifiers. Location patient: home Location provider: work Persons participating in the virtual visit: Josef Tourigny, Glenna Durand LPN.   I discussed the limitations, risks, security and privacy concerns of performing an evaluation and management service by telephone and the availability of in person appointments. I also discussed with the patient that there may be a patient responsible charge related to this service. The patient expressed understanding and verbally consented to this telephonic visit.    Interactive audio and video telecommunications were attempted between this provider and patient, however failed, due to patient having technical difficulties OR patient did not have access to video capability.  We continued and completed visit with audio only.     Vital signs may be patient reported or missing.  Subjective:   [name] [name] is a 71 y.o. male who presents for Medicare Annual/Subsequent preventive examination.  Review of Systems     Cardiac Risk Factors include: advanced age (>44men, >46 women);diabetes mellitus;dyslipidemia;hypertension;male gender     Objective:    Today's Vitals   03/22/21 1111  Weight: 235 lb (106.6 kg)  Height: 5' 10.5" (1.791 m)   Body mass index is 33.24 kg/m.  Advanced Directives 03/22/2021 02/14/2020 11/01/2019 11/01/2019 09/30/2018 06/24/2016  Does Patient Have a Medical Advance Directive? Yes Yes Yes Yes Yes Yes  Type of Paramedic of [place];Living will Living will [place];Living will [place];Living will [place];Living will -  Does patient want to make changes to medical advance directive? - No - Patient declined No - Patient declined - Yes (MAU/Ambulatory/Procedural Areas - Information given) Yes (Inpatient - patient defers changing a medical  advance directive at this time)  Copy of [place] in Chart? No - copy requested - No - copy requested No - copy requested No - copy requested -  Would patient like information on creating a medical advance directive? - - No - Patient declined - - -    Current Medications (verified) Outpatient Encounter Medications as of 03/22/2021  Medication Sig   Accu-Chek FastClix Lancets MISC 1 each by Does not apply route daily.   amLODipine (NORVASC) 10 MG tablet TAKE 1 TABLET BY MOUTH EVERY DAY   aspirin EC 81 MG tablet Take 81 mg by mouth daily. Swallow whole.   EPINEPHrine (EPI-PEN) 0.3 mg/0.3 mL DEVI Inject 0.3 mLs (0.3 mg total) into the muscle once.   fish oil-omega-3 fatty acids 1000 MG capsule Take 2 g by mouth daily.   Glucosamine-Chondroit-Vit C-Mn (GLUCOSAMINE 1500 COMPLEX) CAPS Take 1 capsule by mouth daily.    glucose blood (ACCU-CHEK GUIDE) test strip Use one daily to check blood sugar   Multiple Vitamins-Minerals (MULTIVITAMIN WITH MINERALS) tablet Take 1 tablet by mouth daily.   sildenafil (REVATIO) 20 MG tablet Take 1 to 5 pills as needed daily (Patient taking differently: Take 20-100 mg by mouth daily as needed (erectile dysfunction).)   simvastatin (ZOCOR) 40 MG tablet TAKE 1 TABLET BY MOUTH EVERYDAY AT BEDTIME   Testosterone 20.25 MG/ACT (1.62%) GEL APPLY 3 PUMPS TO SHOULDER AND ARM EVERY DAY   valsartan-hydrochlorothiazide (DIOVAN-HCT) 320-12.5 MG tablet TAKE 1 TABLET BY MOUTH EVERY DAY   aspirin 325 MG tablet Take 325 mg by mouth daily.   (Patient not taking: Reported on 11/07/2019)   No facility-administered encounter medications on file as of 03/22/2021.  Allergies (verified) Yellow jacket venom   History: Past Medical History:  Diagnosis Date   Dyslipidemia    ED (erectile dysfunction)    Hyperlipidemia    Hyperparathyroidism    Hypertension    Hypogonadism, male    Sleep apnea    Past Surgical History:  Procedure Laterality Date    PARATHYROIDECTOMY     SKIN BIOPSY Left 03/02/2019   squamous cell carcinoma in situ, hypertrophic, traumatized   Family History  Problem Relation Age of Onset   Hypertension Father    Hyperlipidemia Father    Social History   Socioeconomic History   Marital status: Married    Spouse name: Not on file   Number of children: Not on file   Years of education: Not on file   Highest education level: Not on file  Occupational History   Not on file  Tobacco Use   Smoking status: Never   Smokeless tobacco: Never  Vaping Use   Vaping Use: Never used  Substance and Sexual Activity   Alcohol use: Yes    Alcohol/week: 14.0 standard drinks    Types: 14 Cans of beer per week   Drug use: No   Sexual activity: Yes  Other Topics Concern   Not on file  Social History Narrative   Not on file   Social Determinants of Health   Financial Resource Strain: Low Risk    Difficulty of Paying Living Expenses: Not hard at all  Food Insecurity: No Food Insecurity   Worried About Charity fundraiser in the Last Year: Never true   [place] in the Last Year: Never true  Transportation Needs: No Transportation Needs   Lack of Transportation (Medical): No   Lack of Transportation (Non-Medical): No  Physical Activity: Insufficiently Active   Days of Exercise per Week: 3 days   Minutes of Exercise per Session: 30 min  Stress: No Stress Concern Present   Feeling of Stress : Not at all  Social Connections: Not on file    Tobacco Counseling Counseling given: Not Answered   Clinical Intake:  Pre-visit preparation completed: Yes  Pain : No/denies pain     Nutritional Status: BMI > 30  Obese Nutritional Risks: None Diabetes: Yes  How often do you need to have someone help you when you read instructions, pamphlets, or other written materials from your doctor or pharmacy?: 1 - Never What is the last grade level you completed in school?: MBA  Diabetic? Yes Nutrition Risk  Assessment:  Has the patient had any N/V/D within the last 2 months?  No  Does the patient have any non-healing wounds?  No  Has the patient had any unintentional weight loss or weight gain?  No   Diabetes:  Is the patient diabetic?  Yes  If diabetic, was a CBG obtained today?  No  Did the patient bring in their glucometer from home?  No  How often do you monitor your CBG's? Not currently.   Financial Strains and Diabetes Management:  Are you having any financial strains with the device, your supplies or your medication? No .  Does the patient want to be seen by Chronic Care Management for management of their diabetes?  No  Would the patient like to be referred to a Nutritionist or for Diabetic Management?  No   Diabetic Exams:  Diabetic Eye Exam: Completed 02/05/2021 Diabetic Foot Exam: Completed 06/12/2020   Interpreter Needed?: No  Information entered by :: NAllen LPN  Activities of Daily Living In your present state of health, do you have any difficulty performing the following activities: 03/22/2021  Hearing? N  Vision? N  Difficulty concentrating or making decisions? N  Walking or climbing stairs? N  Dressing or bathing? N  Doing errands, shopping? N  Preparing Food and eating ? N  Using the Toilet? N  In the past six months, have you accidently leaked urine? N  Do you have problems with loss of bowel control? N  Managing your Medications? N  Managing your Finances? N  Housekeeping or managing your Housekeeping? N  Some recent data might be hidden    Patient Care Team: Denita Lung, MD as PCP - General (Family Medicine)  Indicate any recent Medical Services you may have received from other than [facility] providers in the past year (date may be approximate).     Assessment:   This is a routine wellness examination for [name] [name].  Hearing/Vision screen Vision Screening - Comments:: Regular eye exams, [facility]  Dietary issues and exercise activities  discussed: Current Exercise Habits: Home exercise routine, Type of exercise: treadmill, Time (Minutes): 30, Frequency (Times/Week): 3, Weekly Exercise (Minutes/Week): 90   Goals Addressed             This Visit's Progress    Patient Stated       03/22/2021, wants to get to previous physical activity and lose 10 pounds       Depression Screen PHQ 2/9 Scores 03/22/2021 06/12/2020 02/14/2020 10/06/2019 09/30/2018 01/14/2018 09/03/2017  PHQ - 2 Score 0 3 0 0 0 0 0    Fall Risk Fall Risk  03/22/2021 02/14/2020 10/06/2019 09/30/2018 01/14/2018  Falls in the past year? 0 0 0 0 No  Comment - - - - -  Risk for fall due to : Medication side effect - - - -  Follow up Falls evaluation completed;Education provided;Falls prevention discussed - - - -    FALL RISK PREVENTION PERTAINING TO THE HOME:  Any stairs in or around the home? Yes  If so, are there any without handrails? No  Home free of loose throw rugs in walkways, pet beds, electrical cords, etc? Yes  Adequate lighting in your home to reduce risk of falls? Yes   ASSISTIVE DEVICES UTILIZED TO PREVENT FALLS:  Life alert? No  Use of a cane, walker or w/c? No  Grab bars in the bathroom? No  Shower chair or bench in shower? Yes  Elevated toilet seat or a handicapped toilet? No   TIMED UP AND GO:  Was the test performed? No .      Cognitive Function:     6CIT Screen 03/22/2021  What Year? 0 points  What month? 0 points  What time? 0 points  Count back from 20 0 points  Months in reverse 0 points  Repeat phrase 0 points  Total Score 0    Immunizations Immunization History  Administered Date(s) Administered   Fluad Quad(high Dose 65+) 11/28/2019   Hepatitis A 04/28/2007, 05/31/2007   Hepatitis B 04/28/2007, 05/31/2007, 01/08/2010   Influenza Split 11/16/2011, 12/14/2012   Influenza Whole 12/19/2008   Influenza, High Dose Seasonal PF 11/12/2015, 12/30/2016, 12/29/2017, 11/18/2018   Influenza,inj,Quad PF,6+ Mos 12/29/2013,  11/07/2014   Influenza,inj,quad, With Preservative 12/11/2016   Influenza-Unspecified 01/07/2021   Moderna SARS-COV2 Booster Vaccination 10/16/2020   Moderna Sars-Covid-2 Vaccination 04/19/2019, 05/19/2019, 01/31/2020   Pfizer Covid-19 Vaccine Bivalent Booster 3yrs & up 03/12/2021   Pneumococcal Conjugate-13 11/12/2015  Pneumococcal Polysaccharide-23 01/17/2011   Tdap 04/28/2007, 10/05/2017   Zoster Recombinat (Shingrix) 07/11/2016, 09/29/2016   Zoster, Live 01/17/2011    TDAP status: Up to date  Flu Vaccine status: Up to date  Pneumococcal vaccine status: Up to date  Covid-19 vaccine status: Completed vaccines  Qualifies for Shingles Vaccine? Yes   Zostavax completed Yes   Shingrix Completed?: Yes  Screening Tests Health Maintenance  Topic Date Due   Pneumonia Vaccine 59+ Years old (3 - PPSV23 if available, else PCV20) 11/11/2016   HEMOGLOBIN A1C  04/18/2021   FOOT EXAM  06/12/2021   OPHTHALMOLOGY EXAM  02/05/2022   COLONOSCOPY (Pts 45-42yrs Insurance coverage will need to be confirmed)  03/13/2025   TETANUS/TDAP  10/06/2027   INFLUENZA VACCINE  Completed   COVID-19 Vaccine  Completed   Hepatitis C Screening  Completed   Zoster Vaccines- Shingrix  Completed   HPV VACCINES  Aged Out    Health Maintenance  Health Maintenance Due  Topic Date Due   Pneumonia Vaccine 80+ Years old (3 - PPSV23 if available, else PCV20) 11/11/2016    Colorectal cancer screening: Type of screening: Colonoscopy. Completed 03/14/2015. Repeat every 10 years  Lung Cancer Screening: (Low Dose CT Chest recommended if Age 71-80 years, 30 pack-year currently smoking OR have quit w/in 15years.) does not qualify.   Lung Cancer Screening Referral: no  Additional Screening:  Hepatitis C Screening: does qualify; Completed 04/19/2015  Vision Screening: Recommended annual ophthalmology exams for early detection of glaucoma and other disorders of the eye. Is the patient up to date with their  annual eye exam?  Yes  Who is the provider or what is the name of the office in which the patient attends annual eye exams? [facility] If pt is not established with a provider, would they like to be referred to a provider to establish care? No .   Dental Screening: Recommended annual dental exams for proper oral hygiene  Community Resource Referral / Chronic Care Management: CRR required this visit?  No   CCM required this visit?  No      Plan:     I have personally reviewed and noted the following in the patients chart:   Medical and social history Use of alcohol, tobacco or illicit drugs  Current medications and supplements including opioid prescriptions. Patient is not currently taking opioid prescriptions. Functional ability and status Nutritional status Physical activity Advanced directives List of other physicians Hospitalizations, surgeries, and ER visits in previous 12 months Vitals Screenings to include cognitive, depression, and falls Referrals and appointments  In addition, I have reviewed and discussed with patient certain preventive protocols, quality metrics, and best practice recommendations. A written personalized care plan for preventive services as well as general preventive health recommendations were provided to patient.     Kellie Simmering, LPN   06/21/8030   Nurse Notes: none

## 2021-03-22 NOTE — Patient Instructions (Signed)
[name] [name] , Thank you for taking time to come for your Medicare Wellness Visit. I appreciate your ongoing commitment to your health goals. Please review the following plan we discussed and let me know if I can assist you in the future.   Screening recommendations/referrals: Colonoscopy: completed 03/21/2015 Recommended yearly ophthalmology/optometry visit for glaucoma screening and checkup Recommended yearly dental visit for hygiene and checkup  Vaccinations: Influenza vaccine: completed 01/07/2021 Pneumococcal vaccine: completed 11/12/2015 Tdap vaccine: completed 10/05/2017, due 10/06/2027 Shingles vaccine: completed   Covid-19:  03/12/2021, 10/16/2020, 01/31/2020, 05/19/2019, 04/19/2019  Advanced directives: Please bring a copy of your POA (Power of Attorney) and/or Living Will to your next appointment.   Conditions/risks identified: none  Next appointment: Follow up in one year for your annual wellness visit.   Preventive Care 39 Years and Older, Male Preventive care refers to lifestyle choices and visits with your health care provider that can promote health and wellness. What does preventive care include? A yearly physical exam. This is also called an annual well check. Dental exams once or twice a year. Routine eye exams. Ask your health care provider how often you should have your eyes checked. Personal lifestyle choices, including: Daily care of your teeth and gums. Regular physical activity. Eating a healthy diet. Avoiding tobacco and drug use. Limiting alcohol use. Practicing safe sex. Taking low doses of aspirin every day. Taking vitamin and mineral supplements as recommended by your health care provider. What happens during an annual well check? The services and screenings done by your health care provider during your annual well check will depend on your age, overall health, lifestyle risk factors, and family history of disease. Counseling  Your health care provider may ask  you questions about your: Alcohol use. Tobacco use. Drug use. Emotional well-being. Home and relationship well-being. Sexual activity. Eating habits. History of falls. Memory and ability to understand (cognition). Work and work Statistician. Screening  You may have the following tests or measurements: Height, weight, and BMI. Blood pressure. Lipid and cholesterol levels. These may be checked every 5 years, or more frequently if you are over 33 years old. Skin check. Lung cancer screening. You may have this screening every year starting at age 71 if you have a 30-pack-year history of smoking and currently smoke or have quit within the past 15 years. Fecal occult blood test (FOBT) of the stool. You may have this test every year starting at age 71. Flexible sigmoidoscopy or colonoscopy. You may have a sigmoidoscopy every 5 years or a colonoscopy every 10 years starting at age 71. Prostate cancer screening. Recommendations will vary depending on your family history and other risks. Hepatitis C blood test. Hepatitis B blood test. Sexually transmitted disease (STD) testing. Diabetes screening. This is done by checking your blood sugar (glucose) after you have not eaten for a while (fasting). You may have this done every 1-3 years. Abdominal aortic aneurysm (AAA) screening. You may need this if you are a current or former smoker. Osteoporosis. You may be screened starting at age 38 if you are at high risk. Talk with your health care provider about your test results, treatment options, and if necessary, the need for more tests. Vaccines  Your health care provider may recommend certain vaccines, such as: Influenza vaccine. This is recommended every year. Tetanus, diphtheria, and acellular pertussis (Tdap, Td) vaccine. You may need a Td booster every 10 years. Zoster vaccine. You may need this after age 71. Pneumococcal 13-valent conjugate (PCV13) vaccine. One dose  is recommended after age  71. Pneumococcal polysaccharide (PPSV23) vaccine. One dose is recommended after age 71. Talk to your health care provider about which screenings and vaccines you need and how often you need them. This information is not intended to replace advice given to you by your health care provider. Make sure you discuss any questions you have with your health care provider. Document Released: 03/30/2015 Document Revised: 11/21/2015 Document Reviewed: 01/02/2015 Elsevier Interactive Patient Education  2017 [place] Prevention in the Home Falls can cause injuries. They can happen to people of all ages. There are many things you can do to make your home safe and to help prevent falls. What can I do on the outside of my home? Regularly fix the edges of walkways and driveways and fix any cracks. Remove anything that might make you trip as you walk through a door, such as a raised step or threshold. Trim any bushes or trees on the path to your home. Use bright outdoor lighting. Clear any walking paths of anything that might make someone trip, such as rocks or tools. Regularly check to see if handrails are loose or broken. Make sure that both sides of any steps have handrails. Any raised decks and porches should have guardrails on the edges. Have any leaves, snow, or ice cleared regularly. Use sand or salt on walking paths during winter. Clean up any spills in your garage right away. This includes oil or grease spills. What can I do in the bathroom? Use night lights. Install grab bars by the toilet and in the tub and shower. Do not use towel bars as grab bars. Use non-skid mats or decals in the tub or shower. If you need to sit down in the shower, use a plastic, non-slip stool. Keep the floor dry. Clean up any water that spills on the floor as soon as it happens. Remove soap buildup in the tub or shower regularly. Attach bath mats securely with double-sided non-slip rug tape. Do not have throw  rugs and other things on the floor that can make you trip. What can I do in the bedroom? Use night lights. Make sure that you have a light by your bed that is easy to reach. Do not use any sheets or blankets that are too big for your bed. They should not hang down onto the floor. Have a firm chair that has side arms. You can use this for support while you get dressed. Do not have throw rugs and other things on the floor that can make you trip. What can I do in the kitchen? Clean up any spills right away. Avoid walking on wet floors. Keep items that you use a lot in easy-to-reach places. If you need to reach something above you, use a strong step stool that has a grab bar. Keep electrical cords out of the way. Do not use floor polish or wax that makes floors slippery. If you must use wax, use non-skid floor wax. Do not have throw rugs and other things on the floor that can make you trip. What can I do with my stairs? Do not leave any items on the stairs. Make sure that there are handrails on both sides of the stairs and use them. Fix handrails that are broken or loose. Make sure that handrails are as long as the stairways. Check any carpeting to make sure that it is firmly attached to the stairs. Fix any carpet that is loose or worn. Avoid  having throw rugs at the top or bottom of the stairs. If you do have throw rugs, attach them to the floor with carpet tape. Make sure that you have a light switch at the top of the stairs and the bottom of the stairs. If you do not have them, ask someone to add them for you. What else can I do to help prevent falls? Wear shoes that: Do not have high heels. Have rubber bottoms. Are comfortable and fit you well. Are closed at the toe. Do not wear sandals. If you use a stepladder: Make sure that it is fully opened. Do not climb a closed stepladder. Make sure that both sides of the stepladder are locked into place. Ask someone to hold it for you, if  possible. Clearly mark and make sure that you can see: Any grab bars or handrails. First and last steps. Where the edge of each step is. Use tools that help you move around (mobility aids) if they are needed. These include: Canes. Walkers. Scooters. Crutches. Turn on the lights when you go into a dark area. Replace any light bulbs as soon as they burn out. Set up your furniture so you have a clear path. Avoid moving your furniture around. If any of your floors are uneven, fix them. If there are any pets around you, be aware of where they are. Review your medicines with your doctor. Some medicines can make you feel dizzy. This can increase your chance of falling. Ask your doctor what other things that you can do to help prevent falls. This information is not intended to replace advice given to you by your health care provider. Make sure you discuss any questions you have with your health care provider. Document Released: 12/28/2008 Document Revised: 08/09/2015 Document Reviewed: 04/07/2014 Elsevier Interactive Patient Education  2017 [employer].

## 2021-04-10 DIAGNOSIS — M4723 Other spondylosis with radiculopathy, cervicothoracic region: Secondary | ICD-10-CM | POA: Diagnosis not present

## 2021-04-10 DIAGNOSIS — M9901 Segmental and somatic dysfunction of cervical region: Secondary | ICD-10-CM | POA: Diagnosis not present

## 2021-04-10 DIAGNOSIS — M47896 Other spondylosis, lumbar region: Secondary | ICD-10-CM | POA: Diagnosis not present

## 2021-04-10 DIAGNOSIS — M9903 Segmental and somatic dysfunction of lumbar region: Secondary | ICD-10-CM | POA: Diagnosis not present

## 2021-04-10 DIAGNOSIS — M9902 Segmental and somatic dysfunction of thoracic region: Secondary | ICD-10-CM | POA: Diagnosis not present

## 2021-04-10 DIAGNOSIS — M47812 Spondylosis without myelopathy or radiculopathy, cervical region: Secondary | ICD-10-CM | POA: Diagnosis not present

## 2021-05-15 DIAGNOSIS — M9903 Segmental and somatic dysfunction of lumbar region: Secondary | ICD-10-CM | POA: Diagnosis not present

## 2021-05-15 DIAGNOSIS — M47816 Spondylosis without myelopathy or radiculopathy, lumbar region: Secondary | ICD-10-CM | POA: Diagnosis not present

## 2021-05-15 DIAGNOSIS — M5388 Other specified dorsopathies, sacral and sacrococcygeal region: Secondary | ICD-10-CM | POA: Diagnosis not present

## 2021-05-15 DIAGNOSIS — M9904 Segmental and somatic dysfunction of sacral region: Secondary | ICD-10-CM | POA: Diagnosis not present

## 2021-05-15 DIAGNOSIS — M47892 Other spondylosis, cervical region: Secondary | ICD-10-CM | POA: Diagnosis not present

## 2021-05-15 DIAGNOSIS — M9901 Segmental and somatic dysfunction of cervical region: Secondary | ICD-10-CM | POA: Diagnosis not present

## 2021-06-12 ENCOUNTER — Other Ambulatory Visit: Payer: Self-pay

## 2021-06-12 ENCOUNTER — Encounter: Payer: Self-pay | Admitting: Family Medicine

## 2021-06-12 ENCOUNTER — Ambulatory Visit (INDEPENDENT_AMBULATORY_CARE_PROVIDER_SITE_OTHER): Payer: Medicare Other | Admitting: Family Medicine

## 2021-06-12 ENCOUNTER — Other Ambulatory Visit: Payer: Self-pay | Admitting: Family Medicine

## 2021-06-12 VITALS — BP 140/76 | HR 64 | Temp 98.2°F | Wt 238.8 lb

## 2021-06-12 DIAGNOSIS — E669 Obesity, unspecified: Secondary | ICD-10-CM

## 2021-06-12 DIAGNOSIS — E785 Hyperlipidemia, unspecified: Secondary | ICD-10-CM

## 2021-06-12 DIAGNOSIS — E119 Type 2 diabetes mellitus without complications: Secondary | ICD-10-CM

## 2021-06-12 DIAGNOSIS — I152 Hypertension secondary to endocrine disorders: Secondary | ICD-10-CM

## 2021-06-12 DIAGNOSIS — E1169 Type 2 diabetes mellitus with other specified complication: Secondary | ICD-10-CM | POA: Diagnosis not present

## 2021-06-12 DIAGNOSIS — E1159 Type 2 diabetes mellitus with other circulatory complications: Secondary | ICD-10-CM

## 2021-06-12 LAB — POCT GLYCOSYLATED HEMOGLOBIN (HGB A1C): Hemoglobin A1C: 6.5 % — AB (ref 4.0–5.6)

## 2021-06-12 NOTE — Progress Notes (Signed)
?Subjective:  ? ? Patient ID: [name] [name], male    DOB: April 08, 1950, 71 y.o.   MRN: [id number] ? ?[name] [name] is a 71 y.o. male who presents for follow-up of Type 2 diabetes mellitus. ? ?Home blood sugar records:  post meal lowest 133 highest 253 ?Current symptoms/problems include none and have been unchanged. ?Daily foot checks: yes  Any foot concerns: none  ?Exercise:  staying active ?Diet: good he actually has been checking his blood sugars after meals and is starting to get a good feel for certain types of foods causing trouble.  He mentions [place] and [place].  He continues on simvastatin, amlodipine, valsartan.  He has had difficulty with low back pain and did have steroids on board and he noticed that it really did have an effect on his blood sugars. ?The following portions of the patient's history were reviewed and updated as appropriate: allergies, current medications, past medical history, past social history and problem list. ? ?ROS as in subjective above. ? ?   ?Objective:  ?  ?Physical Exam ?Alert and in no distress otherwise not examined. ? ?Hemoglobin A1c is 6.5 ?Lab Review ? ?  Latest Ref Rng & Units 10/16/2020  ? 10:56 AM 06/12/2020  ? 12:42 PM 02/14/2020  ? 11:39 AM 02/14/2020  ? 11:10 AM 11/03/2019  ?  6:11 AM  ?Diabetic Labs  ?HbA1c 4.0 - 5.6 % 7.0   6.8   6.7      ?Chol 100 - 199 mg/dL    211     ?HDL >39 mg/dL    49     ?Calc LDL 0 - 99 mg/dL    140     ?Triglycerides 0 - 149 mg/dL    125     ?Creatinine 0.61 - 1.24 mg/dL     0.76    ? ? ?  03/22/2021  ? 11:11 AM 10/16/2020  ? 10:05 AM 06/12/2020  ? 11:34 AM 02/14/2020  ? 10:20 AM 11/07/2019  ?  2:23 PM  ?BP/Weight  ?Systolic BP  448 185 631 497  ?Diastolic BP  82 90 80 76  ?Wt. (Lbs) 235 243.4 239.8 233.8 227.6  ?BMI 33.24 kg/m2 34.43 kg/m2 34.9 kg/m2 34.03 kg/m2 31.74 kg/m2  ? ? ?  Latest Ref Rng & Units 02/05/2021  ? 12:00 AM 06/12/2020  ? 11:00 AM  ?Foot/eye exam completion dates  ?Eye Exam No Retinopathy No Retinopathy        ?Foot Form Completion    Done  ?  ? This result is from an external source.  ? ? ?Lorn  reports that he has never smoked. He has never used smokeless tobacco. He reports current alcohol use of about 14.0 standard drinks per week. He reports that he does not use drugs. ? ?   ?Assessment & Plan:  ?  ? ?Type 2 diabetes mellitus without complication, without long-term current use of insulin ([place]) - Plan: POCT glycosylated hemoglobin (Hb A1C) ? ?Hypertension associated with diabetes ([place]) ? ?Hyperlipidemia associated with type 2 diabetes mellitus ([place]) ? ?Obesity (BMI 30-39.9) ? ?I congratulated him on the good work that he is done concerning checking his blood sugars.  Discussed protein fat and carbohydrates and to look at the various meals to determine how much of the blood sugars was related to extra carbohydrates in those meals rather than looking at him in terms of ethnic origin.  Discussed protein, fat and carbohydrates with him as well as eating more frequent but  smaller meals.  He will return here for complete exam in several months. ? ? ? ?  ?

## 2021-06-19 DIAGNOSIS — M9901 Segmental and somatic dysfunction of cervical region: Secondary | ICD-10-CM | POA: Diagnosis not present

## 2021-06-19 DIAGNOSIS — M47892 Other spondylosis, cervical region: Secondary | ICD-10-CM | POA: Diagnosis not present

## 2021-06-19 DIAGNOSIS — M9903 Segmental and somatic dysfunction of lumbar region: Secondary | ICD-10-CM | POA: Diagnosis not present

## 2021-06-19 DIAGNOSIS — M5388 Other specified dorsopathies, sacral and sacrococcygeal region: Secondary | ICD-10-CM | POA: Diagnosis not present

## 2021-06-19 DIAGNOSIS — M47816 Spondylosis without myelopathy or radiculopathy, lumbar region: Secondary | ICD-10-CM | POA: Diagnosis not present

## 2021-06-19 DIAGNOSIS — M9904 Segmental and somatic dysfunction of sacral region: Secondary | ICD-10-CM | POA: Diagnosis not present

## 2021-06-22 ENCOUNTER — Other Ambulatory Visit: Payer: Self-pay | Admitting: Family Medicine

## 2021-06-22 DIAGNOSIS — E291 Testicular hypofunction: Secondary | ICD-10-CM

## 2021-06-24 NOTE — Telephone Encounter (Signed)
[facility] is requesting to fill pt testosterone . Please advise [place] ?

## 2021-07-02 DIAGNOSIS — M47892 Other spondylosis, cervical region: Secondary | ICD-10-CM | POA: Diagnosis not present

## 2021-07-02 DIAGNOSIS — M9904 Segmental and somatic dysfunction of sacral region: Secondary | ICD-10-CM | POA: Diagnosis not present

## 2021-07-02 DIAGNOSIS — M9901 Segmental and somatic dysfunction of cervical region: Secondary | ICD-10-CM | POA: Diagnosis not present

## 2021-07-02 DIAGNOSIS — M5388 Other specified dorsopathies, sacral and sacrococcygeal region: Secondary | ICD-10-CM | POA: Diagnosis not present

## 2021-07-02 DIAGNOSIS — M47816 Spondylosis without myelopathy or radiculopathy, lumbar region: Secondary | ICD-10-CM | POA: Diagnosis not present

## 2021-07-02 DIAGNOSIS — M9903 Segmental and somatic dysfunction of lumbar region: Secondary | ICD-10-CM | POA: Diagnosis not present

## 2021-07-03 ENCOUNTER — Other Ambulatory Visit: Payer: Self-pay | Admitting: Family Medicine

## 2021-07-03 DIAGNOSIS — E785 Hyperlipidemia, unspecified: Secondary | ICD-10-CM

## 2021-07-09 DIAGNOSIS — D225 Melanocytic nevi of trunk: Secondary | ICD-10-CM | POA: Diagnosis not present

## 2021-07-09 DIAGNOSIS — L814 Other melanin hyperpigmentation: Secondary | ICD-10-CM | POA: Diagnosis not present

## 2021-07-09 DIAGNOSIS — D492 Neoplasm of unspecified behavior of bone, soft tissue, and skin: Secondary | ICD-10-CM | POA: Diagnosis not present

## 2021-07-09 DIAGNOSIS — C44529 Squamous cell carcinoma of skin of other part of trunk: Secondary | ICD-10-CM | POA: Diagnosis not present

## 2021-07-09 DIAGNOSIS — L570 Actinic keratosis: Secondary | ICD-10-CM | POA: Diagnosis not present

## 2021-07-09 DIAGNOSIS — L821 Other seborrheic keratosis: Secondary | ICD-10-CM | POA: Diagnosis not present

## 2021-07-17 DIAGNOSIS — M47892 Other spondylosis, cervical region: Secondary | ICD-10-CM | POA: Diagnosis not present

## 2021-07-17 DIAGNOSIS — M9903 Segmental and somatic dysfunction of lumbar region: Secondary | ICD-10-CM | POA: Diagnosis not present

## 2021-07-17 DIAGNOSIS — M5388 Other specified dorsopathies, sacral and sacrococcygeal region: Secondary | ICD-10-CM | POA: Diagnosis not present

## 2021-07-17 DIAGNOSIS — M9901 Segmental and somatic dysfunction of cervical region: Secondary | ICD-10-CM | POA: Diagnosis not present

## 2021-07-17 DIAGNOSIS — M9904 Segmental and somatic dysfunction of sacral region: Secondary | ICD-10-CM | POA: Diagnosis not present

## 2021-07-17 DIAGNOSIS — M47897 Other spondylosis, lumbosacral region: Secondary | ICD-10-CM | POA: Diagnosis not present

## 2021-07-30 DIAGNOSIS — M5388 Other specified dorsopathies, sacral and sacrococcygeal region: Secondary | ICD-10-CM | POA: Diagnosis not present

## 2021-07-30 DIAGNOSIS — M47897 Other spondylosis, lumbosacral region: Secondary | ICD-10-CM | POA: Diagnosis not present

## 2021-07-30 DIAGNOSIS — M47892 Other spondylosis, cervical region: Secondary | ICD-10-CM | POA: Diagnosis not present

## 2021-07-30 DIAGNOSIS — M9901 Segmental and somatic dysfunction of cervical region: Secondary | ICD-10-CM | POA: Diagnosis not present

## 2021-07-30 DIAGNOSIS — M9903 Segmental and somatic dysfunction of lumbar region: Secondary | ICD-10-CM | POA: Diagnosis not present

## 2021-07-30 DIAGNOSIS — M9904 Segmental and somatic dysfunction of sacral region: Secondary | ICD-10-CM | POA: Diagnosis not present

## 2021-08-01 DIAGNOSIS — L570 Actinic keratosis: Secondary | ICD-10-CM | POA: Diagnosis not present

## 2021-08-01 DIAGNOSIS — D045 Carcinoma in situ of skin of trunk: Secondary | ICD-10-CM | POA: Diagnosis not present

## 2021-08-01 DIAGNOSIS — L989 Disorder of the skin and subcutaneous tissue, unspecified: Secondary | ICD-10-CM | POA: Diagnosis not present

## 2021-08-01 HISTORY — PX: SKIN BIOPSY: SHX1

## 2021-08-20 DIAGNOSIS — M9903 Segmental and somatic dysfunction of lumbar region: Secondary | ICD-10-CM | POA: Diagnosis not present

## 2021-08-20 DIAGNOSIS — M9901 Segmental and somatic dysfunction of cervical region: Secondary | ICD-10-CM | POA: Diagnosis not present

## 2021-08-20 DIAGNOSIS — M5388 Other specified dorsopathies, sacral and sacrococcygeal region: Secondary | ICD-10-CM | POA: Diagnosis not present

## 2021-08-20 DIAGNOSIS — M47892 Other spondylosis, cervical region: Secondary | ICD-10-CM | POA: Diagnosis not present

## 2021-08-20 DIAGNOSIS — M9904 Segmental and somatic dysfunction of sacral region: Secondary | ICD-10-CM | POA: Diagnosis not present

## 2021-08-20 DIAGNOSIS — M47897 Other spondylosis, lumbosacral region: Secondary | ICD-10-CM | POA: Diagnosis not present

## 2021-08-21 DIAGNOSIS — D045 Carcinoma in situ of skin of trunk: Secondary | ICD-10-CM | POA: Insufficient documentation

## 2021-08-23 ENCOUNTER — Encounter: Payer: Self-pay | Admitting: Family Medicine

## 2021-09-11 DIAGNOSIS — M47892 Other spondylosis, cervical region: Secondary | ICD-10-CM | POA: Diagnosis not present

## 2021-09-11 DIAGNOSIS — M9904 Segmental and somatic dysfunction of sacral region: Secondary | ICD-10-CM | POA: Diagnosis not present

## 2021-09-11 DIAGNOSIS — M5388 Other specified dorsopathies, sacral and sacrococcygeal region: Secondary | ICD-10-CM | POA: Diagnosis not present

## 2021-09-11 DIAGNOSIS — M9903 Segmental and somatic dysfunction of lumbar region: Secondary | ICD-10-CM | POA: Diagnosis not present

## 2021-09-11 DIAGNOSIS — M47897 Other spondylosis, lumbosacral region: Secondary | ICD-10-CM | POA: Diagnosis not present

## 2021-09-11 DIAGNOSIS — M9901 Segmental and somatic dysfunction of cervical region: Secondary | ICD-10-CM | POA: Diagnosis not present

## 2021-10-02 DIAGNOSIS — M9901 Segmental and somatic dysfunction of cervical region: Secondary | ICD-10-CM | POA: Diagnosis not present

## 2021-10-02 DIAGNOSIS — M47892 Other spondylosis, cervical region: Secondary | ICD-10-CM | POA: Diagnosis not present

## 2021-10-02 DIAGNOSIS — M47897 Other spondylosis, lumbosacral region: Secondary | ICD-10-CM | POA: Diagnosis not present

## 2021-10-02 DIAGNOSIS — M9903 Segmental and somatic dysfunction of lumbar region: Secondary | ICD-10-CM | POA: Diagnosis not present

## 2021-10-02 DIAGNOSIS — M9904 Segmental and somatic dysfunction of sacral region: Secondary | ICD-10-CM | POA: Diagnosis not present

## 2021-10-02 DIAGNOSIS — M5388 Other specified dorsopathies, sacral and sacrococcygeal region: Secondary | ICD-10-CM | POA: Diagnosis not present

## 2021-10-05 ENCOUNTER — Other Ambulatory Visit: Payer: Self-pay | Admitting: Family Medicine

## 2021-10-05 DIAGNOSIS — E785 Hyperlipidemia, unspecified: Secondary | ICD-10-CM

## 2021-10-09 ENCOUNTER — Encounter: Payer: Self-pay | Admitting: Family Medicine

## 2021-10-09 ENCOUNTER — Ambulatory Visit (INDEPENDENT_AMBULATORY_CARE_PROVIDER_SITE_OTHER): Payer: Medicare Other | Admitting: Family Medicine

## 2021-10-09 VITALS — BP 130/80 | HR 68 | Temp 97.7°F | Ht 69.25 in | Wt 234.4 lb

## 2021-10-09 DIAGNOSIS — I152 Hypertension secondary to endocrine disorders: Secondary | ICD-10-CM

## 2021-10-09 DIAGNOSIS — E1159 Type 2 diabetes mellitus with other circulatory complications: Secondary | ICD-10-CM

## 2021-10-09 DIAGNOSIS — I700 Atherosclerosis of aorta: Secondary | ICD-10-CM | POA: Diagnosis not present

## 2021-10-09 DIAGNOSIS — N529 Male erectile dysfunction, unspecified: Secondary | ICD-10-CM

## 2021-10-09 DIAGNOSIS — E1169 Type 2 diabetes mellitus with other specified complication: Secondary | ICD-10-CM | POA: Diagnosis not present

## 2021-10-09 DIAGNOSIS — E1121 Type 2 diabetes mellitus with diabetic nephropathy: Secondary | ICD-10-CM | POA: Insufficient documentation

## 2021-10-09 DIAGNOSIS — E669 Obesity, unspecified: Secondary | ICD-10-CM

## 2021-10-09 DIAGNOSIS — E785 Hyperlipidemia, unspecified: Secondary | ICD-10-CM

## 2021-10-09 DIAGNOSIS — E291 Testicular hypofunction: Secondary | ICD-10-CM

## 2021-10-09 DIAGNOSIS — E119 Type 2 diabetes mellitus without complications: Secondary | ICD-10-CM | POA: Diagnosis not present

## 2021-10-09 LAB — POCT UA - MICROALBUMIN
Albumin/Creatinine Ratio, Urine, POC: 35.2
Creatinine, POC: 142.9 mg/dL
Microalbumin Ur, POC: 50.3 mg/L

## 2021-10-09 LAB — POCT GLYCOSYLATED HEMOGLOBIN (HGB A1C): Hemoglobin A1C: 6.9 % — AB (ref 4.0–5.6)

## 2021-10-09 MED ORDER — SIMVASTATIN 40 MG PO TABS: ORAL_TABLET | ORAL | 3 refills | Status: DC

## 2021-10-09 MED ORDER — TESTOSTERONE 20.25 MG/ACT (1.62%) TD GEL: TRANSDERMAL | 5 refills | Status: DC

## 2021-10-09 MED ORDER — VALSARTAN-HYDROCHLOROTHIAZIDE 320-12.5 MG PO TABS: 1.0000 | ORAL_TABLET | Freq: Every day | ORAL | 3 refills | Status: DC

## 2021-10-09 MED ORDER — AMLODIPINE BESYLATE 10 MG PO TABS: 10.0000 mg | ORAL_TABLET | Freq: Every day | ORAL | 3 refills | Status: DC

## 2021-10-09 MED ORDER — SILDENAFIL CITRATE 20 MG PO TABS: ORAL_TABLET | ORAL | 3 refills | Status: DC

## 2021-10-09 NOTE — Progress Notes (Signed)
Patient: [name] [name]   DOB: 05/20/1950   71 y.o. Male  MRN: [id number]  Subjective:      [name] [name] is a 71 y.o. male who presents today for a an interval evaluation. He reports consuming a diabetic, 1600 calorie diet.  Staying active at least 20 min a day.   He generally feels well. He reports sleeping well. He does not have additional problems to discuss today.  He is retired and enjoying his retirement.  He has an eye exam set up for November.  He continues on simvastatin and is having no aches or pains with that.  Continues on amlodipine and valsartan/HCTZ.  Does use sildenafil on an as-needed basis.  He continues on testosterone and notes good response with energy and stamina.  He does not smoke.   Most recent fall risk assessment:    03/22/2021   11:20 AM  [place] in the past year? 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 08/21/2021   Type 2 diabetes mellitus without complication, without long-term current use of insulin ([place]) 10/16/2020   Aortic atherosclerosis ([place]) 10/16/2020   History of GI diverticular bleed 02/14/2020   Diverticulosis 11/03/2019   Spinal stenosis 10/06/2019   Hypertension associated with diabetes ([place]) 01/17/2011   Hyperlipidemia associated with type 2 diabetes mellitus ([place]) 01/17/2011   Hypogonadism male 01/17/2011   Sleep apnea 01/17/2011   History of hyperparathyroidism 01/17/2011   ED (erectile dysfunction) 01/17/2011   Obesity (BMI 30-39.9) 01/17/2011   Past Medical History:  Diagnosis Date   Dyslipidemia    ED (erectile dysfunction)    Hyperlipidemia    Hyperparathyroidism    Hypertension     Hypogonadism, male    Sleep apnea    Past Surgical History:  Procedure Laterality Date   PARATHYROIDECTOMY     SKIN BIOPSY Left 03/02/2019   squamous cell carcinoma in situ, hypertrophic, traumatized   SKIN BIOPSY Right 08/01/2021   residual squamous cell caarcinoma   Social History   Tobacco Use   Smoking status: Never   Smokeless tobacco: Never  Vaping Use   Vaping Use: Never used  Substance Use Topics   Alcohol use: Yes    Alcohol/week: 14.0 standard drinks of alcohol    Types: 14 Cans of beer per week   Drug use: No   Family History  Problem Relation Age of Onset   Hypertension Father    Hyperlipidemia Father    Allergies  Allergen Reactions   Yellow Jacket Venom Anaphylaxis      Patient Care Team: Denita Lung, MD as PCP - General (Family Medicine)   Outpatient Medications Prior to Visit  Medication Sig Note   Accu-Chek FastClix Lancets MISC 1 each by Does not apply route daily.    fish oil-omega-3 fatty acids 1000 MG capsule Take 2 g by mouth daily.    Glucosamine-Chondroit-Vit C-Mn (GLUCOSAMINE 1500 COMPLEX) CAPS Take 1 capsule by mouth daily.     glucose blood (ACCU-CHEK GUIDE) test strip Use one daily to check blood sugar    Multiple  Vitamins-Minerals (MULTIVITAMIN WITH MINERALS) tablet Take 1 tablet by mouth daily.    [DISCONTINUED] amLODipine (NORVASC) 10 MG tablet TAKE 1 TABLET BY MOUTH EVERY DAY    [DISCONTINUED] aspirin EC 81 MG tablet Take 81 mg by mouth daily. Swallow whole.    [DISCONTINUED] sildenafil (REVATIO) 20 MG tablet Take 1 to 5 pills as needed daily (Patient taking differently: Take 20-100 mg by mouth daily as needed (erectile dysfunction).) 10/09/2021: Prn last dose last week   [DISCONTINUED] simvastatin (ZOCOR) 40 MG tablet TAKE 1 TABLET BY MOUTH EVERYDAY AT BEDTIME    [DISCONTINUED] Testosterone 20.25 MG/ACT (1.62%) GEL APPLY 3 PUMPS TO SHOULDER AND ARM DAILY    [DISCONTINUED] valsartan-hydrochlorothiazide (DIOVAN-HCT) 320-12.5 MG  tablet TAKE 1 TABLET BY MOUTH EVERY DAY    EPINEPHrine (EPI-PEN) 0.3 mg/0.3 mL DEVI Inject 0.3 mLs (0.3 mg total) into the muscle once. (Patient not taking: Reported on 10/09/2021)    No facility-administered medications prior to visit.    Review of Systems  All other systems reviewed and are negative.         Objective:     BP 130/80   Pulse 68   Temp 97.7 F (36.5 C)   Ht 5' 9.25" (1.759 m)   Wt 234 lb 6.4 oz (106.3 kg)   SpO2 97%   BMI 34.37 kg/m  BP Readings from Last 3 Encounters:  10/09/21 130/80  06/12/21 140/76  10/16/20 138/82   Wt Readings from Last 3 Encounters:  10/09/21 234 lb 6.4 oz (106.3 kg)  06/12/21 238 lb 12.8 oz (108.3 kg)  03/22/21 235 lb (106.6 kg)      Physical Exam  Alert and in no distress. Tympanic membranes and canals are normal. Pharyngeal area is normal. Neck is supple without adenopathy or thyromegaly. Cardiac exam shows a regular sinus rhythm without murmurs or gallops. Lungs are clear to auscultation. Hemoglobin A1c is 6.9 Last CBC Lab Results  Component Value Date   WBC 7.1 02/14/2020   HGB 16.6 02/14/2020   HCT 47.7 02/14/2020   MCV 83 02/14/2020   MCH 28.9 02/14/2020   RDW 12.9 02/14/2020   PLT 184 29/52/8413   Last metabolic panel Lab Results  Component Value Date   GLUCOSE 117 (H) 11/03/2019   NA 141 11/03/2019   K 2.8 (L) 11/03/2019   CL 108 11/03/2019   CO2 26 11/03/2019   BUN 9 11/03/2019   CREATININE 0.76 11/03/2019   GFRNONAA >60 11/03/2019   CALCIUM 8.2 (L) 11/03/2019   PHOS 2.8 02/26/2007   PROT 5.8 (L) 11/02/2019   ALBUMIN 3.6 11/02/2019   LABGLOB 2.2 10/06/2019   AGRATIO 2.0 10/06/2019   BILITOT 2.1 (H) 11/02/2019   ALKPHOS 42 11/02/2019   AST 20 11/02/2019   ALT 22 11/02/2019   ANIONGAP 7 11/03/2019   Last lipids Lab Results  Component Value Date   CHOL 211 (H) 02/14/2020   HDL 49 02/14/2020   LDLCALC 140 (H) 02/14/2020   TRIG 125 02/14/2020   CHOLHDL 4.3 02/14/2020   Last hemoglobin  A1c Lab Results  Component Value Date   HGBA1C 6.5 (A) 06/12/2021        Assessment & Plan:    Type 2 diabetes mellitus without complication, without long-term current use of insulin ([place]) - Plan: POCT glycosylated hemoglobin (Hb A1C), POCT UA - Microalbumin  Hypertension associated with diabetes ([place]) - Plan: Comprehensive metabolic panel, CBC with Differential/Platelet, valsartan-hydrochlorothiazide (DIOVAN-HCT) 320-12.5 MG tablet, amLODipine (NORVASC) 10 MG tablet  Hyperlipidemia associated with type  2 diabetes mellitus ([place]) - Plan: Lipid panel, simvastatin (ZOCOR) 40 MG tablet  Hypogonadism male - Plan: Testosterone, Testosterone 20.25 MG/ACT (1.62%) GEL  Obesity (BMI 30-39.9) - Plan: Comprehensive metabolic panel, CBC with Differential/Platelet, Lipid panel  Aortic atherosclerosis ([facility])  Erectile dysfunction, unspecified erectile dysfunction type - Plan: sildenafil (REVATIO) 20 MG tablet  Immunization History  Administered Date(s) Administered   Fluad Quad(high Dose 65+) 11/28/2019   Hepatitis A 04/28/2007, 05/31/2007   Hepatitis B 04/28/2007, 05/31/2007, 01/08/2010   Influenza Split 11/16/2011, 12/14/2012   Influenza Whole 12/19/2008   Influenza, High Dose Seasonal PF 11/12/2015, 12/30/2016, 12/29/2017, 11/18/2018   Influenza,inj,Quad PF,6+ Mos 12/29/2013, 11/07/2014   Influenza,inj,quad, With Preservative 12/11/2016   Influenza-Unspecified 01/07/2021   Moderna SARS-COV2 Booster Vaccination 10/16/2020   Moderna Sars-Covid-2 Vaccination 04/19/2019, 05/19/2019, 01/31/2020   Pfizer Covid-19 Vaccine Bivalent Booster 69yr & up 03/12/2021   Pneumococcal Conjugate-13 11/12/2015   Pneumococcal Polysaccharide-23 01/17/2011   Tdap 04/28/2007, 10/05/2017   Zoster Recombinat (Shingrix) 07/11/2016, 09/29/2016   Zoster, Live 01/17/2011    Health Maintenance  Topic Date Due   Pneumonia Vaccine 71 Years old (3 - PPSV23 or PCV20) 07/14/2022 (Originally 11/11/2016)   INFLUENZA  VACCINE  10/15/2021   HEMOGLOBIN A1C  12/13/2021   OPHTHALMOLOGY EXAM  02/05/2022   FOOT EXAM  06/13/2022   COLONOSCOPY (Pts 45-437yrInsurance coverage will need to be confirmed)  03/13/2025   TETANUS/TDAP  10/06/2027   COVID-19 Vaccine  Completed   Hepatitis C Screening  Completed   Zoster Vaccines- Shingrix  Completed   HPV VACCINES  Aged Out   He will continue on his present medication regimen.  Discussed the ongoing care of his diabetes indicating that at some point we will need to place him on medication for that but at the present time would like him to work harder on his diet and exercise regimen. Discussed health benefits of physical activity, and encouraged him to engage in regular exercise appropriate for his age and condition.  Problem List Items Addressed This Visit     Aortic atherosclerosis (HCTrinity  Relevant Medications   valsartan-hydrochlorothiazide (DIOVAN-HCT) 320-12.5 MG tablet   amLODipine (NORVASC) 10 MG tablet   simvastatin (ZOCOR) 40 MG tablet   sildenafil (REVATIO) 20 MG tablet   ED (erectile dysfunction)   Relevant Medications   sildenafil (REVATIO) 20 MG tablet   Hyperlipidemia associated with type 2 diabetes mellitus ([facility])   Relevant Medications   valsartan-hydrochlorothiazide (DIOVAN-HCT) 320-12.5 MG tablet   amLODipine (NORVASC) 10 MG tablet   simvastatin (ZOCOR) 40 MG tablet   sildenafil (REVATIO) 20 MG tablet   Other Relevant Orders   Lipid panel   Hypertension associated with diabetes ([facility])   Relevant Medications   valsartan-hydrochlorothiazide (DIOVAN-HCT) 320-12.5 MG tablet   amLODipine (NORVASC) 10 MG tablet   simvastatin (ZOCOR) 40 MG tablet   sildenafil (REVATIO) 20 MG tablet   Other Relevant Orders   Comprehensive metabolic panel   CBC with Differential/Platelet   Hypogonadism male   Relevant Medications   Testosterone 20.25 MG/ACT (1.62%) GEL   Other Relevant Orders   Testosterone   Obesity (BMI 30-39.9)   Relevant Orders    Comprehensive metabolic panel   CBC with Differential/Platelet   Lipid panel   Type 2 diabetes mellitus without complication, without long-term current use of insulin ([facility]) - Primary   Relevant Medications   valsartan-hydrochlorothiazide (DIOVAN-HCT) 320-12.5 MG tablet   simvastatin (ZOCOR) 40 MG tablet   Other Relevant Orders   POCT glycosylated hemoglobin (Hb  A1C)   POCT UA - Microalbumin   Return in about 1 year (around 10/10/2022) for med check plus and 6 months diabetes .     Jill Alexanders, MD

## 2021-10-09 NOTE — Patient Instructions (Signed)
Health Maintenance, Male Adopting a healthy lifestyle and getting preventive care are important in promoting health and wellness. Ask your health care provider about: The right schedule for you to have regular tests and exams. Things you can do on your own to prevent diseases and keep yourself healthy. What should I know about diet, weight, and exercise? Eat a healthy diet  Eat a diet that includes plenty of vegetables, fruits, low-fat dairy products, and lean protein. Do not eat a lot of foods that are high in solid fats, added sugars, or sodium. Maintain a healthy weight Body mass index (BMI) is a measurement that can be used to identify possible weight problems. It estimates body fat based on height and weight. Your health care provider can help determine your BMI and help you achieve or maintain a healthy weight. Get regular exercise Get regular exercise. This is one of the most important things you can do for your health. Most adults should: Exercise for at least 150 minutes each week. The exercise should increase your heart rate and make you sweat (moderate-intensity exercise). Do strengthening exercises at least twice a week. This is in addition to the moderate-intensity exercise. Spend less time sitting. Even light physical activity can be beneficial. Watch cholesterol and blood lipids Have your blood tested for lipids and cholesterol at 71 years of age, then have this test every 5 years. You may need to have your cholesterol levels checked more often if: Your lipid or cholesterol levels are high. You are older than 71 years of age. You are at high risk for heart disease. What should I know about cancer screening? Many types of cancers can be detected early and may often be prevented. Depending on your health history and family history, you may need to have cancer screening at various ages. This may include screening for: Colorectal cancer. Prostate cancer. Skin cancer. Lung  cancer. What should I know about heart disease, diabetes, and high blood pressure? Blood pressure and heart disease High blood pressure causes heart disease and increases the risk of stroke. This is more likely to develop in people who have high blood pressure readings or are overweight. Talk with your health care provider about your target blood pressure readings. Have your blood pressure checked: Every 3-5 years if you are 18-39 years of age. Every year if you are 40 years old or older. If you are between the ages of 65 and 75 and are a current or former smoker, ask your health care provider if you should have a one-time screening for abdominal aortic aneurysm (AAA). Diabetes Have regular diabetes screenings. This checks your fasting blood sugar level. Have the screening done: Once every three years after age 45 if you are at a normal weight and have a low risk for diabetes. More often and at a younger age if you are overweight or have a high risk for diabetes. What should I know about preventing infection? Hepatitis B If you have a higher risk for hepatitis B, you should be screened for this virus. Talk with your health care provider to find out if you are at risk for hepatitis B infection. Hepatitis C Blood testing is recommended for: Everyone born from 1945 through 1965. Anyone with known risk factors for hepatitis C. Sexually transmitted infections (STIs) You should be screened each year for STIs, including gonorrhea and chlamydia, if: You are sexually active and are younger than 71 years of age. You are older than 71 years of age and your   health care provider tells you that you are at risk for this type of infection. Your sexual activity has changed since you were last screened, and you are at increased risk for chlamydia or gonorrhea. Ask your health care provider if you are at risk. Ask your health care provider about whether you are at high risk for HIV. Your health care provider  may recommend a prescription medicine to help prevent HIV infection. If you choose to take medicine to prevent HIV, you should first get tested for HIV. You should then be tested every 3 months for as long as you are taking the medicine. Follow these instructions at home: Alcohol use Do not drink alcohol if your health care provider tells you not to drink. If you drink alcohol: Limit how much you have to 0-2 drinks a day. Know how much alcohol is in your drink. In the [place] one drink equals one 12 oz bottle of beer (355 mL), one 5 oz glass of wine (148 mL), or one 1 oz glass of hard liquor (44 mL). Lifestyle Do not use any products that contain nicotine or tobacco. These products include cigarettes, chewing tobacco, and vaping devices, such as e-cigarettes. If you need help quitting, ask your health care provider. Do not use street drugs. Do not share needles. Ask your health care provider for help if you need support or information about quitting drugs. General instructions Schedule regular health, dental, and eye exams. Stay current with your vaccines. Tell your health care provider if: You often feel depressed. You have ever been abused or do not feel safe at home. Summary Adopting a healthy lifestyle and getting preventive care are important in promoting health and wellness. Follow your health care provider's instructions about healthy diet, exercising, and getting tested or screened for diseases. Follow your health care provider's instructions on monitoring your cholesterol and blood pressure. This information is not intended to replace advice given to you by your health care provider. Make sure you discuss any questions you have with your health care provider. Document Revised: 07/23/2020 Document Reviewed: 07/23/2020 Elsevier Patient Education  2023 [employer]  

## 2021-10-10 LAB — CBC WITH DIFFERENTIAL/PLATELET
Basophils Absolute: 0.1 10*3/uL (ref 0.0–0.2)
Basos: 1 %
EOS (ABSOLUTE): 0.2 10*3/uL (ref 0.0–0.4)
Eos: 3 %
Hematocrit: 44.8 % (ref 37.5–51.0)
Hemoglobin: 15.6 g/dL (ref 13.0–17.7)
Immature Grans (Abs): 0 10*3/uL (ref 0.0–0.1)
Immature Granulocytes: 0 %
Lymphocytes Absolute: 1 10*3/uL (ref 0.7–3.1)
Lymphs: 16 %
MCH: 30.4 pg (ref 26.6–33.0)
MCHC: 34.8 g/dL (ref 31.5–35.7)
MCV: 87 fL (ref 79–97)
Monocytes Absolute: 0.5 10*3/uL (ref 0.1–0.9)
Monocytes: 8 %
Neutrophils Absolute: 4.4 10*3/uL (ref 1.4–7.0)
Neutrophils: 72 %
Platelets: 178 10*3/uL (ref 150–450)
RBC: 5.13 x10E6/uL (ref 4.14–5.80)
RDW: 11.7 % (ref 11.6–15.4)
WBC: 6.2 10*3/uL (ref 3.4–10.8)

## 2021-10-10 LAB — LIPID PANEL
Chol/HDL Ratio: 4.1 ratio (ref 0.0–5.0)
Cholesterol, Total: 187 mg/dL (ref 100–199)
HDL: 46 mg/dL (ref >39)
LDL Chol Calc (NIH): 122 mg/dL — ABNORMAL HIGH (ref 0–99)
Triglycerides: 104 mg/dL (ref 0–149)
VLDL Cholesterol Cal: 19 mg/dL (ref 5–40)

## 2021-10-10 LAB — COMPREHENSIVE METABOLIC PANEL
ALT: 16 IU/L (ref 0–44)
AST: 20 IU/L (ref 0–40)
Albumin/Globulin Ratio: 1.8 (ref 1.2–2.2)
Albumin: 4.6 g/dL (ref 3.8–4.8)
Alkaline Phosphatase: 68 IU/L (ref 44–121)
BUN/Creatinine Ratio: 16 (ref 10–24)
BUN: 20 mg/dL (ref 8–27)
Bilirubin Total: 1.6 mg/dL — ABNORMAL HIGH (ref 0.0–1.2)
CO2: 25 mmol/L (ref 20–29)
Calcium: 9.4 mg/dL (ref 8.6–10.2)
Chloride: 99 mmol/L (ref 96–106)
Creatinine, Ser: 1.26 mg/dL (ref 0.76–1.27)
Globulin, Total: 2.6 g/dL (ref 1.5–4.5)
Glucose: 178 mg/dL — ABNORMAL HIGH (ref 70–99)
Potassium: 3.6 mmol/L (ref 3.5–5.2)
Sodium: 141 mmol/L (ref 134–144)
Total Protein: 7.2 g/dL (ref 6.0–8.5)
eGFR: 61 mL/min/{1.73_m2} (ref >59)

## 2021-10-10 LAB — TESTOSTERONE: Testosterone: 605 ng/dL (ref 264–916)

## 2021-10-10 MED ORDER — ROSUVASTATIN CALCIUM 40 MG PO TABS: 40.0000 mg | ORAL_TABLET | Freq: Every day | ORAL | 3 refills | Status: DC

## 2021-10-10 NOTE — Addendum Note (Signed)
Addended by: Denita Lung on: 10/10/2021 08:13 AM   Modules accepted: Orders

## 2021-10-11 DIAGNOSIS — M5388 Other specified dorsopathies, sacral and sacrococcygeal region: Secondary | ICD-10-CM | POA: Diagnosis not present

## 2021-10-11 DIAGNOSIS — M47892 Other spondylosis, cervical region: Secondary | ICD-10-CM | POA: Diagnosis not present

## 2021-10-11 DIAGNOSIS — M9903 Segmental and somatic dysfunction of lumbar region: Secondary | ICD-10-CM | POA: Diagnosis not present

## 2021-10-11 DIAGNOSIS — M47816 Spondylosis without myelopathy or radiculopathy, lumbar region: Secondary | ICD-10-CM | POA: Diagnosis not present

## 2021-10-11 DIAGNOSIS — M9904 Segmental and somatic dysfunction of sacral region: Secondary | ICD-10-CM | POA: Diagnosis not present

## 2021-10-11 DIAGNOSIS — M9901 Segmental and somatic dysfunction of cervical region: Secondary | ICD-10-CM | POA: Diagnosis not present

## 2021-10-14 DIAGNOSIS — M9903 Segmental and somatic dysfunction of lumbar region: Secondary | ICD-10-CM | POA: Diagnosis not present

## 2021-10-14 DIAGNOSIS — M9901 Segmental and somatic dysfunction of cervical region: Secondary | ICD-10-CM | POA: Diagnosis not present

## 2021-10-14 DIAGNOSIS — M47816 Spondylosis without myelopathy or radiculopathy, lumbar region: Secondary | ICD-10-CM | POA: Diagnosis not present

## 2021-10-14 DIAGNOSIS — M47892 Other spondylosis, cervical region: Secondary | ICD-10-CM | POA: Diagnosis not present

## 2021-10-14 DIAGNOSIS — M5388 Other specified dorsopathies, sacral and sacrococcygeal region: Secondary | ICD-10-CM | POA: Diagnosis not present

## 2021-10-14 DIAGNOSIS — M9904 Segmental and somatic dysfunction of sacral region: Secondary | ICD-10-CM | POA: Diagnosis not present

## 2021-10-17 DIAGNOSIS — M9904 Segmental and somatic dysfunction of sacral region: Secondary | ICD-10-CM | POA: Diagnosis not present

## 2021-10-17 DIAGNOSIS — M47816 Spondylosis without myelopathy or radiculopathy, lumbar region: Secondary | ICD-10-CM | POA: Diagnosis not present

## 2021-10-17 DIAGNOSIS — M47892 Other spondylosis, cervical region: Secondary | ICD-10-CM | POA: Diagnosis not present

## 2021-10-17 DIAGNOSIS — M9903 Segmental and somatic dysfunction of lumbar region: Secondary | ICD-10-CM | POA: Diagnosis not present

## 2021-10-17 DIAGNOSIS — M9901 Segmental and somatic dysfunction of cervical region: Secondary | ICD-10-CM | POA: Diagnosis not present

## 2021-10-17 DIAGNOSIS — M5388 Other specified dorsopathies, sacral and sacrococcygeal region: Secondary | ICD-10-CM | POA: Diagnosis not present

## 2021-10-21 DIAGNOSIS — M5416 Radiculopathy, lumbar region: Secondary | ICD-10-CM | POA: Diagnosis not present

## 2021-10-21 DIAGNOSIS — M5451 Vertebrogenic low back pain: Secondary | ICD-10-CM | POA: Diagnosis not present

## 2021-10-23 DIAGNOSIS — M9901 Segmental and somatic dysfunction of cervical region: Secondary | ICD-10-CM | POA: Diagnosis not present

## 2021-10-23 DIAGNOSIS — M5388 Other specified dorsopathies, sacral and sacrococcygeal region: Secondary | ICD-10-CM | POA: Diagnosis not present

## 2021-10-23 DIAGNOSIS — M47892 Other spondylosis, cervical region: Secondary | ICD-10-CM | POA: Diagnosis not present

## 2021-10-23 DIAGNOSIS — M9903 Segmental and somatic dysfunction of lumbar region: Secondary | ICD-10-CM | POA: Diagnosis not present

## 2021-10-23 DIAGNOSIS — M47816 Spondylosis without myelopathy or radiculopathy, lumbar region: Secondary | ICD-10-CM | POA: Diagnosis not present

## 2021-10-23 DIAGNOSIS — M9904 Segmental and somatic dysfunction of sacral region: Secondary | ICD-10-CM | POA: Diagnosis not present

## 2021-10-29 DIAGNOSIS — M5416 Radiculopathy, lumbar region: Secondary | ICD-10-CM | POA: Diagnosis not present

## 2021-10-29 DIAGNOSIS — M47816 Spondylosis without myelopathy or radiculopathy, lumbar region: Secondary | ICD-10-CM | POA: Diagnosis not present

## 2021-10-29 DIAGNOSIS — M9903 Segmental and somatic dysfunction of lumbar region: Secondary | ICD-10-CM | POA: Diagnosis not present

## 2021-10-29 DIAGNOSIS — M5388 Other specified dorsopathies, sacral and sacrococcygeal region: Secondary | ICD-10-CM | POA: Diagnosis not present

## 2021-10-29 DIAGNOSIS — M9901 Segmental and somatic dysfunction of cervical region: Secondary | ICD-10-CM | POA: Diagnosis not present

## 2021-10-29 DIAGNOSIS — M47892 Other spondylosis, cervical region: Secondary | ICD-10-CM | POA: Diagnosis not present

## 2021-10-29 DIAGNOSIS — M9904 Segmental and somatic dysfunction of sacral region: Secondary | ICD-10-CM | POA: Diagnosis not present

## 2021-11-01 DIAGNOSIS — M47816 Spondylosis without myelopathy or radiculopathy, lumbar region: Secondary | ICD-10-CM | POA: Diagnosis not present

## 2021-11-01 DIAGNOSIS — M47892 Other spondylosis, cervical region: Secondary | ICD-10-CM | POA: Diagnosis not present

## 2021-11-01 DIAGNOSIS — M5388 Other specified dorsopathies, sacral and sacrococcygeal region: Secondary | ICD-10-CM | POA: Diagnosis not present

## 2021-11-01 DIAGNOSIS — M9904 Segmental and somatic dysfunction of sacral region: Secondary | ICD-10-CM | POA: Diagnosis not present

## 2021-11-01 DIAGNOSIS — M9903 Segmental and somatic dysfunction of lumbar region: Secondary | ICD-10-CM | POA: Diagnosis not present

## 2021-11-01 DIAGNOSIS — M9901 Segmental and somatic dysfunction of cervical region: Secondary | ICD-10-CM | POA: Diagnosis not present

## 2021-11-04 DIAGNOSIS — M47816 Spondylosis without myelopathy or radiculopathy, lumbar region: Secondary | ICD-10-CM | POA: Diagnosis not present

## 2021-11-04 DIAGNOSIS — M9903 Segmental and somatic dysfunction of lumbar region: Secondary | ICD-10-CM | POA: Diagnosis not present

## 2021-11-04 DIAGNOSIS — M9901 Segmental and somatic dysfunction of cervical region: Secondary | ICD-10-CM | POA: Diagnosis not present

## 2021-11-04 DIAGNOSIS — M47892 Other spondylosis, cervical region: Secondary | ICD-10-CM | POA: Diagnosis not present

## 2021-11-04 DIAGNOSIS — M5388 Other specified dorsopathies, sacral and sacrococcygeal region: Secondary | ICD-10-CM | POA: Diagnosis not present

## 2021-11-04 DIAGNOSIS — M9904 Segmental and somatic dysfunction of sacral region: Secondary | ICD-10-CM | POA: Diagnosis not present

## 2021-11-06 DIAGNOSIS — M9903 Segmental and somatic dysfunction of lumbar region: Secondary | ICD-10-CM | POA: Diagnosis not present

## 2021-11-06 DIAGNOSIS — M47817 Spondylosis without myelopathy or radiculopathy, lumbosacral region: Secondary | ICD-10-CM | POA: Diagnosis not present

## 2021-11-06 DIAGNOSIS — M5388 Other specified dorsopathies, sacral and sacrococcygeal region: Secondary | ICD-10-CM | POA: Diagnosis not present

## 2021-11-06 DIAGNOSIS — M9904 Segmental and somatic dysfunction of sacral region: Secondary | ICD-10-CM | POA: Diagnosis not present

## 2021-11-06 DIAGNOSIS — M47892 Other spondylosis, cervical region: Secondary | ICD-10-CM | POA: Diagnosis not present

## 2021-11-06 DIAGNOSIS — M9901 Segmental and somatic dysfunction of cervical region: Secondary | ICD-10-CM | POA: Diagnosis not present

## 2021-11-11 DIAGNOSIS — M9904 Segmental and somatic dysfunction of sacral region: Secondary | ICD-10-CM | POA: Diagnosis not present

## 2021-11-11 DIAGNOSIS — M47892 Other spondylosis, cervical region: Secondary | ICD-10-CM | POA: Diagnosis not present

## 2021-11-11 DIAGNOSIS — M47817 Spondylosis without myelopathy or radiculopathy, lumbosacral region: Secondary | ICD-10-CM | POA: Diagnosis not present

## 2021-11-11 DIAGNOSIS — M9901 Segmental and somatic dysfunction of cervical region: Secondary | ICD-10-CM | POA: Diagnosis not present

## 2021-11-11 DIAGNOSIS — M9903 Segmental and somatic dysfunction of lumbar region: Secondary | ICD-10-CM | POA: Diagnosis not present

## 2021-11-11 DIAGNOSIS — M5388 Other specified dorsopathies, sacral and sacrococcygeal region: Secondary | ICD-10-CM | POA: Diagnosis not present

## 2021-11-12 DIAGNOSIS — M545 Low back pain, unspecified: Secondary | ICD-10-CM | POA: Diagnosis not present

## 2021-11-15 DIAGNOSIS — M9901 Segmental and somatic dysfunction of cervical region: Secondary | ICD-10-CM | POA: Diagnosis not present

## 2021-11-15 DIAGNOSIS — M47817 Spondylosis without myelopathy or radiculopathy, lumbosacral region: Secondary | ICD-10-CM | POA: Diagnosis not present

## 2021-11-15 DIAGNOSIS — M9904 Segmental and somatic dysfunction of sacral region: Secondary | ICD-10-CM | POA: Diagnosis not present

## 2021-11-15 DIAGNOSIS — M9903 Segmental and somatic dysfunction of lumbar region: Secondary | ICD-10-CM | POA: Diagnosis not present

## 2021-11-15 DIAGNOSIS — M5388 Other specified dorsopathies, sacral and sacrococcygeal region: Secondary | ICD-10-CM | POA: Diagnosis not present

## 2021-11-15 DIAGNOSIS — M47892 Other spondylosis, cervical region: Secondary | ICD-10-CM | POA: Diagnosis not present

## 2021-11-20 ENCOUNTER — Encounter: Payer: Self-pay | Admitting: Internal Medicine

## 2021-12-10 DIAGNOSIS — M9904 Segmental and somatic dysfunction of sacral region: Secondary | ICD-10-CM | POA: Diagnosis not present

## 2021-12-10 DIAGNOSIS — M9901 Segmental and somatic dysfunction of cervical region: Secondary | ICD-10-CM | POA: Diagnosis not present

## 2021-12-10 DIAGNOSIS — M47892 Other spondylosis, cervical region: Secondary | ICD-10-CM | POA: Diagnosis not present

## 2021-12-10 DIAGNOSIS — M9903 Segmental and somatic dysfunction of lumbar region: Secondary | ICD-10-CM | POA: Diagnosis not present

## 2021-12-10 DIAGNOSIS — M5388 Other specified dorsopathies, sacral and sacrococcygeal region: Secondary | ICD-10-CM | POA: Diagnosis not present

## 2021-12-10 DIAGNOSIS — M47817 Spondylosis without myelopathy or radiculopathy, lumbosacral region: Secondary | ICD-10-CM | POA: Diagnosis not present

## 2021-12-12 DIAGNOSIS — M47817 Spondylosis without myelopathy or radiculopathy, lumbosacral region: Secondary | ICD-10-CM | POA: Diagnosis not present

## 2021-12-12 DIAGNOSIS — M47892 Other spondylosis, cervical region: Secondary | ICD-10-CM | POA: Diagnosis not present

## 2021-12-12 DIAGNOSIS — M9901 Segmental and somatic dysfunction of cervical region: Secondary | ICD-10-CM | POA: Diagnosis not present

## 2021-12-12 DIAGNOSIS — Z23 Encounter for immunization: Secondary | ICD-10-CM | POA: Diagnosis not present

## 2021-12-12 DIAGNOSIS — M9903 Segmental and somatic dysfunction of lumbar region: Secondary | ICD-10-CM | POA: Diagnosis not present

## 2021-12-12 DIAGNOSIS — M5388 Other specified dorsopathies, sacral and sacrococcygeal region: Secondary | ICD-10-CM | POA: Diagnosis not present

## 2021-12-12 DIAGNOSIS — M9904 Segmental and somatic dysfunction of sacral region: Secondary | ICD-10-CM | POA: Diagnosis not present

## 2021-12-15 DIAGNOSIS — Z23 Encounter for immunization: Secondary | ICD-10-CM | POA: Diagnosis not present

## 2021-12-18 DIAGNOSIS — M9904 Segmental and somatic dysfunction of sacral region: Secondary | ICD-10-CM | POA: Diagnosis not present

## 2021-12-18 DIAGNOSIS — M5388 Other specified dorsopathies, sacral and sacrococcygeal region: Secondary | ICD-10-CM | POA: Diagnosis not present

## 2021-12-18 DIAGNOSIS — M47892 Other spondylosis, cervical region: Secondary | ICD-10-CM | POA: Diagnosis not present

## 2021-12-18 DIAGNOSIS — M9903 Segmental and somatic dysfunction of lumbar region: Secondary | ICD-10-CM | POA: Diagnosis not present

## 2021-12-18 DIAGNOSIS — M47817 Spondylosis without myelopathy or radiculopathy, lumbosacral region: Secondary | ICD-10-CM | POA: Diagnosis not present

## 2021-12-18 DIAGNOSIS — M9901 Segmental and somatic dysfunction of cervical region: Secondary | ICD-10-CM | POA: Diagnosis not present

## 2021-12-27 DIAGNOSIS — M9904 Segmental and somatic dysfunction of sacral region: Secondary | ICD-10-CM | POA: Diagnosis not present

## 2021-12-27 DIAGNOSIS — M47816 Spondylosis without myelopathy or radiculopathy, lumbar region: Secondary | ICD-10-CM | POA: Diagnosis not present

## 2021-12-27 DIAGNOSIS — M47892 Other spondylosis, cervical region: Secondary | ICD-10-CM | POA: Diagnosis not present

## 2021-12-27 DIAGNOSIS — M9901 Segmental and somatic dysfunction of cervical region: Secondary | ICD-10-CM | POA: Diagnosis not present

## 2021-12-27 DIAGNOSIS — M5388 Other specified dorsopathies, sacral and sacrococcygeal region: Secondary | ICD-10-CM | POA: Diagnosis not present

## 2021-12-27 DIAGNOSIS — M9903 Segmental and somatic dysfunction of lumbar region: Secondary | ICD-10-CM | POA: Diagnosis not present

## 2022-01-01 DIAGNOSIS — M9903 Segmental and somatic dysfunction of lumbar region: Secondary | ICD-10-CM | POA: Diagnosis not present

## 2022-01-01 DIAGNOSIS — M47892 Other spondylosis, cervical region: Secondary | ICD-10-CM | POA: Diagnosis not present

## 2022-01-01 DIAGNOSIS — M5388 Other specified dorsopathies, sacral and sacrococcygeal region: Secondary | ICD-10-CM | POA: Diagnosis not present

## 2022-01-01 DIAGNOSIS — M9901 Segmental and somatic dysfunction of cervical region: Secondary | ICD-10-CM | POA: Diagnosis not present

## 2022-01-01 DIAGNOSIS — M47816 Spondylosis without myelopathy or radiculopathy, lumbar region: Secondary | ICD-10-CM | POA: Diagnosis not present

## 2022-01-01 DIAGNOSIS — M9904 Segmental and somatic dysfunction of sacral region: Secondary | ICD-10-CM | POA: Diagnosis not present

## 2022-01-06 ENCOUNTER — Encounter: Payer: Self-pay | Admitting: Internal Medicine

## 2022-01-20 DIAGNOSIS — L570 Actinic keratosis: Secondary | ICD-10-CM | POA: Diagnosis not present

## 2022-01-20 DIAGNOSIS — L578 Other skin changes due to chronic exposure to nonionizing radiation: Secondary | ICD-10-CM | POA: Diagnosis not present

## 2022-01-21 DIAGNOSIS — M9902 Segmental and somatic dysfunction of thoracic region: Secondary | ICD-10-CM | POA: Diagnosis not present

## 2022-01-21 DIAGNOSIS — M47816 Spondylosis without myelopathy or radiculopathy, lumbar region: Secondary | ICD-10-CM | POA: Diagnosis not present

## 2022-01-21 DIAGNOSIS — M9901 Segmental and somatic dysfunction of cervical region: Secondary | ICD-10-CM | POA: Diagnosis not present

## 2022-01-21 DIAGNOSIS — M9903 Segmental and somatic dysfunction of lumbar region: Secondary | ICD-10-CM | POA: Diagnosis not present

## 2022-01-21 DIAGNOSIS — M47892 Other spondylosis, cervical region: Secondary | ICD-10-CM | POA: Diagnosis not present

## 2022-01-21 DIAGNOSIS — M4723 Other spondylosis with radiculopathy, cervicothoracic region: Secondary | ICD-10-CM | POA: Diagnosis not present

## 2022-02-11 DIAGNOSIS — M4723 Other spondylosis with radiculopathy, cervicothoracic region: Secondary | ICD-10-CM | POA: Diagnosis not present

## 2022-02-11 DIAGNOSIS — M47892 Other spondylosis, cervical region: Secondary | ICD-10-CM | POA: Diagnosis not present

## 2022-02-11 DIAGNOSIS — M47816 Spondylosis without myelopathy or radiculopathy, lumbar region: Secondary | ICD-10-CM | POA: Diagnosis not present

## 2022-02-11 DIAGNOSIS — M9902 Segmental and somatic dysfunction of thoracic region: Secondary | ICD-10-CM | POA: Diagnosis not present

## 2022-02-11 DIAGNOSIS — M9901 Segmental and somatic dysfunction of cervical region: Secondary | ICD-10-CM | POA: Diagnosis not present

## 2022-02-11 DIAGNOSIS — M9903 Segmental and somatic dysfunction of lumbar region: Secondary | ICD-10-CM | POA: Diagnosis not present

## 2022-02-13 DIAGNOSIS — H5213 Myopia, bilateral: Secondary | ICD-10-CM | POA: Diagnosis not present

## 2022-02-13 DIAGNOSIS — E119 Type 2 diabetes mellitus without complications: Secondary | ICD-10-CM | POA: Diagnosis not present

## 2022-02-13 DIAGNOSIS — H524 Presbyopia: Secondary | ICD-10-CM | POA: Diagnosis not present

## 2022-02-13 LAB — HM DIABETES EYE EXAM

## 2022-03-20 DIAGNOSIS — M5388 Other specified dorsopathies, sacral and sacrococcygeal region: Secondary | ICD-10-CM | POA: Diagnosis not present

## 2022-03-20 DIAGNOSIS — M47812 Spondylosis without myelopathy or radiculopathy, cervical region: Secondary | ICD-10-CM | POA: Diagnosis not present

## 2022-03-20 DIAGNOSIS — M47816 Spondylosis without myelopathy or radiculopathy, lumbar region: Secondary | ICD-10-CM | POA: Diagnosis not present

## 2022-03-20 DIAGNOSIS — M9904 Segmental and somatic dysfunction of sacral region: Secondary | ICD-10-CM | POA: Diagnosis not present

## 2022-03-20 DIAGNOSIS — M9903 Segmental and somatic dysfunction of lumbar region: Secondary | ICD-10-CM | POA: Diagnosis not present

## 2022-03-20 DIAGNOSIS — M9901 Segmental and somatic dysfunction of cervical region: Secondary | ICD-10-CM | POA: Diagnosis not present

## 2022-03-25 ENCOUNTER — Ambulatory Visit (INDEPENDENT_AMBULATORY_CARE_PROVIDER_SITE_OTHER): Payer: Medicare Other

## 2022-03-25 VITALS — Ht 70.5 in | Wt 231.0 lb

## 2022-03-25 DIAGNOSIS — Z0000 Encounter for general adult medical examination without abnormal findings: Secondary | ICD-10-CM | POA: Diagnosis not present

## 2022-03-25 NOTE — Patient Instructions (Signed)
[name] [name] , Thank you for taking time to come for your Medicare Wellness Visit. I appreciate your ongoing commitment to your health goals. Please review the following plan we discussed and let me know if I can assist you in the future.   These are the goals we discussed:  Goals      Patient Stated     03/22/2021, wants to get to previous physical activity and lose 10 pounds     Patient Stated     03/25/2022, wants to get weight below 225 pounds        This is a list of the screening recommended for you and due dates:  Health Maintenance  Topic Date Due   COVID-19 Vaccine (6 - 2023-24 season) 02/09/2022   Pneumonia Vaccine (3 - PPSV23 or PCV20) 07/14/2022*   Hemoglobin A1C  04/11/2022   Complete foot exam   06/13/2022   Yearly kidney function blood test for diabetes  10/10/2022   Yearly kidney health urinalysis for diabetes  10/10/2022   Eye exam for diabetics  02/14/2023   Medicare Annual Wellness Visit  03/26/2023   Colon Cancer Screening  03/13/2025   DTaP/Tdap/Td vaccine (3 - Td or Tdap) 10/06/2027   Flu Shot  Completed   Hepatitis C Screening: USPSTF Recommendation to screen - Ages 18-79 yo.  Completed   Zoster (Shingles) Vaccine  Completed   HPV Vaccine  Aged Out  *Topic was postponed. The date shown is not the original due date.    Advanced directives: Please bring a copy of your POA (Power of Attorney) and/or Living Will to your next appointment.   Conditions/risks identified: none  Next appointment: Follow up in one year for your annual wellness visit.   Preventive Care 20 Years and Older, Male  Preventive care refers to lifestyle choices and visits with your health care provider that can promote health and wellness. What does preventive care include? A yearly physical exam. This is also called an annual well check. Dental exams once or twice a year. Routine eye exams. Ask your health care provider how often you should have your eyes checked. Personal lifestyle  choices, including: Daily care of your teeth and gums. Regular physical activity. Eating a healthy diet. Avoiding tobacco and drug use. Limiting alcohol use. Practicing safe sex. Taking low doses of aspirin every day. Taking vitamin and mineral supplements as recommended by your health care provider. What happens during an annual well check? The services and screenings done by your health care provider during your annual well check will depend on your age, overall health, lifestyle risk factors, and family history of disease. Counseling  Your health care provider may ask you questions about your: Alcohol use. Tobacco use. Drug use. Emotional well-being. Home and relationship well-being. Sexual activity. Eating habits. History of falls. Memory and ability to understand (cognition). Work and work Statistician. Screening  You may have the following tests or measurements: Height, weight, and BMI. Blood pressure. Lipid and cholesterol levels. These may be checked every 5 years, or more frequently if you are over 2 years old. Skin check. Lung cancer screening. You may have this screening every year starting at age 60 if you have a 30-pack-year history of smoking and currently smoke or have quit within the past 15 years. Fecal occult blood test (FOBT) of the stool. You may have this test every year starting at age 39. Flexible sigmoidoscopy or colonoscopy. You may have a sigmoidoscopy every 5 years or a colonoscopy every 10  years starting at age 11. Prostate cancer screening. Recommendations will vary depending on your family history and other risks. Hepatitis C blood test. Hepatitis B blood test. Sexually transmitted disease (STD) testing. Diabetes screening. This is done by checking your blood sugar (glucose) after you have not eaten for a while (fasting). You may have this done every 1-3 years. Abdominal aortic aneurysm (AAA) screening. You may need this if you are a current or  former smoker. Osteoporosis. You may be screened starting at age 50 if you are at high risk. Talk with your health care provider about your test results, treatment options, and if necessary, the need for more tests. Vaccines  Your health care provider may recommend certain vaccines, such as: Influenza vaccine. This is recommended every year. Tetanus, diphtheria, and acellular pertussis (Tdap, Td) vaccine. You may need a Td booster every 10 years. Zoster vaccine. You may need this after age 74. Pneumococcal 13-valent conjugate (PCV13) vaccine. One dose is recommended after age 34. Pneumococcal polysaccharide (PPSV23) vaccine. One dose is recommended after age 57. Talk to your health care provider about which screenings and vaccines you need and how often you need them. This information is not intended to replace advice given to you by your health care provider. Make sure you discuss any questions you have with your health care provider. Document Released: 03/30/2015 Document Revised: 11/21/2015 Document Reviewed: 01/02/2015 Elsevier Interactive Patient Education  2017 [place] Prevention in the Home Falls can cause injuries. They can happen to people of all ages. There are many things you can do to make your home safe and to help prevent falls. What can I do on the outside of my home? Regularly fix the edges of walkways and driveways and fix any cracks. Remove anything that might make you trip as you walk through a door, such as a raised step or threshold. Trim any bushes or trees on the path to your home. Use bright outdoor lighting. Clear any walking paths of anything that might make someone trip, such as rocks or tools. Regularly check to see if handrails are loose or broken. Make sure that both sides of any steps have handrails. Any raised decks and porches should have guardrails on the edges. Have any leaves, snow, or ice cleared regularly. Use sand or salt on walking paths  during winter. Clean up any spills in your garage right away. This includes oil or grease spills. What can I do in the bathroom? Use night lights. Install grab bars by the toilet and in the tub and shower. Do not use towel bars as grab bars. Use non-skid mats or decals in the tub or shower. If you need to sit down in the shower, use a plastic, non-slip stool. Keep the floor dry. Clean up any water that spills on the floor as soon as it happens. Remove soap buildup in the tub or shower regularly. Attach bath mats securely with double-sided non-slip rug tape. Do not have throw rugs and other things on the floor that can make you trip. What can I do in the bedroom? Use night lights. Make sure that you have a light by your bed that is easy to reach. Do not use any sheets or blankets that are too big for your bed. They should not hang down onto the floor. Have a firm chair that has side arms. You can use this for support while you get dressed. Do not have throw rugs and other things on the floor  that can make you trip. What can I do in the kitchen? Clean up any spills right away. Avoid walking on wet floors. Keep items that you use a lot in easy-to-reach places. If you need to reach something above you, use a strong step stool that has a grab bar. Keep electrical cords out of the way. Do not use floor polish or wax that makes floors slippery. If you must use wax, use non-skid floor wax. Do not have throw rugs and other things on the floor that can make you trip. What can I do with my stairs? Do not leave any items on the stairs. Make sure that there are handrails on both sides of the stairs and use them. Fix handrails that are broken or loose. Make sure that handrails are as long as the stairways. Check any carpeting to make sure that it is firmly attached to the stairs. Fix any carpet that is loose or worn. Avoid having throw rugs at the top or bottom of the stairs. If you do have throw  rugs, attach them to the floor with carpet tape. Make sure that you have a light switch at the top of the stairs and the bottom of the stairs. If you do not have them, ask someone to add them for you. What else can I do to help prevent falls? Wear shoes that: Do not have high heels. Have rubber bottoms. Are comfortable and fit you well. Are closed at the toe. Do not wear sandals. If you use a stepladder: Make sure that it is fully opened. Do not climb a closed stepladder. Make sure that both sides of the stepladder are locked into place. Ask someone to hold it for you, if possible. Clearly mark and make sure that you can see: Any grab bars or handrails. First and last steps. Where the edge of each step is. Use tools that help you move around (mobility aids) if they are needed. These include: Canes. Walkers. Scooters. Crutches. Turn on the lights when you go into a dark area. Replace any light bulbs as soon as they burn out. Set up your furniture so you have a clear path. Avoid moving your furniture around. If any of your floors are uneven, fix them. If there are any pets around you, be aware of where they are. Review your medicines with your doctor. Some medicines can make you feel dizzy. This can increase your chance of falling. Ask your doctor what other things that you can do to help prevent falls. This information is not intended to replace advice given to you by your health care provider. Make sure you discuss any questions you have with your health care provider. Document Released: 12/28/2008 Document Revised: 08/09/2015 Document Reviewed: 04/07/2014 Elsevier Interactive Patient Education  2017 [employer].

## 2022-03-25 NOTE — Progress Notes (Signed)
I connected with [name] [name] today by telephone and verified that I am speaking with the correct person using two identifiers. Location patient: home Location provider: work Persons participating in the virtual visit: Cypher Paule, Glenna Durand LPN.   I discussed the limitations, risks, security and privacy concerns of performing an evaluation and management service by telephone and the availability of in person appointments. I also discussed with the patient that there may be a patient responsible charge related to this service. The patient expressed understanding and verbally consented to this telephonic visit.    Interactive audio and video telecommunications were attempted between this provider and patient, however failed, due to patient having technical difficulties OR patient did not have access to video capability.  We continued and completed visit with audio only.     Vital signs may be patient reported or missing.  Subjective:   [name] [name] is a 72 y.o. male who presents for Medicare Annual/Subsequent preventive examination.  Review of Systems     Cardiac Risk Factors include: advanced age (>64mn, >>86women);diabetes mellitus;dyslipidemia;hypertension;male gender;obesity (BMI >30kg/m2)     Objective:    Today's Vitals   03/25/22 1131  Weight: 231 lb (104.8 kg)  Height: 5' 10.5" (1.791 m)   Body mass index is 32.68 kg/m.     03/25/2022   11:37 AM 03/22/2021   11:19 AM 02/14/2020   10:11 AM 11/01/2019    7:38 PM 11/01/2019    4:16 PM 09/30/2018   11:49 AM 06/24/2016   11:52 AM  Advanced Directives  Does Patient Have a Medical Advance Directive? Yes Yes Yes Yes Yes Yes Yes  Type of AParamedicof APurdinLiving will HForest AcresLiving will Living will [place]uxoraLiving will [place]Central LakeLiving will [place]Waynes[place]Living will   Does patient want to make changes to medical advance  directive?   No - Patient declined No - Patient declined  Yes (MAU/Ambulatory/Procedural Areas - Information given) Yes (Inpatient - patient defers changing a medical advance directive at this time)  Copy of [place] Chart? No - copy requested No - copy requested  No - copy requested No - copy requested No - copy requested   Would patient like information on creating a medical advance directive?    No - Patient declined       Current Medications (verified) Outpatient Encounter Medications as of 03/25/2022  Medication Sig   rosuvastatin (CRESTOR) 40 MG tablet Take 1 tablet (40 mg total) by mouth daily.   sildenafil (REVATIO) 20 MG tablet Take 1 to 5 pills as needed daily   Testosterone 20.25 MG/ACT (1.62%) GEL APPLY 3 PUMPS TO SHOULDER AND ARM DAILY   valsartan-hydrochlorothiazide (DIOVAN-HCT) 320-12.5 MG tablet Take 1 tablet by mouth daily.   Accu-Chek FastClix Lancets MISC 1 each by Does not apply route daily.   amLODipine (NORVASC) 10 MG tablet Take 1 tablet (10 mg total) by mouth daily.   EPINEPHrine (EPI-PEN) 0.3 mg/0.3 mL DEVI Inject 0.3 mLs (0.3 mg total) into the muscle once. (Patient not taking: Reported on 10/09/2021)   fish oil-omega-3 fatty acids 1000 MG capsule Take 2 g by mouth daily.   Glucosamine-Chondroit-Vit C-Mn (GLUCOSAMINE 1500 COMPLEX) CAPS Take 1 capsule by mouth daily.    glucose blood (ACCU-CHEK GUIDE) test strip Use one daily to check blood sugar   Multiple Vitamins-Minerals (MULTIVITAMIN WITH MINERALS) tablet Take 1 tablet by mouth daily.   No facility-administered encounter medications  on file as of 03/25/2022.    Allergies (verified) Yellow jacket venom   History: Past Medical History:  Diagnosis Date   Dyslipidemia    ED (erectile dysfunction)    Hyperlipidemia    Hyperparathyroidism    Hypertension    Hypogonadism, male    Sleep apnea    Past Surgical History:  Procedure Laterality Date   PARATHYROIDECTOMY     SKIN BIOPSY Left  03/02/2019   squamous cell carcinoma in situ, hypertrophic, traumatized   SKIN BIOPSY Right 08/01/2021   residual squamous cell caarcinoma   Family History  Problem Relation Age of Onset   Hypertension Father    Hyperlipidemia Father    Social History   Socioeconomic History   Marital status: Married    Spouse name: Not on file   Number of children: Not on file   Years of education: Not on file   Highest education level: Not on file  Occupational History   Not on file  Tobacco Use   Smoking status: Never   Smokeless tobacco: Never  Vaping Use   Vaping Use: Never used  Substance and Sexual Activity   Alcohol use: Yes    Alcohol/week: 14.0 standard drinks of alcohol    Types: 14 Cans of beer per week   Drug use: No   Sexual activity: Yes  Other Topics Concern   Not on file  Social History Narrative   Not on file   Social Determinants of Health   Financial Resource Strain: Low Risk  (03/25/2022)   Overall Financial Resource Strain (CARDIA)    Difficulty of Paying Living Expenses: Not hard at all  Food Insecurity: No Food Insecurity (03/25/2022)   Hunger Vital Sign    Worried About Running Out of Food in the Last Year: Never true    Ran Out of Food in the Last Year: Never true  Transportation Needs: No Transportation Needs (03/25/2022)   PRAPARE - Hydrologist (Medical): No    Lack of Transportation (Non-Medical): No  Physical Activity: Sufficiently Active (03/25/2022)   Exercise Vital Sign    Days of Exercise per Week: 4 days    Minutes of Exercise per Session: 50 min  Stress: No Stress Concern Present (03/25/2022)   [place]    Feeling of Stress : Not at all  Social Connections: Not on file    Tobacco Counseling Counseling given: Not Answered   Clinical Intake:  Pre-visit preparation completed: Yes  Pain : No/denies pain     Nutritional Status: BMI > 30   Obese Nutritional Risks: None Diabetes: Yes  How often do you need to have someone help you when you read instructions, pamphlets, or other written materials from your doctor or pharmacy?: 1 - Never  Diabetic? Yes Nutrition Risk Assessment:  Has the patient had any N/V/D within the last 2 months?  No  Does the patient have any non-healing wounds?  No  Has the patient had any unintentional weight loss or weight gain?  No   Diabetes:  Is the patient diabetic?  Yes  If diabetic, was a CBG obtained today?  No  Did the patient bring in their glucometer from home?  No  How often do you monitor your CBG's? Twice monthly.   Financial Strains and Diabetes Management:  Are you having any financial strains with the device, your supplies or your medication? No .  Does the patient want to be  seen by Chronic Care Management for management of their diabetes?  No  Would the patient like to be referred to a Nutritionist or for Diabetic Management?  No   Diabetic Exams:  Diabetic Eye Exam: Completed 02/13/2022 Diabetic Foot Exam: Completed 06/12/2021   Interpreter Needed?: No  Information entered by :: NAllen LPN   Activities of Daily Living    03/25/2022   11:38 AM  In your present state of health, do you have any difficulty performing the following activities:  Hearing? 0  Vision? 0  Difficulty concentrating or making decisions? 0  Walking or climbing stairs? 0  Dressing or bathing? 0  Doing errands, shopping? 0  Preparing Food and eating ? N  Using the Toilet? N  In the past six months, have you accidently leaked urine? N  Do you have problems with loss of bowel control? N  Managing your Medications? N  Managing your Finances? N  Housekeeping or managing your Housekeeping? N    Patient Care Team: Denita Lung, MD as PCP - General (Family Medicine)  Indicate any recent Medical Services you may have received from other than [facility] providers in the past year (date may be  approximate).     Assessment:   This is a routine wellness examination for [name] [name].  Hearing/Vision screen Vision Screening - Comments:: Regular eye exams, [facility]  Dietary issues and exercise activities discussed: Current Exercise Habits: Home exercise routine, Type of exercise: walking, Time (Minutes): 45, Frequency (Times/Week): 4, Weekly Exercise (Minutes/Week): 180   Goals Addressed             This Visit's Progress    Patient Stated       03/25/2022, wants to get weight below 225 pounds       Depression Screen    03/25/2022   11:38 AM 06/12/2021   11:22 AM 03/22/2021   11:21 AM 06/12/2020   11:14 AM 02/14/2020   10:12 AM 10/06/2019   11:42 AM 09/30/2018   11:03 AM  PHQ 2/9 Scores  PHQ - 2 Score 0 0 0 3 0 0 0  PHQ- 9 Score 0          Fall Risk    03/25/2022   11:38 AM 03/22/2021   11:20 AM 02/14/2020   10:11 AM 10/06/2019   11:42 AM 09/30/2018   11:02 AM  Fall Risk   Falls in the past year? 0 0 0 0 0  Number falls in past yr: 0      Injury with Fall? 0      Risk for fall due to : Medication side effect Medication side effect     Follow up Falls prevention discussed;Education provided;Falls evaluation completed Falls evaluation completed;Education provided;Falls prevention discussed       FALL RISK PREVENTION PERTAINING TO THE HOME:  Any stairs in or around the home? Yes  If so, are there any without handrails? No  Home free of loose throw rugs in walkways, pet beds, electrical cords, etc? Yes  Adequate lighting in your home to reduce risk of falls? Yes   ASSISTIVE DEVICES UTILIZED TO PREVENT FALLS:  Life alert? No  Use of a cane, walker or w/c? No  Grab bars in the bathroom? No  Shower chair or bench in shower? No  Elevated toilet seat or a handicapped toilet? No   TIMED UP AND GO:  Was the test performed? No .      Cognitive Function:  03/25/2022   11:39 AM 03/22/2021   11:22 AM  6CIT Screen  What Year? 0 points 0 points  What  month? 0 points 0 points  What time? 0 points 0 points  Count back from 20 0 points 0 points  Months in reverse 0 points 0 points  Repeat phrase 0 points 0 points  Total Score 0 points 0 points    Immunizations Immunization History  Administered Date(s) Administered   Fluad Quad(high Dose 65+) 11/28/2019   Hepatitis A 04/28/2007, 05/31/2007   Hepatitis B 04/28/2007, 05/31/2007, 01/08/2010   Influenza Split 11/16/2011, 12/14/2012   Influenza Whole 12/19/2008   Influenza, High Dose Seasonal PF 11/12/2015, 12/30/2016, 12/29/2017, 11/18/2018   Influenza,inj,Quad PF,6+ Mos 12/29/2013, 11/07/2014   Influenza,inj,quad, With Preservative 12/11/2016   Influenza-Unspecified 01/07/2021, 12/12/2021   Moderna SARS-COV2 Booster Vaccination 10/16/2020   Moderna Sars-Covid-2 Vaccination 04/19/2019, 05/19/2019, 01/31/2020   Pfizer Covid-19 Vaccine Bivalent Booster 4yr & up 03/12/2021   Pneumococcal Conjugate-13 11/12/2015   Pneumococcal Polysaccharide-23 01/17/2011   Respiratory Syncytial Virus Vaccine,Recomb Aduvanted(Arexvy) 12/12/2021   Tdap 04/28/2007, 10/05/2017   Unspecified SARS-COV-2 Vaccination 12/15/2021   Zoster Recombinat (Shingrix) 07/11/2016, 09/29/2016   Zoster, Live 01/17/2011    TDAP status: Up to date  Flu Vaccine status: Up to date  Pneumococcal vaccine status: Up to date  Covid-19 vaccine status: Completed vaccines  Qualifies for Shingles Vaccine? Yes   Zostavax completed Yes   Shingrix Completed?: Yes  Screening Tests Health Maintenance  Topic Date Due   COVID-19 Vaccine (6 - 2023-24 season) 02/09/2022   Medicare Annual Wellness (AWV)  03/22/2022   Pneumonia Vaccine 72 Years old (3 - PPSV23 or PCV20) 07/14/2022 (Originally 11/11/2020)   HEMOGLOBIN A1C  04/11/2022   FOOT EXAM  06/13/2022   Diabetic kidney evaluation - eGFR measurement  10/10/2022   Diabetic kidney evaluation - Urine ACR  10/10/2022   OPHTHALMOLOGY EXAM  02/14/2023   COLONOSCOPY (Pts  [phone] coverage will need to be confirmed)  03/13/2025   DTaP/Tdap/Td (3 - Td or Tdap) 10/06/2027   INFLUENZA VACCINE  Completed   Hepatitis C Screening  Completed   Zoster Vaccines- Shingrix  Completed   HPV VACCINES  Aged Out    Health Maintenance  Health Maintenance Due  Topic Date Due   COVID-19 Vaccine (6 - 2023-24 season) 02/09/2022   Medicare Annual Wellness (AWV)  03/22/2022    Colorectal cancer screening: Type of screening: Colonoscopy. Completed 03/14/2015. Repeat every 10 years  Lung Cancer Screening: (Low Dose CT Chest recommended if Age 72-80ears, 30 pack-year currently smoking OR have quit w/in 15years.) does not qualify.   Lung Cancer Screening Referral: no  Additional Screening:  Hepatitis C Screening: does qualify; Completed 2/2/20217  Vision Screening: Recommended annual ophthalmology exams for early detection of glaucoma and other disorders of the eye. Is the patient up to date with their annual eye exam?  Yes  Who is the provider or what is the name of the office in which the patient attends annual eye exams? [facility] with a provider, would they like to be referred to a provider to establish care? No .   Dental Screening: Recommended annual dental exams for proper oral hygiene  Community Resource Referral / Chronic Care Management: CRR required this visit?  No   CCM required this visit?  No      Plan:     I have personally reviewed and noted the following in the patient's chart:  Medical and social history Use of alcohol, tobacco or illicit drugs  Current medications and supplements including opioid prescriptions. Patient is not currently taking opioid prescriptions. Functional ability and status Nutritional status Physical activity Advanced directives List of other physicians Hospitalizations, surgeries, and ER visits in previous 12 months Vitals Screenings to include cognitive,  depression, and falls Referrals and appointments  In addition, I have reviewed and discussed with patient certain preventive protocols, quality metrics, and best practice recommendations. A written personalized care plan for preventive services as well as general preventive health recommendations were provided to patient.     Kellie Simmering, LPN   0/11/9831   Nurse Notes: none  Due to this being a virtual visit, the after visit summary with patients personalized plan was offered to patient via mail or my-chart.  Patient would like to access on my-chart

## 2022-03-28 ENCOUNTER — Ambulatory Visit: Payer: Medicare Other

## 2022-04-01 DIAGNOSIS — L570 Actinic keratosis: Secondary | ICD-10-CM | POA: Diagnosis not present

## 2022-04-11 ENCOUNTER — Ambulatory Visit: Payer: BLUE CROSS/BLUE SHIELD | Admitting: Family Medicine

## 2022-05-06 DIAGNOSIS — M9904 Segmental and somatic dysfunction of sacral region: Secondary | ICD-10-CM | POA: Diagnosis not present

## 2022-05-06 DIAGNOSIS — M47812 Spondylosis without myelopathy or radiculopathy, cervical region: Secondary | ICD-10-CM | POA: Diagnosis not present

## 2022-05-06 DIAGNOSIS — M9901 Segmental and somatic dysfunction of cervical region: Secondary | ICD-10-CM | POA: Diagnosis not present

## 2022-05-06 DIAGNOSIS — M47816 Spondylosis without myelopathy or radiculopathy, lumbar region: Secondary | ICD-10-CM | POA: Diagnosis not present

## 2022-05-06 DIAGNOSIS — M5388 Other specified dorsopathies, sacral and sacrococcygeal region: Secondary | ICD-10-CM | POA: Diagnosis not present

## 2022-05-06 DIAGNOSIS — M9903 Segmental and somatic dysfunction of lumbar region: Secondary | ICD-10-CM | POA: Diagnosis not present

## 2022-05-13 ENCOUNTER — Ambulatory Visit (INDEPENDENT_AMBULATORY_CARE_PROVIDER_SITE_OTHER): Payer: Medicare Other | Admitting: Family Medicine

## 2022-05-13 ENCOUNTER — Encounter: Payer: Self-pay | Admitting: Family Medicine

## 2022-05-13 VITALS — BP 138/84 | HR 72 | Temp 98.3°F | Resp 17 | Wt 236.4 lb

## 2022-05-13 DIAGNOSIS — I152 Hypertension secondary to endocrine disorders: Secondary | ICD-10-CM

## 2022-05-13 DIAGNOSIS — Z8639 Personal history of other endocrine, nutritional and metabolic disease: Secondary | ICD-10-CM

## 2022-05-13 DIAGNOSIS — E1159 Type 2 diabetes mellitus with other circulatory complications: Secondary | ICD-10-CM

## 2022-05-13 DIAGNOSIS — G4730 Sleep apnea, unspecified: Secondary | ICD-10-CM

## 2022-05-13 DIAGNOSIS — N529 Male erectile dysfunction, unspecified: Secondary | ICD-10-CM

## 2022-05-13 DIAGNOSIS — E1121 Type 2 diabetes mellitus with diabetic nephropathy: Secondary | ICD-10-CM

## 2022-05-13 DIAGNOSIS — E1169 Type 2 diabetes mellitus with other specified complication: Secondary | ICD-10-CM

## 2022-05-13 DIAGNOSIS — E119 Type 2 diabetes mellitus without complications: Secondary | ICD-10-CM | POA: Diagnosis not present

## 2022-05-13 DIAGNOSIS — E291 Testicular hypofunction: Secondary | ICD-10-CM | POA: Diagnosis not present

## 2022-05-13 DIAGNOSIS — E785 Hyperlipidemia, unspecified: Secondary | ICD-10-CM

## 2022-05-13 DIAGNOSIS — E669 Obesity, unspecified: Secondary | ICD-10-CM

## 2022-05-13 LAB — POCT GLYCOSYLATED HEMOGLOBIN (HGB A1C): Hemoglobin A1C: 9.5 % — AB (ref 4.0–5.6)

## 2022-05-13 NOTE — Progress Notes (Signed)
Subjective:    Patient ID: DAYTON ROPP, male    DOB: 11/06/1950, 72 y.o.   MRN: [id number]  RHONAN SCOBEY is a 72 y.o. male who presents for follow-up of Type 2 diabetes mellitus.  Home blood sugar records:  none Current symptoms/problems include none and have been stable. Daily foot checks: yes   Any foot concerns: none How often blood sugars checked: none Exercise: Home exercise routine includes walking. Diet: cut out carbs, focuses on healthy diet He continues on Crestor, amlodipine, valsartan/HCTZ and is also using testosterone regularly.  He does have a CPAP but I do not have a readout.  He does get good results with using the CPAP. The following portions of the patient's history were reviewed and updated as appropriate: allergies, current medications, past medical history, past social history and problem list.  ROS as in subjective above.     Objective:    Physical Exam Alert and in no distress otherwise not examined. Hemoglobin A1c is 9.5. Blood pressure 138/84, pulse 72, temperature 98.3 F (36.8 C), temperature source Oral, resp. rate 17, weight 236 lb 6.4 oz (107.2 kg), SpO2 95 %.  Lab Review    Latest Ref Rng & Units 05/13/2022   11:43 AM 10/09/2021   11:39 AM 10/09/2021   11:38 AM 10/09/2021   11:22 AM 06/12/2021   12:51 PM  Diabetic Labs  HbA1c 4.0 - 5.6 % 9.5   6.9   6.5   Microalbumin mg/L  50.3      Micro/Creat Ratio   35.2      Chol 100 - 199 mg/dL    187    HDL >39 mg/dL    46    Calc LDL 0 - 99 mg/dL    122    Triglycerides 0 - 149 mg/dL    104    Creatinine 0.76 - 1.27 mg/dL    1.26        05/13/2022   11:27 AM 03/25/2022   11:31 AM 10/09/2021   10:21 AM 06/12/2021   11:25 AM 03/22/2021   11:11 AM  BP/Weight  Systolic BP 0000000  [id number]   Diastolic BP 84  80 76   Wt. (Lbs) 236.4 231 234.4 238.8 235  BMI 33.44 kg/m2 32.68 kg/m2 34.37 kg/m2 33.78 kg/m2 33.24 kg/m2      Latest Ref Rng & Units 02/13/2022   12:00 AM 06/12/2021   11:15 AM  Foot/eye exam  completion dates  Eye Exam No Retinopathy No Retinopathy       Foot Form Completion   Done     This result is from an external source.    Depaul  reports that he has never smoked. He has never used smokeless tobacco. He reports current alcohol use of about 14.0 standard drinks of alcohol per week. He reports that he does not use drugs.     Assessment & Plan:    Hypertension associated with diabetes ([place])  Sleep apnea, unspecified type  Diabetic nephropathy associated with type 2 diabetes mellitus ([place])  Hyperlipidemia associated with type 2 diabetes mellitus ([place])  Hypogonadism male - Plan: Testosterone  Type 2 diabetes mellitus without complication, without long-term current use of insulin ([place]) - Plan: POCT glycosylated hemoglobin (Hb A1C)  Obesity (BMI 30-39.9)  He is to get a readout concerning his CPAP so we can document.  He does get good results with this.  I will also check his testosterone.  He is not interested in any diabetes medications  and plans to make in a concerted effort with diet and exercise to get his blood sugars and A1c under good control.  Recheck here in 4 months.  Recommend that he check the battery in his glucometer or possibly get a new glucometer and again discussed the need for checking his blood sugar eating before a meal or 2 hours after meal

## 2022-05-14 ENCOUNTER — Encounter: Payer: Self-pay | Admitting: Family Medicine

## 2022-05-14 LAB — TESTOSTERONE: Testosterone: 124 ng/dL — ABNORMAL LOW (ref 264–916)

## 2022-05-18 ENCOUNTER — Ambulatory Visit
Admission: RE | Admit: 2022-05-18 | Discharge: 2022-05-18 | Disposition: A | Discharge status: Home / Self Care | Payer: Medicare Other | Source: Ambulatory Visit

## 2022-05-18 VITALS — BP 159/75 | HR 66 | Temp 99.1°F | Resp 18

## 2022-05-18 DIAGNOSIS — H6123 Impacted cerumen, bilateral: Secondary | ICD-10-CM | POA: Diagnosis not present

## 2022-05-18 NOTE — ED Provider Notes (Signed)
[name]    CSN: [id number] Arrival date & time: 05/18/22  1222      History   Chief Complaint Chief Complaint  Patient presents with   Ear Fullness    Check for potential ear infection - Entered by patient    HPI [name] [name] is a 72 y.o. male.    Ear Fullness    Patient presents with complaint of left ear feeling clogged x 5 days.  He is concerned for possible ear infection.  Denies pain.  Denies unusual nasal congestion-endorses chronic allergic symptoms.  Past Medical History:  Diagnosis Date   Dyslipidemia    ED (erectile dysfunction)    Hyperlipidemia    Hyperparathyroidism    Hypertension    Hypogonadism, male    Sleep apnea     Patient Active Problem List   Diagnosis Date Noted   Diabetic nephropathy associated with type 2 diabetes mellitus ([place]) 10/09/2021   Squamous cell carcinoma in situ (SCCIS) of skin of chest 08/21/2021   Type 2 diabetes mellitus without complication, without long-term current use of insulin ([place]) 10/16/2020   Aortic atherosclerosis ([place]) 10/16/2020   History of GI diverticular bleed 02/14/2020   Diverticulosis 11/03/2019   Spinal stenosis 10/06/2019   Hypertension associated with diabetes ([place]) 01/17/2011   Hyperlipidemia associated with type 2 diabetes mellitus ([place]) 01/17/2011   Hypogonadism male 01/17/2011   Sleep apnea 01/17/2011   History of hyperparathyroidism 01/17/2011   ED (erectile dysfunction) 01/17/2011   Obesity (BMI 30-39.9) 01/17/2011    Past Surgical History:  Procedure Laterality Date   PARATHYROIDECTOMY     SKIN BIOPSY Left 03/02/2019   squamous cell carcinoma in situ, hypertrophic, traumatized   SKIN BIOPSY Right 08/01/2021   residual squamous cell caarcinoma       Home Medications    Prior to Admission medications   Medication Sig Start Date End Date Taking? Authorizing Provider  Accu-Chek FastClix Lancets MISC 1 each by Does not apply route daily. 10/16/20   Denita Lung, MD   amLODipine (NORVASC) 10 MG tablet Take 1 tablet (10 mg total) by mouth daily. 10/09/21   Denita Lung, MD  aspirin EC 325 MG tablet Take 1 tablet every day by oral route.    [provider]  EPINEPHrine (EPI-PEN) 0.3 mg/0.3 mL DEVI Inject 0.3 mLs (0.3 mg total) into the muscle once. Patient not taking: Reported on 10/09/2021 03/14/14   Denita Lung, MD  fish oil-omega-3 fatty acids 1000 MG capsule Take 2 g by mouth daily.    [provider]  gabapentin (NEURONTIN) 300 MG capsule SMARTSIG:1 Capsule(s) By Mouth 2-3 Times Daily PRN    [provider]  Glucosamine-Chondroit-Vit C-Mn (GLUCOSAMINE 1500 COMPLEX) CAPS Take 1 capsule by mouth daily.     [provider]  glucose blood (ACCU-CHEK GUIDE) test strip Use one daily to check blood sugar 10/16/20   Denita Lung, MD  Multiple Vitamins-Minerals (MULTIVITAMIN WITH MINERALS) tablet Take 1 tablet by mouth daily.    [provider]  naproxen (NAPROSYN) 500 MG tablet TAKE 1 TABLET BY MOUTH TWICE A DAY AS NEEDED TAKE WITH MEAL    [provider]  rosuvastatin (CRESTOR) 40 MG tablet Take 1 tablet (40 mg total) by mouth daily. Patient not taking: Reported on 05/13/2022 10/10/21   Denita Lung, MD  sildenafil (REVATIO) 20 MG tablet Take 1 to 5 pills as needed daily Patient not taking: Reported on 05/13/2022 10/09/21   Denita Lung,  MD  Testosterone 20.25 MG/ACT (1.62%) GEL APPLY 3 PUMPS TO SHOULDER AND ARM DAILY 10/09/21   Denita Lung, MD  valsartan-hydrochlorothiazide (DIOVAN-HCT) 320-12.5 MG tablet Take 1 tablet by mouth daily. 10/09/21   Denita Lung, MD    Family History Family History  Problem Relation Age of Onset   Hypertension Father    Hyperlipidemia Father     Social History Social History   Tobacco Use   Smoking status: Never   Smokeless tobacco: Never  Vaping Use   Vaping Use: Never used  Substance Use Topics   Alcohol use: Yes    Alcohol/week: 14.0 standard  drinks of alcohol    Types: 14 Cans of beer per week   Drug use: No     Allergies   Yellow jacket venom   Review of Systems Review of Systems   Physical Exam Triage Vital Signs ED Triage Vitals  Enc Vitals Group     BP 05/18/22 1249 (!) 159/75     Pulse Rate 05/18/22 1249 66     Resp 05/18/22 1249 18     Temp 05/18/22 1249 99.1 F (37.3 C)     Temp Source 05/18/22 1249 Oral     SpO2 05/18/22 1249 97 %     Weight --      Height --      Head Circumference --      Peak Flow --      Pain Score 05/18/22 1250 0     Pain Loc --      Pain Edu? --      Excl. in [place]? --    No data found.  Updated Vital Signs BP (!) 159/75 (BP Location: Left Arm)   Pulse 66   Temp 99.1 F (37.3 C) (Oral)   Resp 18   SpO2 97%   Visual Acuity Right Eye Distance:   Left Eye Distance:   Bilateral Distance:    Right Eye Near:   Left Eye Near:    Bilateral Near:     Physical Exam Vitals reviewed.  Constitutional:      Appearance: Normal appearance.  HENT:     Left Ear: There is impacted cerumen.  Skin:    General: Skin is warm and dry.  Neurological:     General: No focal deficit present.     Mental Status: He is alert and oriented to person, place, and time.  Psychiatric:        Mood and Affect: Mood normal.        Behavior: Behavior normal.      UC Treatments / Results  Labs (all labs ordered are listed, but only abnormal results are displayed) Labs Reviewed - No data to display  EKG   Radiology No results found.  Procedures Ear Cerumen Removal  Date/Time: 05/18/2022 1:16 PM  Performed by: Rose Phi, FNP Authorized by: Rose Phi, FNP   Consent:    Consent obtained:  Verbal   Consent given by:  Patient   Risks discussed:  Bleeding, infection, pain and incomplete removal   Alternatives discussed:  No treatment Universal protocol:    Procedure explained and questions answered to patient or proxy's satisfaction: yes     Relevant documents  present and verified: yes     Test results available: no     Imaging studies available: no     Required blood products, implants, devices, and special equipment available: no     Site/side marked: no  Immediately prior to procedure, a time out was called: yes     Patient identity confirmed:  Verbally with patient Procedure details:    Location:  L ear and R ear   Procedure type: irrigation     Procedure outcomes: cerumen removed   Post-procedure details:    Inspection:  Ear canal clear and macerated skin (R ear completely cleared; L ear)   Procedure completion:  Tolerated well, no immediate complications Comments:     Recommended occasional use of peroxide to prevent infection.  (including critical care time)  Medications Ordered in UC Medications - No data to display  Initial Impression / Assessment and Plan / UC Course  I have reviewed the triage vital signs and the nursing notes.  Pertinent labs & imaging results that were available during my care of the patient were reviewed by me and considered in my medical decision making (see chart for details).   EACs totally occluded by cerumen impaction.  Carbamide peroxide (Debrox) ointment is instilled and irrigation performed.  See procedure note.  Final Clinical Impressions(s) / UC Diagnoses   Final diagnoses:  Cerumen debris on tympanic membrane of left ear     Discharge Instructions      Follow up with your PCP or ENT provider.      ED Prescriptions   None    PDMP not reviewed this encounter.   Rose Phi, [place] 05/18/22 1451

## 2022-05-18 NOTE — ED Triage Notes (Signed)
Pt states his left ear feels clogged x 5 days.

## 2022-05-18 NOTE — Discharge Instructions (Addendum)
Follow up here or with your primary care provider if your symptoms are worsening or not improving with treatment.          

## 2022-05-20 ENCOUNTER — Encounter: Payer: Self-pay | Admitting: Family Medicine

## 2022-05-21 DIAGNOSIS — M9904 Segmental and somatic dysfunction of sacral region: Secondary | ICD-10-CM | POA: Diagnosis not present

## 2022-05-21 DIAGNOSIS — M5388 Other specified dorsopathies, sacral and sacrococcygeal region: Secondary | ICD-10-CM | POA: Diagnosis not present

## 2022-05-21 DIAGNOSIS — M9901 Segmental and somatic dysfunction of cervical region: Secondary | ICD-10-CM | POA: Diagnosis not present

## 2022-05-21 DIAGNOSIS — M47816 Spondylosis without myelopathy or radiculopathy, lumbar region: Secondary | ICD-10-CM | POA: Diagnosis not present

## 2022-05-21 DIAGNOSIS — M47812 Spondylosis without myelopathy or radiculopathy, cervical region: Secondary | ICD-10-CM | POA: Diagnosis not present

## 2022-05-21 DIAGNOSIS — M9903 Segmental and somatic dysfunction of lumbar region: Secondary | ICD-10-CM | POA: Diagnosis not present

## 2022-06-17 DIAGNOSIS — D492 Neoplasm of unspecified behavior of bone, soft tissue, and skin: Secondary | ICD-10-CM | POA: Diagnosis not present

## 2022-06-17 DIAGNOSIS — C44629 Squamous cell carcinoma of skin of left upper limb, including shoulder: Secondary | ICD-10-CM | POA: Diagnosis not present

## 2022-06-17 DIAGNOSIS — L570 Actinic keratosis: Secondary | ICD-10-CM | POA: Diagnosis not present

## 2022-06-17 DIAGNOSIS — L578 Other skin changes due to chronic exposure to nonionizing radiation: Secondary | ICD-10-CM | POA: Diagnosis not present

## 2022-06-17 DIAGNOSIS — D0460 Carcinoma in situ of skin of unspecified upper limb, including shoulder: Secondary | ICD-10-CM | POA: Insufficient documentation

## 2022-06-19 DIAGNOSIS — M9901 Segmental and somatic dysfunction of cervical region: Secondary | ICD-10-CM | POA: Diagnosis not present

## 2022-06-19 DIAGNOSIS — M9904 Segmental and somatic dysfunction of sacral region: Secondary | ICD-10-CM | POA: Diagnosis not present

## 2022-06-19 DIAGNOSIS — M47816 Spondylosis without myelopathy or radiculopathy, lumbar region: Secondary | ICD-10-CM | POA: Diagnosis not present

## 2022-06-19 DIAGNOSIS — M47812 Spondylosis without myelopathy or radiculopathy, cervical region: Secondary | ICD-10-CM | POA: Diagnosis not present

## 2022-06-19 DIAGNOSIS — M5388 Other specified dorsopathies, sacral and sacrococcygeal region: Secondary | ICD-10-CM | POA: Diagnosis not present

## 2022-06-19 DIAGNOSIS — M9903 Segmental and somatic dysfunction of lumbar region: Secondary | ICD-10-CM | POA: Diagnosis not present

## 2022-06-29 ENCOUNTER — Other Ambulatory Visit: Payer: Self-pay | Admitting: Family Medicine

## 2022-06-29 DIAGNOSIS — E291 Testicular hypofunction: Secondary | ICD-10-CM

## 2022-07-08 ENCOUNTER — Telehealth: Payer: Self-pay | Admitting: Family Medicine

## 2022-07-08 NOTE — Telephone Encounter (Signed)
Pt has applied for life insurance thru employer. They are requesting form be completed prior to accepting policy. Form sent to back with form checklist attached.

## 2022-07-08 NOTE — Telephone Encounter (Signed)
ParaMeds request for medical records forwarded to [facility]

## 2022-07-21 DIAGNOSIS — M47816 Spondylosis without myelopathy or radiculopathy, lumbar region: Secondary | ICD-10-CM | POA: Diagnosis not present

## 2022-07-21 DIAGNOSIS — M9904 Segmental and somatic dysfunction of sacral region: Secondary | ICD-10-CM | POA: Diagnosis not present

## 2022-07-21 DIAGNOSIS — M9901 Segmental and somatic dysfunction of cervical region: Secondary | ICD-10-CM | POA: Diagnosis not present

## 2022-07-21 DIAGNOSIS — M5388 Other specified dorsopathies, sacral and sacrococcygeal region: Secondary | ICD-10-CM | POA: Diagnosis not present

## 2022-07-21 DIAGNOSIS — M9903 Segmental and somatic dysfunction of lumbar region: Secondary | ICD-10-CM | POA: Diagnosis not present

## 2022-07-21 DIAGNOSIS — M47812 Spondylosis without myelopathy or radiculopathy, cervical region: Secondary | ICD-10-CM | POA: Diagnosis not present

## 2022-08-06 ENCOUNTER — Encounter: Payer: Self-pay | Admitting: Specialist

## 2022-08-06 NOTE — Progress Notes (Unsigned)
No chief complaint on file.     Has appt with [facility] in July

## 2022-08-07 ENCOUNTER — Inpatient Hospital Stay (HOSPITAL_COMMUNITY)
Admission: EM | Admit: 2022-08-07 | Discharge: 2022-08-09 | DRG: 378 | Disposition: A | Discharge status: Home / Self Care | Payer: Medicare Other | Attending: Internal Medicine | Admitting: Internal Medicine

## 2022-08-07 ENCOUNTER — Encounter: Payer: Self-pay | Admitting: Family Medicine

## 2022-08-07 ENCOUNTER — Other Ambulatory Visit: Payer: Self-pay

## 2022-08-07 ENCOUNTER — Ambulatory Visit (INDEPENDENT_AMBULATORY_CARE_PROVIDER_SITE_OTHER): Payer: Medicare Other | Admitting: Family Medicine

## 2022-08-07 ENCOUNTER — Encounter (HOSPITAL_COMMUNITY): Payer: Self-pay

## 2022-08-07 VITALS — BP 120/82 | HR 72 | Temp 99.1°F | Wt 226.4 lb

## 2022-08-07 DIAGNOSIS — E785 Hyperlipidemia, unspecified: Secondary | ICD-10-CM | POA: Diagnosis not present

## 2022-08-07 DIAGNOSIS — Z6832 Body mass index (BMI) 32.0-32.9, adult: Secondary | ICD-10-CM

## 2022-08-07 DIAGNOSIS — D62 Acute posthemorrhagic anemia: Secondary | ICD-10-CM | POA: Diagnosis not present

## 2022-08-07 DIAGNOSIS — E876 Hypokalemia: Secondary | ICD-10-CM | POA: Diagnosis present

## 2022-08-07 DIAGNOSIS — E1122 Type 2 diabetes mellitus with diabetic chronic kidney disease: Secondary | ICD-10-CM | POA: Diagnosis not present

## 2022-08-07 DIAGNOSIS — K922 Gastrointestinal hemorrhage, unspecified: Secondary | ICD-10-CM | POA: Diagnosis present

## 2022-08-07 DIAGNOSIS — I129 Hypertensive chronic kidney disease with stage 1 through stage 4 chronic kidney disease, or unspecified chronic kidney disease: Secondary | ICD-10-CM | POA: Diagnosis present

## 2022-08-07 DIAGNOSIS — E1165 Type 2 diabetes mellitus with hyperglycemia: Secondary | ICD-10-CM | POA: Diagnosis present

## 2022-08-07 DIAGNOSIS — K625 Hemorrhage of anus and rectum: Principal | ICD-10-CM

## 2022-08-07 DIAGNOSIS — Z79899 Other long term (current) drug therapy: Secondary | ICD-10-CM

## 2022-08-07 DIAGNOSIS — E119 Type 2 diabetes mellitus without complications: Secondary | ICD-10-CM

## 2022-08-07 DIAGNOSIS — E669 Obesity, unspecified: Secondary | ICD-10-CM | POA: Diagnosis not present

## 2022-08-07 DIAGNOSIS — N1831 Chronic kidney disease, stage 3a: Secondary | ICD-10-CM | POA: Diagnosis not present

## 2022-08-07 DIAGNOSIS — G4733 Obstructive sleep apnea (adult) (pediatric): Secondary | ICD-10-CM | POA: Diagnosis present

## 2022-08-07 DIAGNOSIS — K5791 Diverticulosis of intestine, part unspecified, without perforation or abscess with bleeding: Secondary | ICD-10-CM | POA: Diagnosis not present

## 2022-08-07 DIAGNOSIS — Z8249 Family history of ischemic heart disease and other diseases of the circulatory system: Secondary | ICD-10-CM | POA: Diagnosis not present

## 2022-08-07 DIAGNOSIS — Z83438 Family history of other disorder of lipoprotein metabolism and other lipidemia: Secondary | ICD-10-CM | POA: Diagnosis not present

## 2022-08-07 DIAGNOSIS — K921 Melena: Secondary | ICD-10-CM | POA: Diagnosis not present

## 2022-08-07 LAB — CBC WITH DIFFERENTIAL/PLATELET
Abs Immature Granulocytes: 0.05 10*3/uL (ref 0.00–0.07)
Basophils Absolute: 0.1 10*3/uL (ref 0.0–0.1)
Basophils Relative: 1 %
Eosinophils Absolute: 0.1 10*3/uL (ref 0.0–0.5)
Eosinophils Relative: 2 %
HCT: 26.5 % — ABNORMAL LOW (ref 39.0–52.0)
Hemoglobin: 8.5 g/dL — ABNORMAL LOW (ref 13.0–17.0)
Immature Granulocytes: 1 %
Lymphocytes Relative: 17 %
Lymphs Abs: 1.4 10*3/uL (ref 0.7–4.0)
MCH: 29.4 pg (ref 26.0–34.0)
MCHC: 32.1 g/dL (ref 30.0–36.0)
MCV: 91.7 fL (ref 80.0–100.0)
Monocytes Absolute: 0.6 10*3/uL (ref 0.1–1.0)
Monocytes Relative: 8 %
Neutro Abs: 6.1 10*3/uL (ref 1.7–7.7)
Neutrophils Relative %: 71 %
Platelets: 198 10*3/uL (ref 150–400)
RBC: 2.89 MIL/uL — ABNORMAL LOW (ref 4.22–5.81)
RDW: 13.6 % (ref 11.5–15.5)
WBC: 8.4 10*3/uL (ref 4.0–10.5)
nRBC: 0 % (ref 0.0–0.2)

## 2022-08-07 LAB — BASIC METABOLIC PANEL
Anion gap: 10 (ref 5–15)
BUN: 18 mg/dL (ref 8–23)
CO2: 27 mmol/L (ref 22–32)
Calcium: 9.3 mg/dL (ref 8.9–10.3)
Chloride: 99 mmol/L (ref 98–111)
Creatinine, Ser: 1.34 mg/dL — ABNORMAL HIGH (ref 0.61–1.24)
GFR, Estimated: 57 mL/min — ABNORMAL LOW (ref >60)
Glucose, Bld: 277 mg/dL — ABNORMAL HIGH (ref 70–99)
Potassium: 2.7 mmol/L — CL (ref 3.5–5.1)
Sodium: 136 mmol/L (ref 135–145)

## 2022-08-07 LAB — I-STAT CHEM 8, ED
BUN: 16 mg/dL (ref 8–23)
Calcium, Ion: 1.27 mmol/L (ref 1.15–1.40)
Chloride: 96 mmol/L — ABNORMAL LOW (ref 98–111)
Creatinine, Ser: 1.4 mg/dL — ABNORMAL HIGH (ref 0.61–1.24)
Glucose, Bld: 270 mg/dL — ABNORMAL HIGH (ref 70–99)
HCT: 25 % — ABNORMAL LOW (ref 39.0–52.0)
Hemoglobin: 8.5 g/dL — ABNORMAL LOW (ref 13.0–17.0)
Potassium: 2.8 mmol/L — ABNORMAL LOW (ref 3.5–5.1)
Sodium: 137 mmol/L (ref 135–145)
TCO2: 30 mmol/L (ref 22–32)

## 2022-08-07 LAB — PREPARE RBC (CROSSMATCH)

## 2022-08-07 LAB — HEMOGLOBIN AND HEMATOCRIT, BLOOD
HCT: 24.3 % — ABNORMAL LOW (ref 39.0–52.0)
Hemoglobin: 7.8 g/dL — ABNORMAL LOW (ref 13.0–17.0)

## 2022-08-07 LAB — CBG MONITORING, ED: Glucose-Capillary: 221 mg/dL — ABNORMAL HIGH (ref 70–99)

## 2022-08-07 LAB — BPAM RBC: Unit Type and Rh: 6200

## 2022-08-07 LAB — POCT HEMOGLOBIN: Hemoglobin: 7.2 g/dL — AB (ref 11–14.6)

## 2022-08-07 LAB — MAGNESIUM: Magnesium: 1.9 mg/dL (ref 1.7–2.4)

## 2022-08-07 LAB — POCT GLYCOSYLATED HEMOGLOBIN (HGB A1C): Hemoglobin A1C: 7.5 % — AB (ref 4.0–5.6)

## 2022-08-07 MED ORDER — SODIUM CHLORIDE 0.9% IV SOLUTION: Freq: Once | INTRAVENOUS | Status: AC

## 2022-08-07 MED ORDER — PANTOPRAZOLE SODIUM 40 MG IV SOLR
40.0000 mg | Freq: Two times a day (BID) | INTRAVENOUS | Status: DC
  Administered 2022-08-07 – 2022-08-09 (×4): 40 mg via INTRAVENOUS
  Filled 2022-08-07 (×4): qty 10

## 2022-08-07 MED ORDER — ROSUVASTATIN CALCIUM 20 MG PO TABS
40.0000 mg | ORAL_TABLET | Freq: Every day | ORAL | Status: DC
  Administered 2022-08-07 – 2022-08-09 (×3): 40 mg via ORAL
  Filled 2022-08-07 (×3): qty 2

## 2022-08-07 MED ORDER — ACETAMINOPHEN 325 MG PO TABS: 650.0000 mg | ORAL_TABLET | Freq: Four times a day (QID) | ORAL | Status: DC | PRN

## 2022-08-07 MED ORDER — POTASSIUM CHLORIDE CRYS ER 20 MEQ PO TBCR
40.0000 meq | EXTENDED_RELEASE_TABLET | Freq: Once | ORAL | Status: AC
  Administered 2022-08-07: 40 meq via ORAL
  Filled 2022-08-07: qty 2

## 2022-08-07 MED ORDER — POTASSIUM CHLORIDE 2 MEQ/ML IV SOLN
INTRAVENOUS | Status: AC
  Filled 2022-08-07 (×4): qty 1000

## 2022-08-07 MED ORDER — MELATONIN 5 MG PO TABS: 5.0000 mg | ORAL_TABLET | Freq: Every evening | ORAL | Status: DC | PRN

## 2022-08-07 MED ORDER — PROCHLORPERAZINE EDISYLATE 10 MG/2ML IJ SOLN: 5.0000 mg | Freq: Four times a day (QID) | INTRAMUSCULAR | Status: DC | PRN

## 2022-08-07 MED ORDER — ORAL CARE MOUTH RINSE: 15.0000 mL | OROMUCOSAL | Status: DC | PRN

## 2022-08-07 MED ORDER — CHLORHEXIDINE GLUCONATE CLOTH 2 % EX PADS: 6.0000 | MEDICATED_PAD | Freq: Every day | CUTANEOUS | Status: DC

## 2022-08-07 MED ORDER — INSULIN ASPART 100 UNIT/ML IJ SOLN
0.0000 [IU] | Freq: Every day | INTRAMUSCULAR | Status: DC
  Administered 2022-08-07: 2 [IU] via SUBCUTANEOUS
  Filled 2022-08-07: qty 0.05

## 2022-08-07 MED ORDER — INSULIN ASPART 100 UNIT/ML IJ SOLN
0.0000 [IU] | Freq: Three times a day (TID) | INTRAMUSCULAR | Status: DC
  Administered 2022-08-08: 3 [IU] via SUBCUTANEOUS
  Administered 2022-08-08: 2 [IU] via SUBCUTANEOUS
  Administered 2022-08-09: 1 [IU] via SUBCUTANEOUS
  Filled 2022-08-07: qty 0.09

## 2022-08-07 NOTE — ED Notes (Signed)
Blood consent form signed by patient.

## 2022-08-07 NOTE — ED Triage Notes (Signed)
Pt arrives POV, sent from PCP office for possible GI bleed. Pt has blood in his stool, hx of GI bleed

## 2022-08-07 NOTE — ED Notes (Signed)
Pt sent from PCP with GI Bleed, has history, + bloody stools.

## 2022-08-07 NOTE — Consult Note (Signed)
Referring Provider: Dr. Adela Lank Primary Care Physician:  [name], MD Primary Gastroenterologist:  Dr. Randa Evens  Reason for Consultation:  Rectal bleeding  HPI: [name] [name] is a 72 y.o. male reports large amount of bright red blood per rectum for the past two Sundays and denies any bleeding the other days in between. On those two Sundays he had profuse red blood with loose stools without any associated abdominal pain. Had loose dark brown to black stools other days and reports being on iron pills. Felt dizzy and weak prior this week and was sent to ER after PCP found drop in Hgb. Hgb 7.2 (15.6 in July 2023). Denies vomiting. Colonoscopy in 2016 showed left-sided diverticulosis. History of diverticular bleeding in past last admission for it in 2021.  Past Medical History:  Diagnosis Date   Dyslipidemia    ED (erectile dysfunction)    Hyperlipidemia    Hyperparathyroidism    Hypertension    Hypogonadism, male    Sleep apnea     Past Surgical History:  Procedure Laterality Date   PARATHYROIDECTOMY     SKIN BIOPSY Left 03/02/2019   squamous cell carcinoma in situ, hypertrophic, traumatized   SKIN BIOPSY Right 08/01/2021   residual squamous cell caarcinoma    Prior to Admission medications   Medication Sig Start Date End Date Taking? Authorizing Provider  Accu-Chek FastClix Lancets MISC 1 each by Does not apply route daily. 10/16/20   [name], MD  amLODipine (NORVASC) 10 MG tablet Take 1 tablet (10 mg total) by mouth daily. 10/09/21   [name], MD  EPINEPHrine (EPI-PEN) 0.3 mg/0.3 mL DEVI Inject 0.3 mLs (0.3 mg total) into the muscle once. Patient not taking: Reported on 10/09/2021 03/14/14   [name], MD  fish oil-omega-3 fatty acids 1000 MG capsule Take 2 g by mouth daily.    [provider]  Glucosamine-Chondroit-Vit C-Mn (GLUCOSAMINE 1500 COMPLEX) CAPS Take 1 capsule by mouth daily.     [provider]  glucose blood (ACCU-CHEK GUIDE) test  strip Use one daily to check blood sugar 10/16/20   [name], MD  Multiple Vitamins-Minerals (MULTIVITAMIN WITH MINERALS) tablet Take 1 tablet by mouth daily.    [provider]  rosuvastatin (CRESTOR) 40 MG tablet Take 1 tablet (40 mg total) by mouth daily. 10/10/21   [name], MD  sildenafil (REVATIO) 20 MG tablet Take 1 to 5 pills as needed daily Patient not taking: Reported on 05/13/2022 10/09/21   [name], MD  Testosterone 20.25 MG/ACT (1.62%) GEL APPLY 3 PUMPS TO THE SHOULDER AND ARM DAILY 07/01/22   [name], MD  valsartan-hydrochlorothiazide (DIOVAN-HCT) 320-12.5 MG tablet Take 1 tablet by mouth daily. 10/09/21   [name], MD    Scheduled Meds: Continuous Infusions: PRN Meds:.  Allergies as of 08/07/2022 - Review Complete 08/07/2022  Allergen Reaction Noted   Yellow jacket venom Anaphylaxis 03/29/2015    Family History  Problem Relation Age of Onset   Hypertension Father    Hyperlipidemia Father     Social History   Socioeconomic History   Marital status: Married    Spouse name: Not on file   Number of children: Not on file   Years of education: Not on file   Highest education level: Not on file  Occupational History   Not on file  Tobacco Use   Smoking status: Never   Smokeless tobacco: Never  Vaping Use   Vaping Use: Never  used  Substance and Sexual Activity   Alcohol use: Yes    Alcohol/week: 14.0 standard drinks of alcohol    Types: 14 Cans of beer per week   Drug use: No   Sexual activity: Yes  Other Topics Concern   Not on file  Social History Narrative   Not on file   Social Determinants of Health   Financial Resource Strain: Low Risk  (03/25/2022)   Overall Financial Resource Strain (CARDIA)    Difficulty of Paying Living Expenses: Not hard at all  Food Insecurity: No Food Insecurity (03/25/2022)   Hunger Vital Sign    Worried About Running Out of Food in the Last Year: Never true    Ran Out of Food in the  Last Year: Never true  Transportation Needs: No Transportation Needs (03/25/2022)   PRAPARE - Administrator, Civil Service (Medical): No    Lack of Transportation (Non-Medical): No  Physical Activity: Sufficiently Active (03/25/2022)   Exercise Vital Sign    Days of Exercise per Week: 4 days    Minutes of Exercise per Session: 50 min  Stress: No Stress Concern Present (03/25/2022)   [employer] of Occupational Health - Occupational Stress Questionnaire    Feeling of Stress : Not at all  Social Connections: Not on file  Intimate Partner Violence: Not on file    Review of Systems: All negative except as stated above in HPI.  Physical Exam: Vital signs: Vitals:   08/07/22 1556 08/07/22 1600  BP:  128/69  Pulse: 76 71  Resp: 14 14  Temp:    SpO2: 100% 98%  T 97.8   General:   Lethargic, Well-developed, well-nourished, pleasant and cooperative in NAD Head: normocephalic, atraumatic Eyes: anicteric sclera ENT: oropharynx clear Neck: supple, nontender Lungs:  Clear throughout to auscultation.   No wheezes, crackles, or rhonchi. No acute distress. Heart:  Regular rate and rhythm; no murmurs, clicks, rubs,  or gallops. Abdomen: soft, nontender, nondistended, +BS  Rectal:  Deferred Ext: no edema  GI:  Lab Results: Recent Labs    08/07/22 1514 08/07/22 1558 08/07/22 1607  WBC  --  8.4  --   HGB 7.2* 8.5* 8.5*  HCT  --  26.5* 25.0*  PLT  --  198  --    BMET Recent Labs    08/07/22 1607  NA 137  K 2.8*  CL 96*  GLUCOSE 270*  BUN 16  CREATININE 1.40*   LFT No results for input(s): "PROT", "ALBUMIN", "AST", "ALT", "ALKPHOS", "BILITOT", "BILIDIR", "IBILI" in the last 72 hours. PT/INR No results for input(s): "LABPROT", "INR" in the last 72 hours.   Studies/Results: No results found.  Impression/Plan: Painless hematochezia likely due to diverticular bleeding but no bleeding since Sunday so doubt a bleeding scan would be helpful unless develops  significant bleeding in hospital. Would manage conservatively and hold off on repeat colonoscopy during this hospitalization unless bleeding recurs and persists. Clear liquid diet ok. Supportive care. Will follow in consultation.    LOS: 0 days   Shirley Friar  08/07/2022, 5:16 PM  Questions please call [phone]

## 2022-08-07 NOTE — H&P (Addendum)
History and Physical  [name] [name] [id number] DOB: 09/11/1950 DOA: 08/07/2022  Referring physician: Dr. Adela Lank, EDP  PCP: [name], MD  Outpatient Specialists: None Patient coming from: Home through his PCPs office.  Chief Complaint: Generalized fatigue, recent bright red blood per rectum, abnormal labs and positive FOBT  HPI: [name] [name] is a 72 y.o. male with medical history significant for diverticular bleeding not on antiplatelet or anticoagulation, obesity, type 2 diabetes, hyperlipidemia, hypertension, who initially presented to his PCPs office due to generalized fatigue.  Reported having bright red blood per rectum on Sunday, 4 days ago, states he bled a lot, eventually the bleeding stopped.  No abdominal pain, fevers, or chills.    For the past few days has been feeling more fatigued.  Denies recurrence of bright red blood since Sunday.  Endorses having dark stools which he attributes to his iron supplement.  Presented to his PCP today because he felt weak.  Had another dark stool bowel movement this morning.  Denies use of NSAIDs, aspirin, or anticoagulation.  In his PCP's office he had a positive FOBT and a hemoglobin of 7.2 K from his baseline of 15 K.  He was referred to the ED for further evaluation.  In the ED, repeat hemoglobin was 8.2 K and he was normotensive.  The patient has not had a bowel movement since being in the ED.  Serial H&H ordered and consent obtained from the patient for PRBCs transfusion if hemoglobin drops further.  Due to concern for possible ongoing GI bleed and possible upper GI bleed, IV Protonix 40 mg twice daily was initiated.  The patient was admitted by [facility], hospitalist service, to stepdown unit as inpatient status.  ED Course: Temperature 97.5.  BP 132/70, pulse 59, respiratory 16, saturation 100% on room air.  Review of Systems: Review of systems as noted in the HPI. All other systems reviewed and are negative.   Past Medical History:   Diagnosis Date   Dyslipidemia    ED (erectile dysfunction)    Hyperlipidemia    Hyperparathyroidism    Hypertension    Hypogonadism, male    Sleep apnea    Past Surgical History:  Procedure Laterality Date   PARATHYROIDECTOMY     SKIN BIOPSY Left 03/02/2019   squamous cell carcinoma in situ, hypertrophic, traumatized   SKIN BIOPSY Right 08/01/2021   residual squamous cell caarcinoma    Social History:  reports that he has never smoked. He has never used smokeless tobacco. He reports current alcohol use of about 14.0 standard drinks of alcohol per week. He reports that he does not use drugs.   Allergies  Allergen Reactions   Yellow Jacket Venom Anaphylaxis    Family History  Problem Relation Age of Onset   Hypertension Father    Hyperlipidemia Father       Prior to Admission medications   Medication Sig Start Date End Date Taking? Authorizing Provider  Accu-Chek FastClix Lancets MISC 1 each by Does not apply route daily. 10/16/20   [name], MD  amLODipine (NORVASC) 10 MG tablet Take 1 tablet (10 mg total) by mouth daily. 10/09/21   [name], MD  EPINEPHrine (EPI-PEN) 0.3 mg/0.3 mL DEVI Inject 0.3 mLs (0.3 mg total) into the muscle once. Patient not taking: Reported on 10/09/2021 03/14/14   [name], MD  fish oil-omega-3 fatty acids 1000 MG capsule Take 2 g by mouth daily.    [provider]  Glucosamine-Chondroit-Vit C-Mn (  GLUCOSAMINE 1500 COMPLEX) CAPS Take 1 capsule by mouth daily.     [provider]  glucose blood (ACCU-CHEK GUIDE) test strip Use one daily to check blood sugar 10/16/20   [name], MD  Multiple Vitamins-Minerals (MULTIVITAMIN WITH MINERALS) tablet Take 1 tablet by mouth daily.    [provider]  rosuvastatin (CRESTOR) 40 MG tablet Take 1 tablet (40 mg total) by mouth daily. 10/10/21   [name], MD  sildenafil (REVATIO) 20 MG tablet Take 1 to 5 pills as needed daily Patient not taking: Reported  on 05/13/2022 10/09/21   [name], MD  Testosterone 20.25 MG/ACT (1.62%) GEL APPLY 3 PUMPS TO THE SHOULDER AND ARM DAILY 07/01/22   [name], MD  valsartan-hydrochlorothiazide (DIOVAN-HCT) 320-12.5 MG tablet Take 1 tablet by mouth daily. 10/09/21   [name], MD    Physical Exam: BP 132/75   Pulse 62   Temp 97.8 F (36.6 C)   Resp 13   SpO2 100%   General: 72 y.o. year-old male well developed well nourished in no acute distress.  Alert and oriented x3. Cardiovascular: Regular rate and rhythm with no rubs or gallops.  No thyromegaly or JVD noted.  No lower extremity edema. 2/4 pulses in all 4 extremities. Respiratory: Clear to auscultation with no wheezes or rales. Good inspiratory effort. Abdomen: Soft nontender nondistended with normal bowel sounds x4 quadrants. Muskuloskeletal: No cyanosis, clubbing or edema noted bilaterally Neuro: CN II-XII intact, strength, sensation, reflexes Skin: No ulcerative lesions noted or rashes Psychiatry: Judgement and insight appear normal. Mood is appropriate for condition and setting          Labs on Admission:  Basic Metabolic Panel: Recent Labs  Lab 08/07/22 1558 08/07/22 1607  NA 136 137  K 2.7* 2.8*  CL 99 96*  CO2 27  --   GLUCOSE 277* 270*  BUN 18 16  CREATININE 1.34* 1.40*  CALCIUM 9.3  --    Liver Function Tests: No results for input(s): "AST", "ALT", "ALKPHOS", "BILITOT", "PROT", "ALBUMIN" in the last 168 hours. No results for input(s): "LIPASE", "AMYLASE" in the last 168 hours. No results for input(s): "AMMONIA" in the last 168 hours. CBC: Recent Labs  Lab 08/07/22 1514 08/07/22 1558 08/07/22 1607  WBC  --  8.4  --   NEUTROABS  --  6.1  --   HGB 7.2* 8.5* 8.5*  HCT  --  26.5* 25.0*  MCV  --  91.7  --   PLT  --  198  --    Cardiac Enzymes: No results for input(s): "CKTOTAL", "CKMB", "CKMBINDEX", "TROPONINI" in the last 168 hours.  BNP (last 3 results) No results for input(s): "BNP" in the last 8760  hours.  ProBNP (last 3 results) No results for input(s): "PROBNP" in the last 8760 hours.  CBG: No results for input(s): "GLUCAP" in the last 168 hours.  Radiological Exams on Admission: No results found.  EKG: I independently viewed the EKG done and my findings are as followed: None available at the time of this visit.  Assessment/Plan Present on Admission:  GI bleed  Principal Problem:   GI bleed  GI bleed, unspecified. Recent hematochezia on Sunday, 4 days ago Acute blood loss anemia and positive FOBT Ongoing dark stools with concern for acute on chronic GI bleed Denies use of NSAIDs, aspirin, or anticoagulation. [facility] consulted N.p.o. after midnight until seen by GI IV Protonix 40 mg twice daily, until seen by GI. Transfuse  to maintain hemoglobin greater than 8.0 in the setting of suspected ongoing GI bleed. Maintain MAP greater than 65  Acute blood loss anemia from recent hematochezia 4 days ago Possible acute on chronic blood loss anemia Symptomatic anemia Serial H&H in place Latest hemoglobin 7.8, hematocrit 24.3. 1 unit PRBCs ordered to be transfused on 08/07/2022 N.p.o. after midnight due to possible endoscopy, defer to GI to decide.  Generalized fatigue, likely secondary to acute blood loss anemia PT assessment after blood transfusion and GI evaluation  Hypokalemia Serum potassium 2.8 Repleted intravenously and orally Continue LR KCl 40 mill equivalent at 100 cc/h x 1 day. Repeat BMP in the morning Check magnesium level  Type 2 diabetes with hyperglycemia Hemoglobin A1c 7.5 on 08/07/22 Heart healthy carb modified diet Start insulin sliding scale.  Hyperlipidemia Resume home Lipitor  Essential hypertension Hold off home oral antihypertensives to avoid hypotension in the setting of likely ongoing GI bleed Maintain MAP greater than 65 Closely monitor vital signs.  CKD 3A, no recent labs to compare Presented with creatinine of 1.34 with GFR  57 Avoid nephrotoxic agents, dehydration and hypotension Continue IV fluid hydration and monitor urine output Repeat BMP in the morning.  Obesity BMI 32 Recommend weight loss outpatient with regular physical activity and healthy dieting.   DVT prophylaxis: SCDs  Code Status: Full code  Family Communication: Updated patient's wife at bedside.  Disposition Plan: Admitted to stepdown unit  Consults called: [facility] consulted  Admission status: Inpatient status.   Status is: Inpatient The patient requires at least 2 midnights for further evaluation and treatment of present condition.   Darlin Drop MD [facility] Pager [phone]  If 7PM-7AM, please contact night-coverage [email or web address] Password [facility]  08/07/2022, 6:34 PM

## 2022-08-07 NOTE — ED Provider Notes (Signed)
[facility]ERGENC[facility] DEPARTMENT AT [facility] Provider Note   CSN: [id number] Arrival date & time: 08/07/22  1531     History  Chief Complaint  Patient presents with   Rectal Bleeding    [name] [name] is a 72 y.o. male.  72 yo M with a chief complaints of bright red blood per rectum.  He has a history of diverticular bleed in the past.  Had a couple episodes over the past couple weeks.  Saw his PCP and had a acute drop in his hemoglobin and was sent here for evaluation.  He denies any abdominal pain denies vomiting denies dark stool.   Rectal Bleeding      Home Medications Prior to Admission medications   Medication Sig Start Date End Date Taking? Authorizing Provider  amLODipine (NORVASC) 10 MG tablet Take 1 tablet (10 mg total) by mouth daily. 10/09/21  Yes [name], MD  EPINEPHrine (EPI-PEN) 0.3 mg/0.3 mL DEVI Inject 0.3 mLs (0.3 mg total) into the muscle once. Patient taking differently: Inject 0.3 mg into the muscle once as needed (for anaphylaxis). 03/14/14  Yes [name], MD  fish oil-omega-3 fatty acids 1000 MG capsule Take 2 g by mouth daily.   Yes [provider]  Glucosamine-Chondroit-Vit C-Mn (GLUCOSAMINE 1500 COMPLEX) CAPS Take 1 capsule by mouth daily.    Yes [provider]  Iron, Ferrous Sulfate, 325 (65 Fe) MG TABS Take 65 mg by mouth daily with breakfast.   Yes [provider]  Multiple Vitamins-Minerals (MULTIVITAMIN WITH MINERALS) tablet Take 1 tablet by mouth daily.   Yes [provider]  PRESCRIPTION MEDICATION CPAP- At bedtime   Yes [provider]  rosuvastatin (CRESTOR) 40 MG tablet Take 1 tablet (40 mg total) by mouth daily. Patient taking differently: Take 40 mg by mouth at bedtime. 10/10/21  Yes [name], MD  Testosterone 20.25 MG/ACT (1.62%) GEL APPLY 3 PUMPS TO THE SHOULDER AND ARM DAILY Patient taking differently: Place 3 Pump onto the skin See admin instructions. APPLY 3  PUMPS TO THE SHOULDER AND ARM DAILY 07/01/22  Yes [name], MD  valsartan-hydrochlorothiazide (DIOVAN-HCT) 320-12.5 MG tablet Take 1 tablet by mouth daily. 10/09/21  Yes [name], MD  Accu-Chek FastClix Lancets MISC 1 each by Does not apply route daily. 10/16/20   [name], MD  glucose blood (ACCU-CHEK GUIDE) test strip Use one daily to check blood sugar 10/16/20   [name], MD  sildenafil (REVATIO) 20 MG tablet Take 1 to 5 pills as needed daily Patient not taking: Reported on 08/07/2022 10/09/21   [name], MD      Allergies    Yellow jacket venom    Review of Systems   Review of Systems  Gastrointestinal:  Positive for hematochezia.    Physical Exam Updated Vital Signs BP 137/85   Pulse 64   Temp 97.8 F (36.6 C)   Resp 15   SpO2 99%  Physical Exam Vitals and nursing note reviewed.  Constitutional:      Appearance: He is well-developed.  HENT:     Head: Normocephalic and atraumatic.  Eyes:     Pupils: Pupils are equal, round, and reactive to light.  Neck:     Vascular: No JVD.  Cardiovascular:     Rate and Rhythm: Normal rate and regular rhythm.     Heart sounds: No murmur heard.    No friction rub. No gallop.  Pulmonary:  Effort: No respiratory distress.     Breath sounds: No wheezing.  Abdominal:     General: There is no distension.     Tenderness: There is no abdominal tenderness. There is no guarding or rebound.  Musculoskeletal:        General: Normal range of motion.     Cervical back: Normal range of motion and neck supple.  Skin:    Coloration: Skin is not pale.     Findings: No rash.  Neurological:     Mental Status: He is alert and oriented to person, place, and time.  Psychiatric:        Behavior: Behavior normal.     ED Results / Procedures / Treatments   Labs (all labs ordered are listed, but only abnormal results are displayed) Labs Reviewed  CBC WITH DIFFERENTIAL/PLATELET - Abnormal; Notable for the following  components:      Result Value   RBC 2.89 (*)    Hemoglobin 8.5 (*)    HCT 26.5 (*)    All other components within normal limits  BASIC METABOLIC PANEL - Abnormal; Notable for the following components:   Potassium 2.7 (*)    Glucose, Bld 277 (*)    Creatinine, Ser 1.34 (*)    GFR, Estimated 57 (*)    All other components within normal limits  HEMOGLOBIN AND HEMATOCRIT, BLOOD - Abnormal; Notable for the following components:   Hemoglobin 7.8 (*)    HCT 24.3 (*)    All other components within normal limits  I-STAT CHEM 8, ED - Abnormal; Notable for the following components:   Potassium 2.8 (*)    Chloride 96 (*)    Creatinine, Ser 1.40 (*)    Glucose, Bld 270 (*)    Hemoglobin 8.5 (*)    HCT 25.0 (*)    All other components within normal limits  HEMOGLOBIN AND HEMATOCRIT, BLOOD  HEMOGLOBIN AND HEMATOCRIT, BLOOD  CBC  COMPREHENSIVE METABOLIC PANEL  MAGNESIUM  PHOSPHORUS  MAGNESIUM  TYPE AND SCREEN    EKG None  Radiology No results found.  Procedures .Critical Care  Performed by: Melene Plan, DO Authorized by: Melene Plan, DO   Critical care provider statement:    Critical care time (minutes):  35   Critical care time was exclusive of:  Separately billable procedures and treating other patients   Critical care was time spent personally by me on the following activities:  Development of treatment plan with patient or surrogate, discussions with consultants, evaluation of patient's response to treatment, examination of patient, ordering and review of laboratory studies, ordering and review of radiographic studies, ordering and performing treatments and interventions, pulse oximetry, re-evaluation of patient's condition and review of old charts   Care discussed with: admitting provider       Medications Ordered in ED Medications  pantoprazole (PROTONIX) injection 40 mg (40 mg Intravenous Given 08/07/22 1902)  prochlorperazine (COMPAZINE) injection 5 mg (has no  administration in time range)  melatonin tablet 5 mg (has no administration in time range)  lactated ringers 1,000 mL with potassium chloride 40 mEq infusion (has no administration in time range)  insulin aspart (novoLOG) injection 0-9 Units (has no administration in time range)  insulin aspart (novoLOG) injection 0-5 Units (has no administration in time range)  acetaminophen (TYLENOL) tablet 650 mg (has no administration in time range)  potassium chloride SA (KLOR-CON M) CR tablet 40 mEq (40 mEq Oral Given 08/07/22 1859)    ED Course/ Medical Decision Making/ A&P  Medical Decision Making Amount and/or Complexity of Data Reviewed Labs: ordered.  Risk Decision regarding hospitalization.   72 yo M with a cc of pressure and blood per rectum.  He had 2 episodes over the past couple weeks.  Was seen by his PCP today and had hemoglobin of 7.2 which is down from a baseline of about 15.  Patient hemodynamically stable.  Not on blood thinners.  I discussed case with Dr. Bosie Clos, [facility].  Recommended hospitalist admission.  Will hold off on imaging at this time unless he starts having continued bleeding in the hospital.  I-STAT Chem-8 without significant change to his baseline renal function.  He is get mild hypokalemia.  Will discuss with medicine.  The patients results and plan were reviewed and discussed.   Any x-rays performed were independently reviewed by myself.   Differential diagnosis were considered with the presenting HPI.  Medications  pantoprazole (PROTONIX) injection 40 mg (40 mg Intravenous Given 08/07/22 1902)  prochlorperazine (COMPAZINE) injection 5 mg (has no administration in time range)  melatonin tablet 5 mg (has no administration in time range)  lactated ringers 1,000 mL with potassium chloride 40 mEq infusion (has no administration in time range)  insulin aspart (novoLOG) injection 0-9 Units (has no administration in time range)  insulin  aspart (novoLOG) injection 0-5 Units (has no administration in time range)  acetaminophen (TYLENOL) tablet 650 mg (has no administration in time range)  potassium chloride SA (KLOR-CON M) CR tablet 40 mEq (40 mEq Oral Given 08/07/22 1859)    Vitals:   08/07/22 1730 08/07/22 1815 08/07/22 1845 08/07/22 1900  BP: 127/75 132/75 (!) 142/78 137/85  Pulse: 67 62 66 64  Resp: 17 13 15    Temp:      SpO2: 99% 100% 100% 99%    Final diagnoses:  BRBPR (bright red blood per rectum)    Admission/ observation were discussed with the admitting physician, patient and/or family and they are comfortable with the plan.         Final Clinical Impression(s) / ED Diagnoses Final diagnoses:  BRBPR (bright red blood per rectum)    Rx / DC Orders ED Discharge Orders     None         Melene Plan, DO 08/07/22 1919

## 2022-08-07 NOTE — Plan of Care (Signed)
Discussed with patient plan of care for the evening, pain management and admission questions with some teach back displayed. ° °Problem: Education: °Goal: Knowledge of General Education information will improve °Description: Including pain rating scale, medication(s)/side effects and non-pharmacologic comfort measures °Outcome: Progressing °  °Problem: Pain Managment: °Goal: General experience of comfort will improve °Outcome: Progressing °  °

## 2022-08-08 DIAGNOSIS — K922 Gastrointestinal hemorrhage, unspecified: Secondary | ICD-10-CM | POA: Diagnosis not present

## 2022-08-08 LAB — BPAM RBC
Blood Product Expiration Date: 202406192359
ISSUE DATE / TIME: 202405232128

## 2022-08-08 LAB — COMPREHENSIVE METABOLIC PANEL
ALT: 20 U/L (ref 0–44)
AST: 24 U/L (ref 15–41)
Albumin: 3.4 g/dL — ABNORMAL LOW (ref 3.5–5.0)
Alkaline Phosphatase: 35 U/L — ABNORMAL LOW (ref 38–126)
Anion gap: 8 (ref 5–15)
BUN: 14 mg/dL (ref 8–23)
CO2: 27 mmol/L (ref 22–32)
Calcium: 8.7 mg/dL — ABNORMAL LOW (ref 8.9–10.3)
Chloride: 105 mmol/L (ref 98–111)
Creatinine, Ser: 1.13 mg/dL (ref 0.61–1.24)
GFR, Estimated: >60 mL/min (ref >60)
Glucose, Bld: 151 mg/dL — ABNORMAL HIGH (ref 70–99)
Potassium: 2.9 mmol/L — ABNORMAL LOW (ref 3.5–5.1)
Sodium: 140 mmol/L (ref 135–145)
Total Bilirubin: 1.5 mg/dL — ABNORMAL HIGH (ref 0.3–1.2)
Total Protein: 5.9 g/dL — ABNORMAL LOW (ref 6.5–8.1)

## 2022-08-08 LAB — PHOSPHORUS: Phosphorus: 3.4 mg/dL (ref 2.5–4.6)

## 2022-08-08 LAB — RETICULOCYTES
Immature Retic Fract: 30.8 % — ABNORMAL HIGH (ref 2.3–15.9)
RBC.: 3.29 MIL/uL — ABNORMAL LOW (ref 4.22–5.81)
Retic Count, Absolute: 224 10*3/uL — ABNORMAL HIGH (ref 19.0–186.0)
Retic Ct Pct: 6.8 % — ABNORMAL HIGH (ref 0.4–3.1)

## 2022-08-08 LAB — VITAMIN B12: Vitamin B-12: 304 pg/mL (ref 180–914)

## 2022-08-08 LAB — BASIC METABOLIC PANEL
Anion gap: 7 (ref 5–15)
BUN: 7 mg/dL — ABNORMAL LOW (ref 8–23)
CO2: 24 mmol/L (ref 22–32)
Calcium: 8.5 mg/dL — ABNORMAL LOW (ref 8.9–10.3)
Chloride: 107 mmol/L (ref 98–111)
Creatinine, Ser: 1.02 mg/dL (ref 0.61–1.24)
GFR, Estimated: >60 mL/min (ref >60)
Glucose, Bld: 157 mg/dL — ABNORMAL HIGH (ref 70–99)
Potassium: 4.3 mmol/L (ref 3.5–5.1)
Sodium: 138 mmol/L (ref 135–145)

## 2022-08-08 LAB — HEMOGLOBIN AND HEMATOCRIT, BLOOD
HCT: 30.9 % — ABNORMAL LOW (ref 39.0–52.0)
HCT: 27.3 % — ABNORMAL LOW (ref 39.0–52.0)
Hemoglobin: 9.9 g/dL — ABNORMAL LOW (ref 13.0–17.0)
Hemoglobin: 8.8 g/dL — ABNORMAL LOW (ref 13.0–17.0)

## 2022-08-08 LAB — GLUCOSE, CAPILLARY
Glucose-Capillary: 162 mg/dL — ABNORMAL HIGH (ref 70–99)
Glucose-Capillary: 231 mg/dL — ABNORMAL HIGH (ref 70–99)
Glucose-Capillary: 119 mg/dL — ABNORMAL HIGH (ref 70–99)
Glucose-Capillary: 191 mg/dL — ABNORMAL HIGH (ref 70–99)

## 2022-08-08 LAB — IRON AND TIBC
Iron: 27 ug/dL — ABNORMAL LOW (ref 45–182)
Saturation Ratios: 6 % — ABNORMAL LOW (ref 17.9–39.5)
TIBC: 433 ug/dL (ref 250–450)
UIBC: 406 ug/dL

## 2022-08-08 LAB — TYPE AND SCREEN
ABO/RH(D): AB POS
Antibody Screen: NEGATIVE
Unit division: 0

## 2022-08-08 LAB — MAGNESIUM: Magnesium: 2 mg/dL (ref 1.7–2.4)

## 2022-08-08 LAB — CBC
HCT: 26.3 % — ABNORMAL LOW (ref 39.0–52.0)
Hemoglobin: 8.5 g/dL — ABNORMAL LOW (ref 13.0–17.0)
MCH: 29.5 pg (ref 26.0–34.0)
MCHC: 32.3 g/dL (ref 30.0–36.0)
MCV: 91.3 fL (ref 80.0–100.0)
Platelets: 159 10*3/uL (ref 150–400)
RBC: 2.88 MIL/uL — ABNORMAL LOW (ref 4.22–5.81)
RDW: 13.6 % (ref 11.5–15.5)
WBC: 5.2 10*3/uL (ref 4.0–10.5)
nRBC: 0 % (ref 0.0–0.2)

## 2022-08-08 LAB — FERRITIN: Ferritin: 17 ng/mL — ABNORMAL LOW (ref 24–336)

## 2022-08-08 LAB — MRSA NEXT GEN BY PCR, NASAL: MRSA by PCR Next Gen: NOT DETECTED

## 2022-08-08 LAB — FOLATE: Folate: 21.5 ng/mL (ref >5.9)

## 2022-08-08 MED ORDER — POTASSIUM CHLORIDE CRYS ER 20 MEQ PO TBCR
30.0000 meq | EXTENDED_RELEASE_TABLET | ORAL | Status: AC
  Administered 2022-08-08 (×2): 30 meq via ORAL
  Filled 2022-08-08 (×2): qty 1

## 2022-08-08 MED ORDER — POTASSIUM CHLORIDE CRYS ER 20 MEQ PO TBCR
30.0000 meq | EXTENDED_RELEASE_TABLET | Freq: Once | ORAL | Status: AC
  Administered 2022-08-08: 30 meq via ORAL
  Filled 2022-08-08: qty 1

## 2022-08-08 MED ORDER — SODIUM CHLORIDE 0.9 % IV SOLN
250.0000 mg | Freq: Every day | INTRAVENOUS | Status: AC
  Administered 2022-08-08 – 2022-08-09 (×2): 250 mg via INTRAVENOUS
  Filled 2022-08-08 (×2): qty 20

## 2022-08-08 MED ORDER — HYDRALAZINE HCL 10 MG PO TABS: 10.0000 mg | ORAL_TABLET | Freq: Four times a day (QID) | ORAL | Status: DC | PRN

## 2022-08-08 NOTE — Progress Notes (Signed)
  Transition of Care [facility]) Screening Note   Patient Details  Name: BRAX BITTENBENDER Date of Birth: 11-25-1950   Transition of Care [facility]) CM/SW Contact:    Adrian Prows, RN Phone Number: 08/08/2022, 8:57 AM    Transition of Care Department [id number]) has reviewed patient and no TOC needs have been identified at this time. We will continue to monitor patient advancement through interdisciplinary progression rounds. If new patient transition needs arise, please place a TOC consult.

## 2022-08-08 NOTE — Progress Notes (Signed)
   08/08/22 2218  BiPAP/CPAP/SIPAP  $ Non-Invasive Home Ventilator  Initial  BiPAP/CPAP/SIPAP Pt Type Adult  BiPAP/CPAP/SIPAP DREAMSTATIOND  Mask Type Nasal mask (home mask)  Respiratory Rate 16 breaths/min  Pressure Support 0 cmH20  FiO2 (%) 21 %  Patient Home Equipment No  Auto Titrate Yes (5 min 20 max)   PT placed on cpap

## 2022-08-08 NOTE — Progress Notes (Signed)
Spoke with patient regarding cpap use.  Pt stated he wears nasal pillows with his home cpap machine and is unable to tolerate hospital-provided mask / machine.  Pt stated he will have someone bring in his cpap machine from home to wear tonight.  RN aware.

## 2022-08-08 NOTE — Progress Notes (Signed)
PROGRESS NOTE    [name] [name]  AOZ:[id number] DOB: [birth year] DOA: 08/07/2022 PCP: [name], MD    Brief Narrative:   [name] [name] is a 72 y.o. male with past medical history significant for type 2 diabetes mellitus, hyperlipidemia, hypertension, history of diverticulosis with previous diverticular bleeding who is not on antiplatelet/anticoagulants who presented initially to his PCP office due to generalized fatigue.  He reported having bright red blood per rectum 4 days prior to admission initially bleeding significantly but now has eventually stopped.  Patient also endorsed dark tarry stools in addition with reported heavy NSAID use with Aleve roughly 1-2 months ago.  In his PCP office, FOBT was positive with a hemoglobin of 7.2 from his baseline of 15.  Patient denies abdominal pain, no fever/chills, no shortness of breath or chest pain.  Given his symptoms and significant drop in hemoglobin, patient was for to the ED for further evaluation.  In the ED, temperature 97.8 F, HR 74, RR 14, BP 140/70, SpO2 98% on room air.  WBC 8.4, hemoglobin 8.5, platelet count 198.  Sodium 136, potassium 2.7, chloride 99, CO2 27, glucose 277, BUN 18, creatinine 1.34.  Magnesium 1.9.  [facility] gastroenterology was consulted.  Patient was started on IV Protonix, 1 unit PRBC ordered.  TRH consulted for admission for further evaluation and management for concern of GI bleed.  Assessment & Plan:   GI bleed History of diverticulosis with previous diverticular bleeding Patient presenting to ED from his PCP office with fatigue noted hemoglobin of 7.2 with a positive FOBT.  Baseline hemoglobin 15.  Patient reports bright red blood per rectum which is now stopped.  Additionally patient reports heavy NSAID abuse with Aleve approximately 1-2 months ago associated with dark stools.  Not on anticoagulants/antiplatelets.  [facility] was consulted, believes his painless hematochezia likely diverticular in origin which seems  to be resolving.  Transfuseed 1 unit PRBC on 5/23.  Anemia panel with iron 27, TIBC 433, ferritin 17, folate 21.5. -- Hgb 7.2>8.5>8.5>7.8>8.5>9.9 -- Continue monitor H&H Q6h -- IV iron x 2 -- Protonix 40 mg IV every 12 hours -- Continue clear liquid diet, may advance slowly tomorrow if hemoglobin stable  Hypokalemia Potassium 2.9 this morning, will continue repletion. -- Repeat electrolytes in a.m. to include magnesium  Type 2 diabetes mellitus Diet Controlled at baseline.  Hemoglobin A1c 7.5 on 08/07/2022. -- Sensitive SSI for coverage -- CBG before every meal/at bedtime  Essential hypertension Home regimen includes amlodipine 10 mg p.o. daily, valsartan-HCTZ 320-12.5 mg p.o. daily -- BP 99/82 this morning -- Hold home antihypertensives for now -- Hydralazine 10mg  PO q6h PRN SBP >165 -- Continue to monitor BP closely  Hyperlipidemia -- Crestor 40 mg p.o. daily  Obesity BMI 32.03 kg/m. Discussed with patient needs for aggressive lifestyle changes/weight loss as this complicates all facets of care.  Outpatient follow-up with PCP.   OSA -- Continue nocturnal CPAP   DVT prophylaxis: SCDs Start: 08/07/22 1833    Code Status: Full Code Family Communication: No family present at bedside  Disposition Plan:  Level of care: Telemetry Status is: Inpatient Remains inpatient appropriate because: Needs further monitoring of hemoglobin, stable for transfer to telemetry    Consultants:  [facility] gastroenterology: Signed off 5/24  Procedures:  None  Antimicrobials:  None   Subjective: Patient seen examined bedside, resting comfortably.  RN Onalee Hua present at bedside this morning.  Denies any further bowel movements since admission.  Hemoglobin up to 9.9 this morning.  Patient did endorse that he did use significant amount of Aleve roughly 1-2 months ago with associated dark stools.  Initially came in with bright red blood per rectum but none since.  Seen by GI this morning, no  indication for endoscopy given stability of his hemoglobin with rise.  Patient with no other questions or concerns at this time.  Denies headache, no dizziness, no chest pain, no palpitations, no shortness of breath, no fever/chills/night sweats, no nausea/vomiting/diarrhea, no focal weakness, no current fatigue, no cough/congestion, no paresthesias.  No acute events overnight per nursing staff.  Objective: Vitals:   08/08/22 0800 08/08/22 0801 08/08/22 0900 08/08/22 1000  BP: (!) 172/76 (!) 172/76 (!) 160/76 99/82  Pulse: 61 61 67 64  Resp: 11 11 17 20   Temp:  98.2 F (36.8 C)    TempSrc:  Oral    SpO2: 98% 99% 99% 98%    Intake/Output Summary (Last 24 hours) at 08/08/2022 1106 Last data filed at 08/08/2022 1046 Gross per 24 hour  Intake 1403.14 ml  Output 1600 ml  Net -196.86 ml   There were no vitals filed for this visit.  Examination:  Physical Exam: GEN: NAD, alert and oriented x 3, obese HEENT: NCAT, PERRL, EOMI, sclera clear, MMM PULM: CTAB w/o wheezes/crackles, normal respiratory effort, on room air CV: RRR w/o M/G/R GI: abd soft, NTND, NABS, no R/G/M MSK: no peripheral edema, muscle strength globally intact 5/5 bilateral upper/lower extremities NEURO: CN II-XII intact, no focal deficits, sensation to light touch intact PSYCH: normal mood/affect Integumentary: dry/intact, no rashes or wounds    Data Reviewed: I have personally reviewed following labs and imaging studies  CBC: Recent Labs  Lab 08/07/22 1558 08/07/22 1607 08/07/22 1845 08/08/22 0141 08/08/22 0619  WBC 8.4  --   --  5.2  --   NEUTROABS 6.1  --   --   --   --   HGB 8.5* 8.5* 7.8* 8.5* 9.9*  HCT 26.5* 25.0* 24.3* 26.3* 30.9*  MCV 91.7  --   --  91.3  --   PLT 198  --   --  159  --    Basic Metabolic Panel: Recent Labs  Lab 08/07/22 1558 08/07/22 1607 08/08/22 0141  NA 136 137 140  K 2.7* 2.8* 2.9*  CL 99 96* 105  CO2 27  --  27  GLUCOSE 277* 270* 151*  BUN 18 16 14   CREATININE 1.34*  1.40* 1.13  CALCIUM 9.3  --  8.7*  MG 1.9  --  2.0  PHOS  --   --  3.4   GFR: Estimated Creatinine Clearance: 72.6 mL/min (by C-G formula based on SCr of 1.13 mg/dL). Liver Function Tests: Recent Labs  Lab 08/08/22 0141  AST 24  ALT 20  ALKPHOS 35*  BILITOT 1.5*  PROT 5.9*  ALBUMIN 3.4*   No results for input(s): "LIPASE", "AMYLASE" in the last 168 hours. No results for input(s): "AMMONIA" in the last 168 hours. Coagulation Profile: No results for input(s): "INR", "PROTIME" in the last 168 hours. Cardiac Enzymes: No results for input(s): "CKTOTAL", "CKMB", "CKMBINDEX", "TROPONINI" in the last 168 hours. BNP (last 3 results) No results for input(s): "PROBNP" in the last 8760 hours. HbA1C: Recent Labs    08/07/22 1514  HGBA1C 7.5*   CBG: Recent Labs  Lab 08/07/22 2113 08/08/22 0759  GLUCAP 221* 162*   Lipid Profile: No results for input(s): "CHOL", "HDL", "LDLCALC", "TRIG", "CHOLHDL", "LDLDIRECT" in the last 72 hours. Thyroid Function  Tests: No results for input(s): "TSH", "T4TOTAL", "FREET4", "T3FREE", "THYROIDAB" in the last 72 hours. Anemia Panel: Recent Labs    08/08/22 0619  VITAMINB12 304  FOLATE 21.5  FERRITIN 17*  TIBC 433  IRON 27*  RETICCTPCT 6.8*   Sepsis Labs: No results for input(s): "PROCALCITON", "LATICACIDVEN" in the last 168 hours.  Recent Results (from the past 240 hour(s))  MRSA Next Gen by PCR, Nasal     Status: None   Collection Time: 08/07/22 11:24 PM   Specimen: Nasal Mucosa; Nasal Swab  Result Value Ref Range Status   MRSA by PCR Next Gen NOT DETECTED NOT DETECTED Final    Comment: (NOTE) The GeneXpert MRSA Assay (FDA approved for NASAL specimens only), is one component of a comprehensive MRSA colonization surveillance program. It is not intended to diagnose MRSA infection nor to guide or monitor treatment for MRSA infections. Test performance is not FDA approved in patients less than 73 years old. Performed at [facility], [address] [place], [place] [address]          Radiology Studies: No results found.      Scheduled Meds:  Chlorhexidine Gluconate Cloth  6 each Topical Daily   insulin aspart  0-5 Units Subcutaneous QHS   insulin aspart  0-9 Units Subcutaneous TID WC   pantoprazole (PROTONIX) IV  40 mg Intravenous BID   potassium chloride  30 mEq Oral Q3H   rosuvastatin  40 mg Oral Daily   Continuous Infusions:  ferric gluconate (FERRLECIT) IVPB     lactated ringers 1,000 mL with potassium chloride 40 mEq infusion 100 mL/hr at 08/08/22 0800     LOS: 1 day    Time spent: 52 minutes spent on chart review, discussion with nursing staff, consultants, updating family and interview/physical exam; more than 50% of that time was spent in counseling and/or coordination of care.    Alvira Philips [place], DO [facility] Available via Epic secure chat 7am-7pm After these hours, please refer to coverage provider listed on [email or web address] 08/08/2022, 11:06 AM

## 2022-08-08 NOTE — Progress Notes (Addendum)
[facility] Gastroenterology Progress Note  [name] [name] 72 y.o. [birth year]   Subjective: Denies BMs or bleeding overnight. Chart flowsheet reports 2 episodes of red streaks from rectum. Denies abdominal pain.  Objective: Vital signs: Vitals:   08/08/22 0700 08/08/22 0801  BP: (!) 160/71   Pulse: (!) 59   Resp: 14   Temp:  98.2 F (36.8 C)  SpO2: 97%     Physical Exam: Gen: lethargic, no acute distress, well-nourished HEENT: anicteric sclera CV: RRR Chest: CTA B Abd: soft, nontender, nondistended, +BS Ext: no edema  Lab Results: Recent Labs    08/07/22 1558 08/07/22 1607 08/08/22 0141  NA 136 137 140  K 2.7* 2.8* 2.9*  CL 99 96* 105  CO2 27  --  27  GLUCOSE 277* 270* 151*  BUN 18 16 14   CREATININE 1.34* 1.40* 1.13  CALCIUM 9.3  --  8.7*  MG 1.9  --  2.0  PHOS  --   --  3.4   Recent Labs    08/08/22 0141  AST 24  ALT 20  ALKPHOS 35*  BILITOT 1.5*  PROT 5.9*  ALBUMIN 3.4*   Recent Labs    08/07/22 1558 08/07/22 1607 08/08/22 0141 08/08/22 0619  WBC 8.4  --  5.2  --   NEUTROABS 6.1  --   --   --   HGB 8.5*   < > 8.5* 9.9*  HCT 26.5*   < > 26.3* 30.9*  MCV 91.7  --  91.3  --   PLT 198  --  159  --    < > = values in this interval not displayed.      Assessment/Plan: Painless hematochezia - likely diverticular in origin. Seems to be resolving. Clear liquids this morning and and if remains stable then slowly advance tomorrow. [facility] will sign off. Call if questions. Dr. Ewing Schlein on call this weekend if needed.   Shirley Friar 08/08/2022, 8:07 AM  Questions please call [phone] ID: [name] [name], male   DOB: [birth year], 72 y.o.   MRN: [id number]

## 2022-08-08 NOTE — Evaluation (Signed)
Physical Therapy Evaluation Patient Details Name: [name] [name] MRN: [id number] DOB: 1951/02/19 Today's Date: 08/08/2022  History of Present Illness  Pt admitted from home with anemia 2* GIB with Hgb 7.2 and c/o lightheadedness.  Clinical Impression  Pt admitted as above but currently denies any lightheadedness and demonstrates MOD I with basic mobility tasks and IND with ambulation in halls including stepping bkwd, sideways and turning quickly.  Pt reports he is at or near baseline and will be dc from PT service with mobility team referral for duration of stay.  Pt hopeful for dc home tomorrow.     Recommendations for follow up therapy are one component of a multi-disciplinary discharge planning process, led by the attending physician.  Recommendations may be updated based on patient status, additional functional criteria and insurance authorization.  Follow Up Recommendations       Assistance Recommended at Discharge PRN  Patient can return home with the following       Equipment Recommendations None recommended by PT  Recommendations for Other Services       Functional Status Assessment Patient has not had a recent decline in their functional status     Precautions / Restrictions Precautions Precautions: Fall Restrictions Weight Bearing Restrictions: No      Mobility  Bed Mobility               General bed mobility comments: UP in chair and requests back to same    Transfers Overall transfer level: Needs assistance Equipment used: None Transfers: Sit to/from Stand Sit to Stand: Modified independent (Device/Increase time)                Ambulation/Gait Ambulation/Gait assistance: Min guard, Supervision, Independent Gait Distance (Feet): 1000 Feet Assistive device: None Gait Pattern/deviations: Step-through pattern, Wide base of support Gait velocity: mod pace     General Gait Details: Mild general instability with slightly widened BOS but no overt  LOB.  Pt managing IV pole for last 150' with no difficulty  Stairs            Wheelchair Mobility    Modified Rankin (Stroke Patients Only)       Balance Overall balance assessment: Mild deficits observed, not formally tested                                           Pertinent Vitals/Pain Pain Assessment Pain Assessment: No/denies pain    Home Living Family/patient expects to be discharged to:: Private residence Living Arrangements: Spouse/significant other Available Help at Discharge: Family;Available 24 hours/day Type of Home: House Home Access: Stairs to enter Entrance Stairs-Rails: Right Entrance Stairs-Number of Steps: 4   Home Layout: One level Home Equipment: Cane - single point      Prior Function Prior Level of Function : Independent/Modified Independent             Mobility Comments: Was power washing the deck day before admit       Hand Dominance        Extremity/Trunk Assessment   Upper Extremity Assessment Upper Extremity Assessment: Overall WFL for tasks assessed    Lower Extremity Assessment Lower Extremity Assessment: Overall WFL for tasks assessed    Cervical / Trunk Assessment Cervical / Trunk Assessment: Normal  Communication   Communication: No difficulties  Cognition Arousal/Alertness: Awake/alert Behavior During Therapy: WFL for tasks assessed/performed Overall Cognitive Status: Within  Functional Limits for tasks assessed                                          General Comments      Exercises     Assessment/Plan    PT Assessment Patient does not need any further PT services  PT Problem List         PT Treatment Interventions      PT Goals (Current goals can be found in the Care Plan section)  Acute Rehab PT Goals PT Goal Formulation: All assessment and education complete, DC therapy    Frequency       Co-evaluation               AM-PAC PT "6 Clicks"  Mobility  Outcome Measure Help needed turning from your back to your side while in a flat bed without using bedrails?: None Help needed moving from lying on your back to sitting on the side of a flat bed without using bedrails?: None Help needed moving to and from a bed to a chair (including a wheelchair)?: None Help needed standing up from a chair using your arms (e.g., wheelchair or bedside chair)?: None Help needed to walk in hospital room?: None Help needed climbing 3-5 steps with a railing? : None 6 Click Score: 24    End of Session Equipment Utilized During Treatment: Gait belt Activity Tolerance: Patient tolerated treatment well Patient left: in chair;with call bell/phone within reach;with family/visitor present Nurse Communication: Mobility status PT Visit Diagnosis: Unsteadiness on feet (R26.81)    Time: 4782-9562 PT Time Calculation (min) (ACUTE ONLY): 16 min   Charges:   PT Evaluation $PT Eval Low Complexity: 1 Low          Mauro Kaufmann PT Acute Rehabilitation Services Pager [phone] Office [phone]   Huda Petrey 08/08/2022, 3:30 PM

## 2022-08-09 DIAGNOSIS — K922 Gastrointestinal hemorrhage, unspecified: Secondary | ICD-10-CM | POA: Diagnosis not present

## 2022-08-09 LAB — CBC
HCT: 26.8 % — ABNORMAL LOW (ref 39.0–52.0)
Hemoglobin: 8.5 g/dL — ABNORMAL LOW (ref 13.0–17.0)
MCH: 30.1 pg (ref 26.0–34.0)
MCHC: 31.7 g/dL (ref 30.0–36.0)
MCV: 95 fL (ref 80.0–100.0)
Platelets: 167 10*3/uL (ref 150–400)
RBC: 2.82 MIL/uL — ABNORMAL LOW (ref 4.22–5.81)
RDW: 13.7 % (ref 11.5–15.5)
WBC: 4.9 10*3/uL (ref 4.0–10.5)
nRBC: 0 % (ref 0.0–0.2)

## 2022-08-09 LAB — BASIC METABOLIC PANEL
Anion gap: 6 (ref 5–15)
BUN: 10 mg/dL (ref 8–23)
CO2: 25 mmol/L (ref 22–32)
Calcium: 8.2 mg/dL — ABNORMAL LOW (ref 8.9–10.3)
Chloride: 108 mmol/L (ref 98–111)
Creatinine, Ser: 0.96 mg/dL (ref 0.61–1.24)
GFR, Estimated: >60 mL/min (ref >60)
Glucose, Bld: 153 mg/dL — ABNORMAL HIGH (ref 70–99)
Potassium: 3.4 mmol/L — ABNORMAL LOW (ref 3.5–5.1)
Sodium: 139 mmol/L (ref 135–145)

## 2022-08-09 LAB — GLUCOSE, CAPILLARY: Glucose-Capillary: 140 mg/dL — ABNORMAL HIGH (ref 70–99)

## 2022-08-09 LAB — MAGNESIUM: Magnesium: 1.8 mg/dL (ref 1.7–2.4)

## 2022-08-09 MED ORDER — PANTOPRAZOLE SODIUM 40 MG PO TBEC: 40.0000 mg | DELAYED_RELEASE_TABLET | Freq: Two times a day (BID) | ORAL | 0 refills | Status: DC

## 2022-08-09 MED ORDER — IRON (FERROUS SULFATE) 325 (65 FE) MG PO TABS: 65.0000 mg | ORAL_TABLET | Freq: Every day | ORAL | 2 refills | Status: DC

## 2022-08-09 MED ORDER — POTASSIUM CHLORIDE CRYS ER 20 MEQ PO TBCR
30.0000 meq | EXTENDED_RELEASE_TABLET | ORAL | Status: AC
  Administered 2022-08-09 (×2): 30 meq via ORAL
  Filled 2022-08-09 (×2): qty 1

## 2022-08-09 NOTE — Discharge Summary (Signed)
Physician Discharge Summary  [name] [name]:[id number] DOB: 1951-03-14 DOA: 08/07/2022  PCP: [name], MD  Admit date: 08/07/2022 Discharge date: 08/09/2022  Admitted From: Home Disposition: Home  Recommendations for Outpatient Follow-up:  Follow up with PCP in 1-2 weeks Follow-up with gastroenterology, Dr. Ewing Schlein for planned EGD on 5/29 Please obtain CBC in one week to reassess hemoglobin level Started on Protonix 40 mg p.o. twice daily Continue to encourage avoidance of NSAIDs  Home Health: No Equipment/Devices: None  Discharge Condition: Stable CODE STATUS: Full code Diet recommendation: Heart healthy/consistent carbohydrate diet  History of present illness:  [name] [name] is a 72 y.o. male with past medical history significant for type 2 diabetes mellitus, hyperlipidemia, hypertension, history of diverticulosis with previous diverticular bleeding who is not on antiplatelet/anticoagulants who presented initially to his PCP office due to generalized fatigue.  He reported having bright red blood per rectum 4 days prior to admission initially bleeding significantly but now has eventually stopped.  Patient also endorsed dark tarry stools in addition with reported heavy NSAID use with Aleve roughly 1-2 months ago.  In his PCP office, FOBT was positive with a hemoglobin of 7.2 from his baseline of 15.  Patient denies abdominal pain, no fever/chills, no shortness of breath or chest pain.  Given his symptoms and significant drop in hemoglobin, patient was for to the ED for further evaluation.   In the ED, temperature 97.8 F, HR 74, RR 14, BP 140/70, SpO2 98% on room air.  WBC 8.4, hemoglobin 8.5, platelet count 198.  Sodium 136, potassium 2.7, chloride 99, CO2 27, glucose 277, BUN 18, creatinine 1.34.  Magnesium 1.9.  [facility] gastroenterology was consulted.  Patient was started on IV Protonix, 1 unit PRBC ordered.  TRH consulted for admission for further evaluation and management for  concern of GI bleed.  Hospital course:  GI bleed History of diverticulosis with previous diverticular bleeding Patient presenting to ED from his PCP office with fatigue noted hemoglobin of 7.2 with a positive FOBT.  Baseline hemoglobin 15.  Patient reports bright red blood per rectum which is now stopped.  Additionally patient reports heavy NSAID abuse with Aleve approximately 1-2 months ago associated with dark stools.  Not on anticoagulants/antiplatelets.  [facility] was consulted, believes his painless hematochezia likely diverticular in origin.  Transfused 1 unit PRBC on 5/23.  Anemia panel with iron 27, TIBC 433, ferritin 17, folate 21.5 and patient was administered IV iron x 2.  Seen by GI and plan outpatient EGD next week to rule out upper source pathology.  Also recommended repeat colonoscopy in the near future given his last exam was 8 years prior.  Continue iron supplementation, Protonix 40 mg p.o. twice daily.  Recommend repeat CBC 1 week to reassess hemoglobin level.  Hemoglobin stable, 8.5 at time of discharge.   Hypokalemia Repleted during hospitalization.   Type 2 diabetes mellitus Diet Controlled at baseline.  Hemoglobin A1c 7.5 on 08/07/2022.   Essential hypertension Home regimen includes amlodipine 10 mg p.o. daily, valsartan-HCTZ 320-12.5 mg p.o. daily  Hyperlipidemia Crestor 40 mg p.o. daily   Obesity BMI 32.03 kg/m. Discussed with patient needs for aggressive lifestyle changes/weight loss as this complicates all facets of care.  Outpatient follow-up with PCP.    OSA Continue nocturnal CPAP  Discharge Diagnoses:  Principal Problem:   GI bleed    Discharge Instructions  Discharge Instructions     Call MD for:  difficulty breathing, headache or visual disturbances   Complete  by: As directed    Call MD for:  extreme fatigue   Complete by: As directed    Call MD for:  persistant dizziness or light-headedness   Complete by: As directed    Call MD for:   persistant nausea and vomiting   Complete by: As directed    Call MD for:  severe uncontrolled pain   Complete by: As directed    Call MD for:  temperature >100.4   Complete by: As directed    Diet - low sodium heart healthy   Complete by: As directed    Increase activity slowly   Complete by: As directed       Allergies as of 08/09/2022       Reactions   Yellow Jacket Venom Anaphylaxis        Medication List     STOP taking these medications    sildenafil 20 MG tablet Commonly known as: REVATIO       TAKE these medications    Accu-Chek FastClix Lancets Misc 1 each by Does not apply route daily.   Accu-Chek Guide test strip Generic drug: glucose blood Use one daily to check blood sugar   amLODipine 10 MG tablet Commonly known as: NORVASC Take 1 tablet (10 mg total) by mouth daily.   EPINEPHrine 0.3 mg/0.3 mL Devi Commonly known as: EPI-PEN Inject 0.3 mLs (0.3 mg total) into the muscle once. What changed:  when to take this reasons to take this   fish oil-omega-3 fatty acids 1000 MG capsule Take 2 g by mouth daily.   Glucosamine 1500 Complex Caps Take 1 capsule by mouth daily.   Iron (Ferrous Sulfate) 325 (65 Fe) MG Tabs Take 65 mg by mouth daily with breakfast.   multivitamin with minerals tablet Take 1 tablet by mouth daily.   pantoprazole 40 MG tablet Commonly known as: Protonix Take 1 tablet (40 mg total) by mouth 2 (two) times daily.   PRESCRIPTION MEDICATION CPAP- At bedtime   rosuvastatin 40 MG tablet Commonly known as: CRESTOR Take 1 tablet (40 mg total) by mouth daily. What changed: when to take this   Testosterone 20.25 MG/ACT (1.62%) Gel APPLY 3 PUMPS TO THE SHOULDER AND ARM DAILY What changed:  how much to take how to take this when to take this   valsartan-hydrochlorothiazide 320-12.5 MG tablet Commonly known as: DIOVAN-HCT Take 1 tablet by mouth daily.        Follow-up Information     [name], MD. Schedule  an appointment as soon as possible for a visit in 1 week(s).   Specialty: Family Medicine Contact information: [address] [place] [address] [phone]         Vida Rigger, MD. Go on 08/13/2022.   Specialty: Gastroenterology Contact information: [address] [place] [address] [phone]                Allergies  Allergen Reactions   Yellow Jacket Venom Anaphylaxis    Consultations: [facility] gastroenterology   Procedures/Studies: No results found.   Subjective: Patient seen examined bedside, resting comfortably.  Sitting in bedside chair.  RN present.  Seen by GI this morning, per Dr. Ewing Schlein may discharge home with planned EGD outpatient next week.  No other specific complaints or concerns at this time.  Denies headache, no dizziness, no chest pain, palpitations, no shortness of breath, no abdominal pain, no fever/chills/night sweats, no nausea/vomiting/diarrhea, no focal weakness, no fatigue, no paresthesias.  No acute  events overnight per nursing staff.  Discharge Exam: Vitals:   08/08/22 2347 08/09/22 0534  BP: 131/63 131/77  Pulse: 60 60  Resp: 19 16  Temp: 97.8 F (36.6 C) 98.2 F (36.8 C)  SpO2: 99% 98%   Vitals:   08/08/22 1620 08/08/22 1959 08/08/22 2347 08/09/22 0534  BP: (!) 143/67 (!) 149/75 131/63 131/77  Pulse: (!) 55 66 60 60  Resp: 20  19 16   Temp: 97.8 F (36.6 C) 98 F (36.7 C) 97.8 F (36.6 C) 98.2 F (36.8 C)  TempSrc: Oral Oral Oral Oral  SpO2: 99% 98% 99% 98%  Weight:      Height:        Physical Exam: GEN: NAD, alert and oriented x 3, obese HEENT: NCAT, PERRL, EOMI, sclera clear, MMM PULM: CTAB w/o wheezes/crackles, normal respiratory effort, on room air CV: RRR w/o M/G/R GI: abd soft, NTND, NABS, no R/G/M MSK: no peripheral edema, muscle strength globally intact 5/5 bilateral upper/lower extremities NEURO: CN II-XII intact, no focal deficits, sensation to light touch intact PSYCH:  normal mood/affect Integumentary: dry/intact, no rashes or wounds    The results of significant diagnostics from this hospitalization (including imaging, microbiology, ancillary and laboratory) are listed below for reference.     Microbiology: Recent Results (from the past 240 hour(s))  MRSA Next Gen by PCR, Nasal     Status: None   Collection Time: 08/07/22 11:24 PM   Specimen: Nasal Mucosa; Nasal Swab  Result Value Ref Range Status   MRSA by PCR Next Gen NOT DETECTED NOT DETECTED Final    Comment: (NOTE) The GeneXpert MRSA Assay (FDA approved for NASAL specimens only), is one component of a comprehensive MRSA colonization surveillance program. It is not intended to diagnose MRSA infection nor to guide or monitor treatment for MRSA infections. Test performance is not FDA approved in patients less than 68 years old. Performed at [facility], [address] [place], [place] [address]      Labs: BNP (last 3 results) No results for input(s): "BNP" in the last 8760 hours. Basic Metabolic Panel: Recent Labs  Lab 08/07/22 1558 08/07/22 1607 08/08/22 0141 08/08/22 1525 08/09/22 0543  NA 136 137 140 138 139  K 2.7* 2.8* 2.9* 4.3 3.4*  CL 99 96* 105 107 108  CO2 27  --  27 24 25   GLUCOSE 277* 270* 151* 157* 153*  BUN 18 16 14  7* 10  CREATININE 1.34* 1.40* 1.13 1.02 0.96  CALCIUM 9.3  --  8.7* 8.5* 8.2*  MG 1.9  --  2.0  --  1.8  PHOS  --   --  3.4  --   --    Liver Function Tests: Recent Labs  Lab 08/08/22 0141  AST 24  ALT 20  ALKPHOS 35*  BILITOT 1.5*  PROT 5.9*  ALBUMIN 3.4*   No results for input(s): "LIPASE", "AMYLASE" in the last 168 hours. No results for input(s): "AMMONIA" in the last 168 hours. CBC: Recent Labs  Lab 08/07/22 1558 08/07/22 1607 08/07/22 1845 08/08/22 0141 08/08/22 0619 08/08/22 1219 08/09/22 0543  WBC 8.4  --   --  5.2  --   --  4.9  NEUTROABS 6.1  --   --   --   --   --   --   HGB 8.5*   < > 7.8* 8.5* 9.9*  8.8* 8.5*  HCT 26.5*   < > 24.3* 26.3* 30.9* 27.3* 26.8*  MCV 91.7  --   --  91.3  --   --  95.0  PLT 198  --   --  159  --   --  167   < > = values in this interval not displayed.   Cardiac Enzymes: No results for input(s): "CKTOTAL", "CKMB", "CKMBINDEX", "TROPONINI" in the last 168 hours. BNP: Invalid input(s): "POCBNP" CBG: Recent Labs  Lab 08/08/22 0759 08/08/22 1147 08/08/22 1620 08/08/22 2109 08/09/22 0729  GLUCAP 162* 231* 119* 191* 140*   D-Dimer No results for input(s): "DDIMER" in the last 72 hours. Hgb A1c Recent Labs    08/07/22 1514  HGBA1C 7.5*   Lipid Profile No results for input(s): "CHOL", "HDL", "LDLCALC", "TRIG", "CHOLHDL", "LDLDIRECT" in the last 72 hours. Thyroid function studies No results for input(s): "TSH", "T4TOTAL", "T3FREE", "THYROIDAB" in the last 72 hours.  Invalid input(s): "FREET3" Anemia work up Recent Labs    08/08/22 0619  VITAMINB12 304  FOLATE 21.5  FERRITIN 17*  TIBC 433  IRON 27*  RETICCTPCT 6.8*   Urinalysis    Component Value Date/Time   BILIRUBINUR neg 03/08/2014 0934   PROTEINUR neg 03/08/2014 0934   UROBILINOGEN negative 03/08/2014 0934   NITRITE neg 03/08/2014 0934   LEUKOCYTESUR Negative 03/08/2014 0934   Sepsis Labs Recent Labs  Lab 08/07/22 1558 08/08/22 0141 08/09/22 0543  WBC 8.4 5.2 4.9   Microbiology Recent Results (from the past 240 hour(s))  MRSA Next Gen by PCR, Nasal     Status: None   Collection Time: 08/07/22 11:24 PM   Specimen: Nasal Mucosa; Nasal Swab  Result Value Ref Range Status   MRSA by PCR Next Gen NOT DETECTED NOT DETECTED Final    Comment: (NOTE) The GeneXpert MRSA Assay (FDA approved for NASAL specimens only), is one component of a comprehensive MRSA colonization surveillance program. It is not intended to diagnose MRSA infection nor to guide or monitor treatment for MRSA infections. Test performance is not FDA approved in patients less than 62 years old. Performed at  [facility], [address], [place], [place] [address]      Time coordinating discharge: Over 30 minutes  SIGNED:   Alvira Philips [place], DO  [facility] 08/09/2022, 10:52 AM

## 2022-08-09 NOTE — Progress Notes (Signed)
[name] [name] 10:03 AM  Subjective: Patient seen and examined and case discussed with his nurse as well and his case discussed with my partner Dr. Bosie Clos and his hospital computer chart reviewed and he has some normal stools with a little black and he has significant concerns about upper GI bleeding although he has no upper tract symptoms and is not on any aspirin or nonsteroidals and his BUN and creatinine have not been elevated and he has no other complaints but he does have concerns about going on a [place] cruise in 10 days and we answered all of his questions  Objective: Vital signs stable afebrile no acute distress abdomen is soft nontender chemistry is okay he was iron deficient hemoglobin essentially unchanged  Assessment: History of diverticular bleeding but also iron deficient  Plan: Okay with me to go home and I have left a message for Dr. Marge Duncans nurse to set him up for an endoscopy Wednesday at 1015 in our office and he can take Prilosec until then and if that is not revealing I have recommended an outpatient colonoscopy as well just to be sure since his last 1 was 8 years ago and hold off on aspirin and nonsteroidals at home as well and please call me if I could be of any further assistance with this hospital stay patient agrees to the plan  [facility]  office [phone] After 5PM or if no answer call [phone]

## 2022-08-13 ENCOUNTER — Telehealth: Payer: Self-pay

## 2022-08-13 DIAGNOSIS — K219 Gastro-esophageal reflux disease without esophagitis: Secondary | ICD-10-CM | POA: Diagnosis not present

## 2022-08-13 DIAGNOSIS — K2930 Chronic superficial gastritis without bleeding: Secondary | ICD-10-CM | POA: Diagnosis not present

## 2022-08-13 DIAGNOSIS — K922 Gastrointestinal hemorrhage, unspecified: Secondary | ICD-10-CM | POA: Diagnosis not present

## 2022-08-13 DIAGNOSIS — K921 Melena: Secondary | ICD-10-CM | POA: Diagnosis not present

## 2022-08-13 NOTE — Transitions of Care (Post Inpatient/ED Visit) (Signed)
08/13/2022  Name: [name] [name] MRN: [id number] DOB: 15-Feb-1951  Today's TOC FU Call Status: Today's TOC FU Call Status:: Successful TOC FU Call Competed TOC FU Call Complete Date: 08/13/22  Transition Care Management Follow-up Telephone Call Date of Discharge: 08/09/22 Discharge Facility: [facility]) Type of Discharge: Inpatient Admission Primary Inpatient Discharge Diagnosis:: "bright red blood per rectum" How have you been since you were released from the hospital?: Better (Pt states he is doing good-no bleeding since returning home-having normal BMs. Appetite is good. He went this morning and had endoscopy done-was told "no ulcers"-will get final report in 10days.) Any questions or concerns?: No  Items Reviewed: Did you receive and understand the discharge instructions provided?: Yes Medications obtained,verified, and reconciled?: Yes (Medications Reviewed) Any new allergies since your discharge?: No Dietary orders reviewed?: Yes Type of Diet Ordered:: low salt/heart healthy/carb modified Do you have support at home?: Yes People in Home: spouse Name of Support/Comfort Primary Source: Misty Stanley  Medications Reviewed Today: Medications Reviewed Today     Reviewed by Charlyn Minerva, RN (Registered Nurse) on 08/13/22 at 1008  Med List Status: <None>   Medication Order Taking? Sig Documenting Provider Last Dose Status Informant  Accu-Chek FastClix Lancets MISC [id number] Yes 1 each by Does not apply route daily. [name], MD Taking Active Self  amLODipine (NORVASC) 10 MG tablet [id number] Yes Take 1 tablet (10 mg total) by mouth daily. [name], MD Taking Active Self  EPINEPHrine (EPI-PEN) 0.3 mg/0.3 mL DEVI [id number] Yes Inject 0.3 mLs (0.3 mg total) into the muscle once.  Patient taking differently: Inject 0.3 mg into the muscle once as needed (for anaphylaxis).   [name], MD Taking Active Self           Med Note Antony Madura, Harlin Rain Aug 07, 2022  6:43 PM) PROVIDER: The patient's medication has EXPIRED. He needs a NEW Rx, please.  fish oil-omega-3 fatty acids 1000 MG capsule [id number] Yes Take 2 g by mouth daily. [provider] Taking Active Self  Glucosamine-Chondroit-Vit C-Mn (GLUCOSAMINE 1500 COMPLEX) CAPS [id number] Yes Take 1 capsule by mouth daily.  [provider] Taking Active Self  glucose blood (ACCU-CHEK GUIDE) test strip [id number] Yes Use one daily to check blood sugar [name], MD Taking Active Self  Iron, Ferrous Sulfate, 325 (65 Fe) MG TABS [id number] Yes Take 65 mg by mouth daily with breakfast. [place], Alvira Philips, DO Taking Active   Multiple Vitamins-Minerals (MULTIVITAMIN WITH MINERALS) tablet [id number] Yes Take 1 tablet by mouth daily. [provider] Taking Active Self  pantoprazole (PROTONIX) 40 MG tablet [id number] Yes Take 1 tablet (40 mg total) by mouth 2 (two) times daily. [place], Eric J, DO Taking Active   PRESCRIPTION MEDICATION [id number] Yes CPAP- At bedtime [provider] Taking Active Self  rosuvastatin (CRESTOR) 40 MG tablet [id number] Yes Take 1 tablet (40 mg total) by mouth daily.  Patient taking differently: Take 40 mg by mouth at bedtime.   [name], MD Taking Active Self  Testosterone 20.25 MG/ACT (1.62%) GEL [id number] Yes APPLY 3 PUMPS TO THE SHOULDER AND ARM DAILY  Patient taking differently: Place 3 Pump onto the skin See admin instructions. APPLY 3 PUMPS TO THE SHOULDER AND ARM DAILY   [name], MD Taking Active Self  valsartan-hydrochlorothiazide (DIOVAN-HCT) 320-12.5 MG tablet [id number] Yes Take 1 tablet by mouth daily. [name], MD Taking Active Self  Home Care and Equipment/Supplies: Were Home Health Services Ordered?: NA Any new equipment or medical supplies ordered?: NA  Functional Questionnaire: Do you need assistance with bathing/showering or dressing?: No Do you need assistance with meal preparation?: No Do  you need assistance with eating?: No Do you have difficulty maintaining continence: No Do you need assistance with getting out of bed/getting out of a chair/moving?: No Do you have difficulty managing or taking your medications?: No  Follow up appointments reviewed: PCP Follow-up appointment confirmed?: Yes Date of PCP follow-up appointment?: 08/14/22 Follow-up Provider: Dr. Lynelle Doctor Specialist [facility] Follow-up appointment confirmed?: No Reason Specialist Follow-Up Not Confirmed: Patient has Specialist Provider Number and will Call for Appointment (pt will follow up with GI MD regarding next appt) Do you need transportation to your follow-up appointment?: No (pt confirms he is able to drive himself to appts) Do you understand care options if your condition(s) worsen?: Yes-patient verbalized understanding  SDOH Interventions Today    Flowsheet Row Most Recent Value  SDOH Interventions   Food Insecurity Interventions Intervention Not Indicated  Transportation Interventions Intervention Not Indicated       TOC Interventions Today    Flowsheet Row Most Recent Value  TOC Interventions   TOC Interventions Discussed/Reviewed TOC Interventions Discussed      Interventions Today    Flowsheet Row Most Recent Value  General Interventions   General Interventions Discussed/Reviewed General Interventions Discussed, Doctor Visits  Doctor Visits Discussed/Reviewed Doctor Visits Discussed, Specialist, PCP  PCP/Specialist Visits Compliance with follow-up visit  Education Interventions   Education Provided Provided Education  Provided Verbal Education On Nutrition, When to see the doctor, Medication  Nutrition Interventions   Nutrition Discussed/Reviewed Nutrition Discussed, Decreasing salt, Decreasing sugar intake  Pharmacy Interventions   Pharmacy Dicussed/Reviewed Pharmacy Topics Discussed, Medications and their functions  Safety Interventions   Safety Discussed/Reviewed Safety  Discussed        Alessandra Grout [facility] Care Management Care Management Community Coordinator Direct Phone: [phone] Toll Free: [phone] Fax: [phone]

## 2022-08-13 NOTE — Progress Notes (Unsigned)
No chief complaint on file.  Patient presents for hospital follow-up.  He was seen by me 5/23 with rectal bleeding, POC Hgb was 7.2 with heme + stool, so he was sent to ER for admission. He was hospitalized 5/23-5/25/24.  Hgb was 8.5 per CBC done in ER, had dropped to 7.8.  He was treated with 1 unit of pRBCs and an IV iron infusions x 2. Hgb had increased to 9.9, but was back to 8.5 on day of discharge.   He denies any further bleeding. Due for recheck. He has been taking iron daily since discharge. He was discharged on Protonix 40mg  BID, advised to avoid NSAIDs. He had EGD 5/29 with Dr. Ewing Schlein. GI also recommended repeat colonoscopy in the near future given his last exam was 8 years prior.  Component Ref Range & Units 4 d ago (08/09/22) 5 d ago (08/08/22) 5 d ago (08/08/22) 5 d ago (08/08/22) 6 d ago (08/07/22) 6 d ago (08/07/22) 6 d ago (08/07/22)  WBC 4.0 - 10.5 K/uL 4.9   5.2   8.4  RBC 4.22 - 5.81 MIL/uL 2.82 Low    2.88 Low    2.89 Low   Hemoglobin 13.0 - 17.0 g/dL 8.5 Low  8.8 Low  9.9 Low  8.5 Low  7.8 Low  8.5 Low  8.5 Low   HCT 39.0 - 52.0 % 26.8 Low  27.3 Low  CM 30.9 Low  CM 26.3 Low  24.3 Low  CM 25.0 Low  26.5 Low   MCV 80.0 - 100.0 fL 95.0   91.3   91.7  MCH 26.0 - 34.0 pg 30.1   29.5   29.4  MCHC 30.0 - 36.0 g/dL 40.9   81.1   91.4  RDW 11.5 - 15.5 % 13.7   13.6   13.6  Platelets 150 - 400 K/uL 167   159   198    He was noted to have hypokalemia, which was repleted in hospital. K+ was 2.8 on admission, went up to 4.3, but was 3.4 at discharge.   Component Ref Range & Units 4 d ago (08/09/22) 5 d ago (08/08/22) 5 d ago (08/08/22) 6 d ago (08/07/22) 6 d ago (08/07/22) 10 mo ago (10/09/21) 2 yr ago (11/03/19)  Sodium 135 - 145 mmol/L 139 138 140 137 136 141 R 141  Potassium 3.5 - 5.1 mmol/L 3.4 Low  4.3 2.9 Low  2.8 Low  2.7 Low Panic  CM 3.6 R 2.8 Low   Chloride 98 - 111 mmol/L 108 107 105 96 Low  99 99 R 108  CO2 22 - 32 mmol/L 25 24 27  27 25  R 26   Glucose, Bld 70 - 99 mg/dL 782 High  956 High  CM 151 High  CM 270 High  CM 277 High  CM 178 High  117 High  CM  Comment: Glucose reference range applies only to samples taken after fasting for at least 8 hours.  BUN 8 - 23 mg/dL 10 7 Low  14 16 18 20  R 9  Creatinine, Ser 2.13 - 1.24 mg/dL 0.86 5.78 4.69 6.29 High  1.34 High  1.26 R 0.76     PMH, PSH, SH reviewed   ROS: No fever, chills, URI symptoms, headaches, dizziness,chest pain, palpitations, shortness of breath. No nausea, vomiting, melena, hematochezia.  No abdominal pain. No rashes.  No urinary complaints. Muscles aches/spasms? ***   PHYSICAL EXAM:  There were no vitals taken for this visit.  Wt  Readings from Last 3 Encounters:  08/08/22 225 lb 1.4 oz (102.1 kg)  08/07/22 226 lb 6.4 oz (102.7 kg)  05/13/22 236 lb 6.4 oz (107.2 kg)       ASSESSMENT/PLAN:   Had EGD with Dr. Ewing Schlein 5/29. Results?? (Did they get faxed, in JCL's folder???)  CBC, b-met   Transition of care visit  F/u as scheduled with Dr. Susann Givens in July.

## 2022-08-14 ENCOUNTER — Ambulatory Visit (INDEPENDENT_AMBULATORY_CARE_PROVIDER_SITE_OTHER): Payer: Medicare Other | Admitting: Family Medicine

## 2022-08-14 ENCOUNTER — Encounter: Payer: Self-pay | Admitting: Family Medicine

## 2022-08-14 VITALS — BP 128/80 | HR 64 | Ht 71.0 in | Wt 227.0 lb

## 2022-08-14 DIAGNOSIS — I10 Essential (primary) hypertension: Secondary | ICD-10-CM | POA: Diagnosis not present

## 2022-08-14 DIAGNOSIS — K922 Gastrointestinal hemorrhage, unspecified: Secondary | ICD-10-CM | POA: Diagnosis not present

## 2022-08-14 DIAGNOSIS — E876 Hypokalemia: Secondary | ICD-10-CM

## 2022-08-14 LAB — BASIC METABOLIC PANEL
BUN/Creatinine Ratio: 15 (ref 10–24)
BUN: 16 mg/dL (ref 8–27)
CO2: 31 mmol/L — ABNORMAL HIGH (ref 20–29)
Calcium: 9.5 mg/dL (ref 8.6–10.2)
Chloride: 100 mmol/L (ref 96–106)
Creatinine, Ser: 1.08 mg/dL (ref 0.76–1.27)
Glucose: 219 mg/dL — ABNORMAL HIGH (ref 70–99)
Potassium: 3.6 mmol/L (ref 3.5–5.2)
Sodium: 137 mmol/L (ref 134–144)
eGFR: 73 mL/min/{1.73_m2} (ref >59)

## 2022-08-14 LAB — CBC WITH DIFFERENTIAL/PLATELET
Basophils Absolute: 0 10*3/uL (ref 0.0–0.2)
Basos: 0 %
EOS (ABSOLUTE): 0.2 10*3/uL (ref 0.0–0.4)
Eos: 4 %
Hematocrit: 29.3 % — ABNORMAL LOW (ref 37.5–51.0)
Hemoglobin: 9.6 g/dL — ABNORMAL LOW (ref 13.0–17.7)
Lymphocytes Absolute: 0.9 10*3/uL (ref 0.7–3.1)
Lymphs: 15 %
MCH: 29.2 pg (ref 26.6–33.0)
MCHC: 32.8 g/dL (ref 31.5–35.7)
MCV: 89 fL (ref 79–97)
Monocytes Absolute: 0.5 10*3/uL (ref 0.1–0.9)
Monocytes: 9 %
Neutrophils Absolute: 4.2 10*3/uL (ref 1.4–7.0)
Neutrophils: 72 %
Platelets: 182 10*3/uL (ref 150–450)
RBC: 3.29 x10E6/uL — ABNORMAL LOW (ref 4.14–5.80)
RDW: 13.1 % (ref 11.6–15.4)
WBC: 5.8 10*3/uL (ref 3.4–10.8)

## 2022-08-15 DIAGNOSIS — K219 Gastro-esophageal reflux disease without esophagitis: Secondary | ICD-10-CM | POA: Diagnosis not present

## 2022-08-15 DIAGNOSIS — K2930 Chronic superficial gastritis without bleeding: Secondary | ICD-10-CM | POA: Diagnosis not present

## 2022-08-18 ENCOUNTER — Encounter: Payer: Self-pay | Admitting: Medical

## 2022-08-18 ENCOUNTER — Ambulatory Visit (INDEPENDENT_AMBULATORY_CARE_PROVIDER_SITE_OTHER): Payer: Medicare Other | Admitting: Medical

## 2022-08-18 VITALS — BP 124/90 | HR 77 | Wt 223.0 lb

## 2022-08-18 DIAGNOSIS — K921 Melena: Secondary | ICD-10-CM | POA: Diagnosis not present

## 2022-08-18 DIAGNOSIS — D649 Anemia, unspecified: Secondary | ICD-10-CM | POA: Diagnosis not present

## 2022-08-18 LAB — CBC
Hematocrit: 27.3 % — ABNORMAL LOW (ref 37.5–51.0)
Hemoglobin: 8.7 g/dL — ABNORMAL LOW (ref 13.0–17.7)
MCH: 28.5 pg (ref 26.6–33.0)
MCHC: 31.9 g/dL (ref 31.5–35.7)
MCV: 90 fL (ref 79–97)
Platelets: 178 10*3/uL (ref 150–450)
RBC: 3.05 x10E6/uL — ABNORMAL LOW (ref 4.14–5.80)
RDW: 12.6 % (ref 11.6–15.4)
WBC: 6.9 10*3/uL (ref 3.4–10.8)

## 2022-08-18 LAB — POCT HEMOGLOBIN: Hemoglobin: 9.2 g/dL — AB (ref 11–14.6)

## 2022-08-18 NOTE — Progress Notes (Signed)
Subjective:  [name] [name] is a 72 y.o. male who presents for Chief Complaint  Patient presents with   other    Blood in stool, third time in four weeks, saw Dr. Lynelle Doctor last week hemoglobin was so low, started bleeding again last night, he wanted to know if we could recheck his hemoglobin to see if he needs to get more blood at hospital,    Here for recheck.  Was seen here recently for blood in stool, anemia, followed by subsequent hospitalization for GI bleed and anemia.  He ended up getting IV iron and blood transfusions.  Felt ok but had a repeat episode of a lot of bright red blood in toilet bowel and on paper last night once.  Felt like loose stool coming out.   None since last night.  Wanted to recheck CBC today  Is suppose to see Dr. Nyra Market soon but hasn't heard back from them yet.  He is taking oral iron once daily and Protonix BID.  No fatigue or weakness or lightheadedness No other aggravating or relieving factors.    No other c/o.  Past Medical History:  Diagnosis Date   Dyslipidemia    ED (erectile dysfunction)    Hyperlipidemia    Hyperparathyroidism    Hypertension    Hypogonadism, male    Sleep apnea    Current Outpatient Medications on File Prior to Visit  Medication Sig Dispense Refill   Accu-Chek FastClix Lancets MISC 1 each by Does not apply route daily. 100 each 3   amLODipine (NORVASC) 10 MG tablet Take 1 tablet (10 mg total) by mouth daily. 90 tablet 3   fish oil-omega-3 fatty acids 1000 MG capsule Take 2 g by mouth daily.     Glucosamine-Chondroit-Vit C-Mn (GLUCOSAMINE 1500 COMPLEX) CAPS Take 1 capsule by mouth daily.      glucose blood (ACCU-CHEK GUIDE) test strip Use one daily to check blood sugar 100 strip 12   Iron, Ferrous Sulfate, 325 (65 Fe) MG TABS Take 65 mg by mouth daily with breakfast. (Patient taking differently: Take 130 mg by mouth daily with breakfast.) 30 tablet 2   Multiple Vitamins-Minerals (MULTIVITAMIN WITH MINERALS) tablet Take 1  tablet by mouth daily.     pantoprazole (PROTONIX) 40 MG tablet Take 1 tablet (40 mg total) by mouth 2 (two) times daily. 60 tablet 0   PRESCRIPTION MEDICATION CPAP- At bedtime     rosuvastatin (CRESTOR) 40 MG tablet Take 1 tablet (40 mg total) by mouth daily. (Patient taking differently: Take 40 mg by mouth at bedtime.) 90 tablet 3   Testosterone 20.25 MG/ACT (1.62%) GEL APPLY 3 PUMPS TO THE SHOULDER AND ARM DAILY (Patient taking differently: Place 3 Pump onto the skin See admin instructions. APPLY 3 PUMPS TO THE SHOULDER AND ARM DAILY) 150 g 0   valsartan-hydrochlorothiazide (DIOVAN-HCT) 320-12.5 MG tablet Take 1 tablet by mouth daily. 90 tablet 3   EPINEPHrine (EPI-PEN) 0.3 mg/0.3 mL DEVI Inject 0.3 mLs (0.3 mg total) into the muscle once. (Patient not taking: Reported on 08/14/2022) 1 Device 0   No current facility-administered medications on file prior to visit.     The following portions of the patient's history were reviewed and updated as appropriate: allergies, current medications, past family history, past medical history, past social history, past surgical history and problem list.  ROS Otherwise as in subjective above    Objective: BP 130/72   Pulse 78   Wt 223 lb (101.2 kg)   BMI 31.10 kg/m  Wt Readings from Last 3 Encounters:  08/18/22 223 lb (101.2 kg)  08/14/22 227 lb (103 kg)  08/08/22 225 lb 1.4 oz (102.1 kg)   General appearance: alert, no distress, well developed, well nourished Neck: supple, no lymphadenopathy, no thyromegaly, no masses Heart: RRR, normal S1, S2, no murmurs Lungs: CTA bilaterally, no wheezes, rhonchi, or rales Abdomen: +bs, soft, non tender, non distended, no masses, no hepatomegaly, no splenomegaly Pulses: 2+ radial pulses, 2+ pedal pulses, normal cap refill Ext: no edema   Assessment: Encounter Diagnoses  Name Primary?   Anemia, unspecified type Yes   Blood in stool      Plan: I reviewed his recent hospital discharge summary from  08/09/2022.  He recede red blood cells on 08/06/2021, IV iron x 2, was advised to stop NSAID use.  The bleeding was thought to be related to diverticular bleeding.  Orthostatics stable.  Hemoglobin stable today 9.2 POCT compared to 9.6 on 08/14/22.  CBC to confirm today.  I will correspond with GI office as to get him scheduled soon as he hasn't heard back from them since his recent hospitalization.   Braison was seen today for other.  Diagnoses and all orders for this visit:  Anemia, unspecified type -     CBC -     Hemoglobin -     Orthostatic vital signs  Blood in stool -     CBC -     Hemoglobin -     Orthostatic vital signs    Follow up: pending labs, GI follow up

## 2022-08-19 NOTE — Progress Notes (Signed)
Notified Dr. Bosie Clos office and they have already reached out to him and he returned there call but nurse was busy, so he is waiting her call back

## 2022-08-19 NOTE — Progress Notes (Signed)
Call patient and see if any more blood in stool Monday?   See message from yesterday.  Fax over copy of my note and last few CBCs to Dr. Bosie Clos.  Please call [facility], speak to Dr. Bosie Clos nurse to see when they can get him in.  He has another episode of blood in stool Sunday since hospital discharge.

## 2022-08-22 DIAGNOSIS — K5730 Diverticulosis of large intestine without perforation or abscess without bleeding: Secondary | ICD-10-CM | POA: Diagnosis not present

## 2022-08-22 DIAGNOSIS — K5140 Inflammatory polyps of colon without complications: Secondary | ICD-10-CM | POA: Diagnosis not present

## 2022-08-22 DIAGNOSIS — K648 Other hemorrhoids: Secondary | ICD-10-CM | POA: Diagnosis not present

## 2022-08-22 DIAGNOSIS — D124 Benign neoplasm of descending colon: Secondary | ICD-10-CM | POA: Diagnosis not present

## 2022-08-22 DIAGNOSIS — K921 Melena: Secondary | ICD-10-CM | POA: Diagnosis not present

## 2022-08-22 LAB — HM COLONOSCOPY

## 2022-08-24 ENCOUNTER — Encounter: Payer: Self-pay | Admitting: Family Medicine

## 2022-08-27 ENCOUNTER — Encounter: Payer: Self-pay | Admitting: Internal Medicine

## 2022-08-27 DIAGNOSIS — K5140 Inflammatory polyps of colon without complications: Secondary | ICD-10-CM | POA: Diagnosis not present

## 2022-08-27 DIAGNOSIS — D124 Benign neoplasm of descending colon: Secondary | ICD-10-CM | POA: Diagnosis not present

## 2022-08-27 NOTE — Telephone Encounter (Signed)
Thanks for sending over the colonoscopy report  I am sorry you had to cancel your trip. I hope you were able to get a credit or refund on your trip costs.  Medical emergencies /issues such as your recent anemia is a valid reason to ask for refund or some type of resolution with the airlines/hotels, etc.  I hope you can get back on track to take your trip.

## 2022-09-03 DIAGNOSIS — M47812 Spondylosis without myelopathy or radiculopathy, cervical region: Secondary | ICD-10-CM | POA: Diagnosis not present

## 2022-09-03 DIAGNOSIS — M5388 Other specified dorsopathies, sacral and sacrococcygeal region: Secondary | ICD-10-CM | POA: Diagnosis not present

## 2022-09-03 DIAGNOSIS — M9903 Segmental and somatic dysfunction of lumbar region: Secondary | ICD-10-CM | POA: Diagnosis not present

## 2022-09-03 DIAGNOSIS — M9901 Segmental and somatic dysfunction of cervical region: Secondary | ICD-10-CM | POA: Diagnosis not present

## 2022-09-03 DIAGNOSIS — M47816 Spondylosis without myelopathy or radiculopathy, lumbar region: Secondary | ICD-10-CM | POA: Diagnosis not present

## 2022-09-03 DIAGNOSIS — M9904 Segmental and somatic dysfunction of sacral region: Secondary | ICD-10-CM | POA: Diagnosis not present

## 2022-09-12 ENCOUNTER — Ambulatory Visit
Admission: EM | Admit: 2022-09-12 | Discharge: 2022-09-12 | Disposition: A | Discharge status: Home / Self Care | Payer: Medicare Other

## 2022-09-12 DIAGNOSIS — L729 Follicular cyst of the skin and subcutaneous tissue, unspecified: Secondary | ICD-10-CM

## 2022-09-12 NOTE — Discharge Instructions (Addendum)
This appears to be a keloid.  Follow up with your dermatologist.

## 2022-09-12 NOTE — ED Triage Notes (Addendum)
Patient to Urgent Care with complaints of burn present to his left forearm that occurred two weeks ago. Concerned about lump that is now present to the area he burned.   Reports he burned himself on his oven when removing a pan. Has been applying neosporin. No drainage.

## 2022-09-12 NOTE — ED Provider Notes (Signed)
[name] [name]    CSN: [id number] Arrival date & time: 09/12/22  1406      History   Chief Complaint Chief Complaint  Patient presents with   Blister    Burn that has now blistered - Entered by patient    HPI [name] [name] is a 72 y.o. male.  Patient presents with a burn on his left arm x 2 weeks that has turned into a cyst on his skin.  The burn occurred when he accidentally touched his arm on the stove.  He has been treating it with Neosporin.  He keeps accidentally bumping it because of the location and it hurts when he does.  No fever, wound drainage, erythema, or other symptoms.  His medical history includes diabetes and hypertension.    The history is provided by the patient and medical records.    Past Medical History:  Diagnosis Date   Dyslipidemia    ED (erectile dysfunction)    Hyperlipidemia    Hyperparathyroidism    Hypertension    Hypogonadism, male    Sleep apnea     Patient Active Problem List   Diagnosis Date Noted   GI bleed 08/07/2022   Squamous cell carcinoma in situ (SCCIS) of dorsum of hand 06/17/2022   Diabetic nephropathy associated with type 2 diabetes mellitus (HCC) 10/09/2021   Squamous cell carcinoma in situ (SCCIS) of skin of chest 08/21/2021   Type 2 diabetes mellitus without complication, without long-term current use of insulin (HCC) 10/16/2020   Aortic atherosclerosis (HCC) 10/16/2020   History of GI diverticular bleed 02/14/2020   Diverticulosis 11/03/2019   Spinal stenosis 10/06/2019   Hypertension associated with diabetes (HCC) 01/17/2011   Hyperlipidemia associated with type 2 diabetes mellitus (HCC) 01/17/2011   Hypogonadism male 01/17/2011   Sleep apnea 01/17/2011   History of hyperparathyroidism 01/17/2011   ED (erectile dysfunction) 01/17/2011   Obesity (BMI 30-39.9) 01/17/2011    Past Surgical History:  Procedure Laterality Date   PARATHYROIDECTOMY     SKIN BIOPSY Left 03/02/2019   squamous cell carcinoma in  situ, hypertrophic, traumatized   SKIN BIOPSY Right 08/01/2021   residual squamous cell caarcinoma       Home Medications    Prior to Admission medications   Medication Sig Start Date End Date Taking? Authorizing Provider  Accu-Chek FastClix Lancets MISC 1 each by Does not apply route daily. 10/16/20   [name], MD  amLODipine (NORVASC) 10 MG tablet Take 1 tablet (10 mg total) by mouth daily. 10/09/21   [name], MD  EPINEPHrine (EPI-PEN) 0.3 mg/0.3 mL DEVI Inject 0.3 mLs (0.3 mg total) into the muscle once. Patient not taking: Reported on 08/14/2022 03/14/14   [name], MD  fish oil-omega-3 fatty acids 1000 MG capsule Take 2 g by mouth daily.    [provider]  Glucosamine-Chondroit-Vit C-Mn (GLUCOSAMINE 1500 COMPLEX) CAPS Take 1 capsule by mouth daily.     [provider]  glucose blood (ACCU-CHEK GUIDE) test strip Use one daily to check blood sugar 10/16/20   [name], MD  Iron, Ferrous Sulfate, 325 (65 Fe) MG TABS Take 65 mg by mouth daily with breakfast. Patient taking differently: Take 130 mg by mouth daily with breakfast. 08/09/22 11/07/22  [place], Alvira Philips, DO  Multiple Vitamins-Minerals (MULTIVITAMIN WITH MINERALS) tablet Take 1 tablet by mouth daily.    [provider]  pantoprazole (PROTONIX) 40 MG tablet Take 1 tablet (40 mg total) by mouth 2 (  two) times daily. 08/09/22 09/08/22  [place], Eric J, DO  PRESCRIPTION MEDICATION CPAP- At bedtime    [provider]  rosuvastatin (CRESTOR) 40 MG tablet Take 1 tablet (40 mg total) by mouth daily. Patient taking differently: Take 40 mg by mouth at bedtime. 10/10/21   [name], MD  Testosterone 20.25 MG/ACT (1.62%) GEL APPLY 3 PUMPS TO THE SHOULDER AND ARM DAILY Patient taking differently: Place 3 Pump onto the skin See admin instructions. APPLY 3 PUMPS TO THE SHOULDER AND ARM DAILY 07/01/22   [name], MD  valsartan-hydrochlorothiazide (DIOVAN-HCT) 320-12.5 MG tablet  Take 1 tablet by mouth daily. 10/09/21   [name], MD    Family History Family History  Problem Relation Age of Onset   Hypertension Father    Hyperlipidemia Father     Social History Social History   Tobacco Use   Smoking status: Never   Smokeless tobacco: Never  Vaping Use   Vaping Use: Never used  Substance Use Topics   Alcohol use: Yes    Alcohol/week: 14.0 standard drinks of alcohol    Types: 14 Cans of beer per week   Drug use: No     Allergies   Yellow jacket venom   Review of Systems Review of Systems  Constitutional:  Negative for chills and fever.  Musculoskeletal:  Negative for arthralgias and joint swelling.  Skin:  Positive for color change and wound.  Neurological:  Negative for weakness and numbness.     Physical Exam Triage Vital Signs ED Triage Vitals  Enc Vitals Group     BP      Pulse      Resp      Temp      Temp src      SpO2      Weight      Height      Head Circumference      Peak Flow      Pain Score      Pain Loc      Pain Edu?      Excl. in GC?    No data found.  Updated Vital Signs BP 136/71   Pulse 67   Temp 98.5 F (36.9 C)   Resp 15   SpO2 98%   Visual Acuity Right Eye Distance:   Left Eye Distance:   Bilateral Distance:    Right Eye Near:   Left Eye Near:    Bilateral Near:     Physical Exam Constitutional:      General: He is not in acute distress. HENT:     Mouth/Throat:     Mouth: Mucous membranes are moist.  Cardiovascular:     Rate and Rhythm: Normal rate and regular rhythm.     Heart sounds: Normal heart sounds.  Pulmonary:     Effort: Pulmonary effort is normal. No respiratory distress.     Breath sounds: Normal breath sounds.  Musculoskeletal:        General: No swelling or deformity. Normal range of motion.  Skin:    General: Skin is warm and dry.     Capillary Refill: Capillary refill takes less than 2 seconds.     Findings: Lesion present. No erythema.     Comments: Soft  purple-red cyst on left forearm.  No erythema or drainage.  See picture.  Needle aspiration with return of blood only.    Neurological:     General: No focal deficit present.  Mental Status: He is alert and oriented to person, place, and time.     Sensory: No sensory deficit.     Motor: No weakness.  Psychiatric:        Mood and Affect: Mood normal.        Behavior: Behavior normal.      UC Treatments / Results  Labs (all labs ordered are listed, but only abnormal results are displayed) Labs Reviewed - No data to display  EKG   Radiology No results found.  Procedures Procedures (including critical care time)  Medications Ordered in UC Medications - No data to display  Initial Impression / Assessment and Plan / UC Course  I have reviewed the triage vital signs and the nursing notes.  Pertinent labs & imaging results that were available during my care of the patient were reviewed by me and considered in my medical decision making (see chart for details).    Skin cyst on left forearm.  This appears to be a keloid.  It came up after he accidentally burned the area on his stove 2 weeks ago.  No indication of infection.  Discussed following up with his dermatologist.  (He has a dermatologist that he sees in [place].)  He agrees to plan of care.  Final Clinical Impressions(s) / UC Diagnoses   Final diagnoses:  Cyst of skin     Discharge Instructions      This appears to be a keloid.  Follow up with your dermatologist.      ED Prescriptions   None    PDMP not reviewed this encounter.   Mickie Bail, NP 09/12/22 1455

## 2022-09-16 DIAGNOSIS — C44629 Squamous cell carcinoma of skin of left upper limb, including shoulder: Secondary | ICD-10-CM | POA: Diagnosis not present

## 2022-09-24 ENCOUNTER — Ambulatory Visit: Payer: Medicare Other | Admitting: Family Medicine

## 2022-09-24 VITALS — BP 122/80 | HR 64 | Wt 227.0 lb

## 2022-09-24 DIAGNOSIS — E118 Type 2 diabetes mellitus with unspecified complications: Secondary | ICD-10-CM

## 2022-09-24 DIAGNOSIS — E1159 Type 2 diabetes mellitus with other circulatory complications: Secondary | ICD-10-CM

## 2022-09-24 DIAGNOSIS — E291 Testicular hypofunction: Secondary | ICD-10-CM | POA: Diagnosis not present

## 2022-09-24 DIAGNOSIS — E669 Obesity, unspecified: Secondary | ICD-10-CM | POA: Diagnosis not present

## 2022-09-24 DIAGNOSIS — E785 Hyperlipidemia, unspecified: Secondary | ICD-10-CM

## 2022-09-24 DIAGNOSIS — Z8719 Personal history of other diseases of the digestive system: Secondary | ICD-10-CM

## 2022-09-24 DIAGNOSIS — E119 Type 2 diabetes mellitus without complications: Secondary | ICD-10-CM

## 2022-09-24 DIAGNOSIS — E1121 Type 2 diabetes mellitus with diabetic nephropathy: Secondary | ICD-10-CM

## 2022-09-24 DIAGNOSIS — I152 Hypertension secondary to endocrine disorders: Secondary | ICD-10-CM | POA: Diagnosis not present

## 2022-09-24 DIAGNOSIS — G4730 Sleep apnea, unspecified: Secondary | ICD-10-CM

## 2022-09-24 DIAGNOSIS — E1169 Type 2 diabetes mellitus with other specified complication: Secondary | ICD-10-CM | POA: Diagnosis not present

## 2022-09-24 LAB — POCT GLYCOSYLATED HEMOGLOBIN (HGB A1C): Hemoglobin A1C: 6.7 % — AB (ref 4.0–5.6)

## 2022-09-24 NOTE — Progress Notes (Signed)
Subjective:    Patient ID: [name] [name], male    DOB: 01-05-1951, 72 y.o.   MRN: [id number]  [name] [name] is a 72 y.o. male who presents for follow-up of Type 2 diabetes mellitus.  Patient is checking home blood sugars.   Home blood sugar records: 180s How often is blood sugars being checked: couple times a week Current symptoms/problems include none and have been stable. Daily foot checks: no   Any foot concerns: none Last eye exam: November 2023 [facility] Exercise:  all the time He does have concerns about continued difficulty with diverticular bleed.  He would like another hemoglobin test done.  He has had several admissions because of this with no real bleeding site noted.  He is seen no bloody stools recently.  He wonders if he needs to continue on the iron.  He is on no diabetes medications.  Is presently taking rosuvastatin, valsartan/HCTZ as well as testosterone 3 pumps per day. The following portions of the patient's history were reviewed and updated as appropriate: allergies, current medications, past medical history, past social history and problem list.  ROS as in subjective above.     Objective:    Physical Exam Alert and in no distress otherwise not examined. Hemoglobin A1c is 6.7. Blood pressure 122/80, pulse 64, weight 227 lb (103 kg).  Lab Review    Latest Ref Rng & Units 09/24/2022   11:43 AM 08/14/2022   12:56 PM 08/09/2022    5:43 AM 08/08/2022    3:25 PM 08/08/2022    1:41 AM  Diabetic Labs  HbA1c 4.0 - 5.6 % 6.7       Creatinine 0.76 - 1.27 mg/dL  7.82  9.56  2.13  0.86       09/24/2022   11:02 AM 09/12/2022    2:17 PM 08/18/2022    3:47 PM 08/18/2022    3:46 PM 08/18/2022    3:45 PM  BP/Weight  Systolic BP 122 136 124 130 130  Diastolic BP 80 71 90 76 80  Wt. (Lbs) 227      BMI 31.66 kg/m2          Latest Ref Rng & Units 02/13/2022   12:00 AM 06/12/2021   11:15 AM  Foot/eye exam completion dates  Eye Exam No Retinopathy No Retinopathy        Foot Form Completion   Done     This result is from an external source.    Danial  reports that he has never smoked. He has never used smokeless tobacco. He reports current alcohol use of about 14.0 standard drinks of alcohol per week. He reports that he does not use drugs.     Assessment & Plan:    Type 2 diabetes with complication (HCC) - Plan: POCT glycosylated hemoglobin (Hb A1C), CANCELED: HgB A1c  Diabetic nephropathy associated with type 2 diabetes mellitus (HCC)  Hyperlipidemia associated with type 2 diabetes mellitus (HCC)  Hypertension associated with diabetes (HCC)  Hypogonadism male - Plan: Testosterone  Obesity (BMI 30-39.9)  History of GI diverticular bleed - Plan: CBC with Differential/Platelet I discussed the diverticular bleed in detail with him.  If he continues to have evidence of bleeding, further evaluation will be needed. Discussed the natural history of diabetes with him and encouraged him to continue with his present diet and exercise however explained that in spite of this sometimes the diabetes can get worse.  He expressed understanding of this.  Recheck here in  4 months.

## 2022-09-25 LAB — CBC WITH DIFFERENTIAL/PLATELET
Basophils Absolute: 0.1 10*3/uL (ref 0.0–0.2)
Basos: 1 %
EOS (ABSOLUTE): 0.2 10*3/uL (ref 0.0–0.4)
Eos: 3 %
Hematocrit: 39 % (ref 37.5–51.0)
Hemoglobin: 12.1 g/dL — ABNORMAL LOW (ref 13.0–17.7)
Immature Grans (Abs): 0 10*3/uL (ref 0.0–0.1)
Immature Granulocytes: 0 %
Lymphocytes Absolute: 1 10*3/uL (ref 0.7–3.1)
Lymphs: 17 %
MCH: 25.9 pg — ABNORMAL LOW (ref 26.6–33.0)
MCHC: 31 g/dL — ABNORMAL LOW (ref 31.5–35.7)
MCV: 84 fL (ref 79–97)
Monocytes Absolute: 0.6 10*3/uL (ref 0.1–0.9)
Monocytes: 10 %
Neutrophils Absolute: 4 10*3/uL (ref 1.4–7.0)
Neutrophils: 69 %
Platelets: 175 10*3/uL (ref 150–450)
RBC: 4.67 x10E6/uL (ref 4.14–5.80)
RDW: 14.6 % (ref 11.6–15.4)
WBC: 5.9 10*3/uL (ref 3.4–10.8)

## 2022-09-25 LAB — TESTOSTERONE: Testosterone: 512 ng/dL (ref 264–916)

## 2022-09-26 ENCOUNTER — Other Ambulatory Visit: Payer: Self-pay | Admitting: Family Medicine

## 2022-09-26 ENCOUNTER — Encounter: Payer: Self-pay | Admitting: Family Medicine

## 2022-09-26 DIAGNOSIS — E1169 Type 2 diabetes mellitus with other specified complication: Secondary | ICD-10-CM

## 2022-09-26 DIAGNOSIS — E1159 Type 2 diabetes mellitus with other circulatory complications: Secondary | ICD-10-CM

## 2022-10-02 DIAGNOSIS — M47812 Spondylosis without myelopathy or radiculopathy, cervical region: Secondary | ICD-10-CM | POA: Diagnosis not present

## 2022-10-02 DIAGNOSIS — M9903 Segmental and somatic dysfunction of lumbar region: Secondary | ICD-10-CM | POA: Diagnosis not present

## 2022-10-02 DIAGNOSIS — M5388 Other specified dorsopathies, sacral and sacrococcygeal region: Secondary | ICD-10-CM | POA: Diagnosis not present

## 2022-10-02 DIAGNOSIS — M9904 Segmental and somatic dysfunction of sacral region: Secondary | ICD-10-CM | POA: Diagnosis not present

## 2022-10-02 DIAGNOSIS — M9901 Segmental and somatic dysfunction of cervical region: Secondary | ICD-10-CM | POA: Diagnosis not present

## 2022-10-02 DIAGNOSIS — M47816 Spondylosis without myelopathy or radiculopathy, lumbar region: Secondary | ICD-10-CM | POA: Diagnosis not present

## 2022-10-04 ENCOUNTER — Other Ambulatory Visit: Payer: Self-pay | Admitting: Family Medicine

## 2022-10-04 DIAGNOSIS — E291 Testicular hypofunction: Secondary | ICD-10-CM

## 2022-10-06 NOTE — Telephone Encounter (Signed)
Is it ok to refill?   Last prescribed 07/01/2022 Last office visit: 09/24/2022

## 2022-10-17 ENCOUNTER — Ambulatory Visit: Payer: Medicare Other | Admitting: Family Medicine

## 2022-11-05 DIAGNOSIS — M9904 Segmental and somatic dysfunction of sacral region: Secondary | ICD-10-CM | POA: Diagnosis not present

## 2022-11-05 DIAGNOSIS — M9903 Segmental and somatic dysfunction of lumbar region: Secondary | ICD-10-CM | POA: Diagnosis not present

## 2022-11-05 DIAGNOSIS — M5388 Other specified dorsopathies, sacral and sacrococcygeal region: Secondary | ICD-10-CM | POA: Diagnosis not present

## 2022-11-05 DIAGNOSIS — M47816 Spondylosis without myelopathy or radiculopathy, lumbar region: Secondary | ICD-10-CM | POA: Diagnosis not present

## 2022-11-05 DIAGNOSIS — M9902 Segmental and somatic dysfunction of thoracic region: Secondary | ICD-10-CM | POA: Diagnosis not present

## 2022-11-05 DIAGNOSIS — M47814 Spondylosis without myelopathy or radiculopathy, thoracic region: Secondary | ICD-10-CM | POA: Diagnosis not present

## 2022-11-06 ENCOUNTER — Encounter: Payer: Self-pay | Admitting: Family Medicine

## 2022-12-11 DIAGNOSIS — M9904 Segmental and somatic dysfunction of sacral region: Secondary | ICD-10-CM | POA: Diagnosis not present

## 2022-12-11 DIAGNOSIS — M9901 Segmental and somatic dysfunction of cervical region: Secondary | ICD-10-CM | POA: Diagnosis not present

## 2022-12-11 DIAGNOSIS — M5388 Other specified dorsopathies, sacral and sacrococcygeal region: Secondary | ICD-10-CM | POA: Diagnosis not present

## 2022-12-11 DIAGNOSIS — M9903 Segmental and somatic dysfunction of lumbar region: Secondary | ICD-10-CM | POA: Diagnosis not present

## 2022-12-11 DIAGNOSIS — M47816 Spondylosis without myelopathy or radiculopathy, lumbar region: Secondary | ICD-10-CM | POA: Diagnosis not present

## 2022-12-11 DIAGNOSIS — M47812 Spondylosis without myelopathy or radiculopathy, cervical region: Secondary | ICD-10-CM | POA: Diagnosis not present

## 2022-12-15 DIAGNOSIS — Z23 Encounter for immunization: Secondary | ICD-10-CM | POA: Diagnosis not present

## 2022-12-22 DIAGNOSIS — R208 Other disturbances of skin sensation: Secondary | ICD-10-CM | POA: Diagnosis not present

## 2022-12-22 DIAGNOSIS — L821 Other seborrheic keratosis: Secondary | ICD-10-CM | POA: Diagnosis not present

## 2022-12-22 DIAGNOSIS — D0359 Melanoma in situ of other part of trunk: Secondary | ICD-10-CM | POA: Diagnosis not present

## 2022-12-22 DIAGNOSIS — L814 Other melanin hyperpigmentation: Secondary | ICD-10-CM | POA: Diagnosis not present

## 2022-12-22 DIAGNOSIS — L578 Other skin changes due to chronic exposure to nonionizing radiation: Secondary | ICD-10-CM | POA: Diagnosis not present

## 2022-12-22 DIAGNOSIS — L538 Other specified erythematous conditions: Secondary | ICD-10-CM | POA: Diagnosis not present

## 2022-12-22 DIAGNOSIS — D225 Melanocytic nevi of trunk: Secondary | ICD-10-CM | POA: Diagnosis not present

## 2022-12-22 DIAGNOSIS — Z85828 Personal history of other malignant neoplasm of skin: Secondary | ICD-10-CM | POA: Diagnosis not present

## 2022-12-22 DIAGNOSIS — D492 Neoplasm of unspecified behavior of bone, soft tissue, and skin: Secondary | ICD-10-CM | POA: Diagnosis not present

## 2022-12-22 DIAGNOSIS — L570 Actinic keratosis: Secondary | ICD-10-CM | POA: Diagnosis not present

## 2022-12-22 DIAGNOSIS — Z08 Encounter for follow-up examination after completed treatment for malignant neoplasm: Secondary | ICD-10-CM | POA: Diagnosis not present

## 2022-12-31 DIAGNOSIS — D0359 Melanoma in situ of other part of trunk: Secondary | ICD-10-CM | POA: Diagnosis not present

## 2022-12-31 DIAGNOSIS — D485 Neoplasm of uncertain behavior of skin: Secondary | ICD-10-CM | POA: Diagnosis not present

## 2023-01-15 ENCOUNTER — Ambulatory Visit: Payer: Medicare Other | Admitting: Family Medicine

## 2023-01-20 DIAGNOSIS — L578 Other skin changes due to chronic exposure to nonionizing radiation: Secondary | ICD-10-CM | POA: Diagnosis not present

## 2023-01-20 DIAGNOSIS — D485 Neoplasm of uncertain behavior of skin: Secondary | ICD-10-CM | POA: Diagnosis not present

## 2023-01-20 DIAGNOSIS — L244 Irritant contact dermatitis due to drugs in contact with skin: Secondary | ICD-10-CM | POA: Diagnosis not present

## 2023-01-20 DIAGNOSIS — L905 Scar conditions and fibrosis of skin: Secondary | ICD-10-CM | POA: Diagnosis not present

## 2023-02-03 DIAGNOSIS — E119 Type 2 diabetes mellitus without complications: Secondary | ICD-10-CM | POA: Diagnosis not present

## 2023-02-03 DIAGNOSIS — D039 Melanoma in situ, unspecified: Secondary | ICD-10-CM | POA: Insufficient documentation

## 2023-02-03 DIAGNOSIS — H524 Presbyopia: Secondary | ICD-10-CM | POA: Diagnosis not present

## 2023-02-03 DIAGNOSIS — H5213 Myopia, bilateral: Secondary | ICD-10-CM | POA: Diagnosis not present

## 2023-02-03 LAB — HM DIABETES EYE EXAM

## 2023-02-04 ENCOUNTER — Ambulatory Visit (INDEPENDENT_AMBULATORY_CARE_PROVIDER_SITE_OTHER): Payer: Medicare Other | Admitting: Family Medicine

## 2023-02-04 ENCOUNTER — Encounter: Payer: Self-pay | Admitting: Family Medicine

## 2023-02-04 VITALS — BP 120/70 | HR 84 | Ht 71.0 in | Wt 219.2 lb

## 2023-02-04 DIAGNOSIS — Z8639 Personal history of other endocrine, nutritional and metabolic disease: Secondary | ICD-10-CM

## 2023-02-04 DIAGNOSIS — I152 Hypertension secondary to endocrine disorders: Secondary | ICD-10-CM | POA: Diagnosis not present

## 2023-02-04 DIAGNOSIS — E785 Hyperlipidemia, unspecified: Secondary | ICD-10-CM | POA: Diagnosis not present

## 2023-02-04 DIAGNOSIS — E291 Testicular hypofunction: Secondary | ICD-10-CM | POA: Diagnosis not present

## 2023-02-04 DIAGNOSIS — E1121 Type 2 diabetes mellitus with diabetic nephropathy: Secondary | ICD-10-CM | POA: Diagnosis not present

## 2023-02-04 DIAGNOSIS — D038 Melanoma in situ of other sites: Secondary | ICD-10-CM | POA: Diagnosis not present

## 2023-02-04 DIAGNOSIS — E1169 Type 2 diabetes mellitus with other specified complication: Secondary | ICD-10-CM | POA: Diagnosis not present

## 2023-02-04 DIAGNOSIS — G4730 Sleep apnea, unspecified: Secondary | ICD-10-CM | POA: Diagnosis not present

## 2023-02-04 DIAGNOSIS — E669 Obesity, unspecified: Secondary | ICD-10-CM

## 2023-02-04 DIAGNOSIS — Z8719 Personal history of other diseases of the digestive system: Secondary | ICD-10-CM

## 2023-02-04 DIAGNOSIS — K219 Gastro-esophageal reflux disease without esophagitis: Secondary | ICD-10-CM

## 2023-02-04 DIAGNOSIS — Z0000 Encounter for general adult medical examination without abnormal findings: Secondary | ICD-10-CM

## 2023-02-04 DIAGNOSIS — E118 Type 2 diabetes mellitus with unspecified complications: Secondary | ICD-10-CM

## 2023-02-04 DIAGNOSIS — N5201 Erectile dysfunction due to arterial insufficiency: Secondary | ICD-10-CM

## 2023-02-04 DIAGNOSIS — E1159 Type 2 diabetes mellitus with other circulatory complications: Secondary | ICD-10-CM | POA: Diagnosis not present

## 2023-02-04 DIAGNOSIS — I700 Atherosclerosis of aorta: Secondary | ICD-10-CM | POA: Diagnosis not present

## 2023-02-04 DIAGNOSIS — Z23 Encounter for immunization: Secondary | ICD-10-CM

## 2023-02-04 LAB — POCT GLYCOSYLATED HEMOGLOBIN (HGB A1C): Hemoglobin A1C: 8 % — AB (ref 4.0–5.6)

## 2023-02-04 MED ORDER — DAPAGLIFLOZIN PROPANEDIOL 5 MG PO TABS: 5.0000 mg | ORAL_TABLET | Freq: Every day | ORAL | 1 refills | Status: DC

## 2023-02-04 MED ORDER — TADALAFIL 20 MG PO TABS: 20.0000 mg | ORAL_TABLET | Freq: Every day | ORAL | 5 refills | Status: DC | PRN

## 2023-02-04 NOTE — Progress Notes (Signed)
   Subjective:    Patient ID: [name] [name], male    DOB: 14-Jan-1951, 72 y.o.   MRN: [id number]  HPI He is here for a medication check.  He has been doing his diet and exercise to help control his diabetes and wants to stay off medications.  He has lost 20 pounds.  He continues on amlodipine as well as valsartan/HCTZ.  He is also continuing on Crestor.  Does have a previous history of GI bleed but has not had any black tarry stools, nausea or vomiting.  He has a remote history of hyperparathyroid with removal of one of the glands.  He does have underlying ED and would like to try Cialis.  He continues on testosterone and is having no difficulty with that.  He also has melanoma in situ and does get regular dermatologic follow-ups on that.  He gets good readouts on his CPAP.  He does have concerns over getting life insurance and being denied based on his diabetes as well as nephropathy.  He has a history of reflux but has not had any trouble with that recently.   Review of Systems     Objective:    Physical Exam Alert and in no distress. Tympanic membranes and canals are normal. Pharyngeal area is normal. Neck is supple without adenopathy or thyromegaly. Cardiac exam shows a regular sinus rhythm without murmurs or gallops. Lungs are clear to auscultation.  Diabetic foot exam done and is normal. Hemoglobin A1c is 8.0.       Assessment & Plan:  Controlled type 2 diabetes mellitus with complication, without long-term current use of insulin (HCC) - Plan: Lipid panel, CBC with Differential/Platelet, Comprehensive metabolic panel, POCT glycosylated hemoglobin (Hb A1C), CANCELED: POCT UA - Microalbumin  Aortic atherosclerosis (HCC)  Diabetic nephropathy associated with type 2 diabetes mellitus (HCC)  Erectile dysfunction due to arterial insufficiency - Plan: tadalafil (CIALIS) 20 MG tablet  History of hyperparathyroidism  Hyperlipidemia associated with type 2 diabetes mellitus (HCC) - Plan: Lipid  panel  Hypertension associated with diabetes (HCC) - Plan: CBC with Differential/Platelet, Comprehensive metabolic panel  Hypogonadism male - Plan: Testosterone  Obesity (BMI 30-39.9)  Sleep apnea, unspecified type  Melanoma in situ of other site (HCC)  Gastroesophageal reflux disease without esophagitis  History of GI diverticular bleed  I discussed the issue with his life insurance explaining that I did not explain about his renal function in regard to microalbumin.  Also explained the fact that since he is a diabetic life insurance becomes much more difficult to obtain.  Also discussed the fact that it is now time to be more aggressive with his diabetes and I will place him on Farxiga.  Discussed possible side effects of this medication.  Continue on present medication regimen and recheck here in 4 months.  He was comfortable with that.

## 2023-02-05 ENCOUNTER — Other Ambulatory Visit: Payer: Self-pay | Admitting: Family Medicine

## 2023-02-05 DIAGNOSIS — M9903 Segmental and somatic dysfunction of lumbar region: Secondary | ICD-10-CM | POA: Diagnosis not present

## 2023-02-05 DIAGNOSIS — M5388 Other specified dorsopathies, sacral and sacrococcygeal region: Secondary | ICD-10-CM | POA: Diagnosis not present

## 2023-02-05 DIAGNOSIS — M9904 Segmental and somatic dysfunction of sacral region: Secondary | ICD-10-CM | POA: Diagnosis not present

## 2023-02-05 DIAGNOSIS — M47813 Spondylosis without myelopathy or radiculopathy, cervicothoracic region: Secondary | ICD-10-CM | POA: Diagnosis not present

## 2023-02-05 DIAGNOSIS — M47816 Spondylosis without myelopathy or radiculopathy, lumbar region: Secondary | ICD-10-CM | POA: Diagnosis not present

## 2023-02-05 DIAGNOSIS — M9901 Segmental and somatic dysfunction of cervical region: Secondary | ICD-10-CM | POA: Diagnosis not present

## 2023-02-05 DIAGNOSIS — E291 Testicular hypofunction: Secondary | ICD-10-CM

## 2023-02-05 LAB — CBC WITH DIFFERENTIAL/PLATELET
Basophils Absolute: 0.1 10*3/uL (ref 0.0–0.2)
Basos: 1 %
EOS (ABSOLUTE): 0.1 10*3/uL (ref 0.0–0.4)
Eos: 2 %
Hematocrit: 45.9 % (ref 37.5–51.0)
Hemoglobin: 15 g/dL (ref 13.0–17.7)
Immature Grans (Abs): 0 10*3/uL (ref 0.0–0.1)
Immature Granulocytes: 0 %
Lymphocytes Absolute: 1 10*3/uL (ref 0.7–3.1)
Lymphs: 14 %
MCH: 28.6 pg (ref 26.6–33.0)
MCHC: 32.7 g/dL (ref 31.5–35.7)
MCV: 87 fL (ref 79–97)
Monocytes Absolute: 0.6 10*3/uL (ref 0.1–0.9)
Monocytes: 8 %
Neutrophils Absolute: 5.5 10*3/uL (ref 1.4–7.0)
Neutrophils: 75 %
Platelets: 137 10*3/uL — ABNORMAL LOW (ref 150–450)
RBC: 5.25 x10E6/uL (ref 4.14–5.80)
RDW: 13.2 % (ref 11.6–15.4)
WBC: 7.3 10*3/uL (ref 3.4–10.8)

## 2023-02-05 LAB — COMPREHENSIVE METABOLIC PANEL
ALT: 25 [IU]/L (ref 0–44)
AST: 26 IU/L (ref 0–40)
Albumin: 4.5 g/dL (ref 3.8–4.8)
Alkaline Phosphatase: 75 [IU]/L (ref 44–121)
BUN/Creatinine Ratio: 15 (ref 10–24)
BUN: 15 mg/dL (ref 8–27)
Bilirubin Total: 2.4 mg/dL — ABNORMAL HIGH (ref 0.0–1.2)
CO2: 26 mmol/L (ref 20–29)
Calcium: 9.2 mg/dL (ref 8.6–10.2)
Chloride: 99 mmol/L (ref 96–106)
Creatinine, Ser: 0.99 mg/dL (ref 0.76–1.27)
Globulin, Total: 2.3 g/dL (ref 1.5–4.5)
Glucose: 204 mg/dL — ABNORMAL HIGH (ref 70–99)
Potassium: 3.1 mmol/L — ABNORMAL LOW (ref 3.5–5.2)
Sodium: 142 mmol/L (ref 134–144)
Total Protein: 6.8 g/dL (ref 6.0–8.5)
eGFR: 81 mL/min/{1.73_m2} (ref >59)

## 2023-02-05 LAB — LIPID PANEL
Chol/HDL Ratio: 3 ratio (ref 0.0–5.0)
Cholesterol, Total: 160 mg/dL (ref 100–199)
HDL: 54 mg/dL (ref >39)
LDL Chol Calc (NIH): 83 mg/dL (ref 0–99)
Triglycerides: 134 mg/dL (ref 0–149)
VLDL Cholesterol Cal: 23 mg/dL (ref 5–40)

## 2023-02-05 LAB — TESTOSTERONE: Testosterone: 654 ng/dL (ref 264–916)

## 2023-02-06 ENCOUNTER — Encounter: Payer: Self-pay | Admitting: Family Medicine

## 2023-02-07 ENCOUNTER — Encounter: Payer: Self-pay | Admitting: Family Medicine

## 2023-02-09 ENCOUNTER — Encounter: Payer: Self-pay | Admitting: Family Medicine

## 2023-02-18 DIAGNOSIS — L814 Other melanin hyperpigmentation: Secondary | ICD-10-CM | POA: Diagnosis not present

## 2023-02-18 DIAGNOSIS — D225 Melanocytic nevi of trunk: Secondary | ICD-10-CM | POA: Diagnosis not present

## 2023-02-18 DIAGNOSIS — L821 Other seborrheic keratosis: Secondary | ICD-10-CM | POA: Diagnosis not present

## 2023-02-18 DIAGNOSIS — L570 Actinic keratosis: Secondary | ICD-10-CM | POA: Diagnosis not present

## 2023-02-18 DIAGNOSIS — L578 Other skin changes due to chronic exposure to nonionizing radiation: Secondary | ICD-10-CM | POA: Diagnosis not present

## 2023-03-03 DIAGNOSIS — M5388 Other specified dorsopathies, sacral and sacrococcygeal region: Secondary | ICD-10-CM | POA: Diagnosis not present

## 2023-03-03 DIAGNOSIS — M9904 Segmental and somatic dysfunction of sacral region: Secondary | ICD-10-CM | POA: Diagnosis not present

## 2023-03-03 DIAGNOSIS — M9903 Segmental and somatic dysfunction of lumbar region: Secondary | ICD-10-CM | POA: Diagnosis not present

## 2023-03-03 DIAGNOSIS — M9901 Segmental and somatic dysfunction of cervical region: Secondary | ICD-10-CM | POA: Diagnosis not present

## 2023-03-03 DIAGNOSIS — M47813 Spondylosis without myelopathy or radiculopathy, cervicothoracic region: Secondary | ICD-10-CM | POA: Diagnosis not present

## 2023-03-03 DIAGNOSIS — M47816 Spondylosis without myelopathy or radiculopathy, lumbar region: Secondary | ICD-10-CM | POA: Diagnosis not present

## 2023-03-12 ENCOUNTER — Other Ambulatory Visit: Payer: Self-pay | Admitting: Primary Care

## 2023-03-12 ENCOUNTER — Ambulatory Visit (INDEPENDENT_AMBULATORY_CARE_PROVIDER_SITE_OTHER): Payer: Medicare Other | Admitting: Primary Care

## 2023-03-12 ENCOUNTER — Encounter: Payer: Self-pay | Admitting: Primary Care

## 2023-03-12 ENCOUNTER — Telehealth: Payer: Self-pay

## 2023-03-12 VITALS — BP 156/80 | HR 65 | Temp 97.5°F | Ht 71.0 in | Wt 221.0 lb

## 2023-03-12 DIAGNOSIS — E785 Hyperlipidemia, unspecified: Secondary | ICD-10-CM

## 2023-03-12 DIAGNOSIS — E876 Hypokalemia: Secondary | ICD-10-CM

## 2023-03-12 DIAGNOSIS — E291 Testicular hypofunction: Secondary | ICD-10-CM

## 2023-03-12 DIAGNOSIS — I10 Essential (primary) hypertension: Secondary | ICD-10-CM | POA: Diagnosis not present

## 2023-03-12 DIAGNOSIS — E1165 Type 2 diabetes mellitus with hyperglycemia: Secondary | ICD-10-CM | POA: Diagnosis not present

## 2023-03-12 DIAGNOSIS — N529 Male erectile dysfunction, unspecified: Secondary | ICD-10-CM

## 2023-03-12 DIAGNOSIS — G4730 Sleep apnea, unspecified: Secondary | ICD-10-CM | POA: Diagnosis not present

## 2023-03-12 LAB — BASIC METABOLIC PANEL
BUN: 18 mg/dL (ref 6–23)
CO2: 33 meq/L — ABNORMAL HIGH (ref 19–32)
Calcium: 9.5 mg/dL (ref 8.4–10.5)
Chloride: 99 meq/L (ref 96–112)
Creatinine, Ser: 1.03 mg/dL (ref 0.40–1.50)
GFR: 72.55 mL/min (ref >60.00)
Glucose, Bld: 205 mg/dL — ABNORMAL HIGH (ref 70–99)
Potassium: 2.8 meq/L — CL (ref 3.5–5.1)
Sodium: 140 meq/L (ref 135–145)

## 2023-03-12 LAB — POCT GLYCOSYLATED HEMOGLOBIN (HGB A1C): Hemoglobin A1C: 7.5 % — AB (ref 4.0–5.6)

## 2023-03-12 LAB — MICROALBUMIN / CREATININE URINE RATIO
Creatinine,U: 70.5 mg/dL
Microalb Creat Ratio: 5.2 mg/g (ref 0.0–30.0)
Microalb, Ur: 3.7 mg/dL — ABNORMAL HIGH (ref 0.0–1.9)

## 2023-03-12 MED ORDER — POTASSIUM CHLORIDE CRYS ER 20 MEQ PO TBCR: 40.0000 meq | EXTENDED_RELEASE_TABLET | Freq: Two times a day (BID) | ORAL | 0 refills | Status: DC

## 2023-03-12 MED ORDER — VALSARTAN 320 MG PO TABS: 320.0000 mg | ORAL_TABLET | Freq: Every day | ORAL | 0 refills | Status: DC

## 2023-03-12 NOTE — Patient Instructions (Addendum)
Stop by the lab prior to leaving today. I will notify you of your results once received.   Continue to monitor your blood pressure at home and report if readings consistently remain at or above 140/90.  Regarding tadalafil (Cialis), start with 10 mg (1/2 pill) 30 to 60 minutes prior to intercourse as needed.  Schedule a lab only appointment for 3 months to recheck your A1c level.  Please schedule a follow up visit for 6 months for a diabetes and testosterone check.  It was a pleasure to meet you today! Please don't hesitate to contact me with any questions. Welcome to [facility]!

## 2023-03-12 NOTE — Assessment & Plan Note (Addendum)
Reviewed A1C from November 2024 from prior PCP which was 8.0.   Repeat A1C today as he feels that this was a lab error.  A1c today of 7.5.  We did discuss that this may not be entirely accurate given that his last A1c was checked 1 month ago. Regardless, I do believe that his A1c is improving given his weight loss and healthy lifestyle.  Repeat A1c in 3 months, follow-up in 6 months.   There is no evidence of diabetic nephropathy in this patient's chart. Will obtain urine microalbumin and renal function today.

## 2023-03-12 NOTE — Assessment & Plan Note (Signed)
Stable.  Continue CPAP nightly. 

## 2023-03-12 NOTE — Progress Notes (Signed)
Subjective:    Patient ID: [name] [name], male    DOB: Feb 21, 1951, 72 y.o.   MRN: [id number]  HPI  [name] [name] is a very pleasant 72 y.o. male who presents today who presents today to establish care and discuss the problems mentioned below. Will obtain/review records.  1) Hypertension/OSA: Currently managed on amlodipine 10 mg, valsartan-hydrochlorothiazide 320-12.5 mg daily. Also managed on CPAP machine for which he is compliant to nightly.  He checks his BP at home which runs 120/80. He denies headaches and dizziness.   BP Readings from Last 3 Encounters:  03/12/23 (!) 156/80  02/04/23 120/70  09/24/22 122/80    2) Type 2 Diabetes:   Current medications include: None. Previously prescribed Farxiga, never took the medication.   He is checking his blood glucose 0 times daily. He does have a glucometer at home.  Last A1C: 8.0 in November 2024, 6.7 in July 2024. Last Eye Exam: UTD Last Foot Exam: UTD Pneumonia Vaccination: 2017 Urine Microalbumin: Due Statin: Rosuvastatin  Dietary changes since last visit: Reducing intake in sweets, limiting portion sizes.   Diet currently consists of:  Breakfast: Bacon and eggs, yogurt, occasional cereal or biscuit Lunch: Left overs from dinner, sandwich   Dinner: Meat and veggies mostly Snacks: Infrequent. Chex mix recently.  Desserts: Infrequent. Occasional pie. Beverages: Water mostly, beer (2-3 cans) nightly.   Exercise: Active   Last lipid panel was completed in November 2024 with LDL of 83.  History of aortic atherosclerosis.  He was recently denied for life insurance due to a diagnosis of diabetic nephropathy associated with type 2 diabetes.  He denies a prior history of this and has never been told he has diabetic nephropathy.  His last urine microalbumin test was negative in July 2023.  His last urine microalbumin was completed in July 2023 which was negative.  He has no history of CKD.  Wt Readings from Last 3 Encounters:   03/12/23 221 lb (100.2 kg)  02/04/23 219 lb 3.2 oz (99.4 kg)  09/24/22 227 lb (103 kg)     3) Testosterone Deficiency: Currently managed on testosterone 1.62% gel and uses 3 pumps to the shoulder and arm daily. Also managed on tadalafil 20 mg daily for erectile dysfunction for which was recently prescribed. He denies urinary frequency, weak urinary stream, nocturia.   His testosterone levels were checked by his prior PCP in November 2024, level of 654.  CBC within normal limits.    Review of Systems  Respiratory:  Negative for shortness of breath.   Cardiovascular:  Negative for chest pain.  Neurological:  Negative for dizziness, numbness and headaches.         Past Medical History:  Diagnosis Date   Dyslipidemia    ED (erectile dysfunction)    Hyperlipidemia    Hyperparathyroidism    Hypertension    Hypogonadism, male    Sleep apnea     Social History   Socioeconomic History   Marital status: Married    Spouse name: Not on file   Number of children: Not on file   Years of education: Not on file   Highest education level: Not on file  Occupational History   Not on file  Tobacco Use   Smoking status: Never   Smokeless tobacco: Never  Vaping Use   Vaping status: Never Used  Substance and Sexual Activity   Alcohol use: Yes    Alcohol/week: 14.0 standard drinks of alcohol    Types: 14 Cans  of beer per week   Drug use: No   Sexual activity: Yes  Other Topics Concern   Not on file  Social History Narrative   Not on file   Social Drivers of Health   Financial Resource Strain: Low Risk  (03/25/2022)   Overall Financial Resource Strain (CARDIA)    Difficulty of Paying Living Expenses: Not hard at all  Food Insecurity: No Food Insecurity (08/13/2022)   Hunger Vital Sign    Worried About Running Out of Food in the Last Year: Never true    Ran Out of Food in the Last Year: Never true  Transportation Needs: No Transportation Needs (08/13/2022)   PRAPARE -  Administrator, Civil Service (Medical): No    Lack of Transportation (Non-Medical): No  Physical Activity: Sufficiently Active (03/25/2022)   Exercise Vital Sign    Days of Exercise per Week: 4 days    Minutes of Exercise per Session: 50 min  Stress: No Stress Concern Present (03/25/2022)   [employer] of Occupational Health - Occupational Stress Questionnaire    Feeling of Stress : Not at all  Social Connections: Not on file  Intimate Partner Violence: Not At Risk (08/07/2022)   Humiliation, Afraid, Rape, and Kick questionnaire    Fear of Current or Ex-Partner: No    Emotionally Abused: No    Physically Abused: No    Sexually Abused: No    Past Surgical History:  Procedure Laterality Date   PARATHYROIDECTOMY     SKIN BIOPSY Left 03/02/2019   squamous cell carcinoma in situ, hypertrophic, traumatized   SKIN BIOPSY Right 08/01/2021   residual squamous cell caarcinoma    Family History  Problem Relation Age of Onset   Hypertension Father    Hyperlipidemia Father     Allergies  Allergen Reactions   Yellow Jacket Venom Anaphylaxis    Current Outpatient Medications on File Prior to Visit  Medication Sig Dispense Refill   Accu-Chek FastClix Lancets MISC 1 each by Does not apply route daily. 100 each 3   amLODipine (NORVASC) 10 MG tablet TAKE 1 TABLET BY MOUTH EVERY DAY 90 tablet 3   EPINEPHrine (EPI-PEN) 0.3 mg/0.3 mL DEVI Inject 0.3 mLs (0.3 mg total) into the muscle once. 1 Device 0   fish oil-omega-3 fatty acids 1000 MG capsule Take 2 g by mouth daily.     Glucosamine-Chondroit-Vit C-Mn (GLUCOSAMINE 1500 COMPLEX) CAPS Take 1 capsule by mouth daily.      glucose blood (ACCU-CHEK GUIDE) test strip Use one daily to check blood sugar 100 strip 12   Multiple Vitamins-Minerals (MULTIVITAMIN WITH MINERALS) tablet Take 1 tablet by mouth daily.     PRESCRIPTION MEDICATION CPAP- At bedtime     rosuvastatin (CRESTOR) 40 MG tablet TAKE 1 TABLET BY MOUTH EVERY DAY 90  tablet 3   tadalafil (CIALIS) 20 MG tablet Take 1 tablet (20 mg total) by mouth daily as needed for erectile dysfunction. 30 tablet 5   Testosterone 1.62 % GEL APPLY 3 PUMPS TO THE SHOULDER AND ARM DAILY 150 g 1   valsartan-hydrochlorothiazide (DIOVAN-HCT) 320-12.5 MG tablet TAKE 1 TABLET BY MOUTH EVERY DAY 90 tablet 3   Iron, Ferrous Sulfate, 325 (65 Fe) MG TABS Take 65 mg by mouth daily with breakfast. (Patient taking differently: Take 130 mg by mouth daily with breakfast.) 30 tablet 2   No current facility-administered medications on file prior to visit.    BP (!) 156/80   Pulse 65   Temp (!)  97.5 F (36.4 C) (Temporal)   Ht 5\' 11"  (1.803 m)   Wt 221 lb (100.2 kg)   SpO2 96%   BMI 30.82 kg/m  Objective:   Physical Exam Cardiovascular:     Rate and Rhythm: Normal rate and regular rhythm.  Pulmonary:     Effort: Pulmonary effort is normal.     Breath sounds: Normal breath sounds.  Musculoskeletal:     Cervical back: Neck supple.  Skin:    General: Skin is warm and dry.  Neurological:     Mental Status: He is alert and oriented to person, place, and time.  Psychiatric:        Mood and Affect: Mood normal.           Assessment & Plan:  Type 2 diabetes mellitus with hyperglycemia, without long-term current use of insulin [facility]) Assessment & Plan: Reviewed A1C from November 2024 from prior PCP which was 8.0.   Repeat A1C today as he feels that this was a lab error.  A1c today of 7.5.  We did discuss that this may not be entirely accurate given that his last A1c was checked 1 month ago. Regardless, I do believe that his A1c is improving given his weight loss and healthy lifestyle.  Repeat A1c in 3 months, follow-up in 6 months.   There is no evidence of diabetic nephropathy in this patient's chart. Will obtain urine microalbumin and renal function today.  Orders: -     POCT glycosylated hemoglobin (Hb A1C) -     Microalbumin / creatinine urine ratio -     Basic  metabolic panel  Sleep apnea, unspecified type Assessment & Plan: Stable.   Continue CPAP nightly.   Erectile dysfunction, unspecified erectile dysfunction type Assessment & Plan: Discussed to start with tadalafil 10 mg as needed. He will update if no improvement.   Hyperlipidemia, unspecified hyperlipidemia type Assessment & Plan: Stable.  Reviewed lipid panel from November 2024 per prior PCP. Continue rosuvastatin 40 mg daily.   Hypogonadism male Assessment & Plan: Controlled.  Continue testosterone 1.62% gel, 3 pumps daily. Reviewed testosterone and CBC from November 2024.   Essential hypertension Assessment & Plan: Above goal today, also on recheck.  Home readings are stable. Reviewed CMP from November 2024 per prior PCP.  Noted chronic hypokalemia which is likely from hydrochlorothiazide.  Repeat BMP pending today.  Consider elimination of hydrochlorothiazide.  Continue valsartan-hydrochlorothiazide 320-12.5 mg daily for now. Continue amlodipine 10 mg daily.  Continue to monitor blood pressure readings at home.         [name], NP

## 2023-03-12 NOTE — Assessment & Plan Note (Signed)
Controlled.  Continue testosterone 1.62% gel, 3 pumps daily. Reviewed testosterone and CBC from November 2024.

## 2023-03-12 NOTE — Telephone Encounter (Signed)
CRITICAL VALUE STICKER  CRITICAL VALUE:K 2.8  RECEIVER (on-site recipient of call):Edgel Degnan LPN  DATE & TIME NOTIFIED: 3:25 PM 03/12/23   MESSENGER (representative from lab): Saa  MD NOTIFIED: Mayra Reel, DPN AGNP-C  TIME OF NOTIFICATION:3:26 pm  RESPONSE:

## 2023-03-12 NOTE — Telephone Encounter (Signed)
See result note.  

## 2023-03-12 NOTE — Assessment & Plan Note (Signed)
Discussed to start with tadalafil 10 mg as needed. He will update if no improvement.

## 2023-03-12 NOTE — Assessment & Plan Note (Signed)
Above goal today, also on recheck.  Home readings are stable. Reviewed CMP from November 2024 per prior PCP.  Noted chronic hypokalemia which is likely from hydrochlorothiazide.  Repeat BMP pending today.  Consider elimination of hydrochlorothiazide.  Continue valsartan-hydrochlorothiazide 320-12.5 mg daily for now. Continue amlodipine 10 mg daily.  Continue to monitor blood pressure readings at home.

## 2023-03-12 NOTE — Assessment & Plan Note (Signed)
Stable.  Reviewed lipid panel from November 2024 per prior PCP. Continue rosuvastatin 40 mg daily.

## 2023-03-17 ENCOUNTER — Other Ambulatory Visit: Payer: Self-pay | Admitting: Primary Care

## 2023-03-30 ENCOUNTER — Other Ambulatory Visit: Payer: Medicare Other

## 2023-04-02 ENCOUNTER — Ambulatory Visit (INDEPENDENT_AMBULATORY_CARE_PROVIDER_SITE_OTHER): Payer: Medicare Other | Admitting: Primary Care

## 2023-04-02 ENCOUNTER — Encounter: Payer: Self-pay | Admitting: Primary Care

## 2023-04-02 VITALS — BP 152/80 | HR 70 | Temp 97.2°F | Ht 71.0 in | Wt 222.0 lb

## 2023-04-02 DIAGNOSIS — E876 Hypokalemia: Secondary | ICD-10-CM | POA: Diagnosis not present

## 2023-04-02 DIAGNOSIS — I10 Essential (primary) hypertension: Secondary | ICD-10-CM

## 2023-04-02 LAB — POTASSIUM: Potassium: 3.1 meq/L — ABNORMAL LOW (ref 3.5–5.1)

## 2023-04-02 NOTE — Progress Notes (Signed)
Subjective:    Patient ID: [name] [name], male    DOB: 1950/08/29, 73 y.o.   MRN: [id number]  HPI  [name] [name] is a very pleasant 73 y.o. male with a history of hypertension, sleep apnea, type 2 diabetes, hyperlipidemia who presents today for follow-up of hypertension.  He was last evaluated on 03/12/2023 as a new patient.  During his visit his blood pressure was noted to be above goal, home readings were at goal.  After review of records chronic hypokalemia was noted which was suspected to be secondary to hydrochlorothiazide.  Given this, we continued his valsartan-hydrochlorothiazide 320-12.5 mg daily and amlodipine 10 mg daily.    Labs were collected that day which later revealed hypokalemia with potassium of 2.8.  Because of this his hydrochlorothiazide was discontinued.  Valsartan 320 mg daily and amlodipine 10 mg daily were continued.  He was also treated for hypokalemia accordingly.  He is here today for blood pressure check and repeat potassium level.  Since his last visit he is compliant to valsartan 320 mg daily and amlodipine 10 mg daily. He is checking his HP at home which is running 150-160/70s. He completed his course of potassium chloride two weeks ago.   He denies chest pain, dizziness, headaches, blurred vision.   BP Readings from Last 3 Encounters:  04/02/23 (!) 152/80  03/12/23 (!) 156/80  02/04/23 120/70        Review of Systems  Eyes:  Negative for visual disturbance.  Respiratory:  Negative for shortness of breath.   Cardiovascular:  Negative for chest pain.  Neurological:  Negative for dizziness and headaches.         Past Medical History:  Diagnosis Date   Dyslipidemia    ED (erectile dysfunction)    Hyperlipidemia    Hyperparathyroidism    Hypertension    Hypogonadism, male    Sleep apnea     Social History   Socioeconomic History   Marital status: Married    Spouse name: Not on file   Number of children: Not on file   Years of  education: Not on file   Highest education level: Not on file  Occupational History   Not on file  Tobacco Use   Smoking status: Never   Smokeless tobacco: Never  Vaping Use   Vaping status: Never Used  Substance and Sexual Activity   Alcohol use: Yes    Alcohol/week: 14.0 standard drinks of alcohol    Types: 14 Cans of beer per week   Drug use: No   Sexual activity: Yes  Other Topics Concern   Not on file  Social History Narrative   Not on file   Social Drivers of Health   Financial Resource Strain: Low Risk  (03/25/2022)   Overall Financial Resource Strain (CARDIA)    Difficulty of Paying Living Expenses: Not hard at all  Food Insecurity: No Food Insecurity (08/13/2022)   Hunger Vital Sign    Worried About Running Out of Food in the Last Year: Never true    Ran Out of Food in the Last Year: Never true  Transportation Needs: No Transportation Needs (08/13/2022)   PRAPARE - Administrator, Civil Service (Medical): No    Lack of Transportation (Non-Medical): No  Physical Activity: Sufficiently Active (03/25/2022)   Exercise Vital Sign    Days of Exercise per Week: 4 days    Minutes of Exercise per Session: 50 min  Stress: No Stress Concern Present (03/25/2022)  [employer] of Occupational Health - Occupational Stress Questionnaire    Feeling of Stress : Not at all  Social Connections: Not on file  Intimate Partner Violence: Not At Risk (08/07/2022)   Humiliation, Afraid, Rape, and Kick questionnaire    Fear of Current or Ex-Partner: No    Emotionally Abused: No    Physically Abused: No    Sexually Abused: No    Past Surgical History:  Procedure Laterality Date   PARATHYROIDECTOMY     SKIN BIOPSY Left 03/02/2019   squamous cell carcinoma in situ, hypertrophic, traumatized   SKIN BIOPSY Right 08/01/2021   residual squamous cell caarcinoma    Family History  Problem Relation Age of Onset   Hypertension Father    Hyperlipidemia Father     Allergies   Allergen Reactions   Yellow Jacket Venom Anaphylaxis    Current Outpatient Medications on File Prior to Visit  Medication Sig Dispense Refill   Accu-Chek FastClix Lancets MISC 1 each by Does not apply route daily. 100 each 3   amLODipine (NORVASC) 10 MG tablet TAKE 1 TABLET BY MOUTH EVERY DAY 90 tablet 3   EPINEPHrine (EPI-PEN) 0.3 mg/0.3 mL DEVI Inject 0.3 mLs (0.3 mg total) into the muscle once. 1 Device 0   fish oil-omega-3 fatty acids 1000 MG capsule Take 2 g by mouth daily.     Glucosamine-Chondroit-Vit C-Mn (GLUCOSAMINE 1500 COMPLEX) CAPS Take 1 capsule by mouth daily.      glucose blood (ACCU-CHEK GUIDE) test strip Use one daily to check blood sugar 100 strip 12   Multiple Vitamins-Minerals (MULTIVITAMIN WITH MINERALS) tablet Take 1 tablet by mouth daily.     PRESCRIPTION MEDICATION CPAP- At bedtime     rosuvastatin (CRESTOR) 40 MG tablet TAKE 1 TABLET BY MOUTH EVERY DAY 90 tablet 3   tadalafil (CIALIS) 20 MG tablet Take 1 tablet (20 mg total) by mouth daily as needed for erectile dysfunction. 30 tablet 5   Testosterone 1.62 % GEL APPLY 3 PUMPS TO THE SHOULDER AND ARM DAILY 150 g 1   valsartan (DIOVAN) 320 MG tablet Take 1 tablet (320 mg total) by mouth daily. for blood pressure. 90 tablet 0   Iron, Ferrous Sulfate, 325 (65 Fe) MG TABS Take 65 mg by mouth daily with breakfast. (Patient taking differently: Take 130 mg by mouth daily with breakfast.) 30 tablet 2   potassium chloride SA (KLOR-CON M) 20 MEQ tablet Take 2 tablets (40 mEq total) by mouth 2 (two) times daily for 5 days. For low potassium. 20 tablet 0   No current facility-administered medications on file prior to visit.    BP (!) 152/80   Pulse 70   Temp (!) 97.2 F (36.2 C) (Temporal)   Ht 5\' 11"  (1.803 m)   Wt 222 lb (100.7 kg)   SpO2 97%   BMI 30.96 kg/m  Objective:   Physical Exam Cardiovascular:     Rate and Rhythm: Normal rate and regular rhythm.  Pulmonary:     Effort: Pulmonary effort is normal.      Breath sounds: Normal breath sounds.  Musculoskeletal:     Cervical back: Neck supple.  Skin:    General: Skin is warm and dry.  Neurological:     Mental Status: He is alert and oriented to person, place, and time.  Psychiatric:        Mood and Affect: Mood normal.           Assessment & Plan:  Hypokalemia -  Potassium  Essential hypertension Assessment & Plan: Above goal today, also upon recheck and with home readings.  Remain off of hydrochlorothiazide due to chronic hypokalemia. Continue valsartan 320 mg daily, amlodipine 10 mg daily.  Consider addition of carvedilol 6.25 mg twice daily.  Discussed with patient today.  Await potassium result.         [name], NP

## 2023-04-02 NOTE — Patient Instructions (Signed)
Stop by the lab prior to leaving today. I will notify you of your results once received.   Continue taking valsartan 320 mg daily and amlodipine 10 mg daily for blood pressure.  I will be in touch soon with next steps.  It was a pleasure to see you today!

## 2023-04-02 NOTE — Assessment & Plan Note (Signed)
Above goal today, also upon recheck and with home readings.  Remain off of hydrochlorothiazide due to chronic hypokalemia. Continue valsartan 320 mg daily, amlodipine 10 mg daily.  Consider addition of carvedilol 6.25 mg twice daily.  Discussed with patient today.  Await potassium result.

## 2023-04-03 ENCOUNTER — Other Ambulatory Visit: Payer: Self-pay | Admitting: Primary Care

## 2023-04-03 DIAGNOSIS — E876 Hypokalemia: Secondary | ICD-10-CM

## 2023-04-03 DIAGNOSIS — I10 Essential (primary) hypertension: Secondary | ICD-10-CM

## 2023-04-03 MED ORDER — POTASSIUM CHLORIDE CRYS ER 20 MEQ PO TBCR: 40.0000 meq | EXTENDED_RELEASE_TABLET | Freq: Two times a day (BID) | ORAL | 0 refills | Status: DC

## 2023-04-03 MED ORDER — CARVEDILOL 6.25 MG PO TABS
6.2500 mg | ORAL_TABLET | Freq: Two times a day (BID) | ORAL | 0 refills | Status: DC
Start: 2023-04-03 — End: 2023-04-17

## 2023-04-09 DIAGNOSIS — M47817 Spondylosis without myelopathy or radiculopathy, lumbosacral region: Secondary | ICD-10-CM | POA: Diagnosis not present

## 2023-04-09 DIAGNOSIS — M9903 Segmental and somatic dysfunction of lumbar region: Secondary | ICD-10-CM | POA: Diagnosis not present

## 2023-04-09 DIAGNOSIS — M9904 Segmental and somatic dysfunction of sacral region: Secondary | ICD-10-CM | POA: Diagnosis not present

## 2023-04-09 DIAGNOSIS — M9902 Segmental and somatic dysfunction of thoracic region: Secondary | ICD-10-CM | POA: Diagnosis not present

## 2023-04-09 DIAGNOSIS — M47894 Other spondylosis, thoracic region: Secondary | ICD-10-CM | POA: Diagnosis not present

## 2023-04-09 DIAGNOSIS — M47816 Spondylosis without myelopathy or radiculopathy, lumbar region: Secondary | ICD-10-CM | POA: Diagnosis not present

## 2023-04-16 ENCOUNTER — Emergency Department (HOSPITAL_COMMUNITY)
Admission: EM | Admit: 2023-04-16 | Discharge: 2023-04-17 | Disposition: A | Discharge status: Home / Self Care | Payer: Medicare Other

## 2023-04-17 ENCOUNTER — Ambulatory Visit: Payer: Medicare Other | Attending: Primary Care

## 2023-04-17 ENCOUNTER — Ambulatory Visit: Payer: Self-pay | Admitting: Primary Care

## 2023-04-17 ENCOUNTER — Ambulatory Visit (INDEPENDENT_AMBULATORY_CARE_PROVIDER_SITE_OTHER): Payer: Medicare Other | Admitting: Primary Care

## 2023-04-17 ENCOUNTER — Other Ambulatory Visit: Payer: Self-pay | Admitting: Primary Care

## 2023-04-17 ENCOUNTER — Encounter: Payer: Self-pay | Admitting: Primary Care

## 2023-04-17 VITALS — BP 150/86 | HR 60 | Temp 97.5°F | Ht 71.0 in | Wt 221.0 lb

## 2023-04-17 DIAGNOSIS — E876 Hypokalemia: Secondary | ICD-10-CM | POA: Insufficient documentation

## 2023-04-17 DIAGNOSIS — I4891 Unspecified atrial fibrillation: Secondary | ICD-10-CM | POA: Diagnosis not present

## 2023-04-17 DIAGNOSIS — I10 Essential (primary) hypertension: Secondary | ICD-10-CM

## 2023-04-17 DIAGNOSIS — I480 Paroxysmal atrial fibrillation: Secondary | ICD-10-CM | POA: Insufficient documentation

## 2023-04-17 MED ORDER — SPIRONOLACTONE 25 MG PO TABS: 25.0000 mg | ORAL_TABLET | Freq: Every day | ORAL | 0 refills | Status: DC

## 2023-04-17 MED ORDER — APIXABAN 5 MG PO TABS: 5.0000 mg | ORAL_TABLET | Freq: Two times a day (BID) | ORAL | 0 refills | Status: DC

## 2023-04-17 NOTE — Telephone Encounter (Signed)
Patient moved to [place] schedule.

## 2023-04-17 NOTE — Assessment & Plan Note (Addendum)
Above goal today, also with home readings. He is experiencing moderate bradycardia at home since initiation of carvedilol.  Discontinue carvedilol 6.25 mg twice daily. Continue valsartan 320 mg daily, amlodipine 10 mg daily. Given chronic hypokalemia, will add spironolactone 25 mg daily.  BMP pending today.  Follow up in 2 weeks.

## 2023-04-17 NOTE — Telephone Encounter (Signed)
Noted and appreciate Dr. Daphine Deutscher evaluation.  Will await office notes.

## 2023-04-17 NOTE — Telephone Encounter (Signed)
Will you switch him to my 3:40 pm slot from [facility]'s 3 pm slot.

## 2023-04-17 NOTE — Patient Instructions (Addendum)
Stop taking carvedilol for blood pressure.  Start taking spironolactone 25 mg once daily for blood pressure. Continue taking valsartan and amlodipine for blood pressure.  Start taking apixaban (Eliquis) 5 mg twice daily for stroke prevention due to atrial fibrillation.  Please update me regarding her blood pressure readings midweek next week.  Please schedule a follow up visit to meet back with me in 2-3 weeks for blood pressure check.   It was a pleasure to see you today!

## 2023-04-17 NOTE — ED Notes (Signed)
NA x 3, assumed elopement

## 2023-04-17 NOTE — Assessment & Plan Note (Addendum)
New diagnosis noted upon exam, confirmed with EKG today.  CHA2DS2-VASc score of 3 given age, history of diabetes, history of hypertension. Recent serum creatinine within normal range.  Discontinue carvedilol due to moderate bradycardia.  Will monitor heart rate. Working to gain control blood pressure.  Start Eliquis 5 mg twice daily for stroke prevention. Will also order ZIO Holter monitor to wear for 2 weeks.  Await lab results, follow-up in 2 weeks.

## 2023-04-17 NOTE — Telephone Encounter (Signed)
 Noted

## 2023-04-17 NOTE — Assessment & Plan Note (Signed)
Chronic history which is suspected to be secondary to thiazide diuretic. Repeat potassium level pending today.  Remain off of hydrochlorothiazide. See other notes regarding blood pressure treatment.

## 2023-04-17 NOTE — Telephone Encounter (Signed)
Chief Complaint: HTN Symptoms: BP 157/96 this AM Frequency: Went to ED yesterday for BP 160/111, states BP has been going up since he was taken off hydrochlorothiazide Pertinent Negatives: Patient denies headache, blurry vision, CP, heart palpitations, dizziness, weakness Disposition: [] ED /[] Urgent Care (no appt availability in office) / [x] Appointment(In office/virtual)/ []  [place] [facility]/ [] Home Care/ [] Refused Recommended Disposition /[] [facility]/ []  Follow-up with PCP Additional Notes: Pt reports increasingly high blood pressures since he was taken off hydrochlorothiazide. Pt is now taking carvedilol instead. Pt is also taking amlodipine and valsartan. Pt went to the ED last night for a BP of 160/111. Hr was asymptomatic at this time. Pt states at the ED his BP improved to 152/87 and he eloped after a long wait time. Pt denies blurry vision, headache, CP, heart palpitations, SOB, dizziness, weakness. Wife states pt sometimes looks pale, but that now his color is good. Pt states he has not yet taken his BP meds this AM. When pt looks pale to wife, pt states he has no symptoms at that time. Per protocol, pt scheduled in office today at 1500. Pt concerned recent medication changes have caused an increase in BP. RN advised pt to call 911 if his BP increases or if he develops symptoms like headache, blurry vision, SOB, CP, dizziness, weakness, etc. Wife and pt in agreement.    Copied from CRM [id number]. Topic: Clinical - Red Word Triage >> Apr 17, 2023  9:06 AM [name] [name] wrote: Red Word that prompted transfer to Nurse Triage: Patient went to the ER last night 1/30 due to high blood pressure it was 160/111.Marland Kitchen His reading was 157/96 as of this morning. Is on a new medication and worried that it keeps going up Reason for Disposition  Systolic BP  >= 160 OR Diastolic >= 100  Answer Assessment - Initial Assessment Questions 1. BLOOD PRESSURE: "What is the blood pressure?" "Did you  take at least two measurements 5 minutes apart?"     157/96 AM today. Last night BP was 160/111 and pt went to the ED. Repeat check at the ED was 157/96. Pt eloped from ED, took pressure at home and it was 152/87. Normally my BP is 120/80 "for the last 4 years", states it used to be under control. 2. ONSET: "When did you take your blood pressure?"     This AM 3. HOW: "How did you take your blood pressure?" (e.g., automatic home BP monitor, visiting nurse)     "It's the one you pump up" 4. HISTORY: "Do you have a history of high blood pressure?"     Yes, HTN 5. MEDICINES: "Are you taking any medicines for blood pressure?" "Have you missed any doses recently?"     Valsartan, coreg, amlodipine (pt was on Hydrochlorothiazide and was taken off because of a low K) 6. OTHER SYMPTOMS: "Do you have any symptoms?" (e.g., blurred vision, chest pain, difficulty breathing, headache, weakness)     No blurred vision, no headache. Wife states pt looks pale sometimes. "Now his color looks okay." Pt denies weakness, dizziness, passing out. Wife states she can tell his BP is high because he gets grumpy. No CP, no heart palpitations. No SOB.  Protocols used: Blood Pressure - High-A-AH

## 2023-04-17 NOTE — Progress Notes (Addendum)
Subjective:    Patient ID: [name] [name], male    DOB: 1951/01/03, 73 y.o.   MRN: [id number]  Hypertension Pertinent negatives include no chest pain or shortness of breath.    [name] [name] is a very pleasant 73 y.o. male with a history of hypertension, aortic atherosclerosis, sleep apnea, type 2 diabetes, hyperlipidemia who presents today for follow-up of hypertension.  Currently managed on carvedilol 6.25 mg twice daily, valsartan 320 mg daily, amlodipine 10 mg daily.  Hydrochlorothiazide 12.5 mg was discontinued due to chronic hypokalemia.  Chronic history of hypokalemia over the years ranging 2.8-3.4.  Potassium level on 03/12/2023 was 2.8, improved to 3.1 on 04/02/2023 after supplementation.   He is checking his BP at home which is running 150-160/80-90. His HR is mostly ranging 50-60s with a level of 46 being the lowest. He finished his potassium chloride pills on 04/08/23.   He denies headaches, dizziness, chest pain, blurred vision, palpitations.  BP Readings from Last 3 Encounters:  04/17/23 (!) 150/86  04/16/23 (!) 159/88  04/02/23 (!) 152/80     Review of Systems  Eyes:  Negative for visual disturbance.  Respiratory:  Negative for shortness of breath.   Cardiovascular:  Negative for chest pain.  Neurological:  Negative for dizziness.         Past Medical History:  Diagnosis Date   Dyslipidemia    ED (erectile dysfunction)    Hyperlipidemia    Hyperparathyroidism    Hypertension    Hypogonadism, male    Sleep apnea     Social History   Socioeconomic History   Marital status: Married    Spouse name: Not on file   Number of children: Not on file   Years of education: Not on file   Highest education level: Not on file  Occupational History   Not on file  Tobacco Use   Smoking status: Never   Smokeless tobacco: Never  Vaping Use   Vaping status: Never Used  Substance and Sexual Activity   Alcohol use: Yes    Alcohol/week: 14.0 standard drinks of  alcohol    Types: 14 Cans of beer per week   Drug use: No   Sexual activity: Yes  Other Topics Concern   Not on file  Social History Narrative   Not on file   Social Drivers of Health   Financial Resource Strain: Low Risk  (03/25/2022)   Overall Financial Resource Strain (CARDIA)    Difficulty of Paying Living Expenses: Not hard at all  Food Insecurity: No Food Insecurity (08/13/2022)   Hunger Vital Sign    Worried About Running Out of Food in the Last Year: Never true    Ran Out of Food in the Last Year: Never true  Transportation Needs: No Transportation Needs (08/13/2022)   PRAPARE - Administrator, Civil Service (Medical): No    Lack of Transportation (Non-Medical): No  Physical Activity: Sufficiently Active (03/25/2022)   Exercise Vital Sign    Days of Exercise per Week: 4 days    Minutes of Exercise per Session: 50 min  Stress: No Stress Concern Present (03/25/2022)   [employer] of Occupational Health - Occupational Stress Questionnaire    Feeling of Stress : Not at all  Social Connections: Not on file  Intimate Partner Violence: Not At Risk (08/07/2022)   Humiliation, Afraid, Rape, and Kick questionnaire    Fear of Current or Ex-Partner: No    Emotionally Abused: No  Physically Abused: No    Sexually Abused: No    Past Surgical History:  Procedure Laterality Date   PARATHYROIDECTOMY     SKIN BIOPSY Left 03/02/2019   squamous cell carcinoma in situ, hypertrophic, traumatized   SKIN BIOPSY Right 08/01/2021   residual squamous cell caarcinoma    Family History  Problem Relation Age of Onset   Hypertension Father    Hyperlipidemia Father     Allergies  Allergen Reactions   Yellow Jacket Venom Anaphylaxis    Current Outpatient Medications on File Prior to Visit  Medication Sig Dispense Refill   Accu-Chek FastClix Lancets MISC 1 each by Does not apply route daily. 100 each 3   amLODipine (NORVASC) 10 MG tablet TAKE 1 TABLET BY MOUTH EVERY DAY  90 tablet 3   EPINEPHrine (EPI-PEN) 0.3 mg/0.3 mL DEVI Inject 0.3 mLs (0.3 mg total) into the muscle once. 1 Device 0   fish oil-omega-3 fatty acids 1000 MG capsule Take 2 g by mouth daily.     Fluorouracil 4 % CREA Apply topically.     Glucosamine-Chondroit-Vit C-Mn (GLUCOSAMINE 1500 COMPLEX) CAPS Take 1 capsule by mouth daily.      glucose blood (ACCU-CHEK GUIDE) test strip Use one daily to check blood sugar 100 strip 12   Multiple Vitamins-Minerals (MULTIVITAMIN WITH MINERALS) tablet Take 1 tablet by mouth daily.     PRESCRIPTION MEDICATION CPAP- At bedtime     rosuvastatin (CRESTOR) 40 MG tablet TAKE 1 TABLET BY MOUTH EVERY DAY 90 tablet 3   tadalafil (CIALIS) 20 MG tablet Take 1 tablet (20 mg total) by mouth daily as needed for erectile dysfunction. 30 tablet 5   Testosterone 1.62 % GEL APPLY 3 PUMPS TO THE SHOULDER AND ARM DAILY 150 g 1   valsartan (DIOVAN) 320 MG tablet Take 1 tablet (320 mg total) by mouth daily. for blood pressure. 90 tablet 0   Iron, Ferrous Sulfate, 325 (65 Fe) MG TABS Take 65 mg by mouth daily with breakfast. (Patient taking differently: Take 130 mg by mouth daily with breakfast.) 30 tablet 2   No current facility-administered medications on file prior to visit.    BP (!) 150/86   Pulse 60   Temp (!) 97.5 F (36.4 C) (Temporal)   Ht 5\' 11"  (1.803 m)   Wt 221 lb (100.2 kg)   SpO2 96%   BMI 30.82 kg/m  Objective:   Physical Exam Cardiovascular:     Rate and Rhythm: Normal rate. Rhythm irregular.  Pulmonary:     Effort: Pulmonary effort is normal.     Breath sounds: Normal breath sounds.  Musculoskeletal:     Cervical back: Neck supple.  Skin:    General: Skin is warm and dry.  Neurological:     Mental Status: He is alert and oriented to person, place, and time.  Psychiatric:        Mood and Affect: Mood normal.           Assessment & Plan:  Essential hypertension Assessment & Plan: Above goal today, also with home readings. He is  experiencing moderate bradycardia at home since initiation of carvedilol.  Discontinue carvedilol 6.25 mg twice daily. Continue valsartan 320 mg daily, amlodipine 10 mg daily. Given chronic hypokalemia, will add spironolactone 25 mg daily.  BMP pending today.  Follow up in 2 weeks.   Orders: -     Spironolactone; Take 1 tablet (25 mg total) by mouth daily. for blood pressure.  Dispense: 30 tablet; Refill:  0 -     Basic metabolic panel -     EKG 12-Lead  Hypokalemia Assessment & Plan: Chronic history which is suspected to be secondary to thiazide diuretic. Repeat potassium level pending today.  Remain off of hydrochlorothiazide. See other notes regarding blood pressure treatment.  Orders: -     Basic metabolic panel  Atrial fibrillation, unspecified type [facility]) Assessment & Plan: New diagnosis noted upon exam, confirmed with EKG today.  CHA2DS2-VASc score of 3 given age, history of diabetes, history of hypertension. Recent serum creatinine within normal range.  Discontinue carvedilol due to moderate bradycardia.  Will monitor heart rate. Working to gain control blood pressure.  Start Eliquis 5 mg twice daily for stroke prevention. Will also order ZIO Holter monitor to wear for 2 weeks.  Await lab results, follow-up in 2 weeks.  Orders: -     Apixaban; Take 1 tablet (5 mg total) by mouth 2 (two) times daily.  Dispense: 180 tablet; Refill: 0 -     LONG TERM MONITOR (3-14 DAYS); Future        [name], NP

## 2023-04-17 NOTE — Addendum Note (Signed)
Addended by: [name] on: 04/17/2023 04:56 PM   Modules accepted: Orders

## 2023-04-18 LAB — BASIC METABOLIC PANEL
BUN: 17 mg/dL (ref 7–25)
CO2: 25 mmol/L (ref 20–32)
Calcium: 9.3 mg/dL (ref 8.6–10.3)
Chloride: 102 mmol/L (ref 98–110)
Creat: 1.16 mg/dL (ref 0.70–1.28)
Glucose, Bld: 229 mg/dL — ABNORMAL HIGH (ref 65–99)
Potassium: 3.4 mmol/L — ABNORMAL LOW (ref 3.5–5.3)
Sodium: 138 mmol/L (ref 135–146)

## 2023-04-19 NOTE — Telephone Encounter (Signed)
[name] [name], can you contact the pharmacy to learn about what happened with the Eliquis rx? He's saying it's $900. Can they tell if his insurance will cover Xarelto?

## 2023-04-20 NOTE — Telephone Encounter (Signed)
Called and spoke with patients pharmacy, they stated the price is $852 and that is likely due to him having to meet a deductible before the plan will begin to pay. Pharmacist ran Xarelto and it came back at the same price $854. He stated given this it is likely a deductible issue.

## 2023-04-22 ENCOUNTER — Other Ambulatory Visit: Payer: Medicare Other

## 2023-04-30 ENCOUNTER — Other Ambulatory Visit (INDEPENDENT_AMBULATORY_CARE_PROVIDER_SITE_OTHER): Payer: Medicare Other

## 2023-04-30 DIAGNOSIS — E876 Hypokalemia: Secondary | ICD-10-CM | POA: Diagnosis not present

## 2023-04-30 DIAGNOSIS — I10 Essential (primary) hypertension: Secondary | ICD-10-CM

## 2023-04-30 LAB — POTASSIUM: Potassium: 3.7 meq/L (ref 3.5–5.1)

## 2023-04-30 NOTE — Telephone Encounter (Signed)
[facility], can you put him on the lab schedule for this morning preferably?

## 2023-05-06 ENCOUNTER — Ambulatory Visit: Payer: Medicare Other | Admitting: Primary Care

## 2023-05-07 MED ORDER — SPIRONOLACTONE 50 MG PO TABS
50.0000 mg | ORAL_TABLET | Freq: Every day | ORAL | 0 refills | Status: DC
Start: 2023-05-07 — End: 2023-07-30

## 2023-05-12 DIAGNOSIS — M47816 Spondylosis without myelopathy or radiculopathy, lumbar region: Secondary | ICD-10-CM | POA: Diagnosis not present

## 2023-05-12 DIAGNOSIS — M47812 Spondylosis without myelopathy or radiculopathy, cervical region: Secondary | ICD-10-CM | POA: Diagnosis not present

## 2023-05-12 DIAGNOSIS — M5388 Other specified dorsopathies, sacral and sacrococcygeal region: Secondary | ICD-10-CM | POA: Diagnosis not present

## 2023-05-12 DIAGNOSIS — M9901 Segmental and somatic dysfunction of cervical region: Secondary | ICD-10-CM | POA: Diagnosis not present

## 2023-05-12 DIAGNOSIS — M9903 Segmental and somatic dysfunction of lumbar region: Secondary | ICD-10-CM | POA: Diagnosis not present

## 2023-05-12 DIAGNOSIS — M9904 Segmental and somatic dysfunction of sacral region: Secondary | ICD-10-CM | POA: Diagnosis not present

## 2023-05-13 ENCOUNTER — Ambulatory Visit (INDEPENDENT_AMBULATORY_CARE_PROVIDER_SITE_OTHER): Payer: Medicare Other | Admitting: Primary Care

## 2023-05-13 ENCOUNTER — Encounter: Payer: Self-pay | Admitting: *Deleted

## 2023-05-13 ENCOUNTER — Encounter: Payer: Self-pay | Admitting: Primary Care

## 2023-05-13 VITALS — BP 144/84 | HR 70 | Temp 97.9°F | Ht 71.0 in | Wt 220.0 lb

## 2023-05-13 DIAGNOSIS — I10 Essential (primary) hypertension: Secondary | ICD-10-CM

## 2023-05-13 DIAGNOSIS — R0609 Other forms of dyspnea: Secondary | ICD-10-CM | POA: Insufficient documentation

## 2023-05-13 DIAGNOSIS — I4891 Unspecified atrial fibrillation: Secondary | ICD-10-CM | POA: Diagnosis not present

## 2023-05-13 LAB — BASIC METABOLIC PANEL
BUN: 21 mg/dL (ref 6–23)
CO2: 29 meq/L (ref 19–32)
Calcium: 9.6 mg/dL (ref 8.4–10.5)
Chloride: 101 meq/L (ref 96–112)
Creatinine, Ser: 1.03 mg/dL (ref 0.40–1.50)
GFR: 72.47 mL/min (ref >60.00)
Glucose, Bld: 205 mg/dL — ABNORMAL HIGH (ref 70–99)
Potassium: 4.2 meq/L (ref 3.5–5.1)
Sodium: 138 meq/L (ref 135–145)

## 2023-05-13 NOTE — Assessment & Plan Note (Signed)
 Paroxysmal.  EKG today with sinus bradycardia, no bundle-branch block, PAC, PVC, acute ST changes. Different compared to EKG from January 2025.  We discussed the risks for not taking Eliquis.  He does have history of GI bleed.  Continue aspirin 81 mg daily, compression socks during upcoming flight, instructions to walk around the plane when able. Await Holter monitor results.  Echocardiogram ordered and pending.  Referral placed to cardiology.

## 2023-05-13 NOTE — Progress Notes (Signed)
 Subjective:    Patient ID: [name] [name], male    DOB: 01-15-1951, 73 y.o.   MRN: [id number]  HPI  [name] [name] is a very pleasant 73 y.o. male with a history of hypertension, new onset atrial fibrillation, sleep apnea, type 2 diabetes, hyperlipidemia, chronic hypokalemia who presents today for follow-up of hypertension.  Currently managed on spironolactone 50 mg daily, amlodipine 10 mg daily, valsartan 320 mg daily.  Previously managed on hydrochlorothiazide which was removed due to chronic hypokalemia.  Hypokalemia has normalized since removal of HCTZ.  During his last visit he was noted to have an irregular heart rhythm, EKG revealed atrial fibrillation without RVR.  Initiated on Eliquis 5 mg twice daily for CHA2DS2-VASc score of 4 and Holter monitor was ordered. He is here for follow-up today.  Since his last visit he has not been taking Eliquis as he is not convinced that he has atrial fibrillation and for fear of bleeding from prior GI bleeds (3 back to back episodes with one hospital stay, not NSAID induced).  He completed his Holter monitor, we are awaiting results. Initiated on carvedilol in January 2025 but this caused bradycardia.   He is checking his BP at home and is getting readings of 150-160/80s. He monitors his heart rate through his BP cuff at home, initially noticed an "irregular" reading, but over the last 1 week he has not noticed an irregular heart rate. He completed and sent in his Holter monitor two days ago.   He spread grass seed yesterday, walking with a spreader, did notice palpitations with shortness of breath and had to stop and rest for a few minutes twice. He has not seen cardiology. He has a family history of CAD with myocardial infarction in his father at age 23. He denies headaches, chest pain, diaphoresis.   BP Readings from Last 3 Encounters:  05/13/23 (!) 144/84  04/17/23 (!) 150/86  04/16/23 (!) 159/88     Review of Systems  Constitutional:  Negative  for diaphoresis.  Respiratory:  Positive for shortness of breath.   Cardiovascular:  Negative for chest pain.  Neurological:  Negative for dizziness and headaches.         Past Medical History:  Diagnosis Date   Dyslipidemia    ED (erectile dysfunction)    Hyperlipidemia    Hyperparathyroidism    Hypertension    Hypogonadism, male    Sleep apnea     Social History   Socioeconomic History   Marital status: Married    Spouse name: Not on file   Number of children: Not on file   Years of education: Not on file   Highest education level: Master's degree (e.g., MA, MS, MEng, MEd, MSW, MBA)  Occupational History   Not on file  Tobacco Use   Smoking status: Never   Smokeless tobacco: Never  Vaping Use   Vaping status: Never Used  Substance and Sexual Activity   Alcohol use: Yes    Alcohol/week: 14.0 standard drinks of alcohol    Types: 14 Cans of beer per week   Drug use: No   Sexual activity: Yes  Other Topics Concern   Not on file  Social History Narrative   Not on file   Social Drivers of Health   Financial Resource Strain: Low Risk  (05/12/2023)   Overall Financial Resource Strain (CARDIA)    Difficulty of Paying Living Expenses: Not very hard  Food Insecurity: No Food Insecurity (05/12/2023)   Hunger Vital  Sign    Worried About Programme researcher, broadcasting/film/video in the Last Year: Never true    Ran Out of Food in the Last Year: Never true  Transportation Needs: No Transportation Needs (05/12/2023)   PRAPARE - Administrator, Civil Service (Medical): No    Lack of Transportation (Non-Medical): No  Physical Activity: Insufficiently Active (05/12/2023)   Exercise Vital Sign    Days of Exercise per Week: 4 days    Minutes of Exercise per Session: 30 min  Stress: No Stress Concern Present (05/12/2023)   [employer] of Occupational Health - Occupational Stress Questionnaire    Feeling of Stress : Not at all  Social Connections: Moderately Integrated (05/12/2023)    Social Connection and Isolation Panel [NHANES]    Frequency of Communication with Friends and Family: More than three times a week    Frequency of Social Gatherings with Friends and Family: Twice a week    Attends Religious Services: More than 4 times per year    Active Member of [employer] or Organizations: No    Attends Engineer, structural: Not on file    Marital Status: Married  Catering manager Violence: Not At Risk (08/07/2022)   Humiliation, Afraid, Rape, and Kick questionnaire    Fear of Current or Ex-Partner: No    Emotionally Abused: No    Physically Abused: No    Sexually Abused: No    Past Surgical History:  Procedure Laterality Date   PARATHYROIDECTOMY     SKIN BIOPSY Left 03/02/2019   squamous cell carcinoma in situ, hypertrophic, traumatized   SKIN BIOPSY Right 08/01/2021   residual squamous cell caarcinoma    Family History  Problem Relation Age of Onset   Hypertension Father    Hyperlipidemia Father     Allergies  Allergen Reactions   Yellow Jacket Venom Anaphylaxis    Current Outpatient Medications on File Prior to Visit  Medication Sig Dispense Refill   Accu-Chek FastClix Lancets MISC 1 each by Does not apply route daily. 100 each 3   amLODipine (NORVASC) 10 MG tablet TAKE 1 TABLET BY MOUTH EVERY DAY 90 tablet 3   EPINEPHrine (EPI-PEN) 0.3 mg/0.3 mL DEVI Inject 0.3 mLs (0.3 mg total) into the muscle once. 1 Device 0   fish oil-omega-3 fatty acids 1000 MG capsule Take 2 g by mouth daily.     Glucosamine-Chondroit-Vit C-Mn (GLUCOSAMINE 1500 COMPLEX) CAPS Take 1 capsule by mouth daily.      glucose blood (ACCU-CHEK GUIDE) test strip Use one daily to check blood sugar 100 strip 12   Multiple Vitamins-Minerals (MULTIVITAMIN WITH MINERALS) tablet Take 1 tablet by mouth daily.     PRESCRIPTION MEDICATION CPAP- At bedtime     rosuvastatin (CRESTOR) 40 MG tablet TAKE 1 TABLET BY MOUTH EVERY DAY 90 tablet 3   spironolactone (ALDACTONE) 50 MG tablet Take 1  tablet (50 mg total) by mouth daily. for blood pressure. 90 tablet 0   tadalafil (CIALIS) 20 MG tablet Take 1 tablet (20 mg total) by mouth daily as needed for erectile dysfunction. 30 tablet 5   Testosterone 1.62 % GEL APPLY 3 PUMPS TO THE SHOULDER AND ARM DAILY 150 g 1   valsartan (DIOVAN) 320 MG tablet Take 1 tablet (320 mg total) by mouth daily. for blood pressure. 90 tablet 0   apixaban (ELIQUIS) 5 MG TABS tablet Take 1 tablet (5 mg total) by mouth 2 (two) times daily. (Patient not taking: Reported on 05/13/2023) 180 tablet 0  Fluorouracil 4 % CREA Apply topically. (Patient not taking: Reported on 05/13/2023)     Iron, Ferrous Sulfate, 325 (65 Fe) MG TABS Take 65 mg by mouth daily with breakfast. (Patient taking differently: Take 130 mg by mouth daily with breakfast.) 30 tablet 2   No current facility-administered medications on file prior to visit.    BP (!) 144/84   Pulse 70   Temp 97.9 F (36.6 C) (Temporal)   Ht 5\' 11"  (1.803 m)   Wt 220 lb (99.8 kg)   SpO2 97%   BMI 30.68 kg/m  Objective:   Physical Exam Cardiovascular:     Rate and Rhythm: Normal rate and regular rhythm.  Pulmonary:     Effort: Pulmonary effort is normal.     Breath sounds: Normal breath sounds.  Musculoskeletal:     Cervical back: Neck supple.  Skin:    General: Skin is warm and dry.  Neurological:     Mental Status: He is alert and oriented to person, place, and time.  Psychiatric:        Mood and Affect: Mood normal.           Assessment & Plan:  Atrial fibrillation, unspecified type [facility]) Assessment & Plan: Paroxysmal.  EKG today with sinus bradycardia, no bundle-branch block, PAC, PVC, acute ST changes. Different compared to EKG from January 2025.  We discussed the risks for not taking Eliquis.  He does have history of GI bleed.  Continue aspirin 81 mg daily, compression socks during upcoming flight, instructions to walk around the plane when able. Await Holter monitor  results.  Echocardiogram ordered and pending.  Referral placed to cardiology.  Orders: -     EKG 12-Lead -     Ambulatory referral to Cardiology -     ECHOCARDIOGRAM COMPLETE; Future  Exertional dyspnea Assessment & Plan: Symptoms suspicious for paroxysmal atrial fibrillation versus CAD.  Echocardiogram ordered and pending. Calcium coronary CT scan ordered and pending.  Referral placed to cardiology.  Continue aspirin 81 mg daily and statin therapy. EKG completed today.  Orders: -     Ambulatory referral to Cardiology -     CT CARDIAC SCORING (SELF PAY ONLY); Future -     ECHOCARDIOGRAM COMPLETE; Future  Essential hypertension Assessment & Plan: Improved but still above goal.  Looking for secondary causes. EKG today with sinus bradycardia without beta-blocker therapy.  Continue spironolactone 50 mg daily, amlodipine 10 mg daily, valsartan 320 mg daily. BMP pending.  Echocardiogram ordered and pending. CT coronary calcium score ordered and pending. Referral placed to cardiology.  Will also consult with cardiology today.  Orders: -     Basic metabolic panel -     Ambulatory referral to Cardiology        [name], NP

## 2023-05-13 NOTE — Assessment & Plan Note (Signed)
 Improved but still above goal.  Looking for secondary causes. EKG today with sinus bradycardia without beta-blocker therapy.  Continue spironolactone 50 mg daily, amlodipine 10 mg daily, valsartan 320 mg daily. BMP pending.  Echocardiogram ordered and pending. CT coronary calcium score ordered and pending. Referral placed to cardiology.  Will also consult with cardiology today.

## 2023-05-13 NOTE — Assessment & Plan Note (Addendum)
 Symptoms suspicious for paroxysmal atrial fibrillation versus CAD.  Echocardiogram ordered and pending. Calcium coronary CT scan ordered and pending.  Referral placed to cardiology.  Continue aspirin 81 mg daily and statin therapy. EKG completed today.

## 2023-05-13 NOTE — Patient Instructions (Signed)
 Stop by the lab prior to leaving today. I will notify you of your results once received.   You will receive a phone call regarding the echocardiogram and the CT scan of the heart.  You will either be contacted via phone regarding your referral to cardiology, or you may receive a letter on your MyChart portal from our referral team with instructions for scheduling an appointment. Please let us know if you have not been contacted by anyone within two weeks.  It was a pleasure to see you today!

## 2023-05-14 ENCOUNTER — Ambulatory Visit: Payer: Medicare Other | Attending: Cardiovascular Disease | Admitting: Cardiovascular Disease

## 2023-05-14 VITALS — BP 162/82 | HR 71 | Ht 71.0 in | Wt 218.6 lb

## 2023-05-14 DIAGNOSIS — I480 Paroxysmal atrial fibrillation: Secondary | ICD-10-CM | POA: Diagnosis not present

## 2023-05-14 MED ORDER — APIXABAN 5 MG PO TABS: 5.0000 mg | ORAL_TABLET | Freq: Two times a day (BID) | ORAL | 3 refills | Status: DC

## 2023-05-14 MED ORDER — APIXABAN 5 MG PO TABS: 5.0000 mg | ORAL_TABLET | Freq: Two times a day (BID) | ORAL | Status: DC

## 2023-05-14 NOTE — Patient Instructions (Signed)
 Medication Instructions:  START Eliquis 5mg  twice daily *If you need a refill on your cardiac medications before your next appointment, please call your pharmacy*  Follow-Up: At [facility], you and your health needs are our priority.  As part of our continuing mission to provide you with exceptional heart care, we have created designated Provider Care Teams.  These Care Teams include your primary Cardiologist (physician) and Advanced Practice Providers (APPs -  Physician Assistants and Nurse Practitioners) who all work together to provide you with the care you need, when you need it.  Your next appointment:   6 week(s)  Provider:   Earney Hamburg, MD

## 2023-05-14 NOTE — Progress Notes (Signed)
 Chief Complaint  Patient presents with   New Patient (Initial Visit)    Atrial fibrillation   History of Present Illness: 73 yo male with history of hyperlipidemia, HTN, DM-diet controlled, sleep apnea and new finding of atrial fibrillation who is here today as a new consult, referred by Vernona Rieger, NP for the evaluation of atrial fibrillation. He was seen in the primary care office 04/17/23 for routine follow up and EKG showed atrial fibrillation. He was asked to start Eliquis but he ultimately decided not to start this due to prior GI bleeding in 2024 due to diverticular bleeding. EKG on 05/13/23 in primary care with sinus bradycardia. He wore a cardiac monitor for two weeks in February 2025 and just mailed it in. It has not been available to read yet. He could tell that he went back into sinus last week based on his BP monitor/pulse signal. He tells me today that he feels well overall. He is anxious about starting Eliquis due to the prior GI bleeding. He has no chest pain or dyspnea.   Primary Care Physician: [name], NP   Past Medical History:  Diagnosis Date   Atrial fibrillation [facility])    Dyslipidemia    ED (erectile dysfunction)    Hyperlipidemia    Hyperparathyroidism    Hypertension    Hypogonadism, male    Sleep apnea     Past Surgical History:  Procedure Laterality Date   PARATHYROIDECTOMY     SKIN BIOPSY Left 03/02/2019   squamous cell carcinoma in situ, hypertrophic, traumatized   SKIN BIOPSY Right 08/01/2021   residual squamous cell caarcinoma    Current Outpatient Medications  Medication Sig Dispense Refill   Accu-Chek FastClix Lancets MISC 1 each by Does not apply route daily. 100 each 3   amLODipine (NORVASC) 10 MG tablet TAKE 1 TABLET BY MOUTH EVERY DAY 90 tablet 3   apixaban (ELIQUIS) 5 MG TABS tablet Take 1 tablet (5 mg total) by mouth 2 (two) times daily. 180 tablet 3   apixaban (ELIQUIS) 5 MG TABS tablet Take 1 tablet (5 mg total) by mouth 2  (two) times daily.     fish oil-omega-3 fatty acids 1000 MG capsule Take 2 g by mouth daily.     Glucosamine-Chondroit-Vit C-Mn (GLUCOSAMINE 1500 COMPLEX) CAPS Take 1 capsule by mouth daily.      glucose blood (ACCU-CHEK GUIDE) test strip Use one daily to check blood sugar 100 strip 12   Multiple Vitamins-Minerals (MULTIVITAMIN WITH MINERALS) tablet Take 1 tablet by mouth daily.     PRESCRIPTION MEDICATION CPAP- At bedtime     rosuvastatin (CRESTOR) 40 MG tablet TAKE 1 TABLET BY MOUTH EVERY DAY 90 tablet 3   spironolactone (ALDACTONE) 50 MG tablet Take 1 tablet (50 mg total) by mouth daily. for blood pressure. 90 tablet 0   tadalafil (CIALIS) 20 MG tablet Take 1 tablet (20 mg total) by mouth daily as needed for erectile dysfunction. 30 tablet 5   Testosterone 1.62 % GEL APPLY 3 PUMPS TO THE SHOULDER AND ARM DAILY 150 g 1   valsartan (DIOVAN) 320 MG tablet Take 1 tablet (320 mg total) by mouth daily. for blood pressure. 90 tablet 0   EPINEPHrine (EPI-PEN) 0.3 mg/0.3 mL DEVI Inject 0.3 mLs (0.3 mg total) into the muscle once. (Patient not taking: Reported on 05/14/2023) 1 Device 0   Iron, Ferrous Sulfate, 325 (65 Fe) MG TABS Take 65 mg by mouth daily with breakfast. (Patient taking differently: Take  130 mg by mouth daily with breakfast.) 30 tablet 2   No current facility-administered medications for this visit.    Allergies  Allergen Reactions   Yellow Jacket Venom Anaphylaxis    Social History   Socioeconomic History   Marital status: Married    Spouse name: Not on file   Number of children: 2   Years of education: Not on file   Highest education level: Master's degree (e.g., MA, MS, MEng, MEd, MSW, MBA)  Occupational History   Occupation: Retired from Consulting civil engineer with [employer]  Tobacco Use   Smoking status: Never   Smokeless tobacco: Never  Vaping Use   Vaping status: Never Used  Substance and Sexual Activity   Alcohol use: Yes    Alcohol/week: 14.0 standard drinks of alcohol    Types: 14  Cans of beer per week   Drug use: No   Sexual activity: Yes  Other Topics Concern   Not on file  Social History Narrative   Not on file   Social Drivers of Health   Financial Resource Strain: Low Risk  (05/12/2023)   Overall Financial Resource Strain (CARDIA)    Difficulty of Paying Living Expenses: Not very hard  Food Insecurity: No Food Insecurity (05/12/2023)   Hunger Vital Sign    Worried About Running Out of Food in the Last Year: Never true    Ran Out of Food in the Last Year: Never true  Transportation Needs: No Transportation Needs (05/12/2023)   PRAPARE - Administrator, Civil Service (Medical): No    Lack of Transportation (Non-Medical): No  Physical Activity: Insufficiently Active (05/12/2023)   Exercise Vital Sign    Days of Exercise per Week: 4 days    Minutes of Exercise per Session: 30 min  Stress: No Stress Concern Present (05/12/2023)   [employer] of Occupational Health - Occupational Stress Questionnaire    Feeling of Stress : Not at all  Social Connections: Moderately Integrated (05/12/2023)   Social Connection and Isolation Panel [NHANES]    Frequency of Communication with Friends and Family: More than three times a week    Frequency of Social Gatherings with Friends and Family: Twice a week    Attends Religious Services: More than 4 times per year    Active Member of [employer] or Organizations: No    Attends Engineer, structural: Not on file    Marital Status: Married  Catering manager Violence: Not At Risk (08/07/2022)   Humiliation, Afraid, Rape, and Kick questionnaire    Fear of Current or Ex-Partner: No    Emotionally Abused: No    Physically Abused: No    Sexually Abused: No    Family History  Problem Relation Age of Onset   Hypertension Father    Hyperlipidemia Father     Review of Systems:  As stated in the HPI and otherwise negative.   BP (!) 162/82   Pulse 71   Ht 5\' 11"  (1.803 m)   Wt 99.2 kg   SpO2 94%   BMI  30.49 kg/m   Physical Examination: General: Well developed, well nourished, NAD  HEENT: OP clear, mucus membranes moist  SKIN: warm, dry. No rashes. Neuro: No focal deficits  Musculoskeletal: Muscle strength 5/5 all ext  Psychiatric: Mood and affect normal  Neck: No JVD, no carotid bruits, no thyromegaly, no lymphadenopathy.  Lungs:Clear bilaterally, no wheezes, rhonci, crackles Cardiovascular: Regular rate and rhythm. No murmurs, gallops or rubs. Abdomen:Soft. Bowel sounds present. Non-tender.  Extremities: No lower extremity edema. Pulses are 2 + in the bilateral DP/PT.  EKG:  EKG is not ordered today. The ekg ordered today demonstrates  EKG from yesterday reviewed by me and shows sinus bradycardia.   Recent Labs: 08/09/2022: Magnesium 1.8 02/04/2023: ALT 25; Hemoglobin 15.0; Platelets 137 05/13/2023: BUN 21; Creatinine, Ser 1.03; Potassium 4.2; Sodium 138   Lipid Panel    Component Value Date/Time   CHOL 160 02/04/2023 1140   TRIG 134 02/04/2023 1140   HDL 54 02/04/2023 1140   CHOLHDL 3.0 02/04/2023 1140   CHOLHDL 4.1 06/24/2016 1126   VLDL 26 06/24/2016 1126   LDLCALC 83 02/04/2023 1140     Wt Readings from Last 3 Encounters:  05/14/23 99.2 kg  05/13/23 99.8 kg  04/17/23 100.2 kg    Assessment and Plan:   1. Atrial fibrillation, paroxysmal: He is in sinus today. CHADS VASC score 3 (age, HTN, DM). I have told him that I think he would benefit from anti-coagulation for stroke risk reduction. He is concerned about taking Eliquis due to prior GI bleeding. He is willing to start it for now. If he has GI bleeding, we will stop the Eliquis and consider Watchman. Echo pending. Coronary calcium score study pending per primary care.   Labs/ tests ordered today include:  No orders of the defined types were placed in this encounter.  Disposition:   F/U with me in 6-8 weeks  Signed, Verne Carrow, MD, [facility] 05/14/2023 3:56 PM    [facility]  [facility] [address] [place], [place], [place]  [address] Phone: [phone]; Fax: [phone]

## 2023-05-18 DIAGNOSIS — M47816 Spondylosis without myelopathy or radiculopathy, lumbar region: Secondary | ICD-10-CM | POA: Diagnosis not present

## 2023-05-18 DIAGNOSIS — M5388 Other specified dorsopathies, sacral and sacrococcygeal region: Secondary | ICD-10-CM | POA: Diagnosis not present

## 2023-05-18 DIAGNOSIS — M9904 Segmental and somatic dysfunction of sacral region: Secondary | ICD-10-CM | POA: Diagnosis not present

## 2023-05-18 DIAGNOSIS — M9903 Segmental and somatic dysfunction of lumbar region: Secondary | ICD-10-CM | POA: Diagnosis not present

## 2023-05-18 DIAGNOSIS — M47812 Spondylosis without myelopathy or radiculopathy, cervical region: Secondary | ICD-10-CM | POA: Diagnosis not present

## 2023-05-18 DIAGNOSIS — M9901 Segmental and somatic dysfunction of cervical region: Secondary | ICD-10-CM | POA: Diagnosis not present

## 2023-05-19 DIAGNOSIS — I4891 Unspecified atrial fibrillation: Secondary | ICD-10-CM | POA: Diagnosis not present

## 2023-05-25 ENCOUNTER — Other Ambulatory Visit: Payer: Self-pay | Admitting: Primary Care

## 2023-05-25 DIAGNOSIS — E1165 Type 2 diabetes mellitus with hyperglycemia: Secondary | ICD-10-CM

## 2023-05-27 DIAGNOSIS — I10 Essential (primary) hypertension: Secondary | ICD-10-CM

## 2023-05-27 DIAGNOSIS — I4891 Unspecified atrial fibrillation: Secondary | ICD-10-CM

## 2023-06-02 ENCOUNTER — Encounter: Payer: Self-pay | Admitting: *Deleted

## 2023-06-03 ENCOUNTER — Other Ambulatory Visit: Payer: Self-pay | Admitting: Primary Care

## 2023-06-03 DIAGNOSIS — I10 Essential (primary) hypertension: Secondary | ICD-10-CM

## 2023-06-03 MED ORDER — METOPROLOL SUCCINATE ER 25 MG PO TB24
25.0000 mg | ORAL_TABLET | Freq: Every day | ORAL | 0 refills | Status: DC
Start: 2023-06-03 — End: 2023-09-06

## 2023-06-08 ENCOUNTER — Ambulatory Visit
Admission: RE | Admit: 2023-06-08 | Discharge: 2023-06-08 | Disposition: A | Discharge status: Home / Self Care | Payer: Self-pay | Source: Ambulatory Visit | Attending: Primary Care | Admitting: Primary Care

## 2023-06-08 DIAGNOSIS — E1165 Type 2 diabetes mellitus with hyperglycemia: Secondary | ICD-10-CM

## 2023-06-08 DIAGNOSIS — R0609 Other forms of dyspnea: Secondary | ICD-10-CM | POA: Insufficient documentation

## 2023-06-08 DIAGNOSIS — E785 Hyperlipidemia, unspecified: Secondary | ICD-10-CM

## 2023-06-08 DIAGNOSIS — E291 Testicular hypofunction: Secondary | ICD-10-CM

## 2023-06-10 ENCOUNTER — Other Ambulatory Visit (INDEPENDENT_AMBULATORY_CARE_PROVIDER_SITE_OTHER)

## 2023-06-10 ENCOUNTER — Ambulatory Visit: Payer: Medicare Other | Admitting: Family Medicine

## 2023-06-10 ENCOUNTER — Other Ambulatory Visit: Payer: Medicare Other

## 2023-06-10 DIAGNOSIS — M9904 Segmental and somatic dysfunction of sacral region: Secondary | ICD-10-CM | POA: Diagnosis not present

## 2023-06-10 DIAGNOSIS — E1165 Type 2 diabetes mellitus with hyperglycemia: Secondary | ICD-10-CM

## 2023-06-10 DIAGNOSIS — M47812 Spondylosis without myelopathy or radiculopathy, cervical region: Secondary | ICD-10-CM | POA: Diagnosis not present

## 2023-06-10 DIAGNOSIS — E785 Hyperlipidemia, unspecified: Secondary | ICD-10-CM

## 2023-06-10 DIAGNOSIS — M9903 Segmental and somatic dysfunction of lumbar region: Secondary | ICD-10-CM | POA: Diagnosis not present

## 2023-06-10 DIAGNOSIS — M4726 Other spondylosis with radiculopathy, lumbar region: Secondary | ICD-10-CM | POA: Diagnosis not present

## 2023-06-10 DIAGNOSIS — M9901 Segmental and somatic dysfunction of cervical region: Secondary | ICD-10-CM | POA: Diagnosis not present

## 2023-06-10 DIAGNOSIS — M4727 Other spondylosis with radiculopathy, lumbosacral region: Secondary | ICD-10-CM | POA: Diagnosis not present

## 2023-06-10 LAB — LIPID PANEL
Cholesterol: 155 mg/dL (ref 0–200)
HDL: 53.1 mg/dL (ref >39.00)
LDL Cholesterol: 86 mg/dL (ref 0–99)
NonHDL: 102.33
Total CHOL/HDL Ratio: 3
Triglycerides: 83 mg/dL (ref 0.0–149.0)
VLDL: 16.6 mg/dL (ref 0.0–40.0)

## 2023-06-10 LAB — HEMOGLOBIN A1C: Hgb A1c MFr Bld: 9.4 % — ABNORMAL HIGH (ref 4.6–6.5)

## 2023-06-11 ENCOUNTER — Other Ambulatory Visit: Payer: Medicare Other

## 2023-06-11 DIAGNOSIS — E1165 Type 2 diabetes mellitus with hyperglycemia: Secondary | ICD-10-CM

## 2023-06-11 MED ORDER — EMPAGLIFLOZIN 10 MG PO TABS: 10.0000 mg | ORAL_TABLET | Freq: Every day | ORAL | 0 refills | Status: DC

## 2023-06-11 MED ORDER — TESTOSTERONE 1.62 % TD GEL: TRANSDERMAL | 0 refills | Status: DC

## 2023-06-12 ENCOUNTER — Other Ambulatory Visit (HOSPITAL_COMMUNITY): Payer: Self-pay

## 2023-06-12 DIAGNOSIS — M4726 Other spondylosis with radiculopathy, lumbar region: Secondary | ICD-10-CM | POA: Diagnosis not present

## 2023-06-12 DIAGNOSIS — M9901 Segmental and somatic dysfunction of cervical region: Secondary | ICD-10-CM | POA: Diagnosis not present

## 2023-06-12 DIAGNOSIS — M47812 Spondylosis without myelopathy or radiculopathy, cervical region: Secondary | ICD-10-CM | POA: Diagnosis not present

## 2023-06-12 DIAGNOSIS — M9904 Segmental and somatic dysfunction of sacral region: Secondary | ICD-10-CM | POA: Diagnosis not present

## 2023-06-12 DIAGNOSIS — M4727 Other spondylosis with radiculopathy, lumbosacral region: Secondary | ICD-10-CM | POA: Diagnosis not present

## 2023-06-12 DIAGNOSIS — M9903 Segmental and somatic dysfunction of lumbar region: Secondary | ICD-10-CM | POA: Diagnosis not present

## 2023-06-15 ENCOUNTER — Other Ambulatory Visit (INDEPENDENT_AMBULATORY_CARE_PROVIDER_SITE_OTHER): Payer: Self-pay | Admitting: Pharmacist

## 2023-06-15 DIAGNOSIS — M9903 Segmental and somatic dysfunction of lumbar region: Secondary | ICD-10-CM | POA: Diagnosis not present

## 2023-06-15 DIAGNOSIS — M9904 Segmental and somatic dysfunction of sacral region: Secondary | ICD-10-CM | POA: Diagnosis not present

## 2023-06-15 DIAGNOSIS — M9901 Segmental and somatic dysfunction of cervical region: Secondary | ICD-10-CM | POA: Diagnosis not present

## 2023-06-15 DIAGNOSIS — M47812 Spondylosis without myelopathy or radiculopathy, cervical region: Secondary | ICD-10-CM | POA: Diagnosis not present

## 2023-06-15 DIAGNOSIS — M4726 Other spondylosis with radiculopathy, lumbar region: Secondary | ICD-10-CM | POA: Diagnosis not present

## 2023-06-15 DIAGNOSIS — E1165 Type 2 diabetes mellitus with hyperglycemia: Secondary | ICD-10-CM

## 2023-06-15 DIAGNOSIS — M4727 Other spondylosis with radiculopathy, lumbosacral region: Secondary | ICD-10-CM | POA: Diagnosis not present

## 2023-06-15 NOTE — Telephone Encounter (Signed)
 [name] [name], will you see if patient qualifies for patient assistance for jardiance or farxiga for diabetes?

## 2023-06-15 NOTE — Patient Instructions (Addendum)
 [name],   It was a pleasure to speak with you today! As we discussed:?   YOUR INSURANCE PLAN [facility] PDP: Value Script [phone]) Your Summary of Benefits (copy/paste): https://contentserver.destinationrx.com/ContentServer/DRxProductContent/PDFs/[email or web address]_M.pdf Your Covered Drugs https://www.wellcare.com/[place]/members/prescription-drug-plans-2025/pdl-search/vs--25061-08  Plan Details:  $590 deductible on Brand medications (Tier 3-5) then coinsurance of 25% of the Brand medications cost. [name] [name] $134/month (after deductible) Xigduo (Farxiga+Metformin) [phone] (after deductible) Eliquis $135/month (after deductible)   Now, doing this math out knowing that Eliquis cost should decrease after your deductible is reached, you can do some math to figure what makes the most sense financially.  $590 deductible + $135/month means you actually may not hit your max out of pocket $2000 with Eliquis alone. Especially since you have a free sample + the 30 day free trial card.   You can still start [name] [name] and would likely hit your max $2000 with this and Eliquis or if this cost is too great, we can continue discussing cheaper alternatives like metformin (recall the trade-off is the benefits on the heart and kidneys which the cheaper/generic medicines do not have). You may also have a new Medicare plan next year that may offer cheaper brand medicines.  Lastly, there is 1 other brand medicine in the same class as Farxiga/Jardiance that is available for ~$50/month [name] [name]). It works in the same way so some expect the same kidney and heart benefits but this has not been studied/proven in the same way that Jardiance/Farxiga has.     MEDICARE GUIDANCE DHHS provides access to all Medicare patient to a Medicare specialist Engineer, production Information Program [facility]) Coordinator). This is a FREE and non-biased service for all [place] citizens. I recommend speaking with  them with a list of your medications as they can provide some insight into different Medicare plans based on your needs.  [facility] [employer] https://www.senior-resources-[place].org/seniors-health-insurance-information-program-SHIIP Local Senior [employer] Information Program [facility]) Coordinator [name] [name] Phone: [phone] (Ask for SHIIP help) Email: [email or web address] -[email or web address] Senior Resources [employer] Presenter, broadcasting Information Program [facility]) Coordinator [name] [name] [facility]     [place]  [address] [phone] (Ask for [facility] help)   Please reach out prior to your next scheduled appointment should you have any questions or concerns.  Thank you!   Future Appointments  Date Time Provider Department Center  06/25/2023 10:40 AM [name], MD [facility]  09/10/2023 11:00 AM [name], NP [facility]  02/05/2024  9:30 AM [facility] ANNUAL WELLNESS VISIT 1 [facility]  02/05/2024 10:20 AM [name], NP [facility]  03/23/2024 11:15 AM [name], MD [facility]    [name] [name], PharmD Clinical Pharmacist [facility] [phone]

## 2023-06-15 NOTE — Progress Notes (Unsigned)
   06/16/2023 Name: [name] [name] MRN: [id number] DOB: 03-17-1951  Subjective  Chief Complaint  Patient presents with   Diabetes   Medication Access   Care Team: Primary Care Provider: [name], NP  Reason for visit: ?  HO PARISI is a 73 y.o. male who presents today for a telephone visit with the pharmacist due to medication access concerns regarding their Jardiance.  ?   Medication Access: ?  Reports he saw somewhere at some point that brand medicines like Jardiance/Mounjaro are not covered by Medicare and is interested in switching to [place] as a cheaper alternative via cash price/GoodRx.   Prescription drug coverage: Sharilyn Sites PDP: 2025 Administrator PDP Retail Super ID (EXPRESS SCRIPTS)      Member WU:[id number] WGN:[id number] GRP:[id number] QMV:[id number] ValueScript [id number] PDP [id number]  $0 premium. $590 deductible on Brand medications then coinsurance of 25%.    Current Patient Assistance:  None  Patient lives in a household of 2 with an estimated combined annual income of >400% FPL    Medicare LIS Eligible: No . Income limit exceeded.   Assessment and Plan:   1. Medication Access Patient is not eligible for PAP due to income exceeding program limits. Not eligible for copay cards due to government insurance.  We discussed Medicare plan in great detail (deductible, co-insurance, formulary meds, catastrophic phase).  Reviewed SHIIP resources in the community. Recommend he set an appointment with [facility] to discuss all Medicare drug coverage options for future years.  Farxiga/Jardiance After a long discussion, patent's preference is to start [place] as he expects to reach his max OOP with Eliquis this year.  Recommend monitoring serum creatinine at baseline, then within two to four weeks after initiation or dose increase.  Discussed genitourinary fungal infection and urinary tract infections seen in some in clinical trials. Patient denies prior history. Maintain good  hygiene.  Reviewed sick day guidelines while taking an SGLTi  Reviewed importance of adequate hydration especially while A1c is high when physically active or outside in the sun.    Future Appointments  Date Time Provider Department Center  06/25/2023 10:40 AM [name], MD [facility]  09/10/2023 11:00 AM [name], NP [facility]  02/05/2024  9:30 AM [facility] ANNUAL WELLNESS VISIT 1 [facility] PEC  02/05/2024 10:20 AM [name], NP [facility]  03/23/2024 11:15 AM [name], MD [facility]   Loree Fee, PharmD Clinical Pharmacist [facility] [phone]

## 2023-06-16 MED ORDER — DAPAGLIFLOZIN PROPANEDIOL 10 MG PO TABS: 10.0000 mg | ORAL_TABLET | Freq: Every day | ORAL | 1 refills | Status: DC

## 2023-06-17 DIAGNOSIS — M4727 Other spondylosis with radiculopathy, lumbosacral region: Secondary | ICD-10-CM | POA: Diagnosis not present

## 2023-06-17 DIAGNOSIS — M9901 Segmental and somatic dysfunction of cervical region: Secondary | ICD-10-CM | POA: Diagnosis not present

## 2023-06-17 DIAGNOSIS — M9903 Segmental and somatic dysfunction of lumbar region: Secondary | ICD-10-CM | POA: Diagnosis not present

## 2023-06-17 DIAGNOSIS — M9904 Segmental and somatic dysfunction of sacral region: Secondary | ICD-10-CM | POA: Diagnosis not present

## 2023-06-17 DIAGNOSIS — M4726 Other spondylosis with radiculopathy, lumbar region: Secondary | ICD-10-CM | POA: Diagnosis not present

## 2023-06-17 DIAGNOSIS — M47812 Spondylosis without myelopathy or radiculopathy, cervical region: Secondary | ICD-10-CM | POA: Diagnosis not present

## 2023-06-18 ENCOUNTER — Other Ambulatory Visit (HOSPITAL_COMMUNITY)

## 2023-06-18 ENCOUNTER — Ambulatory Visit (HOSPITAL_COMMUNITY): Attending: Primary Care

## 2023-06-18 ENCOUNTER — Other Ambulatory Visit (HOSPITAL_COMMUNITY): Payer: Self-pay

## 2023-06-18 ENCOUNTER — Telehealth: Payer: Self-pay

## 2023-06-18 DIAGNOSIS — I4891 Unspecified atrial fibrillation: Secondary | ICD-10-CM | POA: Diagnosis not present

## 2023-06-18 DIAGNOSIS — R0609 Other forms of dyspnea: Secondary | ICD-10-CM | POA: Diagnosis not present

## 2023-06-18 LAB — ECHOCARDIOGRAM COMPLETE
Area-P 1/2: 4.4 cm2
S' Lateral: 3.8 cm

## 2023-06-18 NOTE — Telephone Encounter (Signed)
 Pharmacy Patient Advocate Encounter   Received notification from CoverMyMeds that prior authorization for Testosterone 20.25 MG/ACT(1.62%) gel is required/requested.   Insurance verification completed.   The patient is insured through [facility] .   Per test claim: PA required; PA submitted to above mentioned insurance via CoverMyMeds Key/confirmation #/EOC [id number] Status is pending

## 2023-06-19 DIAGNOSIS — M545 Low back pain, unspecified: Secondary | ICD-10-CM | POA: Diagnosis not present

## 2023-06-19 DIAGNOSIS — M791 Myalgia, unspecified site: Secondary | ICD-10-CM | POA: Diagnosis not present

## 2023-06-19 DIAGNOSIS — M25532 Pain in left wrist: Secondary | ICD-10-CM | POA: Diagnosis not present

## 2023-06-19 NOTE — Telephone Encounter (Signed)
 Pharmacy Patient Advocate Encounter  Received notification from [facility] that Prior Authorization for Testosterone 20.25 MG/ACT(1.62%) gel has been APPROVED from 06-19-2023 to 06-04-2023   PA #/Case ID/Reference #: [id number]

## 2023-06-25 ENCOUNTER — Encounter: Payer: Self-pay | Admitting: Cardiovascular Disease

## 2023-06-25 ENCOUNTER — Ambulatory Visit: Payer: Medicare Other | Attending: Cardiovascular Disease | Admitting: Cardiovascular Disease

## 2023-06-25 VITALS — BP 130/68 | HR 56 | Ht 71.0 in | Wt 219.0 lb

## 2023-06-25 DIAGNOSIS — I2510 Atherosclerotic heart disease of native coronary artery without angina pectoris: Secondary | ICD-10-CM | POA: Diagnosis not present

## 2023-06-25 DIAGNOSIS — M9901 Segmental and somatic dysfunction of cervical region: Secondary | ICD-10-CM | POA: Diagnosis not present

## 2023-06-25 DIAGNOSIS — M4727 Other spondylosis with radiculopathy, lumbosacral region: Secondary | ICD-10-CM | POA: Diagnosis not present

## 2023-06-25 DIAGNOSIS — M47812 Spondylosis without myelopathy or radiculopathy, cervical region: Secondary | ICD-10-CM | POA: Diagnosis not present

## 2023-06-25 DIAGNOSIS — I480 Paroxysmal atrial fibrillation: Secondary | ICD-10-CM | POA: Insufficient documentation

## 2023-06-25 DIAGNOSIS — M9903 Segmental and somatic dysfunction of lumbar region: Secondary | ICD-10-CM | POA: Diagnosis not present

## 2023-06-25 DIAGNOSIS — I3139 Other pericardial effusion (noninflammatory): Secondary | ICD-10-CM | POA: Insufficient documentation

## 2023-06-25 DIAGNOSIS — M4726 Other spondylosis with radiculopathy, lumbar region: Secondary | ICD-10-CM | POA: Diagnosis not present

## 2023-06-25 DIAGNOSIS — M9904 Segmental and somatic dysfunction of sacral region: Secondary | ICD-10-CM | POA: Diagnosis not present

## 2023-06-25 NOTE — Progress Notes (Signed)
 Chief Complaint  Patient presents with   Follow-up    CAD, pericardial effusion, atrial fib   History of Present Illness: 73 yo male with history of hyperlipidemia, HTN, DM-diet controlled, sleep apnea and recent finding of atrial fibrillation who is here today for follow up. He was seen in the primary care office 04/17/23 for routine follow up and EKG showed atrial fibrillation. He was asked to start Eliquis but he ultimately decided not to start this due to prior GI bleeding in 2024 due to diverticular bleeding. EKG on 05/13/23 in primary care with sinus bradycardia. When I met him in February 2025, he had just finished wearing his cardiac monitor and results were pending. We discussed stroke risk with atrial fib and decided to start Eliquis. Cardiac monitor March 2025 with Sinus, short runs of SVT, occasional PACs and PVCs. Coronary calcium scoring CT 06/12/23 with calcium score of 3150. Normal caliber aorta and evidence of pericardial effusion. Echo  06/18/23 with LVEF=55-60%, small pericardial effusion. Mild AI.   He is here today for follow up. The patient denies any chest pain, dyspnea, palpitations, lower extremity edema, orthopnea, PND, dizziness, near syncope or syncope.   Primary Care Physician: [name], NP   Past Medical History:  Diagnosis Date   Atrial fibrillation [facility])    Dyslipidemia    ED (erectile dysfunction)    Hyperlipidemia    Hyperparathyroidism    Hypertension    Hypogonadism, male    Sleep apnea     Past Surgical History:  Procedure Laterality Date   PARATHYROIDECTOMY     SKIN BIOPSY Left 03/02/2019   squamous cell carcinoma in situ, hypertrophic, traumatized   SKIN BIOPSY Right 08/01/2021   residual squamous cell caarcinoma    Current Outpatient Medications  Medication Sig Dispense Refill   Accu-Chek FastClix Lancets MISC 1 each by Does not apply route daily. 100 each 3   amLODipine (NORVASC) 10 MG tablet TAKE 1 TABLET BY MOUTH EVERY DAY 90  tablet 3   apixaban (ELIQUIS) 5 MG TABS tablet Take 1 tablet (5 mg total) by mouth 2 (two) times daily. 180 tablet 3   apixaban (ELIQUIS) 5 MG TABS tablet Take 1 tablet (5 mg total) by mouth 2 (two) times daily.     dapagliflozin propanediol (FARXIGA) 10 MG TABS tablet Take 1 tablet (10 mg total) by mouth daily before breakfast. for diabetes. 90 tablet 1   EPINEPHrine (EPI-PEN) 0.3 mg/0.3 mL DEVI Inject 0.3 mLs (0.3 mg total) into the muscle once. 1 Device 0   fish oil-omega-3 fatty acids 1000 MG capsule Take 2 g by mouth daily.     gabapentin (NEURONTIN) 300 MG capsule 300 mg 3 (three) times daily as needed.     Glucosamine-Chondroit-Vit C-Mn (GLUCOSAMINE 1500 COMPLEX) CAPS Take 1 capsule by mouth daily.      glucose blood (ACCU-CHEK GUIDE) test strip Use one daily to check blood sugar 100 strip 12   metoprolol succinate (TOPROL-XL) 25 MG 24 hr tablet Take 1 tablet (25 mg total) by mouth daily. for blood pressure. 90 tablet 0   Multiple Vitamins-Minerals (MULTIVITAMIN WITH MINERALS) tablet Take 1 tablet by mouth daily.     naproxen (NAPROSYN) 500 MG tablet Take 500 mg by mouth 2 (two) times daily with a meal.     PRESCRIPTION MEDICATION CPAP- At bedtime     rosuvastatin (CRESTOR) 40 MG tablet TAKE 1 TABLET BY MOUTH EVERY DAY 90 tablet 3   spironolactone (ALDACTONE) 50 MG tablet  Take 1 tablet (50 mg total) by mouth daily. for blood pressure. 90 tablet 0   tadalafil (CIALIS) 20 MG tablet Take 1 tablet (20 mg total) by mouth daily as needed for erectile dysfunction. 30 tablet 5   Testosterone 1.62 % GEL APPLY 2 PUMPS TO THE SHOULDER AND ARM DAILY 150 g 0   valsartan (DIOVAN) 320 MG tablet TAKE 1 TABLET (320 MG TOTAL) BY MOUTH DAILY. FOR BLOOD PRESSURE. 90 tablet 1   Iron, Ferrous Sulfate, 325 (65 Fe) MG TABS Take 65 mg by mouth daily with breakfast. (Patient taking differently: Take 130 mg by mouth daily with breakfast.) 30 tablet 2   No current facility-administered medications for this visit.     Allergies  Allergen Reactions   Yellow Jacket Venom Anaphylaxis    Social History   Socioeconomic History   Marital status: Married    Spouse name: Not on file   Number of children: 2   Years of education: Not on file   Highest education level: Master's degree (e.g., MA, MS, MEng, MEd, MSW, MBA)  Occupational History   Occupation: Retired from Consulting civil engineer with [employer]  Tobacco Use   Smoking status: Never   Smokeless tobacco: Never  Vaping Use   Vaping status: Never Used  Substance and Sexual Activity   Alcohol use: Yes    Alcohol/week: 14.0 standard drinks of alcohol    Types: 14 Cans of beer per week   Drug use: No   Sexual activity: Yes  Other Topics Concern   Not on file  Social History Narrative   Not on file   Social Drivers of Health   Financial Resource Strain: Low Risk  (05/12/2023)   Overall Financial Resource Strain (CARDIA)    Difficulty of Paying Living Expenses: Not very hard  Food Insecurity: No Food Insecurity (05/12/2023)   Hunger Vital Sign    Worried About Running Out of Food in the Last Year: Never true    Ran Out of Food in the Last Year: Never true  Transportation Needs: No Transportation Needs (05/12/2023)   PRAPARE - Administrator, Civil Service (Medical): No    Lack of Transportation (Non-Medical): No  Physical Activity: Insufficiently Active (05/12/2023)   Exercise Vital Sign    Days of Exercise per Week: 4 days    Minutes of Exercise per Session: 30 min  Stress: No Stress Concern Present (05/12/2023)   [employer] of Occupational Health - Occupational Stress Questionnaire    Feeling of Stress : Not at all  Social Connections: Moderately Integrated (05/12/2023)   Social Connection and Isolation Panel [NHANES]    Frequency of Communication with Friends and Family: More than three times a week    Frequency of Social Gatherings with Friends and Family: Twice a week    Attends Religious Services: More than 4 times per year    Active  Member of [employer] or Organizations: No    Attends Engineer, structural: Not on file    Marital Status: Married  Catering manager Violence: Not At Risk (08/07/2022)   Humiliation, Afraid, Rape, and Kick questionnaire    Fear of Current or Ex-Partner: No    Emotionally Abused: No    Physically Abused: No    Sexually Abused: No    Family History  Problem Relation Age of Onset   Hypertension Father    Hyperlipidemia Father     Review of Systems:  As stated in the HPI and otherwise negative.   BP  130/68   Pulse (!) 56   Ht 5\' 11"  (1.803 m)   Wt 99.3 kg   SpO2 96%   BMI 30.54 kg/m   Physical Examination: General: Well developed, well nourished, NAD  HEENT: OP clear, mucus membranes moist  SKIN: warm, dry. No rashes. Neuro: No focal deficits  Musculoskeletal: Muscle strength 5/5 all ext  Psychiatric: Mood and affect normal  Neck: No JVD, no carotid bruits, no thyromegaly, no lymphadenopathy.  Lungs:Clear bilaterally, no wheezes, rhonci, crackles Cardiovascular: Regular rate and rhythm. No murmurs, gallops or rubs. Abdomen:Soft. Bowel sounds present. Non-tender.  Extremities: No lower extremity edema. Pulses are 2 + in the bilateral DP/PT.  EKG:  EKG is not ordered today. The ekg ordered today demonstrates   Recent Labs: 08/09/2022: Magnesium 1.8 02/04/2023: ALT 25; Hemoglobin 15.0; Platelets 137 05/13/2023: BUN 21; Creatinine, Ser 1.03; Potassium 4.2; Sodium 138   Lipid Panel    Component Value Date/Time   CHOL 155 06/10/2023 1225   CHOL 160 02/04/2023 1140   TRIG 83.0 06/10/2023 1225   HDL 53.10 06/10/2023 1225   HDL 54 02/04/2023 1140   CHOLHDL 3 06/10/2023 1225   VLDL 16.6 06/10/2023 1225   LDLCALC 86 06/10/2023 1225   LDLCALC 83 02/04/2023 1140     Wt Readings from Last 3 Encounters:  06/25/23 99.3 kg  05/14/23 99.2 kg  05/13/23 99.8 kg    Assessment and Plan:   1. Atrial fibrillation, paroxysmal: He is in sinus today on exam. No irregularity on  his blood pressure cuff at home. CHADS VASC score 3 (age, HTN, DM). Will continue Toprol. He has not started Eliquis yet as he has been having back pain and taking Naprosyn. He will start Eliquis next week. If he has GI bleeding, we will stop the Eliquis and consider Watchman.   2. CAD without angina: He has evidence of coronary artery calcification on CT calcium study in March 2025. No chest pain. Will plan aggressive risk factor reduction. Continue statin and beta blocker. No ASA since he will start Eliquis soon.   3. SVT/PACs/PVCs: Continue beta blocker  4. Pericardial effusion: Small on echo 06/18/23. Will arrange limited echo in one month.   Labs/ tests ordered today include:   Orders Placed This Encounter  Procedures   ECHOCARDIOGRAM LIMITED   Disposition:   F/U with me in 6 months   Signed, Verne Carrow, MD, [facility] 06/25/2023 10:59 AM    [facility] [address] [place], [place], [place]  [address] Phone: [phone]; Fax: [phone]

## 2023-06-25 NOTE — Patient Instructions (Signed)
 Medication Instructions:  No changes *If you need a refill on your cardiac medications before your next appointment, please call your pharmacy*  Lab Work: none  Testing/Procedures: Limited Echo Your physician has requested that you have an echocardiogram. Echocardiography is a painless test that uses sound waves to create images of your heart. It provides your doctor with information about the size and shape of your heart and how well your heart's chambers and valves are working. This procedure takes approximately one hour. There are no restrictions for this procedure. Please do NOT wear cologne, perfume, aftershave, or lotions (deodorant is allowed). Please arrive 15 minutes prior to your appointment time.  Please note: We ask at that you not bring children with you during ultrasound (echo/ vascular) testing. Due to room size and safety concerns, children are not allowed in the ultrasound rooms during exams. Our front office staff cannot provide observation of children in our lobby area while testing is being conducted. An adult accompanying a patient to their appointment will only be allowed in the ultrasound room at the discretion of the ultrasound technician under special circumstances. We apologize for any inconvenience.   Follow-Up: At [facility], you and your health needs are our priority.  As part of our continuing mission to provide you with exceptional heart care, our providers are all part of one team.  This team includes your primary Cardiologist (physician) and Advanced Practice Providers or APPs (Physician Assistants and Nurse Practitioners) who all work together to provide you with the care you need, when you need it.  Your next appointment:   6 month(s)  Provider:   Verne Carrow, MD     1st Floor: - Lobby - Registration  - Pharmacy  - Lab - Cafe  2nd Floor: - PV Lab - Diagnostic Testing (echo, CT, nuclear med)  3rd Floor: - Vacant  4th  Floor: - TCTS (cardiothoracic surgery) - AFib Clinic - Structural Heart Clinic - Vascular Surgery  - Vascular Ultrasound  5th Floor: - HeartCare Cardiology (general and EP) - Clinical Pharmacy for coumadin, hypertension, lipid, weight-loss medications, and med management appointments    Valet parking services will be available as well.

## 2023-06-29 DIAGNOSIS — M47812 Spondylosis without myelopathy or radiculopathy, cervical region: Secondary | ICD-10-CM | POA: Diagnosis not present

## 2023-06-29 DIAGNOSIS — M9903 Segmental and somatic dysfunction of lumbar region: Secondary | ICD-10-CM | POA: Diagnosis not present

## 2023-06-29 DIAGNOSIS — M4727 Other spondylosis with radiculopathy, lumbosacral region: Secondary | ICD-10-CM | POA: Diagnosis not present

## 2023-06-29 DIAGNOSIS — M9901 Segmental and somatic dysfunction of cervical region: Secondary | ICD-10-CM | POA: Diagnosis not present

## 2023-06-29 DIAGNOSIS — M9904 Segmental and somatic dysfunction of sacral region: Secondary | ICD-10-CM | POA: Diagnosis not present

## 2023-06-29 DIAGNOSIS — M4726 Other spondylosis with radiculopathy, lumbar region: Secondary | ICD-10-CM | POA: Diagnosis not present

## 2023-07-01 DIAGNOSIS — M9901 Segmental and somatic dysfunction of cervical region: Secondary | ICD-10-CM | POA: Diagnosis not present

## 2023-07-01 DIAGNOSIS — M9903 Segmental and somatic dysfunction of lumbar region: Secondary | ICD-10-CM | POA: Diagnosis not present

## 2023-07-01 DIAGNOSIS — M4726 Other spondylosis with radiculopathy, lumbar region: Secondary | ICD-10-CM | POA: Diagnosis not present

## 2023-07-01 DIAGNOSIS — M47812 Spondylosis without myelopathy or radiculopathy, cervical region: Secondary | ICD-10-CM | POA: Diagnosis not present

## 2023-07-01 DIAGNOSIS — M4727 Other spondylosis with radiculopathy, lumbosacral region: Secondary | ICD-10-CM | POA: Diagnosis not present

## 2023-07-01 DIAGNOSIS — M9904 Segmental and somatic dysfunction of sacral region: Secondary | ICD-10-CM | POA: Diagnosis not present

## 2023-07-06 ENCOUNTER — Ambulatory Visit (HOSPITAL_COMMUNITY)

## 2023-07-06 DIAGNOSIS — M47812 Spondylosis without myelopathy or radiculopathy, cervical region: Secondary | ICD-10-CM | POA: Diagnosis not present

## 2023-07-06 DIAGNOSIS — M9901 Segmental and somatic dysfunction of cervical region: Secondary | ICD-10-CM | POA: Diagnosis not present

## 2023-07-06 DIAGNOSIS — M9903 Segmental and somatic dysfunction of lumbar region: Secondary | ICD-10-CM | POA: Diagnosis not present

## 2023-07-06 DIAGNOSIS — M9904 Segmental and somatic dysfunction of sacral region: Secondary | ICD-10-CM | POA: Diagnosis not present

## 2023-07-06 DIAGNOSIS — M4726 Other spondylosis with radiculopathy, lumbar region: Secondary | ICD-10-CM | POA: Diagnosis not present

## 2023-07-06 DIAGNOSIS — M4727 Other spondylosis with radiculopathy, lumbosacral region: Secondary | ICD-10-CM | POA: Diagnosis not present

## 2023-07-09 DIAGNOSIS — M5416 Radiculopathy, lumbar region: Secondary | ICD-10-CM | POA: Diagnosis not present

## 2023-07-14 DIAGNOSIS — Z08 Encounter for follow-up examination after completed treatment for malignant neoplasm: Secondary | ICD-10-CM | POA: Diagnosis not present

## 2023-07-14 DIAGNOSIS — L570 Actinic keratosis: Secondary | ICD-10-CM | POA: Diagnosis not present

## 2023-07-14 DIAGNOSIS — Z85828 Personal history of other malignant neoplasm of skin: Secondary | ICD-10-CM | POA: Diagnosis not present

## 2023-07-14 DIAGNOSIS — Z09 Encounter for follow-up examination after completed treatment for conditions other than malignant neoplasm: Secondary | ICD-10-CM | POA: Diagnosis not present

## 2023-07-14 DIAGNOSIS — L578 Other skin changes due to chronic exposure to nonionizing radiation: Secondary | ICD-10-CM | POA: Diagnosis not present

## 2023-07-14 DIAGNOSIS — L821 Other seborrheic keratosis: Secondary | ICD-10-CM | POA: Diagnosis not present

## 2023-07-14 DIAGNOSIS — Z85820 Personal history of malignant melanoma of skin: Secondary | ICD-10-CM | POA: Diagnosis not present

## 2023-07-14 DIAGNOSIS — D225 Melanocytic nevi of trunk: Secondary | ICD-10-CM | POA: Diagnosis not present

## 2023-07-14 DIAGNOSIS — Z872 Personal history of diseases of the skin and subcutaneous tissue: Secondary | ICD-10-CM | POA: Diagnosis not present

## 2023-07-14 DIAGNOSIS — L814 Other melanin hyperpigmentation: Secondary | ICD-10-CM | POA: Diagnosis not present

## 2023-07-21 DIAGNOSIS — M5416 Radiculopathy, lumbar region: Secondary | ICD-10-CM | POA: Diagnosis not present

## 2023-07-29 ENCOUNTER — Ambulatory Visit (HOSPITAL_COMMUNITY): Attending: Cardiology

## 2023-07-29 DIAGNOSIS — I3139 Other pericardial effusion (noninflammatory): Secondary | ICD-10-CM | POA: Diagnosis not present

## 2023-07-29 DIAGNOSIS — I5030 Unspecified diastolic (congestive) heart failure: Secondary | ICD-10-CM | POA: Diagnosis not present

## 2023-07-29 LAB — ECHOCARDIOGRAM LIMITED: Area-P 1/2: 2.54 cm2

## 2023-07-30 ENCOUNTER — Other Ambulatory Visit: Payer: Self-pay | Admitting: Primary Care

## 2023-07-30 ENCOUNTER — Ambulatory Visit: Payer: Self-pay | Admitting: Cardiovascular Disease

## 2023-07-30 DIAGNOSIS — I3139 Other pericardial effusion (noninflammatory): Secondary | ICD-10-CM

## 2023-07-30 DIAGNOSIS — I10 Essential (primary) hypertension: Secondary | ICD-10-CM

## 2023-08-06 DIAGNOSIS — M5416 Radiculopathy, lumbar region: Secondary | ICD-10-CM | POA: Diagnosis not present

## 2023-08-06 DIAGNOSIS — M25532 Pain in left wrist: Secondary | ICD-10-CM | POA: Diagnosis not present

## 2023-08-06 DIAGNOSIS — M791 Myalgia, unspecified site: Secondary | ICD-10-CM | POA: Diagnosis not present

## 2023-08-06 DIAGNOSIS — M5459 Other low back pain: Secondary | ICD-10-CM | POA: Diagnosis not present

## 2023-08-31 DIAGNOSIS — M545 Low back pain, unspecified: Secondary | ICD-10-CM | POA: Diagnosis not present

## 2023-09-05 ENCOUNTER — Other Ambulatory Visit: Payer: Self-pay | Admitting: Primary Care

## 2023-09-05 DIAGNOSIS — I10 Essential (primary) hypertension: Secondary | ICD-10-CM

## 2023-09-08 DIAGNOSIS — M545 Low back pain, unspecified: Secondary | ICD-10-CM | POA: Diagnosis not present

## 2023-09-09 ENCOUNTER — Other Ambulatory Visit: Payer: Self-pay | Admitting: Primary Care

## 2023-09-09 DIAGNOSIS — M47812 Spondylosis without myelopathy or radiculopathy, cervical region: Secondary | ICD-10-CM | POA: Diagnosis not present

## 2023-09-09 DIAGNOSIS — M9904 Segmental and somatic dysfunction of sacral region: Secondary | ICD-10-CM | POA: Diagnosis not present

## 2023-09-09 DIAGNOSIS — M4727 Other spondylosis with radiculopathy, lumbosacral region: Secondary | ICD-10-CM | POA: Diagnosis not present

## 2023-09-09 DIAGNOSIS — E291 Testicular hypofunction: Secondary | ICD-10-CM

## 2023-09-09 DIAGNOSIS — M9903 Segmental and somatic dysfunction of lumbar region: Secondary | ICD-10-CM | POA: Diagnosis not present

## 2023-09-09 DIAGNOSIS — M9901 Segmental and somatic dysfunction of cervical region: Secondary | ICD-10-CM | POA: Diagnosis not present

## 2023-09-09 DIAGNOSIS — M4726 Other spondylosis with radiculopathy, lumbar region: Secondary | ICD-10-CM | POA: Diagnosis not present

## 2023-09-10 ENCOUNTER — Ambulatory Visit: Payer: Medicare Other | Admitting: Primary Care

## 2023-09-15 ENCOUNTER — Ambulatory Visit (HOSPITAL_COMMUNITY)
Admission: RE | Admit: 2023-09-15 | Discharge: 2023-09-15 | Disposition: A | Discharge status: Home / Self Care | Source: Ambulatory Visit | Attending: Cardiology | Admitting: Cardiology

## 2023-09-15 DIAGNOSIS — I3139 Other pericardial effusion (noninflammatory): Secondary | ICD-10-CM

## 2023-09-16 DIAGNOSIS — M5416 Radiculopathy, lumbar region: Secondary | ICD-10-CM | POA: Diagnosis not present

## 2023-09-17 LAB — ECHOCARDIOGRAM LIMITED
Area-P 1/2: 3.11 cm2
P 1/2 time: 684 ms
S' Lateral: 3.3 cm

## 2023-09-19 ENCOUNTER — Ambulatory Visit: Payer: Self-pay | Admitting: Cardiovascular Disease

## 2023-09-22 ENCOUNTER — Other Ambulatory Visit: Payer: Self-pay | Admitting: Family Medicine

## 2023-09-22 DIAGNOSIS — E1169 Type 2 diabetes mellitus with other specified complication: Secondary | ICD-10-CM

## 2023-09-26 ENCOUNTER — Other Ambulatory Visit: Payer: Self-pay | Admitting: Family Medicine

## 2023-09-26 DIAGNOSIS — I152 Hypertension secondary to endocrine disorders: Secondary | ICD-10-CM

## 2023-10-29 ENCOUNTER — Ambulatory Visit: Admitting: Primary Care

## 2023-11-02 ENCOUNTER — Other Ambulatory Visit: Payer: Self-pay | Admitting: Primary Care

## 2023-11-02 DIAGNOSIS — I10 Essential (primary) hypertension: Secondary | ICD-10-CM

## 2023-11-11 ENCOUNTER — Other Ambulatory Visit: Payer: Self-pay | Admitting: Primary Care

## 2023-11-11 DIAGNOSIS — I10 Essential (primary) hypertension: Secondary | ICD-10-CM

## 2023-11-19 DIAGNOSIS — Z23 Encounter for immunization: Secondary | ICD-10-CM | POA: Diagnosis not present

## 2023-11-23 ENCOUNTER — Other Ambulatory Visit: Payer: Self-pay | Admitting: Primary Care

## 2023-11-23 DIAGNOSIS — Z23 Encounter for immunization: Secondary | ICD-10-CM

## 2023-11-23 MED ORDER — COVID-19 MRNA VACC (MODERNA) 50 MCG/0.5ML IM SUSP: 0.5000 mL | Freq: Once | INTRAMUSCULAR | 0 refills | Status: AC

## 2023-11-24 ENCOUNTER — Other Ambulatory Visit: Payer: Self-pay | Admitting: Primary Care

## 2023-11-24 DIAGNOSIS — Z23 Encounter for immunization: Secondary | ICD-10-CM

## 2023-11-24 MED ORDER — COVID-19 MRNA VACC (MODERNA) 50 MCG/0.5ML IM SUSP: 0.5000 mL | Freq: Once | INTRAMUSCULAR | 0 refills | Status: AC

## 2023-11-25 DIAGNOSIS — Z23 Encounter for immunization: Secondary | ICD-10-CM | POA: Diagnosis not present

## 2023-12-04 ENCOUNTER — Other Ambulatory Visit: Payer: Self-pay | Admitting: Primary Care

## 2023-12-04 DIAGNOSIS — E1165 Type 2 diabetes mellitus with hyperglycemia: Secondary | ICD-10-CM

## 2023-12-04 DIAGNOSIS — I10 Essential (primary) hypertension: Secondary | ICD-10-CM

## 2023-12-13 ENCOUNTER — Other Ambulatory Visit: Payer: Self-pay | Admitting: Primary Care

## 2023-12-13 DIAGNOSIS — I10 Essential (primary) hypertension: Secondary | ICD-10-CM

## 2023-12-13 NOTE — Telephone Encounter (Signed)
 Can we get him rescheduled for his annual physical, thanks!

## 2023-12-23 ENCOUNTER — Ambulatory Visit: Admitting: Primary Care

## 2023-12-24 ENCOUNTER — Encounter: Payer: Self-pay | Admitting: Cardiovascular Disease

## 2023-12-24 ENCOUNTER — Ambulatory Visit: Attending: Cardiovascular Disease | Admitting: Cardiovascular Disease

## 2023-12-24 VITALS — BP 136/66 | HR 63 | Ht 71.0 in | Wt 223.0 lb

## 2023-12-24 DIAGNOSIS — I480 Paroxysmal atrial fibrillation: Secondary | ICD-10-CM | POA: Diagnosis not present

## 2023-12-24 DIAGNOSIS — I2510 Atherosclerotic heart disease of native coronary artery without angina pectoris: Secondary | ICD-10-CM | POA: Insufficient documentation

## 2023-12-24 DIAGNOSIS — I3139 Other pericardial effusion (noninflammatory): Secondary | ICD-10-CM | POA: Insufficient documentation

## 2023-12-24 DIAGNOSIS — I471 Supraventricular tachycardia, unspecified: Secondary | ICD-10-CM | POA: Insufficient documentation

## 2023-12-24 NOTE — Progress Notes (Signed)
 Chief Complaint  Patient presents with   Follow-up    CAD, PAF   History of Present Illness: 73 yo male with history of CAD, hyperlipidemia, HTN, DM-diet controlled, sleep apnea, pericardial effusion and paroxysmal atrial fibrillation who is here today for follow up. He was seen in the primary care office 04/17/23 for routine follow up and EKG showed atrial fibrillation. WE recommended Eliquis  but he did not start. Cardiac monitor March 2025 with sinus, short runs of SVT, occasional PACs and PVCs. Coronary calcium  scoring CT 06/12/23 with calcium  score of 3150. Normal caliber aorta and evidence of pericardial effusion. Echo 06/18/23 with LVEF=55-60%, small pericardial effusion. Mild AI. Limited echo July 2025 with small pericardial effusion.   He is here today for follow up. He has had back trouble and has had injections. He only started Eliquis  this week. The patient denies any chest pain, dyspnea, palpitations, lower extremity edema, orthopnea, PND, dizziness, near syncope or syncope.   Primary Care Physician: [name], NP   Past Medical History:  Diagnosis Date   Atrial fibrillation [facility])    Dyslipidemia    ED (erectile dysfunction)    Hyperlipidemia    Hyperparathyroidism    Hypertension    Hypogonadism, male    Sleep apnea     Past Surgical History:  Procedure Laterality Date   PARATHYROIDECTOMY     SKIN BIOPSY Left 03/02/2019   squamous cell carcinoma in situ, hypertrophic, traumatized   SKIN BIOPSY Right 08/01/2021   residual squamous cell caarcinoma    Current Outpatient Medications  Medication Sig Dispense Refill   Accu-Chek FastClix Lancets MISC 1 each by Does not apply route daily. 100 each 3   amLODipine  (NORVASC ) 10 MG tablet Take 1 tablet (10 mg total) by mouth daily. for blood pressure. 90 tablet 1   apixaban  (ELIQUIS ) 5 MG TABS tablet Take 1 tablet (5 mg total) by mouth 2 (two) times daily.     dapagliflozin  propanediol (FARXIGA ) 10 MG TABS tablet TAKE  1 TABLET (10 MG TOTAL) BY MOUTH DAILY BEFORE BREAKFAST. FOR DIABETES. 90 tablet 0   EPINEPHrine  (EPI-PEN) 0.3 mg/0.3 mL DEVI Inject 0.3 mLs (0.3 mg total) into the muscle once. 1 Device 0   fish oil-omega-3 fatty acids 1000 MG capsule Take 2 g by mouth daily.     gabapentin (NEURONTIN) 300 MG capsule 300 mg 3 (three) times daily as needed.     Glucosamine-Chondroit-Vit C-Mn (GLUCOSAMINE 1500 COMPLEX) CAPS Take 1 capsule by mouth daily.      glucose blood (ACCU-CHEK GUIDE) test strip Use one daily to check blood sugar 100 strip 12   metoprolol  succinate (TOPROL -XL) 25 MG 24 hr tablet TAKE 1 TABLET BY MOUTH EVERY DAY FOR BLOOD PRESSURE 90 tablet 0   Multiple Vitamins-Minerals (MULTIVITAMIN WITH MINERALS) tablet Take 1 tablet by mouth daily.     naproxen (NAPROSYN) 500 MG tablet Take 500 mg by mouth 2 (two) times daily with a meal.     PRESCRIPTION MEDICATION CPAP- At bedtime     rosuvastatin  (CRESTOR ) 40 MG tablet TAKE 1 TABLET BY MOUTH EVERY DAY 90 tablet 3   spironolactone  (ALDACTONE ) 50 MG tablet TAKE 1 TABLET (50 MG TOTAL) BY MOUTH DAILY. FOR BLOOD PRESSURE. 90 tablet 0   tadalafil  (CIALIS ) 20 MG tablet Take 1 tablet (20 mg total) by mouth daily as needed for erectile dysfunction. 30 tablet 5   Testosterone  20.25 MG/ACT (1.62%) GEL APPLY 2 PUMPS TO THE SHOULDER AND ARM DAILY 150 g  0   valsartan  (DIOVAN ) 320 MG tablet TAKE 1 TABLET (320 MG TOTAL) BY MOUTH DAILY. FOR BLOOD PRESSURE. 90 tablet 0   apixaban  (ELIQUIS ) 5 MG TABS tablet Take 1 tablet (5 mg total) by mouth 2 (two) times daily. 180 tablet 3   Iron , Ferrous Sulfate , 325 (65 Fe) MG TABS Take 65 mg by mouth daily with breakfast. (Patient taking differently: Take 130 mg by mouth daily with breakfast.) 30 tablet 2   No current facility-administered medications for this visit.    Allergies  Allergen Reactions   Yellow Jacket Venom Anaphylaxis    Social History   Socioeconomic History   Marital status: Married    Spouse name: Not on  file   Number of children: 2   Years of education: Not on file   Highest education level: Master's degree (e.g., MA, MS, MEng, MEd, MSW, MBA)  Occupational History   Occupation: Retired from Consulting civil engineer with [employer]  Tobacco Use   Smoking status: Never   Smokeless tobacco: Never  Vaping Use   Vaping status: Never Used  Substance and Sexual Activity   Alcohol use: Yes    Alcohol/week: 14.0 standard drinks of alcohol    Types: 14 Cans of beer per week   Drug use: No   Sexual activity: Yes  Other Topics Concern   Not on file  Social History Narrative   Not on file   Social Drivers of Health   Financial Resource Strain: Low Risk  (05/12/2023)   Overall Financial Resource Strain (CARDIA)    Difficulty of Paying Living Expenses: Not very hard  Food Insecurity: No Food Insecurity (05/12/2023)   Hunger Vital Sign    Worried About Running Out of Food in the Last Year: Never true    Ran Out of Food in the Last Year: Never true  Transportation Needs: No Transportation Needs (05/12/2023)   PRAPARE - Administrator, Civil Service (Medical): No    Lack of Transportation (Non-Medical): No  Physical Activity: Insufficiently Active (05/12/2023)   Exercise Vital Sign    Days of Exercise per Week: 4 days    Minutes of Exercise per Session: 30 min  Stress: No Stress Concern Present (05/12/2023)   [employer] of Occupational Health - Occupational Stress Questionnaire    Feeling of Stress : Not at all  Social Connections: Moderately Integrated (05/12/2023)   Social Connection and Isolation Panel    Frequency of Communication with Friends and Family: More than three times a week    Frequency of Social Gatherings with Friends and Family: Twice a week    Attends Religious Services: More than 4 times per year    Active Member of [employer] or Organizations: No    Attends Engineer, structural: Not on file    Marital Status: Married  Catering manager Violence: Not At Risk (08/07/2022)    Humiliation, Afraid, Rape, and Kick questionnaire    Fear of Current or Ex-Partner: No    Emotionally Abused: No    Physically Abused: No    Sexually Abused: No    Family History  Problem Relation Age of Onset   Hypertension Father    Hyperlipidemia Father     Review of Systems:  As stated in the HPI and otherwise negative.   BP 136/66 (BP Location: Left Arm, Patient Position: Sitting, Cuff Size: Normal)   Pulse 63   Ht 5' 11 (1.803 m)   Wt 223 lb (101.2 kg)   SpO2 97%  BMI 31.10 kg/m   Physical Examination:  General: Well developed, well nourished, NAD  HEENT: OP clear, mucus membranes moist  SKIN: warm, dry. No rashes. Neuro: No focal deficits  Musculoskeletal: Muscle strength 5/5 all ext  Psychiatric: Mood and affect normal  Neck: No JVD, no carotid bruits, no thyromegaly, no lymphadenopathy.  Lungs:Clear bilaterally, no wheezes, rhonci, crackles Cardiovascular: Regular rate and rhythm. No murmurs, gallops or rubs. Abdomen:Soft. Bowel sounds present. Non-tender.  Extremities: No lower extremity edema. Pulses are 2 + in the bilateral DP/PT.  EKG:  EKG is not ordered today. The ekg ordered today demonstrates   Recent Labs: 02/04/2023: ALT 25; Hemoglobin 15.0; Platelets 137 05/13/2023: BUN 21; Creatinine, Ser 1.03; Potassium 4.2; Sodium 138   Lipid Panel    Component Value Date/Time   CHOL 155 06/10/2023 1225   CHOL 160 02/04/2023 1140   TRIG 83.0 06/10/2023 1225   HDL 53.10 06/10/2023 1225   HDL 54 02/04/2023 1140   CHOLHDL 3 06/10/2023 1225   VLDL 16.6 06/10/2023 1225   LDLCALC 86 06/10/2023 1225   LDLCALC 83 02/04/2023 1140     Wt Readings from Last 3 Encounters:  12/24/23 223 lb (101.2 kg)  06/25/23 219 lb (99.3 kg)  05/14/23 218 lb 9.6 oz (99.2 kg)    Assessment and Plan:   1. Atrial fibrillation, paroxysmal: Sinus today on exam. CHADS VASC score 3 (age, HTN, DM). Continue Toprol  and Eliquis .    2. CAD without angina: He has evidence of  coronary artery calcification on CT calcium  study in March 2025. No chest pain. Continue aggressive risk factor modification. Continue beta blocker and statin. No ASA since he is on Eliquis .   3. SVT/PACs/PVCs: Continue beta blocker  4. Pericardial effusion: Small on echo in July 2025, stable from prior echos.   Labs/ tests ordered today include:  No orders of the defined types were placed in this encounter.  Disposition:   F/U with me in 12 months   Signed, Lonni Cash, MD, [facility] 12/24/2023 11:14 AM    [facility] [address] [place], [place], [place]  [address] Phone: [phone]; Fax: [phone]

## 2023-12-24 NOTE — Patient Instructions (Signed)
 Medication Instructions:  Your physician recommends that you continue on your current medications as directed. Please refer to the Current Medication list given to you today.  *If you need a refill on your cardiac medications before your next appointment, please call your pharmacy*  Lab Work: None ordered  If you have labs (blood work) drawn today and your tests are completely normal, you will receive your results only by: MyChart Message (if you have MyChart) OR A paper copy in the mail If you have any lab test that is abnormal or we need to change your treatment, we will call you to review the results.  Testing/Procedures: None ordered  Follow-Up: At [facility], you and your health needs are our priority.  As part of our continuing mission to provide you with exceptional heart care, our providers are all part of one team.  This team includes your primary Cardiologist (physician) and Advanced Practice Providers or APPs (Physician Assistants and Nurse Practitioners) who all work together to provide you with the care you need, when you need it.  Your next appointment:   12 month(s)  Provider:   Antoinette Batman, MD    We recommend signing up for the patient portal called "MyChart".  Sign up information is provided on this After Visit Summary.  MyChart is used to connect with patients for Virtual Visits (Telemedicine).  Patients are able to view lab/test results, encounter notes, upcoming appointments, etc.  Non-urgent messages can be sent to your provider as well.   To learn more about what you can do with MyChart, go to [email or web address].   Other Instructions

## 2023-12-30 ENCOUNTER — Encounter: Payer: Self-pay | Admitting: Cardiovascular Disease

## 2024-01-14 DIAGNOSIS — D1801 Hemangioma of skin and subcutaneous tissue: Secondary | ICD-10-CM | POA: Diagnosis not present

## 2024-01-14 DIAGNOSIS — L570 Actinic keratosis: Secondary | ICD-10-CM | POA: Diagnosis not present

## 2024-01-14 DIAGNOSIS — L814 Other melanin hyperpigmentation: Secondary | ICD-10-CM | POA: Diagnosis not present

## 2024-01-14 DIAGNOSIS — L821 Other seborrheic keratosis: Secondary | ICD-10-CM | POA: Diagnosis not present

## 2024-01-14 DIAGNOSIS — Z08 Encounter for follow-up examination after completed treatment for malignant neoplasm: Secondary | ICD-10-CM | POA: Diagnosis not present

## 2024-01-14 DIAGNOSIS — Z85828 Personal history of other malignant neoplasm of skin: Secondary | ICD-10-CM | POA: Diagnosis not present

## 2024-01-22 ENCOUNTER — Other Ambulatory Visit: Payer: Self-pay | Admitting: Primary Care

## 2024-01-22 DIAGNOSIS — E291 Testicular hypofunction: Secondary | ICD-10-CM

## 2024-02-04 DIAGNOSIS — H524 Presbyopia: Secondary | ICD-10-CM | POA: Diagnosis not present

## 2024-02-04 DIAGNOSIS — E119 Type 2 diabetes mellitus without complications: Secondary | ICD-10-CM | POA: Diagnosis not present

## 2024-02-04 DIAGNOSIS — H2513 Age-related nuclear cataract, bilateral: Secondary | ICD-10-CM | POA: Diagnosis not present

## 2024-02-04 DIAGNOSIS — H5213 Myopia, bilateral: Secondary | ICD-10-CM | POA: Diagnosis not present

## 2024-02-04 LAB — OPHTHALMOLOGY REPORT-SCANNED

## 2024-02-05 ENCOUNTER — Ambulatory Visit: Payer: Medicare Other | Admitting: Primary Care

## 2024-02-05 ENCOUNTER — Ambulatory Visit: Payer: Medicare Other

## 2024-02-08 NOTE — Telephone Encounter (Signed)
 Unable to reach pt by phone but I did speak with pts wife (DPR signed)and Olam notified as  instructed and  pts wife voiced understanding and Olam will let pt know.[name] [name]

## 2024-02-08 NOTE — Telephone Encounter (Signed)
 Noted. I will see him tomorrow. Please ask him to hold eliquis  until appt tomorrow.  Agree with ER precautions - ie worsening dizziness, new shortness of breath, chest pain or ongoing black tarry stools or BRBPR.

## 2024-02-08 NOTE — Telephone Encounter (Signed)
 I spoke with pt; starting 02/04/24 pt  has lightheadedness on and off,loss of energy,and dark stools. Pt has not seen bright red blood in BM but 1 - 2 yrs ago pt had lower GI bleed, now no CP or SOB and no H/A or vision changes.pt has not had swelling in lower extremities. Pt is presently taking Eliquis . No available appts at [facility] but offered to schedule pt at a different LB office or UC and pt said he prefers to stay at [facility]. Pt scheduled appt with Dr Rilla on 02/09/24 at 12:30 with UC & ED precautions and pt voiced understanding. Pt said he has taken iron  pill for last couple of days but stool was dark prior to pt taking iron .sending note to Dr Rilla and Rilla pool.

## 2024-02-09 ENCOUNTER — Observation Stay (HOSPITAL_COMMUNITY)
Admission: EM | Admit: 2024-02-09 | Discharge: 2024-02-10 | Disposition: A | Discharge status: Home / Self Care | Source: Ambulatory Visit | Attending: Internal Medicine | Admitting: Internal Medicine

## 2024-02-09 ENCOUNTER — Ambulatory Visit: Admitting: Family Medicine

## 2024-02-09 ENCOUNTER — Emergency Department (HOSPITAL_COMMUNITY)

## 2024-02-09 ENCOUNTER — Other Ambulatory Visit: Payer: Self-pay

## 2024-02-09 ENCOUNTER — Encounter (HOSPITAL_COMMUNITY): Payer: Self-pay | Admitting: Internal Medicine

## 2024-02-09 VITALS — BP 110/50 | HR 68 | Temp 98.0°F | Ht 71.0 in | Wt 217.0 lb

## 2024-02-09 DIAGNOSIS — Z7984 Long term (current) use of oral hypoglycemic drugs: Secondary | ICD-10-CM | POA: Diagnosis not present

## 2024-02-09 DIAGNOSIS — I5032 Chronic diastolic (congestive) heart failure: Secondary | ICD-10-CM | POA: Insufficient documentation

## 2024-02-09 DIAGNOSIS — R970 Elevated carcinoembryonic antigen [CEA]: Secondary | ICD-10-CM | POA: Insufficient documentation

## 2024-02-09 DIAGNOSIS — K222 Esophageal obstruction: Secondary | ICD-10-CM | POA: Diagnosis not present

## 2024-02-09 DIAGNOSIS — I110 Hypertensive heart disease with heart failure: Secondary | ICD-10-CM | POA: Insufficient documentation

## 2024-02-09 DIAGNOSIS — K922 Gastrointestinal hemorrhage, unspecified: Secondary | ICD-10-CM | POA: Diagnosis not present

## 2024-02-09 DIAGNOSIS — K921 Melena: Secondary | ICD-10-CM | POA: Insufficient documentation

## 2024-02-09 DIAGNOSIS — Z79899 Other long term (current) drug therapy: Secondary | ICD-10-CM | POA: Insufficient documentation

## 2024-02-09 DIAGNOSIS — I2510 Atherosclerotic heart disease of native coronary artery without angina pectoris: Secondary | ICD-10-CM | POA: Diagnosis not present

## 2024-02-09 DIAGNOSIS — E1121 Type 2 diabetes mellitus with diabetic nephropathy: Secondary | ICD-10-CM | POA: Diagnosis not present

## 2024-02-09 DIAGNOSIS — G4730 Sleep apnea, unspecified: Secondary | ICD-10-CM | POA: Diagnosis present

## 2024-02-09 DIAGNOSIS — N179 Acute kidney failure, unspecified: Secondary | ICD-10-CM | POA: Diagnosis not present

## 2024-02-09 DIAGNOSIS — K449 Diaphragmatic hernia without obstruction or gangrene: Secondary | ICD-10-CM | POA: Diagnosis not present

## 2024-02-09 DIAGNOSIS — I10 Essential (primary) hypertension: Secondary | ICD-10-CM | POA: Diagnosis present

## 2024-02-09 DIAGNOSIS — D61818 Other pancytopenia: Secondary | ICD-10-CM | POA: Diagnosis present

## 2024-02-09 DIAGNOSIS — E785 Hyperlipidemia, unspecified: Secondary | ICD-10-CM | POA: Insufficient documentation

## 2024-02-09 DIAGNOSIS — I4891 Unspecified atrial fibrillation: Secondary | ICD-10-CM

## 2024-02-09 DIAGNOSIS — K2100 Gastro-esophageal reflux disease with esophagitis, without bleeding: Secondary | ICD-10-CM | POA: Diagnosis not present

## 2024-02-09 DIAGNOSIS — D649 Anemia, unspecified: Secondary | ICD-10-CM | POA: Diagnosis not present

## 2024-02-09 DIAGNOSIS — I70202 Unspecified atherosclerosis of native arteries of extremities, left leg: Secondary | ICD-10-CM | POA: Diagnosis not present

## 2024-02-09 DIAGNOSIS — I480 Paroxysmal atrial fibrillation: Secondary | ICD-10-CM | POA: Diagnosis present

## 2024-02-09 DIAGNOSIS — K3189 Other diseases of stomach and duodenum: Secondary | ICD-10-CM | POA: Diagnosis not present

## 2024-02-09 DIAGNOSIS — Z7901 Long term (current) use of anticoagulants: Secondary | ICD-10-CM | POA: Diagnosis not present

## 2024-02-09 DIAGNOSIS — K869 Disease of pancreas, unspecified: Secondary | ICD-10-CM | POA: Insufficient documentation

## 2024-02-09 DIAGNOSIS — K2950 Unspecified chronic gastritis without bleeding: Secondary | ICD-10-CM | POA: Diagnosis not present

## 2024-02-09 DIAGNOSIS — K429 Umbilical hernia without obstruction or gangrene: Secondary | ICD-10-CM | POA: Diagnosis not present

## 2024-02-09 DIAGNOSIS — E1169 Type 2 diabetes mellitus with other specified complication: Secondary | ICD-10-CM

## 2024-02-09 DIAGNOSIS — Q630 Accessory kidney: Secondary | ICD-10-CM | POA: Diagnosis not present

## 2024-02-09 DIAGNOSIS — K8689 Other specified diseases of pancreas: Secondary | ICD-10-CM | POA: Diagnosis not present

## 2024-02-09 DIAGNOSIS — E1165 Type 2 diabetes mellitus with hyperglycemia: Secondary | ICD-10-CM | POA: Diagnosis present

## 2024-02-09 DIAGNOSIS — G4733 Obstructive sleep apnea (adult) (pediatric): Secondary | ICD-10-CM | POA: Insufficient documentation

## 2024-02-09 DIAGNOSIS — K8020 Calculus of gallbladder without cholecystitis without obstruction: Secondary | ICD-10-CM | POA: Diagnosis not present

## 2024-02-09 HISTORY — DX: Melena: K92.1

## 2024-02-09 LAB — MAGNESIUM: Magnesium: 2.5 mg/dL — ABNORMAL HIGH (ref 1.7–2.4)

## 2024-02-09 LAB — COMPREHENSIVE METABOLIC PANEL WITH GFR
ALT: 22 U/L (ref 0–44)
AST: 28 U/L (ref 15–41)
Albumin: 4.4 g/dL (ref 3.5–5.0)
Alkaline Phosphatase: 47 U/L (ref 38–126)
Anion gap: 12 (ref 5–15)
BUN: 44 mg/dL — ABNORMAL HIGH (ref 8–23)
CO2: 23 mmol/L (ref 22–32)
Calcium: 10.1 mg/dL (ref 8.9–10.3)
Chloride: 101 mmol/L (ref 98–111)
Creatinine, Ser: 1.81 mg/dL — ABNORMAL HIGH (ref 0.61–1.24)
GFR, Estimated: 39 mL/min — ABNORMAL LOW (ref >60)
Glucose, Bld: 191 mg/dL — ABNORMAL HIGH (ref 70–99)
Potassium: 4.6 mmol/L (ref 3.5–5.1)
Sodium: 136 mmol/L (ref 135–145)
Total Bilirubin: 1.1 mg/dL (ref 0.0–1.2)
Total Protein: 7.1 g/dL (ref 6.5–8.1)

## 2024-02-09 LAB — RETICULOCYTES
Immature Retic Fract: 26.2 % — ABNORMAL HIGH (ref 2.3–15.9)
RBC.: 2.83 MIL/uL — ABNORMAL LOW (ref 4.22–5.81)
Retic Count, Absolute: 189 K/uL — ABNORMAL HIGH (ref 19.0–186.0)
Retic Ct Pct: 6.7 % — ABNORMAL HIGH (ref 0.4–3.1)

## 2024-02-09 LAB — CBC
HCT: 28.5 % — ABNORMAL LOW (ref 39.0–52.0)
HCT: 27.1 % — ABNORMAL LOW (ref 39.0–52.0)
Hemoglobin: 9.3 g/dL — ABNORMAL LOW (ref 13.0–17.0)
Hemoglobin: 8.5 g/dL — ABNORMAL LOW (ref 13.0–17.0)
MCH: 30.8 pg (ref 26.0–34.0)
MCH: 29.9 pg (ref 26.0–34.0)
MCHC: 32.6 g/dL (ref 30.0–36.0)
MCHC: 31.4 g/dL (ref 30.0–36.0)
MCV: 94.4 fL (ref 80.0–100.0)
MCV: 95.4 fL (ref 80.0–100.0)
Platelets: 125 K/uL — ABNORMAL LOW (ref 150–400)
Platelets: 104 K/uL — ABNORMAL LOW (ref 150–400)
RBC: 3.02 MIL/uL — ABNORMAL LOW (ref 4.22–5.81)
RBC: 2.84 MIL/uL — ABNORMAL LOW (ref 4.22–5.81)
RDW: 14.3 % (ref 11.5–15.5)
RDW: 14.4 % (ref 11.5–15.5)
WBC: 6.7 K/uL (ref 4.0–10.5)
WBC: 4.3 K/uL (ref 4.0–10.5)
nRBC: 0 % (ref 0.0–0.2)
nRBC: 0 % (ref 0.0–0.2)

## 2024-02-09 LAB — PROTIME-INR
INR: 1 (ref 0.8–1.2)
Prothrombin Time: 14.1 s (ref 11.4–15.2)

## 2024-02-09 LAB — IRON AND TIBC
Iron: 29 ug/dL — ABNORMAL LOW (ref 45–182)
Saturation Ratios: 7 % — ABNORMAL LOW (ref 17.9–39.5)
TIBC: 395 ug/dL (ref 250–450)
UIBC: 366 ug/dL

## 2024-02-09 LAB — FERRITIN: Ferritin: 57 ng/mL (ref 24–336)

## 2024-02-09 LAB — PHOSPHORUS: Phosphorus: 3.6 mg/dL (ref 2.5–4.6)

## 2024-02-09 LAB — CK: Total CK: 179 U/L (ref 49–397)

## 2024-02-09 LAB — GLUCOSE, CAPILLARY: Glucose-Capillary: 136 mg/dL — ABNORMAL HIGH (ref 70–99)

## 2024-02-09 LAB — TYPE AND SCREEN
ABO/RH(D): AB POS
Antibody Screen: NEGATIVE

## 2024-02-09 MED ORDER — HYDROCODONE-ACETAMINOPHEN 5-325 MG PO TABS: 1.0000 | ORAL_TABLET | ORAL | Status: DC | PRN

## 2024-02-09 MED ORDER — ACETAMINOPHEN 650 MG RE SUPP: 650.0000 mg | Freq: Four times a day (QID) | RECTAL | Status: DC | PRN

## 2024-02-09 MED ORDER — GABAPENTIN 300 MG PO CAPS: 300.0000 mg | ORAL_CAPSULE | Freq: Every day | ORAL | Status: DC

## 2024-02-09 MED ORDER — ONDANSETRON HCL 4 MG/2ML IJ SOLN: 4.0000 mg | Freq: Four times a day (QID) | INTRAMUSCULAR | Status: DC | PRN

## 2024-02-09 MED ORDER — LACTATED RINGERS IV BOLUS
1000.0000 mL | Freq: Once | INTRAVENOUS | Status: AC
  Administered 2024-02-09: 1000 mL via INTRAVENOUS

## 2024-02-09 MED ORDER — IOHEXOL 350 MG/ML SOLN
80.0000 mL | Freq: Once | INTRAVENOUS | Status: AC | PRN
  Administered 2024-02-09: 77 mL via INTRAVENOUS

## 2024-02-09 MED ORDER — PANTOPRAZOLE SODIUM 40 MG IV SOLR
40.0000 mg | Freq: Two times a day (BID) | INTRAVENOUS | Status: DC
  Administered 2024-02-10: 40 mg via INTRAVENOUS
  Filled 2024-02-09: qty 10

## 2024-02-09 MED ORDER — ONDANSETRON HCL 4 MG PO TABS
4.0000 mg | ORAL_TABLET | Freq: Four times a day (QID) | ORAL | Status: DC | PRN
Start: 2024-02-09 — End: 2024-02-10

## 2024-02-09 MED ORDER — ROSUVASTATIN CALCIUM 20 MG PO TABS
40.0000 mg | ORAL_TABLET | Freq: Every day | ORAL | Status: DC
  Administered 2024-02-09: 40 mg via ORAL
  Filled 2024-02-09 (×2): qty 2

## 2024-02-09 MED ORDER — INSULIN ASPART 100 UNIT/ML IJ SOLN
0.0000 [IU] | INTRAMUSCULAR | Status: DC
  Administered 2024-02-09 – 2024-02-10 (×2): 1 [IU] via SUBCUTANEOUS
  Administered 2024-02-10: 2 [IU] via SUBCUTANEOUS
  Administered 2024-02-10: 1 [IU] via SUBCUTANEOUS
  Filled 2024-02-09 (×2): qty 1
  Filled 2024-02-09: qty 2
  Filled 2024-02-09: qty 1

## 2024-02-09 MED ORDER — SODIUM CHLORIDE 0.9 % IV SOLN: INTRAVENOUS | Status: AC

## 2024-02-09 MED ORDER — ACETAMINOPHEN 325 MG PO TABS: 650.0000 mg | ORAL_TABLET | Freq: Four times a day (QID) | ORAL | Status: DC | PRN

## 2024-02-09 MED ORDER — FENTANYL CITRATE (PF) 50 MCG/ML IJ SOSY: 12.5000 ug | PREFILLED_SYRINGE | INTRAMUSCULAR | Status: DC | PRN

## 2024-02-09 MED ORDER — SODIUM CHLORIDE (PF) 0.9 % IJ SOLN
INTRAMUSCULAR | Status: AC
  Filled 2024-02-09: qty 50

## 2024-02-09 MED ORDER — PANTOPRAZOLE SODIUM 40 MG IV SOLR
80.0000 mg | Freq: Once | INTRAVENOUS | Status: AC
  Administered 2024-02-09: 80 mg via INTRAVENOUS
  Filled 2024-02-09: qty 20

## 2024-02-09 NOTE — Assessment & Plan Note (Signed)
 Appreciate [facility]

## 2024-02-09 NOTE — ED Triage Notes (Signed)
 Pt ambulatory to triage accompanied by wife. Pt was sent from his PCP with concern for possible GI bleed . Pt reports dark stools that began on Thursday, and a BP that is lower than normal. Pt states that he was orthostatic at office visit, and is dizzy when changing positions .

## 2024-02-09 NOTE — Progress Notes (Signed)
 Ph: [phone] Fax: [phone]   Patient ID: [name] [name], male    DOB: [birth year], 73 y.o.   MRN: [id number]  This visit was conducted in person.  BP (!) 110/50   Pulse 68   Temp 98 F (36.7 C) (Oral)   Ht 5' 11 (1.803 m)   Wt 217 lb (98.4 kg)   SpO2 99%   BMI 30.27 kg/m   BP Readings from Last 3 Encounters:  02/09/24 (!) 110/50  12/24/23 136/66  06/25/23 130/68    Orthostatic Vitals for the past 48 hrs (Last 6 readings):  Patient Position Orthostatic BP BP Pulse BP Location Cuff Size  02/09/24 1227 -- -- (!) 100/58 68 -- --  02/09/24 1234 -- -- (!) 110/50 -- -- --  02/09/24 1247 Supine 108/62 -- -- Left Arm Normal  02/09/24 1249 Standing (!) 70/60 -- -- Left Arm Normal  02/09/24 1252 Standing 90/60 -- -- Left Arm Normal    CC: lightheaded, dark stools  Subjective:   HPI: [name] [name] is a 72 y.o. male presenting on 02/09/2024 for Dizziness (Pt is here for Lightheadedness and dark stools )   He injured his back in March. Had ben regularly taking naprosyn until October 2025 at which time he stopped NSAID and just continued gabapentin .  Recent commencement of Eliquis  by cardiology 12/2023 for atrial fibrillation.  He has not been taking PPI.   5d ago developed black tarry stools and subsequently developed lightheadedness, but without chest pain, tightness, dyspnea, fevers/chills, nausea/vomiting.   Hospitalized for anemia and GI bleed 07/2022 s/p IV iron  infusion and blood transfusions.  EGD 07/2022 showed gastritis with biopsy showing chronic inactive gastritis and reflux gastritis, negative for intestinal metaplasia or EoE. Colonoscopy 08/2022 - fair prep, stool in entire colon, diverticulosis, 12mm polyp and 10mm polyps removed ([facility]). Pathology in media section dated 10/01/2022 with inflammatory polyp without dysplasia and tubular adenoma, recommendation at that time was repeat colonoscopy 3 yrs.  Discharged on pantoprazole  BID.      Relevant past medical,  surgical, family and social history reviewed and updated as indicated. Interim medical history since our last visit reviewed. Allergies and medications reviewed and updated. Outpatient Medications Prior to Visit  Medication Sig Dispense Refill   Accu-Chek FastClix Lancets MISC 1 each by Does not apply route daily. 100 each 3   amLODipine  (NORVASC ) 10 MG tablet Take 1 tablet (10 mg total) by mouth daily. for blood pressure. 90 tablet 1   dapagliflozin  propanediol (FARXIGA ) 10 MG TABS tablet TAKE 1 TABLET (10 MG TOTAL) BY MOUTH DAILY BEFORE BREAKFAST. FOR DIABETES. 90 tablet 0   EPINEPHrine  (EPI-PEN) 0.3 mg/0.3 mL DEVI Inject 0.3 mLs (0.3 mg total) into the muscle once. 1 Device 0   fish oil-omega-3 fatty acids 1000 MG capsule Take 2 g by mouth daily.     gabapentin  (NEURONTIN ) 300 MG capsule 300 mg 3 (three) times daily as needed.     Glucosamine-Chondroit-Vit C-Mn (GLUCOSAMINE 1500 COMPLEX) CAPS Take 1 capsule by mouth daily.      glucose blood (ACCU-CHEK GUIDE) test strip Use one daily to check blood sugar 100 strip 12   metoprolol  succinate (TOPROL -XL) 25 MG 24 hr tablet TAKE 1 TABLET BY MOUTH EVERY DAY FOR BLOOD PRESSURE 90 tablet 0   Multiple Vitamins-Minerals (MULTIVITAMIN WITH MINERALS) tablet Take 1 tablet by mouth daily.     PRESCRIPTION MEDICATION CPAP- At bedtime     rosuvastatin  (CRESTOR ) 40 MG tablet TAKE 1 TABLET BY MOUTH  EVERY DAY 90 tablet 3   spironolactone  (ALDACTONE ) 50 MG tablet TAKE 1 TABLET (50 MG TOTAL) BY MOUTH DAILY. FOR BLOOD PRESSURE. 90 tablet 0   tadalafil  (CIALIS ) 20 MG tablet Take 1 tablet (20 mg total) by mouth daily as needed for erectile dysfunction. 30 tablet 5   Testosterone  20.25 MG/ACT (1.62%) GEL APPLY 2 PUMPS TO THE SHOULDER AND ARM DAILY 150 g 0   valsartan  (DIOVAN ) 320 MG tablet TAKE 1 TABLET (320 MG TOTAL) BY MOUTH DAILY. FOR BLOOD PRESSURE. 90 tablet 0   apixaban  (ELIQUIS ) 5 MG TABS tablet Take 1 tablet (5 mg total) by mouth 2 (two) times daily.      apixaban  (ELIQUIS ) 5 MG TABS tablet Take 1 tablet (5 mg total) by mouth 2 (two) times daily. 180 tablet 3   Iron , Ferrous Sulfate , 325 (65 Fe) MG TABS Take 65 mg by mouth daily with breakfast. (Patient taking differently: Take 130 mg by mouth daily with breakfast.) 30 tablet 2   naproxen (NAPROSYN) 500 MG tablet Take 500 mg by mouth 2 (two) times daily with a meal. (Patient not taking: Reported on 02/09/2024)     No facility-administered medications prior to visit.     Per HPI unless specifically indicated in ROS section below Review of Systems  Objective:  BP (!) 110/50   Pulse 68   Temp 98 F (36.7 C) (Oral)   Ht 5' 11 (1.803 m)   Wt 217 lb (98.4 kg)   SpO2 99%   BMI 30.27 kg/m   Wt Readings from Last 3 Encounters:  02/09/24 217 lb (98.4 kg)  12/24/23 223 lb (101.2 kg)  06/25/23 219 lb (99.3 kg)      Physical Exam Vitals and nursing note reviewed.  Constitutional:      Appearance: Normal appearance. He is not ill-appearing.  HENT:     Head: Normocephalic and atraumatic.     Mouth/Throat:     Mouth: Mucous membranes are moist.     Pharynx: Oropharynx is clear. No oropharyngeal exudate or posterior oropharyngeal erythema.  Eyes:     Extraocular Movements: Extraocular movements intact.     Pupils: Pupils are equal, round, and reactive to light.     Comments: Conjunctival pallor present  Cardiovascular:     Rate and Rhythm: Normal rate and regular rhythm.     Pulses: Normal pulses.     Heart sounds: Normal heart sounds. No murmur heard. Pulmonary:     Effort: Pulmonary effort is normal. No respiratory distress.     Breath sounds: Normal breath sounds. No wheezing, rhonchi or rales.  Abdominal:     General: Bowel sounds are normal. There is no distension.     Palpations: Abdomen is soft. There is no mass.     Tenderness: There is no abdominal tenderness. There is no guarding or rebound.     Hernia: No hernia is present.  Musculoskeletal:     Right lower leg: No  edema.     Left lower leg: No edema.  Skin:    Coloration: Skin is pale.  Neurological:     Mental Status: He is alert.       Lab Results  Component Value Date   NA 138 05/13/2023   CL 101 05/13/2023   K 4.2 05/13/2023   CO2 29 05/13/2023   BUN 21 05/13/2023   CREATININE 1.03 05/13/2023   GFR 72.47 05/13/2023   CALCIUM  9.6 05/13/2023   PHOS 3.4 08/08/2022   ALBUMIN 4.5 02/04/2023  GLUCOSE 205 (H) 05/13/2023    Lab Results  Component Value Date   WBC 7.3 02/04/2023   HGB 15.0 02/04/2023   HCT 45.9 02/04/2023   MCV 87 02/04/2023   PLT 137 (L) 02/04/2023    Assessment & Plan:   Problem List Items Addressed This Visit     Atrial fibrillation (HCC)   Rec  hold eliquis  at this time.       Melena - Primary   Recent NSAID use for months followed by eliquis  commencement.  Now with several days of melena associated with orthostatic lightheadedness and malaise, orthostatics in office positive. Concern for upper GI bleed - recommend ER evaluation today for labwork, further evaluation, stabilization. He and wife agree with plan.        No orders of the defined types were placed in this encounter.   No orders of the defined types were placed in this encounter.   Patient Instructions  You blood pressure dropped significantly upon standing making me worried you've developed anemia from an upper gi bleed Go to ER today for further testing and treatment.  Continue to hold eliquis  for now   Follow up plan: Return if symptoms worsen or fail to improve.  Anton Blas, MD

## 2024-02-09 NOTE — Assessment & Plan Note (Signed)
 -  Order Sensitive  SSI     -  check TSH and HgA1C  - Hold by mouth medications*

## 2024-02-09 NOTE — Assessment & Plan Note (Signed)
Chronic stable avoid over aggressive fluid resuscitation 

## 2024-02-09 NOTE — Assessment & Plan Note (Signed)
 Rec  hold eliquis  at this time.

## 2024-02-09 NOTE — Assessment & Plan Note (Signed)
 Hold off on CPAP for now

## 2024-02-09 NOTE — Patient Instructions (Signed)
 You blood pressure dropped significantly upon standing making me worried you've developed anemia from an upper gi bleed Go to ER today for further testing and treatment.  Continue to hold eliquis  for now

## 2024-02-09 NOTE — ED Provider Notes (Signed)
 [name] AT [facility] Provider Note   CSN: [id number] Arrival date & time: 02/09/24  1409     Patient presents with: Melena   [name] [name] is a 73 y.o. male.  Is a 73 year old male with a history of A-fib on Eliquis , hypertension, hyperlipidemia, type 2 diabetes who presents to the ED from PCPs office for concern of GI bleed.  Patient notes he has had very dark stools for the past 5 days.  He has felt very fatigued and lightheaded as well.  He notes this is much worse when he gets up and moves.  He went to his PCP where he was told his blood pressure was low and he could have a GI bleed and was told to come to the ED for evaluation.  He notes he did stop his Eliquis  yesterday.  He has been on the eliquis  for the past month and a half.  Denies fevers, nausea/vomiting, hematemesis, abdominal pain.  No further complaints.   HPI     Prior to Admission medications   Medication Sig Start Date End Date Taking? Authorizing Provider  Accu-Chek FastClix Lancets MISC 1 each by Does not apply route daily. 10/16/20   Joyce Norleen BROCKS, MD  amLODipine  (NORVASC ) 10 MG tablet Take 1 tablet (10 mg total) by mouth daily. for blood pressure. 09/28/23   Clark, Katherine K, NP  apixaban  (ELIQUIS ) 5 MG TABS tablet Take 1 tablet (5 mg total) by mouth 2 (two) times daily. 05/14/23   [name], MD  dapagliflozin  propanediol (FARXIGA ) 10 MG TABS tablet TAKE 1 TABLET (10 MG TOTAL) BY MOUTH DAILY BEFORE BREAKFAST. FOR DIABETES. 12/04/23   [name], NP  EPINEPHrine  (EPI-PEN) 0.3 mg/0.3 mL DEVI Inject 0.3 mLs (0.3 mg total) into the muscle once. Patient taking differently: Inject 0.3 mg into the muscle once as needed (for anaphylaxis). 03/14/14   Joyce Norleen BROCKS, MD  fish oil-omega-3 fatty acids 1000 MG capsule Take 2 g by mouth daily.    [provider]  gabapentin  (NEURONTIN ) 300 MG capsule 300 mg 3 (three) times daily as needed. 06/19/23   [provider]  Glucosamine-Chondroit-Vit C-Mn (GLUCOSAMINE 1500 COMPLEX) CAPS Take 1 capsule by mouth daily.     [provider]  glucose blood (ACCU-CHEK GUIDE) test strip Use one daily to check blood sugar 10/16/20   Lalonde, John C, MD  Iron , Ferrous Sulfate , 325 (65 Fe) MG TABS Take 65 mg by mouth daily with breakfast. Patient not taking: Reported on 02/09/2024 08/09/22 02/09/24  [place], Camellia PARAS, DO  metoprolol  succinate (TOPROL -XL) 25 MG 24 hr tablet TAKE 1 TABLET BY MOUTH EVERY DAY FOR BLOOD PRESSURE 12/13/23   Clark, Katherine K, NP  Multiple Vitamins-Minerals (MULTIVITAMIN WITH MINERALS) tablet Take 1 tablet by mouth daily.    [provider]  PRESCRIPTION MEDICATION CPAP- At bedtime    [provider]  rosuvastatin  (CRESTOR ) 40 MG tablet TAKE 1 TABLET BY MOUTH EVERY DAY 09/22/23   Joyce Norleen BROCKS, MD  spironolactone  (ALDACTONE ) 50 MG tablet TAKE 1 TABLET (50 MG TOTAL) BY MOUTH DAILY. FOR BLOOD PRESSURE. 11/02/23   [name], NP  tadalafil  (CIALIS ) 20 MG tablet Take 1 tablet (20 mg total) by mouth daily as needed for erectile dysfunction. 02/04/23   Joyce Norleen BROCKS, MD  Testosterone  20.25 MG/ACT (1.62%) GEL APPLY 2 PUMPS TO THE SHOULDER AND ARM DAILY 01/22/24   Clark, Katherine K, NP  valsartan  (DIOVAN ) 320 MG tablet  TAKE 1 TABLET (320 MG TOTAL) BY MOUTH DAILY. FOR BLOOD PRESSURE. 12/04/23 12/03/24  [name], NP    Allergies: Yellow jacket venom    Review of Systems  Constitutional:  Negative for chills and fever.  Respiratory:  Negative for shortness of breath.   Cardiovascular:  Negative for chest pain.  Gastrointestinal:  Positive for anal bleeding. Negative for abdominal pain, diarrhea, nausea and vomiting.  Neurological:  Negative for headaches.    Updated Vital Signs BP (!) 115/56 (BP Location: Left Arm)   Pulse 60   Temp 97.9 F (36.6 C) (Oral)   Resp 16   SpO2 98%   Physical Exam Constitutional:      Appearance: Normal  appearance.  HENT:     Head: Normocephalic and atraumatic.     Nose: Nose normal.     Mouth/Throat:     Mouth: Mucous membranes are moist.     Pharynx: Oropharynx is clear.  Cardiovascular:     Rate and Rhythm: Normal rate.  Pulmonary:     Effort: Pulmonary effort is normal.  Abdominal:     General: Bowel sounds are normal.     Palpations: Abdomen is soft.     Tenderness: There is no abdominal tenderness.  Genitourinary:    Rectum: Normal.     Comments: Melena noted on rectal exam.  No obvious hemorrhoids or fissures.  No bright red blood per rectum. Skin:    General: Skin is warm and dry.  Neurological:     Mental Status: He is alert and oriented to person, place, and time.  Psychiatric:        Mood and Affect: Mood normal.        Behavior: Behavior normal.     (all labs ordered are listed, but only abnormal results are displayed) Labs Reviewed  COMPREHENSIVE METABOLIC PANEL WITH GFR - Abnormal; Notable for the following components:      Result Value   Glucose, Bld 191 (*)    BUN 44 (*)    Creatinine, Ser 1.81 (*)    GFR, Estimated 39 (*)    All other components within normal limits  CBC - Abnormal; Notable for the following components:   RBC 3.02 (*)    Hemoglobin 9.3 (*)    HCT 28.5 (*)    Platelets 125 (*)    All other components within normal limits  PROTIME-INR  POC OCCULT BLOOD, ED  TYPE AND SCREEN    EKG: EKG Interpretation Date/Time:  Tuesday February 09 2024 16:07:37 EST Ventricular Rate:  52 PR Interval:  211 QRS Duration:  112 QT Interval:  456 QTC Calculation: 425 R Axis:   59  Text Interpretation: Sinus rhythm Borderline intraventricular conduction delay since last tracing no significant change HR slower Confirmed by Lenor Hollering [phone]) on 02/09/2024 4:45:15 PM  Radiology: CT ANGIO GI BLEED Addendum Date: 02/09/2024  ADDENDUM #1 ADDENDUM: Contrast: 80 ml omnipaque  350 ---------------------------------------------------- Electronically  signed by: Elsie Gravely MD 02/09/2024 05:53 PM EST RP Workstation: [id number]   Result Date: 02/09/2024  ORIGINAL REPORT  EXAM: CTA ABDOMEN AND PELVIS WITH CONTRAST 02/09/2024 05:09:00 PM TECHNIQUE: CTA images of the abdomen and pelvis with intravenous contrast. Three-dimensional MIP/volume rendered formations were performed. Automated exposure control, iterative reconstruction, and/or weight based adjustment of the mA/kV was utilized to reduce the radiation dose to as low as reasonably achievable. COMPARISON: CT abdomen and pelvis 11/01/2019. CLINICAL HISTORY: Melena x1 week, suspect GI bleed, on eliquis . FINDINGS: VASCULATURE: GI BLEED: No discrete  areas of intraluminal contrast extravasation is demonstrated. No area of active gastrointestinal bleeding is identified. AORTA: Diffuse calcification of the aortoiliac arteries and mesenteric vessels. No aortic aneurysm. No dissection. CELIAC TRUNK: Patent. No occlusion or significant stenosis. SUPERIOR MESENTERIC ARTERY: Patent. No occlusion or significant stenosis. INFERIOR MESENTERIC ARTERY: Patent. No occlusion or significant stenosis. RENAL ARTERIES: Duplicated left and triplicated right renal arteries are patent. No occlusion or significant stenosis. ILIAC ARTERIES: Bilateral common iliac arteries, bilateral external iliac arteries and bilateral common femoral arteries are patent. Focal high-grade stenosis of the left internal iliac artery with 80 percent diameter reduction. ABDOMEN/PELVIS: LOWER CHEST: Visualized portion of the lower chest demonstrates no acute abnormality. Lung bases are clear. Small pericardial effusion. LIVER: The liver is unremarkable. GALLBLADDER AND BILE DUCTS: Cholelithiasis with multiple small stones layering in the gallbladder. No inflammatory stranding. No biliary ductal dilatation. SPLEEN: There is a heterogeneously hypoenhancing mass arising from the tail of the pancreas and extending to the spleen with likely invasion of the  anterior spleen. PANCREAS: There is a heterogeneously hypoenhancing mass arising from the tail of the pancreas and extending to the spleen with likely invasion of the anterior spleen. The mass measures about 5.6 x 5.4 cm. This is likely pancreatic adenocarcinoma. Focal pancreatitis with pseudocyst would be a less likely consideration. The mass is new since the previous study. No pancreatic ductal dilatation. ADRENAL GLANDS: 2.3 cm diameter right adrenal gland nodule containing macroscopic fat, likely myelolipoma. No change since prior study. KIDNEYS, URETERS AND BLADDER: Multiple bilateral renal cysts, some with thin mural calcification. No significant change since prior studies. Appearance is consistent with benign cysts and no imaging follow-up is indicated. No stones in the kidneys or ureters. No hydronephrosis. No perinephric or periureteral stranding. Urinary bladder is unremarkable. GI AND BOWEL: Loss of fat plane between the greater curvature of the stomach and the pancreatic mass suggests direct invasion into the stomach. Small bowel and colon are decompressed. Colonic diverticulosis without evidence of acute diverticulitis. There is no bowel obstruction. No abnormal bowel wall thickening or distension. No discrete areas of intraluminal contrast extravasation is demonstrated. No area of active gastrointestinal bleeding is identified. REPRODUCTIVE: The prostate gland is enlarged. PERITONEUM AND RETROPERITONEUM: No ascites or free air. LYMPH NODES: No lymphadenopathy. BONES AND SOFT TISSUES: Degenerative changes in the spine. No acute abnormality of the bones. Small periumbilical hernia containing fat with increased density suggesting fat necrosis. No bowel herniation. No acute soft tissue abnormality. IMPRESSION: 1. No active GI bleeding. 2. Heterogeneously hypoenhancing mass arising from the tail of the pancreas with likely invasion of the anterior spleen and loss of the fat plane to the gastric greater  curvature, measuring about 5.6 x 5.4 cm, likely pancreatic adenocarcinoma; focal pancreatitis with pseudocyst is a less likely consideration; new from prior study. 3. Focal high-grade stenosis of the left internal iliac artery with approximately 80 percent diameter reduction. 4. Cholelithiasis with multiple small stones layering in the gallbladder without inflammatory stranding. 5. Small periumbilical hernia containing fat with increased density suggesting fat necrosis, without bowel herniation. Electronically signed by: Elsie Gravely MD 02/09/2024 05:40 PM EST RP Workstation: [id number]     Medications Ordered in the ED  pantoprazole  (PROTONIX ) injection 40 mg (has no administration in time range)  lactated ringers  bolus 1,000 mL (1,000 mLs Intravenous New Bag/Given 02/09/24 1611)  iohexol  (OMNIPAQUE ) 350 MG/ML injection 80 mL (77 mLs Intravenous Contrast Given 02/09/24 1646)  pantoprazole  (PROTONIX ) injection 80 mg (80 mg Intravenous Given 02/09/24 1858)  Clinical Course as of 02/09/24 1930  Tue Feb 09, 2024  1622 Creatinine(!): 1.81 New AKI [AY]  1622 Hemoglobin(!): 9.3 Decreased from baseline [AY]  1753 Occult stool is positive [AY]    Clinical Course User Index [AY] Neysa Thersia RAMAN, PA-C                                Medical Decision Making Amount and/or Complexity of Data Reviewed Labs: ordered. Decision-making details documented in ED Course. Radiology: ordered.  Risk Prescription drug management. Decision regarding hospitalization.   Patient is a 73 year old male with a history of hypertension, hyperlipidemia, type 2 diabetes, and A-fib on Eliquis  who presents to the ED from PCP for increased weakness/fatigue for the past 5 days as well as melena.  Please see detailed HPI above.  On exam patient is alert and well-appearing.  Physical exam as noted above.  Vital signs stable.  Of note patient's orthostatic vital signs at PCP were positive earlier today.  Hemoglobin is  noted to be 9.3 which is decreased from fifteen 1 year prior.  AKI with a creatinine of 1.8 noted as well.  Occult stool was positive.  CT angio of the abdomen for GI bleed was negative for acute GI bleed.  However unfortunately there was incidentally found to be a 5 x 5 likely pancreatic mass.  Patient and wife made aware of findings.  There are concerns for acute upper GI bleed in the setting of decreased hemoglobin as well as positive occult stool.  As patient is on Eliquis , we do feel admission for further evaluation would be beneficial.  Patient given a liter of fluids and 80 mg Protonix  while in ED.  Will be placed on Protonix  twice daily starting tomorrow.  Placed n.p.o. after midnight.  Patient was noted to see Dr. Dianna with [facility] once last year. Dr Kriss on call with their group has been made aware of patient.  Hospitalist consulted and are agreeable to admit patient for further evaluation.  Patient stable while in ED.     Final diagnoses:  Melena  Gastrointestinal hemorrhage, unspecified gastrointestinal hemorrhage type  Pancreatic mass  AKI (acute kidney injury)    ED Discharge Orders     None          Neysa Thersia RAMAN, PA-C 02/09/24 1931    Lenor Hollering, MD 02/09/24 2050

## 2024-02-09 NOTE — Assessment & Plan Note (Signed)
On  Farxiga

## 2024-02-09 NOTE — Assessment & Plan Note (Addendum)
 Hold Eliquis  Last does was few days ago  Given sot BP will hold toprol 

## 2024-02-09 NOTE — Assessment & Plan Note (Signed)
 Obtain anemia panel  Transfuse for Hg <7 , rapidly dropping or  if symptomatic

## 2024-02-09 NOTE — Assessment & Plan Note (Signed)
-   Glasgow Blatchford score BUN >18.2  , Hg <67M melena         Modifying risk factors include:     NSAIDS use  Prior hx of GI bleed        -   hemodynamic instability/sift bp present, use of AC      -  Admit to progressive given above    -  ER  Provider spoke to gastroenterology ETTER EE ) they will see patient in a.m. appreciate their consult   - serial CBC.    - Monitor for any recurrence,  evidence of hemodynamic instability or significant blood loss  - Transfuse as needed for hemoglobin below 7 or evidence of life-threatening bleeding  - Establish at least 2 PIV and fluid resuscitate   - clear liquids for tonight keep nothing by mouth post midnight,   -  administer Protonix    twice a day

## 2024-02-09 NOTE — H&P (Signed)
 [name] [name] FMW:[id number] DOB: 12-07-1950 DOA: 02/09/2024     PCP: [name], NP   Outpatient Specialists:  CARDS:  Dr. Lonni Cash, MD    GI  Dr.  Gwen  ) [name], MD    Patient arrived to ER on 02/09/24 at 1409 Referred by Attending Ashli Selders, MD   Patient coming from:    home Lives  With family     Chief Complaint:   Chief Complaint  Patient presents with   Melena    HPI: [name] [name] is a 73 y.o. male with medical history significant of a.fib of eliquis , CAD, DM2, SVT, sleep apnea, pericardial effusion, HLD, Hyperparathyroidism    Presented with  melena Having been having black stools for 4 days, lightheaded, worse when stands up  No CP no epigastric pain, Have had lower GI bleed and diverticulitis in the past It was [facility], Dr. Dianna did colonoscopy 3 years ago.   In October has been taking naproxen for back pain now on eliquis  since A.fib was found       Denies significant ETOH intake  drinks occasional beer Does not smoke      Regarding pertinent Chronic problems:    Hyperlipidemia -  on statins Crestor  Lipid Panel     Component Value Date/Time   CHOL 155 06/10/2023 1225   CHOL 160 02/04/2023 1140   TRIG 83.0 06/10/2023 1225   HDL 53.10 06/10/2023 1225   HDL 54 02/04/2023 1140   CHOLHDL 3 06/10/2023 1225   VLDL 16.6 06/10/2023 1225   LDLCALC 86 06/10/2023 1225   LDLCALC 83 02/04/2023 1140   LABVLDL 23 02/04/2023 1140     HTN on Diovan , NOrvasc , toproll, Sprinolactone   chronic CHF diastolic - last echo  Recent Results (from the past 56199 hours)  ECHOCARDIOGRAM COMPLETE   Collection Time: 06/18/23  7:46 AM  Result Value   Area-P 1/2 4.40   S' Lateral 3.80   Est EF 55 - 60%   Narrative      ECHOCARDIOGRAM REPORT          1. Left ventricular ejection fraction, by estimation, is 55 to 60%. Left ventricular ejection fraction by 3D volume is 55 %. The left ventricle has normal function. The  left ventricle has no regional wall motion abnormalities. The left ventricular  internal cavity size was moderately dilated. Left ventricular diastolic parameters are consistent with Grade I diastolic dysfunction (impaired relaxation). Elevated left ventricular end-diastolic pressure. The average left ventricular global longitudinal  strain is -24.8 %. The global longitudinal strain is normal.  2. Right ventricular systolic function is normal. The right ventricular size is normal. Tricuspid regurgitation signal is inadequate for assessing PA pressure.  3. A small pericardial effusion is present. The pericardial effusion is circumferential.  4. The mitral valve is normal in structure. Trivial mitral valve regurgitation. No evidence of mitral stenosis.  5. The aortic valve is tricuspid. Aortic valve regurgitation is mild. Aortic valve sclerosis/calcification is present, without any evidence of aortic stenosis.  6. Aortic dilatation noted. There is mild dilatation of the aortic root, measuring 42 mm. There is mild dilatation of the ascending aorta, measuring 41 mm.  7. The inferior vena cava is normal in size with greater than 50% respiratory variability, suggesting right atrial pressure of 3 mmHg.           CAD  - On Aspirin, statin, betablocker,                  -  followed by cardiology                    DM 2 -  Lab Results  Component Value Date   HGBA1C 9.4 (H) 06/10/2023   on  PO meds only       OSA -on nocturnal   CPAP      A. Fib -   atrial fibrillation CHA2DS2 vas score   5       current  on anticoagulation with   Eliquis ,           -  Rate control:  Currently controlled with  Toprolol,        Hepatic Function Panel     Component Value Date/Time   PROT 7.1 02/09/2024 1430   PROT 6.8 02/04/2023 1140   ALBUMIN 4.4 02/09/2024 1430   ALBUMIN 4.5 02/04/2023 1140   AST 28 02/09/2024 1430   ALT 22 02/09/2024 1430   ALKPHOS 47 02/09/2024 1430   BILITOT 1.1 02/09/2024 1430    BILITOT 2.4 (H) 02/04/2023 1140   BILIDIR 0.2 11/10/2011 0842   IBILI 0.7 11/10/2011 0842       While in ER: Clinical Course as of 02/09/24 1906  Tue Feb 09, 2024  1622 Creatinine(!): 1.81 New AKI [AY]  1622 Hemoglobin(!): 9.3 Decreased from baseline [AY]  1753 Occult stool is positive [AY]    Clinical Course User Index [AY] Neysa Thersia RAMAN, PA-C       Lab Orders         Comprehensive metabolic panel         CBC         Protime-INR - (order if Patient is taking Coumadin / Warfarin)         POC occult blood, ED        CTabd/pelvis - 1. No active GI bleeding. 2. Heterogeneously hypoenhancing mass arising from the tail of the pancreas with likely invasion of the anterior spleen and loss of the fat plane to the gastric greater curvature, measuring about 5.6 x 5.4 cm, likely pancreatic adenocarcinoma; focal pancreatitis with pseudocyst is a less likely consideration; new from prior study. 3. Focal high-grade stenosis of the left internal iliac artery with approximately 80 percent diameter reduction. 4. Cholelithiasis with multiple small stones layering in the gallbladder without inflammatory stranding. 5. Small periumbilical hernia containing fat with increased density suggesting fat necrosis, without bowel herniation.    Following Medications were ordered in ER: Medications  pantoprazole  (PROTONIX ) injection 40 mg (has no administration in time range)  lactated ringers  bolus 1,000 mL (1,000 mLs Intravenous New Bag/Given 02/09/24 1611)  iohexol  (OMNIPAQUE ) 350 MG/ML injection 80 mL (77 mLs Intravenous Contrast Given 02/09/24 1646)  pantoprazole  (PROTONIX ) injection 80 mg (80 mg Intravenous Given 02/09/24 1858)    _______________________________________________________ ER Provider Called:    [facility]    Dr. Kriss  They Recommend admit to medicine   Will see in AM        ED Triage Vitals [02/09/24 1416]  Encounter Vitals Group     BP 120/62     Girls Systolic  BP Percentile      Girls Diastolic BP Percentile      Boys Systolic BP Percentile      Boys Diastolic BP Percentile      Pulse Rate 62     Resp 18     Temp 97.8 F (36.6 C)     Temp Source Oral     SpO2 100 %  Weight      Height      Head Circumference      Peak Flow      Pain Score      Pain Loc      Pain Education      Exclude from Growth Chart   [id number]     _________________________________________ Significant initial  Findings: Abnormal Labs Reviewed  COMPREHENSIVE METABOLIC PANEL WITH GFR - Abnormal; Notable for the following components:      Result Value   Glucose, Bld 191 (*)    BUN 44 (*)    Creatinine, Ser 1.81 (*)    GFR, Estimated 39 (*)    All other components within normal limits  CBC - Abnormal; Notable for the following components:   RBC 3.02 (*)    Hemoglobin 9.3 (*)    HCT 28.5 (*)    Platelets 125 (*)    All other components within normal limits     ECG: Ordered Personally reviewed and interpreted by me showing: HR : 52 Rhythm: Sinus rhythm Borderline intraventricular conduction delay QTC 424    The recent clinical data is shown below. Vitals:   02/09/24 1416 02/09/24 1723 02/09/24 1830  BP: 120/62 (!) 115/56   Pulse: 62 60   Resp: 18 12 16   Temp: 97.8 F (36.6 C) 97.9 F (36.6 C)   TempSrc: Oral Oral   SpO2: 100% 98%       WBC     Component Value Date/Time   WBC 6.7 02/09/2024 1430   LYMPHSABS 1.0 02/04/2023 1140   MONOABS 0.6 08/07/2022 1558   EOSABS 0.1 02/04/2023 1140   BASOSABS 0.1 02/04/2023 1140      UA   ordered     Results for orders placed or performed during the hospital encounter of 08/07/22  MRSA Next Gen by PCR, Nasal     Status: None   Collection Time: 08/07/22 11:24 PM   Specimen: Nasal Mucosa; Nasal Swab  Result Value Ref Range Status   MRSA by PCR Next Gen NOT DETECTED NOT DETECTED Final    Comment: (NOTE) The GeneXpert MRSA Assay (FDA approved for NASAL specimens only), is one component of a  comprehensive MRSA colonization surveillance program. It is not intended to diagnose MRSA infection nor to guide or monitor treatment for MRSA infections. Test performance is not FDA approved in patients less than 37 years old. Performed at [facility], [address] [place], [place] [address]    ___________________________________________________ Recent Labs  Lab 02/09/24 1430  NA 136  K 4.6  CO2 23  GLUCOSE 191*  BUN 44*  CREATININE 1.81*  CALCIUM  10.1    Cr  Up from baseline see below Lab Results  Component Value Date   CREATININE 1.81 (H) 02/09/2024   CREATININE 1.03 05/13/2023   CREATININE 1.16 04/17/2023    Recent Labs  Lab 02/09/24 1430  AST 28  ALT 22  ALKPHOS 47  BILITOT 1.1  PROT 7.1  ALBUMIN 4.4   Lab Results  Component Value Date   CALCIUM  10.1 02/09/2024   PHOS 3.4 08/08/2022       Plt: Lab Results  Component Value Date   PLT 125 (L) 02/09/2024         Recent Labs  Lab 02/09/24 1430  WBC 6.7  HGB 9.3*  HCT 28.5*  MCV 94.4  PLT 125*    HG/HCT Down      Component Value Date/Time   HGB 9.3 (L) 02/09/2024  1430   HGB 15.0 02/04/2023 1140   HCT 28.5 (L) 02/09/2024 1430   HCT 45.9 02/04/2023 1140   MCV 94.4 02/09/2024 1430   MCV 87 02/04/2023 1140     _______________________________________________ Hospitalist was called for admission for   Melena  Gastrointestinal hemorrhage, unspecified gastrointestinal hemorrhage type    Pancreatic mass   The following Work up has been ordered so far:  Orders Placed This Encounter  Procedures   CT ANGIO GI BLEED   Comprehensive metabolic panel   CBC   Protime-INR - (order if Patient is taking Coumadin / Warfarin)   Diet NPO time specified   Diet NPO time specified   Initiate Carrier Fluid Protocol   Place X2 Large Bore IV's   Cardiac Monitoring - Continuous Indefinite   Consult to hospitalist   POC occult blood, ED   ED EKG   EKG 12-Lead   Type and screen  [facility]   Insert peripheral IV   Admit to Inpatient (patient's expected length of stay will be greater than 2 midnights or inpatient only procedure)     OTHER Significant initial  Findings:  labs showing:     DM  labs:  HbA1C: Recent Labs    03/12/23 1203 06/10/23 1225  HGBA1C 7.5* 9.4*       CBG (last 3)  No results for input(s): GLUCAP in the last 72 hours.        Cultures: No results found for: SDES, SPECREQUEST, CULT, REPTSTATUS   Radiological Exams on Admission: CT ANGIO GI BLEED Addendum Date: 02/09/2024  ADDENDUM #1 ADDENDUM: Contrast: 80 ml omnipaque  350 ---------------------------------------------------- Electronically signed by: Elsie Gravely MD 02/09/2024 05:53 PM EST RP Workstation: [id number]   Result Date: 02/09/2024   ORIGINAL REPORT * EXAM: CTA ABDOMEN AND PELVIS WITH CONTRAST 02/09/2024 05:09:00 PM TECHNIQUE: CTA images of the abdomen and pelvis with intravenous contrast. Three-dimensional MIP/volume rendered formations were performed. Automated exposure control, iterative reconstruction, and/or weight based adjustment of the mA/kV was utilized to reduce the radiation dose to as low as reasonably achievable. COMPARISON: CT abdomen and pelvis 11/01/2019. CLINICAL HISTORY: Melena x1 week, suspect GI bleed, on eliquis . FINDINGS: VASCULATURE: GI BLEED: No discrete areas of intraluminal contrast extravasation is demonstrated. No area of active gastrointestinal bleeding is identified. AORTA: Diffuse calcification of the aortoiliac arteries and mesenteric vessels. No aortic aneurysm. No dissection. CELIAC TRUNK: Patent. No occlusion or significant stenosis. SUPERIOR MESENTERIC ARTERY: Patent. No occlusion or significant stenosis. INFERIOR MESENTERIC ARTERY: Patent. No occlusion or significant stenosis. RENAL ARTERIES: Duplicated left and triplicated right renal arteries are patent. No occlusion or significant stenosis. ILIAC ARTERIES:  Bilateral common iliac arteries, bilateral external iliac arteries and bilateral common femoral arteries are patent. Focal high-grade stenosis of the left internal iliac artery with 80 percent diameter reduction. ABDOMEN/PELVIS: LOWER CHEST: Visualized portion of the lower chest demonstrates no acute abnormality. Lung bases are clear. Small pericardial effusion. LIVER: The liver is unremarkable. GALLBLADDER AND BILE DUCTS: Cholelithiasis with multiple small stones layering in the gallbladder. No inflammatory stranding. No biliary ductal dilatation. SPLEEN: There is a heterogeneously hypoenhancing mass arising from the tail of the pancreas and extending to the spleen with likely invasion of the anterior spleen. PANCREAS: There is a heterogeneously hypoenhancing mass arising from the tail of the pancreas and extending to the spleen with likely invasion of the anterior spleen. The mass measures about 5.6 x 5.4 cm. This is likely pancreatic adenocarcinoma. Focal pancreatitis with pseudocyst would be a less  likely consideration. The mass is new since the previous study. No pancreatic ductal dilatation. ADRENAL GLANDS: 2.3 cm diameter right adrenal gland nodule containing macroscopic fat, likely myelolipoma. No change since prior study. KIDNEYS, URETERS AND BLADDER: Multiple bilateral renal cysts, some with thin mural calcification. No significant change since prior studies. Appearance is consistent with benign cysts and no imaging follow-up is indicated. No stones in the kidneys or ureters. No hydronephrosis. No perinephric or periureteral stranding. Urinary bladder is unremarkable. GI AND BOWEL: Loss of fat plane between the greater curvature of the stomach and the pancreatic mass suggests direct invasion into the stomach. Small bowel and colon are decompressed. Colonic diverticulosis without evidence of acute diverticulitis. There is no bowel obstruction. No abnormal bowel wall thickening or distension. No discrete  areas of intraluminal contrast extravasation is demonstrated. No area of active gastrointestinal bleeding is identified. REPRODUCTIVE: The prostate gland is enlarged. PERITONEUM AND RETROPERITONEUM: No ascites or free air. LYMPH NODES: No lymphadenopathy. BONES AND SOFT TISSUES: Degenerative changes in the spine. No acute abnormality of the bones. Small periumbilical hernia containing fat with increased density suggesting fat necrosis. No bowel herniation. No acute soft tissue abnormality. IMPRESSION: 1. No active GI bleeding. 2. Heterogeneously hypoenhancing mass arising from the tail of the pancreas with likely invasion of the anterior spleen and loss of the fat plane to the gastric greater curvature, measuring about 5.6 x 5.4 cm, likely pancreatic adenocarcinoma; focal pancreatitis with pseudocyst is a less likely consideration; new from prior study. 3. Focal high-grade stenosis of the left internal iliac artery with approximately 80 percent diameter reduction. 4. Cholelithiasis with multiple small stones layering in the gallbladder without inflammatory stranding. 5. Small periumbilical hernia containing fat with increased density suggesting fat necrosis, without bowel herniation. Electronically signed by: Elsie Gravely MD 02/09/2024 05:40 PM EST RP Workstation: [id number]   _______________________________________________________________________________________________________ Latest  Blood pressure (!) 115/56, pulse 60, temperature 97.9 F (36.6 C), temperature source Oral, resp. rate 16, SpO2 98%.   Vitals  labs and radiology finding personally reviewed  Review of Systems:    Pertinent positives include:  lightheaded melena, Constitutional:  No weight loss, night sweats, Fevers, chills, fatigue, weight loss  HEENT:  No headaches, Difficulty swallowing,Tooth/dental problems,Sore throat,  No sneezing, itching, ear ache, nasal congestion, post nasal drip,  Cardio-vascular:  No chest pain,  Orthopnea, PND, anasarca, dizziness, palpitations.no Bilateral lower extremity swelling  GI:  No heartburn, indigestion, abdominal pain, nausea, vomiting, diarrhea, change in bowel habits, loss of appetite,  blood in stool, hematemesis Resp:  no shortness of breath at rest. No dyspnea on exertion, No excess mucus, no productive cough, No non-productive cough, No coughing up of blood.No change in color of mucus.No wheezing. Skin:  no rash or lesions. No jaundice GU:  no dysuria, change in color of urine, no urgency or frequency. No straining to urinate.  No flank pain.  Musculoskeletal:  No joint pain or no joint swelling. No decreased range of motion. No back pain.  Psych:  No change in mood or affect. No depression or anxiety. No memory loss.  Neuro: no localizing neurological complaints, no tingling, no weakness, no double vision, no gait abnormality, no slurred speech, no confusion  All systems reviewed and apart from HOPI all are negative _______________________________________________________________________________________________ Past Medical History:   Past Medical History:  Diagnosis Date   Atrial fibrillation (HCC)    Dyslipidemia    ED (erectile dysfunction)    Hyperlipidemia    Hyperparathyroidism    Hypertension  Hypogonadism, male    Sleep apnea       Past Surgical History:  Procedure Laterality Date   PARATHYROIDECTOMY     SKIN BIOPSY Left 03/02/2019   squamous cell carcinoma in situ, hypertrophic, traumatized   SKIN BIOPSY Right 08/01/2021   residual squamous cell caarcinoma    Social History:  Ambulatory   independently      reports that he has never smoked. He has never used smokeless tobacco. He reports current alcohol use of about 14.0 standard drinks of alcohol per week. He reports that he does not use drugs.   Family History:   Family History  Problem Relation Age of Onset   Hypertension Father    Hyperlipidemia Father     ______________________________________________________________________________________________ Allergies: Allergies  Allergen Reactions   Yellow Jacket Venom Anaphylaxis     Prior to Admission medications   Medication Sig Start Date End Date Taking? Authorizing Provider  Accu-Chek FastClix Lancets MISC 1 each by Does not apply route daily. 10/16/20   Joyce Norleen BROCKS, MD  amLODipine  (NORVASC ) 10 MG tablet Take 1 tablet (10 mg total) by mouth daily. for blood pressure. 09/28/23   Clark, Katherine K, NP  apixaban  (ELIQUIS ) 5 MG TABS tablet Take 1 tablet (5 mg total) by mouth 2 (two) times daily. 05/14/23   [name], MD  dapagliflozin  propanediol (FARXIGA ) 10 MG TABS tablet TAKE 1 TABLET (10 MG TOTAL) BY MOUTH DAILY BEFORE BREAKFAST. FOR DIABETES. 12/04/23   [name], NP  EPINEPHrine  (EPI-PEN) 0.3 mg/0.3 mL DEVI Inject 0.3 mLs (0.3 mg total) into the muscle once. Patient taking differently: Inject 0.3 mg into the muscle once as needed (for anaphylaxis). 03/14/14   Joyce Norleen BROCKS, MD  fish oil-omega-3 fatty acids 1000 MG capsule Take 2 g by mouth daily.    [provider]  gabapentin  (NEURONTIN ) 300 MG capsule 300 mg 3 (three) times daily as needed. 06/19/23   [provider]  Glucosamine-Chondroit-Vit C-Mn (GLUCOSAMINE 1500 COMPLEX) CAPS Take 1 capsule by mouth daily.     [provider]  glucose blood (ACCU-CHEK GUIDE) test strip Use one daily to check blood sugar 10/16/20   Joyce Norleen BROCKS, MD  Iron , Ferrous Sulfate , 325 (65 Fe) MG TABS Take 65 mg by mouth daily with breakfast. Patient not taking: Reported on 02/09/2024 08/09/22 02/09/24  [place], Camellia PARAS, DO  metoprolol  succinate (TOPROL -XL) 25 MG 24 hr tablet TAKE 1 TABLET BY MOUTH EVERY DAY FOR BLOOD PRESSURE 12/13/23   Clark, Katherine K, NP  Multiple Vitamins-Minerals (MULTIVITAMIN WITH MINERALS) tablet Take 1 tablet by mouth daily.    [provider]  PRESCRIPTION MEDICATION CPAP- At  bedtime    [provider]  rosuvastatin  (CRESTOR ) 40 MG tablet TAKE 1 TABLET BY MOUTH EVERY DAY 09/22/23   Joyce Norleen BROCKS, MD  spironolactone  (ALDACTONE ) 50 MG tablet TAKE 1 TABLET (50 MG TOTAL) BY MOUTH DAILY. FOR BLOOD PRESSURE. 11/02/23   Clark, Katherine K, NP  tadalafil  (CIALIS ) 20 MG tablet Take 1 tablet (20 mg total) by mouth daily as needed for erectile dysfunction. 02/04/23   Joyce Norleen BROCKS, MD  Testosterone  20.25 MG/ACT (1.62%) GEL APPLY 2 PUMPS TO THE SHOULDER AND ARM DAILY 01/22/24   Clark, Katherine K, NP  valsartan  (DIOVAN ) 320 MG tablet TAKE 1 TABLET (320 MG TOTAL) BY MOUTH DAILY. FOR BLOOD PRESSURE. 12/04/23 12/03/24  [name], NP    ___________________________________________________________________________________________________ Physical Exam:    02/09/2024    5:23 PM 02/09/2024  2:16 PM 02/09/2024   12:34 PM  Vitals with BMI  Systolic 115 120 889  Diastolic 56 62 50  Pulse 60 62      1. General:  in No  Acute distress   Chronically ill   -appearing 2. Psychological: Alert and   Oriented 3. Head/ENT:   Dry Mucous Membranes                          Head Non traumatic, neck supple                           Poor Dentition 4. SKIN: normal   Skin turgor,  Skin clean Dry and intact no rash    5. Heart: Regular rate and rhythm no  Murmur, no Rub or gallop 6. Lungs:   no wheezes or crackles   7. Abdomen: Soft,  non-tender, Non distended   obese  bowel sounds present 8. Lower extremities: no clubbing, cyanosis, no  edema 9. Neurologically Grossly intact, moving all 4 extremities equally   10. MSK: Normal range of motion    Chart has been reviewed  ______________________________________________________________________________________________  Assessment/Plan 73 y.o. male with medical history significant of a.fib of eliquis , CAD, DM2, SVT, sleep apnea, pericardial effusion, HLD, Hyperparathyroidism  Admitted for   Melena  Gastrointestinal  hemorrhage, unspecified gastrointestinal hemorrhage type  Pancreatic mass     Present on Admission:  Upper GI bleed  Atrial fibrillation (HCC)  Diabetic nephropathy associated with type 2 diabetes mellitus (HCC)  Essential hypertension  Hyperlipidemia  Melena  Sleep apnea  Type 2 diabetes mellitus with hyperglycemia (HCC)  AKI (acute kidney injury)  Pancreatic mass  Chronic diastolic CHF (congestive heart failure) (HCC)  Symptomatic anemia     Atrial fibrillation (HCC) Hold Eliquis  Last does was few days ago  Given sot BP will hold toprol   Diabetic nephropathy associated with type 2 diabetes mellitus (HCC) On Farxiga   Essential hypertension Allow permissive htn   Hyperlipidemia Continue Crestor  40 mg po q day  Melena Appreciate [facility] consult  Sleep apnea Hold off on CPAP for now  Type 2 diabetes mellitus with hyperglycemia (HCC)  - Order Sensitive SSI    -  check TSH and HgA1C  - Hold by mouth medications    Upper GI bleed  - Glasgow Blatchford score BUN >18.2  , Hg <40M melena         Modifying risk factors include:     NSAIDS use  Prior hx of GI bleed        -   hemodynamic instability/sift bp present, use of AC      -  Admit to progressive given above    -  ER  Provider spoke to gastroenterology ( [facility] ) they will see patient in a.m. appreciate their consult   - serial CBC.    - Monitor for any recurrence,  evidence of hemodynamic instability or significant blood loss  - Transfuse as needed for hemoglobin below 7 or evidence of life-threatening bleeding  - Establish at least 2 PIV and fluid resuscitate   - clear liquids for tonight keep nothing by mouth post midnight,   -  administer Protonix    twice a day     AKI (acute kidney injury) -  evidence of acute renal failure due to presence of following: Cr increased >0.3 from baseline   likely secondary to dehydration,  check FeNA       Rehydrate with IV fluids      HOLD  ACE/ARBi and  nephrotoxic medications     Pancreatic mass Measuring 5 x 5 cm with potential invasion of the spleen and abutting stomach Appreciate GI consult will likely need referral to oncology Check Ca 19  Chronic diastolic CHF (congestive heart failure) (HCC) Chronic stable avoid overaggressive fluid resuscitation  Symptomatic anemia Obtain anemia panel  Transfuse for Hg <7 , rapidly dropping or  if symptomatic    Other plan as per orders.  DVT prophylaxis:  SCD      Code Status:    Code Status: Prior FULL CODE   as per patient   I had personally discussed CODE STATUS with patient   ACP   none   Family Communication:   Family  at  Bedside  plan of care was discussed   with  Wife,    Diet  Diet Orders (From admission, onward)     Start     Ordered   02/10/24 0001  Diet NPO time specified  Diet effective midnight        02/09/24 1835   02/09/24 1420  Diet NPO time specified  Diet effective now        02/09/24 1420            Disposition Plan:       To home once workup is complete and patient is stable   Following barriers for discharge:                                                         Anemia corrected h/H stable                            ving able to transition to PO antibiotics                             Will need to be able to tolerate PO                                                     Will need consultants to evaluate patient prior to discharge                                  Consult Orders  (From admission, onward)           Start     Ordered   02/09/24 1754  Consult to hospitalist  Once       Provider:  (Not yet assigned)  Question Answer Comment  Place call to: [facility]   Reason for Consult Admit      02/09/24 1753                               Consults called: [facility]   Sent msg to Oncology   Admission status:  ED Disposition     ED  Disposition  Admit   Condition  --   Comment  H[facility] [[id number]]  Level of Care: Progressive [102]  Admit to Progressive based on following criteria: GI, ENDOCRINE disease patients with GI bleeding, acute liver failure or pancreatitis, stable with diabetic ketoacidosis or thyrotoxicosis (hypothyroid) state.  May admit patient to [facility] or [facility] if equivalent level of care is available:: No  Bed request comments: gi bleed  Diagnosis: Upper GI bleed [[id number]]  Admitting Physician: Florena Kozma [[id number]]  Attending Physician: Karren Newland [[id number]]  Certification:: I certify this patient will need inpatient services for at least 2 midnights  Expected Medical Readiness: 02/11/2024            inpatient     I Expect 2 midnight stay secondary to severity of patient's current illness need for inpatient interventions justified by the following:  Severe lab/radiological/exam abnormalities including:    Melena  Gastrointestinal hemorrhage,  Pancreatic mass  and extensive comorbidities including: DM2   CHF  CAD CKD   That are currently affecting medical management.   I expect  patient to be hospitalized for 2 midnights requiring inpatient medical care.  Patient is at high risk for adverse outcome (such as loss of life or disability) if not treated.  Indication for inpatient stay as follows:   inability to maintain oral hydration   Need for operative/procedural  intervention    Need for  IV fluids,, IV  PPI    Level of care    progressive      Khai Torbert 02/09/2024, 7:52 PM    [facility]     after 2 AM please page floor coverage   If 7AM-7PM, please contact the day team taking care of the patient using [email or web address]

## 2024-02-09 NOTE — Assessment & Plan Note (Signed)
-    evidence of acute renal failure due to presence of following: Cr increased >0.3 from baseline   likely secondary to dehydration,       check FeNA       Rehydrate with IV fluids      HOLD  ACE/ARBi and nephrotoxic medications    

## 2024-02-09 NOTE — Assessment & Plan Note (Signed)
 Continue Crestor  40mg  po q day

## 2024-02-09 NOTE — Assessment & Plan Note (Signed)
 Measuring 5 x 5 cm with potential invasion of the spleen and abutting stomach Appreciate GI consult will likely need referral to oncology Check Ca 19

## 2024-02-09 NOTE — Assessment & Plan Note (Signed)
 Recent NSAID use for months followed by eliquis  commencement.  Now with several days of melena associated with orthostatic lightheadedness and malaise, orthostatics in office positive. Concern for upper GI bleed - recommend ER evaluation today for labwork, further evaluation, stabilization. He and wife agree with plan.

## 2024-02-09 NOTE — Subjective & Objective (Signed)
 Having been having black stools for 4 days, lightheaded, worse when stands up  No CP no epigastric pain, Have had lower GI bleed and diverticulitis in the past It was [facility], Dr. Dianna did colonoscopy 3 years ago.

## 2024-02-09 NOTE — Assessment & Plan Note (Signed)
 Allow permissive htn ?

## 2024-02-10 ENCOUNTER — Encounter (HOSPITAL_COMMUNITY): Payer: Self-pay | Admitting: Internal Medicine

## 2024-02-10 ENCOUNTER — Observation Stay (HOSPITAL_COMMUNITY)

## 2024-02-10 ENCOUNTER — Inpatient Hospital Stay (HOSPITAL_COMMUNITY): Admitting: Anesthesiology

## 2024-02-10 ENCOUNTER — Encounter (HOSPITAL_COMMUNITY)
Admission: EM | Disposition: A | Discharge status: Home / Self Care | Payer: Self-pay | Source: Ambulatory Visit | Attending: Emergency Medicine

## 2024-02-10 DIAGNOSIS — I5032 Chronic diastolic (congestive) heart failure: Secondary | ICD-10-CM

## 2024-02-10 DIAGNOSIS — K2100 Gastro-esophageal reflux disease with esophagitis, without bleeding: Secondary | ICD-10-CM | POA: Diagnosis not present

## 2024-02-10 DIAGNOSIS — K222 Esophageal obstruction: Secondary | ICD-10-CM

## 2024-02-10 DIAGNOSIS — C259 Malignant neoplasm of pancreas, unspecified: Secondary | ICD-10-CM | POA: Diagnosis not present

## 2024-02-10 DIAGNOSIS — I7121 Aneurysm of the ascending aorta, without rupture: Secondary | ICD-10-CM | POA: Diagnosis not present

## 2024-02-10 DIAGNOSIS — I110 Hypertensive heart disease with heart failure: Secondary | ICD-10-CM | POA: Diagnosis not present

## 2024-02-10 DIAGNOSIS — I4891 Unspecified atrial fibrillation: Secondary | ICD-10-CM

## 2024-02-10 DIAGNOSIS — I700 Atherosclerosis of aorta: Secondary | ICD-10-CM | POA: Diagnosis not present

## 2024-02-10 DIAGNOSIS — I3139 Other pericardial effusion (noninflammatory): Secondary | ICD-10-CM | POA: Diagnosis not present

## 2024-02-10 DIAGNOSIS — K2990 Gastroduodenitis, unspecified, without bleeding: Secondary | ICD-10-CM | POA: Diagnosis not present

## 2024-02-10 DIAGNOSIS — K922 Gastrointestinal hemorrhage, unspecified: Secondary | ICD-10-CM | POA: Diagnosis not present

## 2024-02-10 DIAGNOSIS — K2980 Duodenitis without bleeding: Secondary | ICD-10-CM | POA: Diagnosis not present

## 2024-02-10 DIAGNOSIS — K2950 Unspecified chronic gastritis without bleeding: Secondary | ICD-10-CM | POA: Diagnosis not present

## 2024-02-10 HISTORY — PX: ESOPHAGOGASTRODUODENOSCOPY: SHX5428

## 2024-02-10 LAB — URINALYSIS, COMPLETE (UACMP) WITH MICROSCOPIC
Bacteria, UA: NONE SEEN
Bilirubin Urine: NEGATIVE
Glucose, UA: >=500 mg/dL — AB
Hgb urine dipstick: NEGATIVE
Ketones, ur: NEGATIVE mg/dL
Leukocytes,Ua: NEGATIVE
Nitrite: NEGATIVE
Protein, ur: NEGATIVE mg/dL
Specific Gravity, Urine: 1.016 (ref 1.005–1.030)
pH: 5 (ref 5.0–8.0)

## 2024-02-10 LAB — CBC
HCT: 24.3 % — ABNORMAL LOW (ref 39.0–52.0)
HCT: 26.5 % — ABNORMAL LOW (ref 39.0–52.0)
HCT: 26.9 % — ABNORMAL LOW (ref 39.0–52.0)
Hemoglobin: 7.8 g/dL — ABNORMAL LOW (ref 13.0–17.0)
Hemoglobin: 8.5 g/dL — ABNORMAL LOW (ref 13.0–17.0)
Hemoglobin: 8.5 g/dL — ABNORMAL LOW (ref 13.0–17.0)
MCH: 30.5 pg (ref 26.0–34.0)
MCH: 30.7 pg (ref 26.0–34.0)
MCH: 30.6 pg (ref 26.0–34.0)
MCHC: 32.1 g/dL (ref 30.0–36.0)
MCHC: 32.1 g/dL (ref 30.0–36.0)
MCHC: 31.6 g/dL (ref 30.0–36.0)
MCV: 94.9 fL (ref 80.0–100.0)
MCV: 95.7 fL (ref 80.0–100.0)
MCV: 96.8 fL (ref 80.0–100.0)
Platelets: 98 K/uL — ABNORMAL LOW (ref 150–400)
Platelets: 99 K/uL — ABNORMAL LOW (ref 150–400)
Platelets: 98 K/uL — ABNORMAL LOW (ref 150–400)
RBC: 2.56 MIL/uL — ABNORMAL LOW (ref 4.22–5.81)
RBC: 2.77 MIL/uL — ABNORMAL LOW (ref 4.22–5.81)
RBC: 2.78 MIL/uL — ABNORMAL LOW (ref 4.22–5.81)
RDW: 14.6 % (ref 11.5–15.5)
RDW: 14.6 % (ref 11.5–15.5)
RDW: 14.4 % (ref 11.5–15.5)
WBC: 4 K/uL (ref 4.0–10.5)
WBC: 4.1 K/uL (ref 4.0–10.5)
WBC: 4 K/uL (ref 4.0–10.5)
nRBC: 0 % (ref 0.0–0.2)
nRBC: 0 % (ref 0.0–0.2)
nRBC: 0 % (ref 0.0–0.2)

## 2024-02-10 LAB — COMPREHENSIVE METABOLIC PANEL WITH GFR
ALT: 17 U/L (ref 0–44)
AST: 20 U/L (ref 15–41)
Albumin: 3.7 g/dL (ref 3.5–5.0)
Alkaline Phosphatase: 39 U/L (ref 38–126)
Anion gap: 11 (ref 5–15)
BUN: 37 mg/dL — ABNORMAL HIGH (ref 8–23)
CO2: 22 mmol/L (ref 22–32)
Calcium: 9.1 mg/dL (ref 8.9–10.3)
Chloride: 105 mmol/L (ref 98–111)
Creatinine, Ser: 1.6 mg/dL — ABNORMAL HIGH (ref 0.61–1.24)
GFR, Estimated: 45 mL/min — ABNORMAL LOW (ref >60)
Glucose, Bld: 136 mg/dL — ABNORMAL HIGH (ref 70–99)
Potassium: 3.8 mmol/L (ref 3.5–5.1)
Sodium: 138 mmol/L (ref 135–145)
Total Bilirubin: 0.9 mg/dL (ref 0.0–1.2)
Total Protein: 5.7 g/dL — ABNORMAL LOW (ref 6.5–8.1)

## 2024-02-10 LAB — SODIUM, URINE, RANDOM: Sodium, Ur: 45 mmol/L

## 2024-02-10 LAB — OSMOLALITY: Osmolality: 300 mosm/kg — ABNORMAL HIGH (ref 275–295)

## 2024-02-10 LAB — LACTATE DEHYDROGENASE: LDH: 112 U/L (ref 105–235)

## 2024-02-10 LAB — GLUCOSE, CAPILLARY
Glucose-Capillary: 106 mg/dL — ABNORMAL HIGH (ref 70–99)
Glucose-Capillary: 129 mg/dL — ABNORMAL HIGH (ref 70–99)
Glucose-Capillary: 134 mg/dL — ABNORMAL HIGH (ref 70–99)
Glucose-Capillary: 145 mg/dL — ABNORMAL HIGH (ref 70–99)
Glucose-Capillary: 164 mg/dL — ABNORMAL HIGH (ref 70–99)

## 2024-02-10 LAB — MAGNESIUM: Magnesium: 2.5 mg/dL — ABNORMAL HIGH (ref 1.7–2.4)

## 2024-02-10 LAB — CREATININE, URINE, RANDOM: Creatinine, Urine: 47 mg/dL

## 2024-02-10 LAB — FOLATE: Folate: >20 ng/mL (ref >5.9)

## 2024-02-10 LAB — VITAMIN B12: Vitamin B-12: 598 pg/mL (ref 180–914)

## 2024-02-10 LAB — HEMOGLOBIN A1C
Hgb A1c MFr Bld: 8.3 % — ABNORMAL HIGH (ref 4.8–5.6)
Mean Plasma Glucose: 192 mg/dL

## 2024-02-10 LAB — OSMOLALITY, URINE: Osmolality, Ur: 362 mosm/kg (ref 300–900)

## 2024-02-10 LAB — OCCULT BLOOD, POC DEVICE: Fecal Occult Bld: POSITIVE — AB

## 2024-02-10 LAB — PHOSPHORUS: Phosphorus: 3.4 mg/dL (ref 2.5–4.6)

## 2024-02-10 SURGERY — EGD (ESOPHAGOGASTRODUODENOSCOPY)
Anesthesia: Monitor Anesthesia Care

## 2024-02-10 MED ORDER — PROPOFOL 500 MG/50ML IV EMUL
INTRAVENOUS | Status: DC | PRN
Start: 2024-02-10 — End: 2024-02-10
  Administered 2024-02-10: 14 ug/kg/min via INTRAVENOUS

## 2024-02-10 MED ORDER — LIDOCAINE HCL 1 % IJ SOLN
INTRAMUSCULAR | Status: DC | PRN
  Administered 2024-02-10: 50 mg via INTRADERMAL

## 2024-02-10 MED ORDER — PANTOPRAZOLE SODIUM 40 MG PO TBEC: 40.0000 mg | DELAYED_RELEASE_TABLET | Freq: Every day | ORAL | 0 refills | Status: DC

## 2024-02-10 MED ORDER — IRON (FERROUS SULFATE) 325 (65 FE) MG PO TABS: 65.0000 mg | ORAL_TABLET | Freq: Every day | ORAL | 0 refills | Status: DC

## 2024-02-10 MED ORDER — PROPOFOL 10 MG/ML IV BOLUS
INTRAVENOUS | Status: DC | PRN
  Administered 2024-02-10 (×3): 20 mg via INTRAVENOUS

## 2024-02-10 MED ORDER — SODIUM CHLORIDE 0.9 % IV SOLN: INTRAVENOUS | Status: DC

## 2024-02-10 MED ORDER — ROSUVASTATIN CALCIUM 40 MG PO TABS: 40.0000 mg | ORAL_TABLET | Freq: Every day | ORAL | Status: AC

## 2024-02-10 MED ORDER — HYDROCODONE-ACETAMINOPHEN 5-325 MG PO TABS: 1.0000 | ORAL_TABLET | Freq: Four times a day (QID) | ORAL | 0 refills | Status: DC | PRN

## 2024-02-10 MED ORDER — EPINEPHRINE 0.3 MG/0.3ML IJ DEVI: 0.3000 mg | Freq: Once | INTRAMUSCULAR | Status: DC | PRN

## 2024-02-10 MED ORDER — PROPOFOL 1000 MG/100ML IV EMUL
INTRAVENOUS | Status: AC
  Filled 2024-02-10: qty 100

## 2024-02-10 NOTE — Anesthesia Preprocedure Evaluation (Addendum)
 Anesthesia Evaluation  Patient identified by MRN, date of birth, ID band Patient awake    Reviewed: Allergy & Precautions, H&P , NPO status , Patient's Chart, lab work & pertinent test results  Airway Mallampati: II  TM Distance: >3 FB Neck ROM: Full    Dental no notable dental hx. (+) Teeth Intact, Dental Advisory Given   Pulmonary neg pulmonary ROS, sleep apnea    Pulmonary exam normal breath sounds clear to auscultation       Cardiovascular Exercise Tolerance: Good hypertension, Pt. on medications and Pt. on home beta blockers +CHF  negative cardio ROS Normal cardiovascular exam Rhythm:Regular Rate:Normal  ECHO 5/25 1. Limited echo for pericardial effusion   2. Left ventricular ejection fraction, by estimation, is 60 to 65%. The  left ventricle has normal function. Left ventricular diastolic parameters  are consistent with Grade I diastolic dysfunction (impaired relaxation).   3. Effusion measures up to 1.89 cm over the RV free wall. Moderate  pericardial effusion. The pericardial effusion is circumferential. There  is no evidence of cardiac tamponade.   4. The inferior vena cava is normal in size with greater than 50%  respiratory variability, suggesting right atrial pressure of 3 mmHg.   Comparison(s): Changes from prior study are noted. 06/18/2023: LVEF 55-60%,  small circumferential pericardial effusion. Compared to the prior study,  the effusion appears a little larger anteriorly, however, tamponade is not  present.     Neuro/Psych negative neurological ROS  negative psych ROS   GI/Hepatic negative GI ROS, Neg liver ROS,,,  Endo/Other  negative endocrine ROSdiabetes, Type 2    Renal/GU Renal diseasenegative Renal ROS  negative genitourinary   Musculoskeletal negative musculoskeletal ROS (+)    Abdominal   Peds negative pediatric ROS (+)  Hematology negative hematology ROS (+) Blood dyscrasia, anemia    Anesthesia Other Findings   Reproductive/Obstetrics negative OB ROS                              Anesthesia Physical Anesthesia Plan  ASA: 3 and emergent  Anesthesia Plan: MAC   Post-op Pain Management: Minimal or no pain anticipated   Induction:   PONV Risk Score and Plan: 2 and Ondansetron  and Treatment may vary due to age or medical condition  Airway Management Planned: Natural Airway and Simple Face Mask  Additional Equipment: None  Intra-op Plan:   Post-operative Plan:   Informed Consent: I have reviewed the patients History and Physical, chart, labs and discussed the procedure including the risks, benefits and alternatives for the proposed anesthesia with the patient or authorized representative who has indicated his/her understanding and acceptance.       Plan Discussed with: Anesthesiologist and CRNA  Anesthesia Plan Comments:          Anesthesia Quick Evaluation

## 2024-02-10 NOTE — Consult Note (Addendum)
 [facility] CONSULT NOTE  Patient Care Team: [name], NP as PCP - General (Internal Medicine) [name], MD as PCP - Cardiology (Cardiology) [name], MD as Consulting Physician (Gastroenterology)  CHIEF COMPLAINTS/PURPOSE OF CONSULTATION:  Pancreatic mass   REFERRING PHYSICIAN: Dr. Silvester  HISTORY OF PRESENTING ILLNESS:  [name] [name] 73 y.o. male who was admitted 02/09/24 with complaints of melena. Imaging was done which showed pancreatic mass. Therefore oncology evaluation has been requested. Patient is seen today, awake alert and oriented x4 laying in bed. Spouse at bedside.  Patient is good historian.  Reports that he began to note dark stools about 5 days prior to admission. Went to see his primary physician who sent him for further evaluation to the ED. Patient admits to being on Naprosyn for back pain since March, however stopped it in early October. Also has been on Eliquis  for afib.  States he was in his usual state of health prior to this, performing regular ADLs in his yard and decorating for Christmas.  He did notice fatigue during the 5 days of melena.  Denies any abdominal or other pain. Denies other acute GI symptoms including nausea, vomiting.   Medical history is inclusive of afib on Eliquis , CAD, diabetes, SVT, pericardial effusion, hypertension, and hyperlipidemia. Surgical history includes parathyroidectomy and skin cancer removal.  Family history includes a maternal aunt with an unknown cancer. Social history is noncontributory, denies tobacco use, denies recreational or illicit drug use.  Admits to beers a couple per day.  Denies occupational hazardous materials exposure, he is retired and worked with computers.     I have reviewed his chart and materials related to his cancer extensively and collaborated history with the patient. Summary of oncologic history is as follows: Oncology History   No history exists.     ASSESSMENT & PLAN:  Pancreatic mass Concern for pancreatic adenocarcinoma - Imaging done 02/09/2024 showed mass from tail of pancreas with likely invasion of anterior spleen measuring 5.6 x 5.4 cm, suspicious for pancreatic adenocarcinoma. - Ordered CT chest for staging - Tumor markers ordered, will follow results - Recommend IR eval for biopsy. - Medical oncology/[name] will make further evaluation recommendations.  Melena GI bleed - Patient reports dark stool x 5 days.  Patient reports frequent use of Naprosyn from March through early October due to back pain. - Seen by GI/Dr. Dianna.  EGD done today 02/10/2024, no ulcers seen.  Biopsies taken, path pending, will follow results. - GI following closely  Anemia Thrombocytopenia - Hemoglobin low 8.5 - Baseline appears to be in the 12-15 range - Recommend PRBC transfusion for hemoglobin <7.0 - Platelets decreasing 99K.  No intervention required at this time.  Monitor closely - Continue to monitor CBC with differential  History of A-fib - Patient has been on Eliquis .  Currently on hold - Management per medicine  Hypertension Hyperlipidemia Diabetes - Continue to monitor blood pressure closely - Continue to monitor blood glucose levels - Management per medicine       MEDICAL HISTORY:  Past Medical History:  Diagnosis Date   Atrial fibrillation (HCC)    Dyslipidemia    ED (erectile dysfunction)    Hyperlipidemia    Hyperparathyroidism    Hypertension    Hypogonadism, male    Sleep apnea     SURGICAL HISTORY: Past Surgical History:  Procedure Laterality Date   PARATHYROIDECTOMY     SKIN BIOPSY Left 03/02/2019   squamous cell carcinoma in situ,  hypertrophic, traumatized   SKIN BIOPSY Right 08/01/2021   residual squamous cell caarcinoma    SOCIAL HISTORY: Social History   Socioeconomic History   Marital status: Married    Spouse name: Not on file   Number of children: 2   Years of education: Not  on file   Highest education level: Master's degree (e.g., MA, MS, MEng, MEd, MSW, MBA)  Occupational History   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[employer]  Tobacco Use   Smoking status: Never   Smokeless tobacco: Never  Vaping Use   Vaping status: Never Used  Substance and Sexual Activity   Alcohol use: Yes    Alcohol/week: 14.0 standard drinks of alcohol    Types: 14 Cans of beer per week   Drug use: No   Sexual activity: Yes  Other Topics Concern   Not on file  Social History Narrative   Not on file   Social Drivers of Health   Financial Resource Strain: Low Risk  (02/09/2024)   Overall Financial Resource Strain (CARDIA)    Difficulty of Paying Living Expenses: Not very hard  Food Insecurity: No Food Insecurity (02/09/2024)   Hunger Vital Sign    Worried About Running Out of Food in the Last Year: Never true    Ran Out of Food in the Last Year: Never true  Transportation Needs: No Transportation Needs (02/09/2024)   PRAPARE - Administrator, Civil Service (Medical): No    Lack of Transportation (Non-Medical): No  Physical Activity: Sufficiently Active (02/09/2024)   Exercise Vital Sign    Days of Exercise per Week: 3 days    Minutes of Exercise per Session: 50 min  Stress: No Stress Concern Present (02/09/2024)   [employer] of Occupational Health - Occupational Stress Questionnaire    Feeling of Stress: Only a little  Social Connections: Moderately Integrated (02/10/2024)   Social Connection and Isolation Panel    Frequency of Communication with Friends and Family: Three times a week    Frequency of Social Gatherings with Friends and Family: Three times a week    Attends Religious Services: More than 4 times per year    Active Member of Clubs or Organizations: No    Attends Banker Meetings: Never    Marital Status: Married  Catering Manager Violence: Not At Risk (02/09/2024)   Humiliation, Afraid, Rape, and Kick questionnaire    Fear of  Current or Ex-Partner: No    Emotionally Abused: No    Physically Abused: No    Sexually Abused: No    FAMILY HISTORY: Family History  Problem Relation Age of Onset   Hypertension Father    Hyperlipidemia Father      PHYSICAL EXAMINATION: ECOG PERFORMANCE STATUS: 1 - Symptomatic but completely ambulatory  Vitals:   02/10/24 0415 02/10/24 0746  BP: 117/62 123/62  Pulse: (!) 57 65  Resp: 16 16  Temp: 98.5 F (36.9 C) 98 F (36.7 C)  SpO2: 98% 99%   Filed Weights   02/10/24 0328  Weight: 217 lb 9.5 oz (98.7 kg)    GENERAL: alert, no distress and comfortable SKIN: skin color, texture, turgor are normal, no rashes or significant lesions EYES: normal, conjunctiva are pink and non-injected, sclera clear OROPHARYNX: no exudate, no erythema and lips, buccal mucosa, and tongue normal  NECK: supple, thyroid normal size, non-tender, without nodularity LYMPH: no palpable lymphadenopathy in the cervical, axillary or inguinal LUNGS: clear to auscultation and percussion with normal breathing effort HEART:  regular rate & rhythm and no murmurs and no lower extremity edema ABDOMEN: abdomen soft, non-tender and normal bowel sounds MUSCULOSKELETAL: no cyanosis of digits and no clubbing  PSYCH: alert & oriented x 3 with fluent speech NEURO: no focal motor/sensory deficits   ALLERGIES:  is allergic to yellow jacket venom.  MEDICATIONS:  Current Facility-Administered Medications  Medication Dose Route Frequency Provider Last Rate Last Admin   acetaminophen  (TYLENOL ) tablet 650 mg  650 mg Oral Q6H PRN Doutova, Anastassia, MD       Or   acetaminophen  (TYLENOL ) suppository 650 mg  650 mg Rectal Q6H PRN Doutova, Anastassia, MD       fentaNYL  (SUBLIMAZE ) injection 12.5-50 mcg  12.5-50 mcg Intravenous Q2H PRN Doutova, Anastassia, MD       gabapentin  (NEURONTIN ) capsule 300 mg  300 mg Oral QHS Doutova, Anastassia, MD       HYDROcodone -acetaminophen  (NORCO/VICODIN) 5-325 MG per tablet 1-2  tablet  1-2 tablet Oral Q4H PRN Doutova, Anastassia, MD       insulin  aspart (novoLOG ) injection 0-9 Units  0-9 Units Subcutaneous Q4H Doutova, Anastassia, MD   1 Units at 02/10/24 0437   ondansetron  (ZOFRAN ) tablet 4 mg  4 mg Oral Q6H PRN Doutova, Anastassia, MD       Or   ondansetron  (ZOFRAN ) injection 4 mg  4 mg Intravenous Q6H PRN Doutova, Anastassia, MD       pantoprazole  (PROTONIX ) injection 40 mg  40 mg Intravenous Q12H Doutova, Anastassia, MD       rosuvastatin  (CRESTOR ) tablet 40 mg  40 mg Oral QHS Silvester Ales, MD   40 mg at 02/09/24 2103     LABORATORY DATA:  I have reviewed the data as listed Lab Results  Component Value Date   WBC 4.1 02/10/2024   HGB 8.5 (L) 02/10/2024   HCT 26.5 (L) 02/10/2024   MCV 95.7 02/10/2024   PLT 99 (L) 02/10/2024   Recent Labs    05/13/23 1010 02/09/24 1430 02/10/24 0215  NA 138 136 138  K 4.2 4.6 3.8  CL 101 101 105  CO2 29 23 22   GLUCOSE 205* 191* 136*  BUN 21 44* 37*  CREATININE 1.03 1.81* 1.60*  CALCIUM  9.6 10.1 9.1  GFRNONAA  --  39* 45*  PROT  --  7.1 5.7*  ALBUMIN  --  4.4 3.7  AST  --  28 20  ALT  --  22 17  ALKPHOS  --  47 39  BILITOT  --  1.1 0.9    RADIOGRAPHIC STUDIES: I have personally reviewed the radiological images as listed and agreed with the findings in the report.  CT ANGIO GI BLEED:  IMPRESSION: 1. No active GI bleeding. 2. Heterogeneously hypoenhancing mass arising from the tail of the pancreas with likely invasion of the anterior spleen and loss of the fat plane to the gastric greater curvature, measuring about 5.6 x 5.4 cm, likely pancreatic adenocarcinoma; focal pancreatitis with pseudocyst is a less likely consideration; new from prior study. 3. Focal high-grade stenosis of the left internal iliac artery with approximately 80 percent diameter reduction. 4. Cholelithiasis with multiple small stones layering in the gallbladder without inflammatory stranding. 5. Small periumbilical hernia  containing fat with increased density suggesting fat necrosis, without bowel herniation.     The total time spent in the appointment was 55 minutes encounter with patients including review of chart and various tests results, discussions about plan of care and coordination of care plan   All  questions were answered. The patient knows to call the clinic with any problems, questions or concerns. No barriers to learning was detected.  Olam PARAS Rouson, NP 11/26/20259:40 AM   Addendum I have seen the patient, examined him. I agree with the assessment and and plan and have edited the notes.   73 year old gentleman with past medical history of atrial fibrillation on Eliquis , coronary artery disease, type 2 diabetes, sleep apnea, was admitted for upper GI bleeding.  CTA was done for source of bleeding and incidentally found a mass in the pancreatic tail.  Patient has chronic back pain but does report much worsening back pain which could be related to the pancreatic mass.  No evidence of distant metastasis on the CT of abdomen and pelvis.  Patient underwent a EGD this morning, no clear source of bleeding.  IR does not think they can perform percutaneous biopsy of the pancreatic mass, I recommend EUS biopsy, Dr. Burnette will arrange that as soon as possible.  Okay to discharge today from oncology standpoint, patient would like to have Thanksgiving at home tomorrow.  I will schedule a lab appointment to check his blood counts in my office next week, and plan to follow-up after his pancreatic biopsy.  All questions were answered.  Onita Mattock MD 02/10/2024

## 2024-02-10 NOTE — Plan of Care (Signed)
  Problem: Education: Goal: Ability to describe self-care measures that may prevent or decrease complications (Diabetes Survival Skills Education) will improve Outcome: Adequate for Discharge Goal: Individualized Educational Video(s) Outcome: Adequate for Discharge  Pt ready for discharge instructions reviewed with pt and wife

## 2024-02-10 NOTE — H&P (View-Only) (Signed)
 Referring Provider: Dr. Cheryle Primary Care Physician:  [name], NP Primary Gastroenterologist:  Sampson  Reason for Consultation:  Melena  HPI: [name] [name] is a 73 y.o. male multiple medical problems as stated below on Eliquis  for atrial fibrillation last dose 2 days ago.  Had been on Naprosyn twice a day for 6 months that he reports stopping in October.  Has been having black stools several times per day for the past week.  Denies abdominal pain, nausea or vomiting.  Lightheadedness prior to coming to the hospital.  EGD in 2024 showed gastritis. Colonoscopy in 08/2022 showed diverticulosis, hemorrhoids and 1 adenomatous polyp was removed.  Hemoglobin 8.5.  CT angiogram negative for active GI bleeding.  A hypoenhancing pancreatic tail mass noted.  Past Medical History:  Diagnosis Date   Atrial fibrillation (HCC)    Dyslipidemia    ED (erectile dysfunction)    Hyperlipidemia    Hyperparathyroidism    Hypertension    Hypogonadism, male    Sleep apnea     Past Surgical History:  Procedure Laterality Date   PARATHYROIDECTOMY     SKIN BIOPSY Left 03/02/2019   squamous cell carcinoma in situ, hypertrophic, traumatized   SKIN BIOPSY Right 08/01/2021   residual squamous cell caarcinoma    Prior to Admission medications   Medication Sig Start Date End Date Taking? Authorizing Provider  amLODipine  (NORVASC ) 10 MG tablet Take 1 tablet (10 mg total) by mouth daily. for blood pressure. 09/28/23  Yes Clark, Katherine K, NP  dapagliflozin  propanediol (FARXIGA ) 10 MG TABS tablet TAKE 1 TABLET (10 MG TOTAL) BY MOUTH DAILY BEFORE BREAKFAST. FOR DIABETES. Patient taking differently: Take 10 mg by mouth in the morning. 12/04/23  Yes [name], NP  EPINEPHrine  (EPI-PEN) 0.3 mg/0.3 mL DEVI Inject 0.3 mLs (0.3 mg total) into the muscle once. Patient taking differently: Inject 0.3 mg into the muscle once as needed (for anaphylaxis). 03/14/14  Yes Joyce Norleen BROCKS, MD  gabapentin   (NEURONTIN ) 300 MG capsule Take 300 mg by mouth in the morning and at bedtime. 06/19/23  Yes [provider]  Glucosamine-Chondroit-Vit C-Mn (GLUCOSAMINE 1500 COMPLEX) CAPS Take 1 capsule by mouth daily.    Yes [provider]  metoprolol  succinate (TOPROL -XL) 25 MG 24 hr tablet TAKE 1 TABLET BY MOUTH EVERY DAY FOR BLOOD PRESSURE Patient taking differently: Take 25 mg by mouth in the morning. 12/13/23  Yes Clark, Katherine K, NP  Multiple Vitamins-Minerals (MULTIVITAMIN WITH MINERALS) tablet Take 1 tablet by mouth daily with breakfast.   Yes [provider]  PRESCRIPTION MEDICATION CPAP- At bedtime   Yes [provider]  rosuvastatin  (CRESTOR ) 40 MG tablet TAKE 1 TABLET BY MOUTH EVERY DAY Patient taking differently: Take 40 mg by mouth at bedtime. 09/22/23  Yes Joyce Norleen BROCKS, MD  spironolactone  (ALDACTONE ) 50 MG tablet TAKE 1 TABLET (50 MG TOTAL) BY MOUTH DAILY. FOR BLOOD PRESSURE. Patient taking differently: Take 50 mg by mouth in the morning. 11/02/23  Yes Clark, Katherine K, NP  tadalafil  (CIALIS ) 20 MG tablet Take 1 tablet (20 mg total) by mouth daily as needed for erectile dysfunction. 02/04/23  Yes Joyce Norleen BROCKS, MD  Testosterone  20.25 MG/ACT (1.62%) GEL APPLY 2 PUMPS TO THE SHOULDER AND ARM DAILY Patient taking differently: Apply 2 Pump topically See admin instructions. APPLY 40.50 mg (2 PUMPS) TO A SHOULDER AND ARM DAILY- after the morning shower 01/22/24  Yes [name], NP  valsartan  (DIOVAN ) 320 MG tablet TAKE 1 TABLET (  320 MG TOTAL) BY MOUTH DAILY. FOR BLOOD PRESSURE. 12/04/23 12/03/24 Yes [name], NP  Accu-Chek FastClix Lancets MISC 1 each by Does not apply route daily. 10/16/20   Joyce Norleen BROCKS, MD  apixaban  (ELIQUIS ) 5 MG TABS tablet Take 1 tablet (5 mg total) by mouth 2 (two) times daily. Patient not taking: Reported on 02/09/2024 05/14/23   [name], MD  glucose blood (ACCU-CHEK GUIDE) test strip Use one daily to check  blood sugar 10/16/20   Joyce Norleen BROCKS, MD  Iron , Ferrous Sulfate , 325 (65 Fe) MG TABS Take 65 mg by mouth daily with breakfast. Patient not taking: Reported on 02/09/2024 08/09/22 02/09/24  [place], Eric J, DO    Scheduled Meds:  [facility] Hold] gabapentin   300 mg Oral QHS   [MAR Hold] insulin  aspart  0-9 Units Subcutaneous Q4H   [MAR Hold] pantoprazole  (PROTONIX ) IV  40 mg Intravenous Q12H   [MAR Hold] rosuvastatin   40 mg Oral QHS   Continuous Infusions:  sodium chloride      PRN Meds:.[MAR Hold] acetaminophen  **OR** [MAR Hold] acetaminophen , [MAR Hold] fentaNYL  (SUBLIMAZE ) injection, [MAR Hold] HYDROcodone -acetaminophen , [MAR Hold] ondansetron  **OR** [MAR Hold] ondansetron  (ZOFRAN ) IV  Allergies as of 02/09/2024 - Review Complete 02/09/2024  Allergen Reaction Noted   Yellow jacket venom Anaphylaxis 03/29/2015    Family History  Problem Relation Age of Onset   Hypertension Father    Hyperlipidemia Father     Social History   Socioeconomic History   Marital status: Married    Spouse name: Not on file   Number of children: 2   Years of education: Not on file   Highest education level: Master's degree (e.g., MA, MS, MEng, MEd, MSW, MBA)  Occupational History   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[employer]  Tobacco Use   Smoking status: Never   Smokeless tobacco: Never  Vaping Use   Vaping status: Never Used  Substance and Sexual Activity   Alcohol use: Yes    Alcohol/week: 14.0 standard drinks of alcohol    Types: 14 Cans of beer per week   Drug use: No   Sexual activity: Yes  Other Topics Concern   Not on file  Social History Narrative   Not on file   Social Drivers of Health   Financial Resource Strain: Low Risk  (02/09/2024)   Overall Financial Resource Strain (CARDIA)    Difficulty of Paying Living Expenses: Not very hard  Food Insecurity: No Food Insecurity (02/09/2024)   Hunger Vital Sign    Worried About Running Out of Food in the Last Year: Never true    Ran Out of  Food in the Last Year: Never true  Transportation Needs: No Transportation Needs (02/09/2024)   PRAPARE - Administrator, Civil Service (Medical): No    Lack of Transportation (Non-Medical): No  Physical Activity: Sufficiently Active (02/09/2024)   Exercise Vital Sign    Days of Exercise per Week: 3 days    Minutes of Exercise per Session: 50 min  Stress: No Stress Concern Present (02/09/2024)   [employer] of Occupational Health - Occupational Stress Questionnaire    Feeling of Stress: Only a little  Social Connections: Moderately Integrated (02/10/2024)   Social Connection and Isolation Panel    Frequency of Communication with Friends and Family: Three times a week    Frequency of Social Gatherings with Friends and Family: Three times a week    Attends Religious Services: More than 4 times per year  Active Member of Clubs or Organizations: No    Attends Banker Meetings: Never    Marital Status: Married  Catering Manager Violence: Not At Risk (02/09/2024)   Humiliation, Afraid, Rape, and Kick questionnaire    Fear of Current or Ex-Partner: No    Emotionally Abused: No    Physically Abused: No    Sexually Abused: No    Review of Systems: All negative except as stated above in HPI.  Physical Exam: Vital signs: Vitals:   02/10/24 0746 02/10/24 1158  BP: 123/62 (!) 149/60  Pulse: 65 74  Resp: 16 14  Temp: 98 F (36.7 C) 98 F (36.7 C)  SpO2: 99% 99%   Last BM Date : 02/08/24 (per patient) General:   Lethargic, elderly, well-nourished, pleasant, no acute distress  Head: normocephalic, atraumatic Eyes: anicteric sclera ENT: oropharynx clear Neck: supple, nontender Lungs:  Clear throughout to auscultation.   No wheezes, crackles, or rhonchi. No acute distress. Heart:  Regular rate and rhythm; no murmurs, clicks, rubs,  or gallops. Abdomen: Soft nontender nondistended positive bowel sounds Rectal:  Deferred Ext: no edema  GI:  Lab  Results: Recent Labs    02/09/24 2055 02/10/24 0215 02/10/24 0717  WBC 4.3 4.0 4.1  HGB 8.5* 7.8* 8.5*  HCT 27.1* 24.3* 26.5*  PLT 104* 98* 99*   BMET Recent Labs    02/09/24 1430 02/10/24 0215  NA 136 138  K 4.6 3.8  CL 101 105  CO2 23 22  GLUCOSE 191* 136*  BUN 44* 37*  CREATININE 1.81* 1.60*  CALCIUM  10.1 9.1   LFT Recent Labs    02/10/24 0215  PROT 5.7*  ALBUMIN 3.7  AST 20  ALT 17  ALKPHOS 39  BILITOT 0.9   PT/INR Recent Labs    02/09/24 1430  LABPROT 14.1  INR 1.0        Impression/Plan: Melenic stools in the setting of recent Eliquis  use and chronic NSAID abuse.  He reports stopping NSAIDs in October.  Pancreatic tail mass seen on CT angiogram.  EGD to evaluate for peptic ulcer disease although he could have gastric involvement from pancreatic mass.  Will defer to Dr. Burnette feasibility of EUS.      LOS: 1 day   [name] [name]  02/10/2024, 12:19 PM  Questions please call [phone]

## 2024-02-10 NOTE — Care Management Obs Status (Signed)
 MEDICARE OBSERVATION STATUS NOTIFICATION   Patient Details  Name: HARI CASAUS MRN: [id number] Date of Birth: [birth year]   Medicare Observation Status Notification Given:  Yes    MahabirNathanel, RN 02/10/2024, 2:40 PM

## 2024-02-10 NOTE — Interval H&P Note (Signed)
 History and Physical Interval Note:  02/10/2024 12:28 PM  [name] [name]  has presented today for surgery, with the diagnosis of Melena.  The various methods of treatment have been discussed with the patient and family. After consideration of risks, benefits and other options for treatment, the patient has consented to  Procedure(s): EGD (ESOPHAGOGASTRODUODENOSCOPY) (N/A) as a surgical intervention.  The patient's history has been reviewed, patient examined, no change in status, stable for surgery.  I have reviewed the patient's chart and labs.  Questions were answered to the patient's satisfaction.     [name] [name]

## 2024-02-10 NOTE — H&P (View-Only) (Signed)
 Referring Provider: Dr. Cheryle Primary Care Physician:  [name], NP Primary Gastroenterologist:  Sampson  Reason for Consultation:  Melena  HPI: [name] [name] is a 73 y.o. male multiple medical problems as stated below on Eliquis  for atrial fibrillation last dose 2 days ago.  Had been on Naprosyn twice a day for 6 months that he reports stopping in October.  Has been having black stools several times per day for the past week.  Denies abdominal pain, nausea or vomiting.  Lightheadedness prior to coming to the hospital.  EGD in 2024 showed gastritis. Colonoscopy in 08/2022 showed diverticulosis, hemorrhoids and 1 adenomatous polyp was removed.  Hemoglobin 8.5.  CT angiogram negative for active GI bleeding.  A hypoenhancing pancreatic tail mass noted.  Past Medical History:  Diagnosis Date   Atrial fibrillation (HCC)    Dyslipidemia    ED (erectile dysfunction)    Hyperlipidemia    Hyperparathyroidism    Hypertension    Hypogonadism, male    Sleep apnea     Past Surgical History:  Procedure Laterality Date   PARATHYROIDECTOMY     SKIN BIOPSY Left 03/02/2019   squamous cell carcinoma in situ, hypertrophic, traumatized   SKIN BIOPSY Right 08/01/2021   residual squamous cell caarcinoma    Prior to Admission medications   Medication Sig Start Date End Date Taking? Authorizing Provider  amLODipine  (NORVASC ) 10 MG tablet Take 1 tablet (10 mg total) by mouth daily. for blood pressure. 09/28/23  Yes Clark, Katherine K, NP  dapagliflozin  propanediol (FARXIGA ) 10 MG TABS tablet TAKE 1 TABLET (10 MG TOTAL) BY MOUTH DAILY BEFORE BREAKFAST. FOR DIABETES. Patient taking differently: Take 10 mg by mouth in the morning. 12/04/23  Yes [name], NP  EPINEPHrine  (EPI-PEN) 0.3 mg/0.3 mL DEVI Inject 0.3 mLs (0.3 mg total) into the muscle once. Patient taking differently: Inject 0.3 mg into the muscle once as needed (for anaphylaxis). 03/14/14  Yes Joyce Norleen BROCKS, MD  gabapentin   (NEURONTIN ) 300 MG capsule Take 300 mg by mouth in the morning and at bedtime. 06/19/23  Yes [provider]  Glucosamine-Chondroit-Vit C-Mn (GLUCOSAMINE 1500 COMPLEX) CAPS Take 1 capsule by mouth daily.    Yes [provider]  metoprolol  succinate (TOPROL -XL) 25 MG 24 hr tablet TAKE 1 TABLET BY MOUTH EVERY DAY FOR BLOOD PRESSURE Patient taking differently: Take 25 mg by mouth in the morning. 12/13/23  Yes Clark, Katherine K, NP  Multiple Vitamins-Minerals (MULTIVITAMIN WITH MINERALS) tablet Take 1 tablet by mouth daily with breakfast.   Yes [provider]  PRESCRIPTION MEDICATION CPAP- At bedtime   Yes [provider]  rosuvastatin  (CRESTOR ) 40 MG tablet TAKE 1 TABLET BY MOUTH EVERY DAY Patient taking differently: Take 40 mg by mouth at bedtime. 09/22/23  Yes Joyce Norleen BROCKS, MD  spironolactone  (ALDACTONE ) 50 MG tablet TAKE 1 TABLET (50 MG TOTAL) BY MOUTH DAILY. FOR BLOOD PRESSURE. Patient taking differently: Take 50 mg by mouth in the morning. 11/02/23  Yes Clark, Katherine K, NP  tadalafil  (CIALIS ) 20 MG tablet Take 1 tablet (20 mg total) by mouth daily as needed for erectile dysfunction. 02/04/23  Yes Joyce Norleen BROCKS, MD  Testosterone  20.25 MG/ACT (1.62%) GEL APPLY 2 PUMPS TO THE SHOULDER AND ARM DAILY Patient taking differently: Apply 2 Pump topically See admin instructions. APPLY 40.50 mg (2 PUMPS) TO A SHOULDER AND ARM DAILY- after the morning shower 01/22/24  Yes [name], NP  valsartan  (DIOVAN ) 320 MG tablet TAKE 1 TABLET (  320 MG TOTAL) BY MOUTH DAILY. FOR BLOOD PRESSURE. 12/04/23 12/03/24 Yes [name], NP  Accu-Chek FastClix Lancets MISC 1 each by Does not apply route daily. 10/16/20   Joyce Norleen BROCKS, MD  apixaban  (ELIQUIS ) 5 MG TABS tablet Take 1 tablet (5 mg total) by mouth 2 (two) times daily. Patient not taking: Reported on 02/09/2024 05/14/23   [name], MD  glucose blood (ACCU-CHEK GUIDE) test strip Use one daily to check  blood sugar 10/16/20   Joyce Norleen BROCKS, MD  Iron , Ferrous Sulfate , 325 (65 Fe) MG TABS Take 65 mg by mouth daily with breakfast. Patient not taking: Reported on 02/09/2024 08/09/22 02/09/24  [place], Eric J, DO    Scheduled Meds:  [facility] Hold] gabapentin   300 mg Oral QHS   [MAR Hold] insulin  aspart  0-9 Units Subcutaneous Q4H   [MAR Hold] pantoprazole  (PROTONIX ) IV  40 mg Intravenous Q12H   [MAR Hold] rosuvastatin   40 mg Oral QHS   Continuous Infusions:  sodium chloride      PRN Meds:.[MAR Hold] acetaminophen  **OR** [MAR Hold] acetaminophen , [MAR Hold] fentaNYL  (SUBLIMAZE ) injection, [MAR Hold] HYDROcodone -acetaminophen , [MAR Hold] ondansetron  **OR** [MAR Hold] ondansetron  (ZOFRAN ) IV  Allergies as of 02/09/2024 - Review Complete 02/09/2024  Allergen Reaction Noted   Yellow jacket venom Anaphylaxis 03/29/2015    Family History  Problem Relation Age of Onset   Hypertension Father    Hyperlipidemia Father     Social History   Socioeconomic History   Marital status: Married    Spouse name: Not on file   Number of children: 2   Years of education: Not on file   Highest education level: Master's degree (e.g., MA, MS, MEng, MEd, MSW, MBA)  Occupational History   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[employer]  Tobacco Use   Smoking status: Never   Smokeless tobacco: Never  Vaping Use   Vaping status: Never Used  Substance and Sexual Activity   Alcohol use: Yes    Alcohol/week: 14.0 standard drinks of alcohol    Types: 14 Cans of beer per week   Drug use: No   Sexual activity: Yes  Other Topics Concern   Not on file  Social History Narrative   Not on file   Social Drivers of Health   Financial Resource Strain: Low Risk  (02/09/2024)   Overall Financial Resource Strain (CARDIA)    Difficulty of Paying Living Expenses: Not very hard  Food Insecurity: No Food Insecurity (02/09/2024)   Hunger Vital Sign    Worried About Running Out of Food in the Last Year: Never true    Ran Out of  Food in the Last Year: Never true  Transportation Needs: No Transportation Needs (02/09/2024)   PRAPARE - Administrator, Civil Service (Medical): No    Lack of Transportation (Non-Medical): No  Physical Activity: Sufficiently Active (02/09/2024)   Exercise Vital Sign    Days of Exercise per Week: 3 days    Minutes of Exercise per Session: 50 min  Stress: No Stress Concern Present (02/09/2024)   [employer] of Occupational Health - Occupational Stress Questionnaire    Feeling of Stress: Only a little  Social Connections: Moderately Integrated (02/10/2024)   Social Connection and Isolation Panel    Frequency of Communication with Friends and Family: Three times a week    Frequency of Social Gatherings with Friends and Family: Three times a week    Attends Religious Services: More than 4 times per year  Active Member of Clubs or Organizations: No    Attends Banker Meetings: Never    Marital Status: Married  Catering Manager Violence: Not At Risk (02/09/2024)   Humiliation, Afraid, Rape, and Kick questionnaire    Fear of Current or Ex-Partner: No    Emotionally Abused: No    Physically Abused: No    Sexually Abused: No    Review of Systems: All negative except as stated above in HPI.  Physical Exam: Vital signs: Vitals:   02/10/24 0746 02/10/24 1158  BP: 123/62 (!) 149/60  Pulse: 65 74  Resp: 16 14  Temp: 98 F (36.7 C) 98 F (36.7 C)  SpO2: 99% 99%   Last BM Date : 02/08/24 (per patient) General:   Lethargic, elderly, well-nourished, pleasant, no acute distress  Head: normocephalic, atraumatic Eyes: anicteric sclera ENT: oropharynx clear Neck: supple, nontender Lungs:  Clear throughout to auscultation.   No wheezes, crackles, or rhonchi. No acute distress. Heart:  Regular rate and rhythm; no murmurs, clicks, rubs,  or gallops. Abdomen: Soft nontender nondistended positive bowel sounds Rectal:  Deferred Ext: no edema  GI:  Lab  Results: Recent Labs    02/09/24 2055 02/10/24 0215 02/10/24 0717  WBC 4.3 4.0 4.1  HGB 8.5* 7.8* 8.5*  HCT 27.1* 24.3* 26.5*  PLT 104* 98* 99*   BMET Recent Labs    02/09/24 1430 02/10/24 0215  NA 136 138  K 4.6 3.8  CL 101 105  CO2 23 22  GLUCOSE 191* 136*  BUN 44* 37*  CREATININE 1.81* 1.60*  CALCIUM  10.1 9.1   LFT Recent Labs    02/10/24 0215  PROT 5.7*  ALBUMIN 3.7  AST 20  ALT 17  ALKPHOS 39  BILITOT 0.9   PT/INR Recent Labs    02/09/24 1430  LABPROT 14.1  INR 1.0        Impression/Plan: Melenic stools in the setting of recent Eliquis  use and chronic NSAID abuse.  He reports stopping NSAIDs in October.  Pancreatic tail mass seen on CT angiogram.  EGD to evaluate for peptic ulcer disease although he could have gastric involvement from pancreatic mass.  Will defer to Dr. Burnette feasibility of EUS.      LOS: 1 day   Jerrell JAYSON Sol  02/10/2024, 12:19 PM  Questions please call [phone]

## 2024-02-10 NOTE — Op Note (Signed)
 [facility] Patient Name: [name] [name] Procedure Date: 02/10/2024 MRN: [id number] Attending MD: Jerrell JAYSON Sol , MD, [id number] Date of Birth: [birth year] CSN: [id number] Age: 73 Admit Type: Inpatient Procedure:                Upper GI endoscopy Indications:              Melena Providers:                Jerrell KYM Sol, MD, Almarie Masters, RN, Felice Sar, Technician Referring MD:             hospital team Medicines:                Propofol  per Anesthesia, Monitored Anesthesia Care Complications:            No immediate complications. Estimated Blood Loss:     Estimated blood loss was minimal. Procedure:                Pre-Anesthesia Assessment:                           - Prior to the procedure, a History and Physical                            was performed, and patient medications and                            allergies were reviewed. The patient's tolerance of                            previous anesthesia was also reviewed. The risks                            and benefits of the procedure and the sedation                            options and risks were discussed with the patient.                            All questions were answered, and informed consent                            was obtained. Prior Anticoagulants: The patient has                            taken Eliquis  (apixaban ), last dose was stopped at                            admission. ASA Grade Assessment: III - A patient                            with severe systemic disease. After reviewing the  risks and benefits, the patient was deemed in                            satisfactory condition to undergo the procedure.                           After obtaining informed consent, the endoscope was                            passed under direct vision. Throughout the                            procedure, the patient's blood pressure, pulse, and                             oxygen saturations were monitored continuously. The                            [id number]-[id number] ([id number]) Olympus endoscope was introduced                            through the mouth, and advanced to the second part                            of duodenum. The upper GI endoscopy was                            accomplished without difficulty. The patient                            tolerated the procedure well. Scope In: Scope Out: Findings:      LA Grade B (one or more mucosal breaks greater than 5 mm, not extending       between the tops of two mucosal folds) esophagitis with no bleeding was       found in the distal esophagus.      The Z-line was found 40 cm from the incisors.      Segmental moderate mucosal changes characterized by congestion, erythema       and nodularity were found at the gastroesophageal junction. Biopsies       were taken with a cold forceps for histology. Estimated blood loss was       minimal.      Segmental mild inflammation characterized by congestion (edema) and       linear erosions was found in the gastric antrum. Biopsies were taken       with a cold forceps for histology. Estimated blood loss was minimal.      A small hiatal hernia was present.      Segmental moderate mucosal changes characterized by congestion, erythema       and erosion were found in the duodenal bulb. Biopsies were taken with a       cold forceps for histology. Estimated blood loss was minimal.      The exam of the duodenum was otherwise normal.      A non-obstructing Schatzki ring was found at the gastroesophageal       junction.  Impression:               - LA Grade B reflux esophagitis with no bleeding.                           - Z-line, 40 cm from the incisors.                           - Congested, erythematous and nodular mucosa in the                            gastroesophageal junction. Biopsied.                           - Acute gastritis. Biopsied.                            - Small hiatal hernia.                           - Mucosal changes in the duodenum. Biopsied.                           - Non-obstructing Schatzki ring. Moderate Sedation:      N/A - MAC procedure Recommendation:           - Advance diet as tolerated and full liquid diet.                           - Await pathology results.                           - Continue to hold Eliquis  for another 2-3 days. Procedure Code(s):        --- Professional ---                           [phone], Esophagogastroduodenoscopy, flexible,                            transoral; with biopsy, single or multiple Diagnosis Code(s):        --- Professional ---                           K92.1, Melena (includes Hematochezia)                           K21.00, Gastro-esophageal reflux disease with                            esophagitis, without bleeding                           K29.00, Acute gastritis without bleeding                           K22.2, Esophageal obstruction  K31.89, Other diseases of stomach and duodenum                           K44.9, Diaphragmatic hernia without obstruction or                            gangrene CPT copyright 2022 American Medical Association. All rights reserved. The codes documented in this report are preliminary and upon coder review may  be revised to meet current compliance requirements. Jerrell JAYSON Sol, MD 02/10/2024 12:57:54 PM This report has been signed electronically. Number of Addenda: 0

## 2024-02-10 NOTE — Care Management Obs Status (Signed)
 MEDICARE OBSERVATION STATUS NOTIFICATION   Patient Details  Name: [name] [name] MRN: [id number] Date of Birth: [birth year]   Medicare Observation Status Notification Given:  Yes    MahabirNathanel, RN 02/10/2024, 2:40 PM

## 2024-02-10 NOTE — Consult Note (Signed)
 Referring Provider: Dr. Cheryle Primary Care Physician:  [name], NP Primary Gastroenterologist:  Sampson  Reason for Consultation:  Melena  HPI: [name] [name] is a 73 y.o. male multiple medical problems as stated below on Eliquis  for atrial fibrillation last dose 2 days ago.  Had been on Naprosyn twice a day for 6 months that he reports stopping in October.  Has been having black stools several times per day for the past week.  Denies abdominal pain, nausea or vomiting.  Lightheadedness prior to coming to the hospital.  EGD in 2024 showed gastritis. Colonoscopy in 08/2022 showed diverticulosis, hemorrhoids and 1 adenomatous polyp was removed.  Hemoglobin 8.5.  CT angiogram negative for active GI bleeding.  A hypoenhancing pancreatic tail mass noted.  Past Medical History:  Diagnosis Date   Atrial fibrillation (HCC)    Dyslipidemia    ED (erectile dysfunction)    Hyperlipidemia    Hyperparathyroidism    Hypertension    Hypogonadism, male    Sleep apnea     Past Surgical History:  Procedure Laterality Date   PARATHYROIDECTOMY     SKIN BIOPSY Left 03/02/2019   squamous cell carcinoma in situ, hypertrophic, traumatized   SKIN BIOPSY Right 08/01/2021   residual squamous cell caarcinoma    Prior to Admission medications   Medication Sig Start Date End Date Taking? Authorizing Provider  amLODipine  (NORVASC ) 10 MG tablet Take 1 tablet (10 mg total) by mouth daily. for blood pressure. 09/28/23  Yes Clark, Katherine K, NP  dapagliflozin  propanediol (FARXIGA ) 10 MG TABS tablet TAKE 1 TABLET (10 MG TOTAL) BY MOUTH DAILY BEFORE BREAKFAST. FOR DIABETES. Patient taking differently: Take 10 mg by mouth in the morning. 12/04/23  Yes [name], NP  EPINEPHrine  (EPI-PEN) 0.3 mg/0.3 mL DEVI Inject 0.3 mLs (0.3 mg total) into the muscle once. Patient taking differently: Inject 0.3 mg into the muscle once as needed (for anaphylaxis). 03/14/14  Yes Joyce Norleen BROCKS, MD  gabapentin   (NEURONTIN ) 300 MG capsule Take 300 mg by mouth in the morning and at bedtime. 06/19/23  Yes [provider]  Glucosamine-Chondroit-Vit C-Mn (GLUCOSAMINE 1500 COMPLEX) CAPS Take 1 capsule by mouth daily.    Yes [provider]  metoprolol  succinate (TOPROL -XL) 25 MG 24 hr tablet TAKE 1 TABLET BY MOUTH EVERY DAY FOR BLOOD PRESSURE Patient taking differently: Take 25 mg by mouth in the morning. 12/13/23  Yes Clark, Katherine K, NP  Multiple Vitamins-Minerals (MULTIVITAMIN WITH MINERALS) tablet Take 1 tablet by mouth daily with breakfast.   Yes [provider]  PRESCRIPTION MEDICATION CPAP- At bedtime   Yes [provider]  rosuvastatin  (CRESTOR ) 40 MG tablet TAKE 1 TABLET BY MOUTH EVERY DAY Patient taking differently: Take 40 mg by mouth at bedtime. 09/22/23  Yes Joyce Norleen BROCKS, MD  spironolactone  (ALDACTONE ) 50 MG tablet TAKE 1 TABLET (50 MG TOTAL) BY MOUTH DAILY. FOR BLOOD PRESSURE. Patient taking differently: Take 50 mg by mouth in the morning. 11/02/23  Yes Clark, Katherine K, NP  tadalafil  (CIALIS ) 20 MG tablet Take 1 tablet (20 mg total) by mouth daily as needed for erectile dysfunction. 02/04/23  Yes Joyce Norleen BROCKS, MD  Testosterone  20.25 MG/ACT (1.62%) GEL APPLY 2 PUMPS TO THE SHOULDER AND ARM DAILY Patient taking differently: Apply 2 Pump topically See admin instructions. APPLY 40.50 mg (2 PUMPS) TO A SHOULDER AND ARM DAILY- after the morning shower 01/22/24  Yes [name], NP  valsartan  (DIOVAN ) 320 MG tablet TAKE 1 TABLET (  320 MG TOTAL) BY MOUTH DAILY. FOR BLOOD PRESSURE. 12/04/23 12/03/24 Yes [name], NP  Accu-Chek FastClix Lancets MISC 1 each by Does not apply route daily. 10/16/20   Joyce Norleen BROCKS, MD  apixaban  (ELIQUIS ) 5 MG TABS tablet Take 1 tablet (5 mg total) by mouth 2 (two) times daily. Patient not taking: Reported on 02/09/2024 05/14/23   [name], MD  glucose blood (ACCU-CHEK GUIDE) test strip Use one daily to check  blood sugar 10/16/20   Joyce Norleen BROCKS, MD  Iron , Ferrous Sulfate , 325 (65 Fe) MG TABS Take 65 mg by mouth daily with breakfast. Patient not taking: Reported on 02/09/2024 08/09/22 02/09/24  [place], Eric J, DO    Scheduled Meds:  [facility] Hold] gabapentin   300 mg Oral QHS   [MAR Hold] insulin  aspart  0-9 Units Subcutaneous Q4H   [MAR Hold] pantoprazole  (PROTONIX ) IV  40 mg Intravenous Q12H   [MAR Hold] rosuvastatin   40 mg Oral QHS   Continuous Infusions:  sodium chloride      PRN Meds:.[MAR Hold] acetaminophen  **OR** [MAR Hold] acetaminophen , [MAR Hold] fentaNYL  (SUBLIMAZE ) injection, [MAR Hold] HYDROcodone -acetaminophen , [MAR Hold] ondansetron  **OR** [MAR Hold] ondansetron  (ZOFRAN ) IV  Allergies as of 02/09/2024 - Review Complete 02/09/2024  Allergen Reaction Noted   Yellow jacket venom Anaphylaxis 03/29/2015    Family History  Problem Relation Age of Onset   Hypertension Father    Hyperlipidemia Father     Social History   Socioeconomic History   Marital status: Married    Spouse name: Not on file   Number of children: 2   Years of education: Not on file   Highest education level: Master's degree (e.g., MA, MS, MEng, MEd, MSW, MBA)  Occupational History   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[employer]  Tobacco Use   Smoking status: Never   Smokeless tobacco: Never  Vaping Use   Vaping status: Never Used  Substance and Sexual Activity   Alcohol use: Yes    Alcohol/week: 14.0 standard drinks of alcohol    Types: 14 Cans of beer per week   Drug use: No   Sexual activity: Yes  Other Topics Concern   Not on file  Social History Narrative   Not on file   Social Drivers of Health   Financial Resource Strain: Low Risk  (02/09/2024)   Overall Financial Resource Strain (CARDIA)    Difficulty of Paying Living Expenses: Not very hard  Food Insecurity: No Food Insecurity (02/09/2024)   Hunger Vital Sign    Worried About Running Out of Food in the Last Year: Never true    Ran Out of  Food in the Last Year: Never true  Transportation Needs: No Transportation Needs (02/09/2024)   PRAPARE - Administrator, Civil Service (Medical): No    Lack of Transportation (Non-Medical): No  Physical Activity: Sufficiently Active (02/09/2024)   Exercise Vital Sign    Days of Exercise per Week: 3 days    Minutes of Exercise per Session: 50 min  Stress: No Stress Concern Present (02/09/2024)   [employer] of Occupational Health - Occupational Stress Questionnaire    Feeling of Stress: Only a little  Social Connections: Moderately Integrated (02/10/2024)   Social Connection and Isolation Panel    Frequency of Communication with Friends and Family: Three times a week    Frequency of Social Gatherings with Friends and Family: Three times a week    Attends Religious Services: More than 4 times per year  Active Member of Clubs or Organizations: No    Attends Banker Meetings: Never    Marital Status: Married  Catering Manager Violence: Not At Risk (02/09/2024)   Humiliation, Afraid, Rape, and Kick questionnaire    Fear of Current or Ex-Partner: No    Emotionally Abused: No    Physically Abused: No    Sexually Abused: No    Review of Systems: All negative except as stated above in HPI.  Physical Exam: Vital signs: Vitals:   02/10/24 0746 02/10/24 1158  BP: 123/62 (!) 149/60  Pulse: 65 74  Resp: 16 14  Temp: 98 F (36.7 C) 98 F (36.7 C)  SpO2: 99% 99%   Last BM Date : 02/08/24 (per patient) General:   Lethargic, elderly, well-nourished, pleasant, no acute distress  Head: normocephalic, atraumatic Eyes: anicteric sclera ENT: oropharynx clear Neck: supple, nontender Lungs:  Clear throughout to auscultation.   No wheezes, crackles, or rhonchi. No acute distress. Heart:  Regular rate and rhythm; no murmurs, clicks, rubs,  or gallops. Abdomen: Soft nontender nondistended positive bowel sounds Rectal:  Deferred Ext: no edema  GI:  Lab  Results: Recent Labs    02/09/24 2055 02/10/24 0215 02/10/24 0717  WBC 4.3 4.0 4.1  HGB 8.5* 7.8* 8.5*  HCT 27.1* 24.3* 26.5*  PLT 104* 98* 99*   BMET Recent Labs    02/09/24 1430 02/10/24 0215  NA 136 138  K 4.6 3.8  CL 101 105  CO2 23 22  GLUCOSE 191* 136*  BUN 44* 37*  CREATININE 1.81* 1.60*  CALCIUM  10.1 9.1   LFT Recent Labs    02/10/24 0215  PROT 5.7*  ALBUMIN 3.7  AST 20  ALT 17  ALKPHOS 39  BILITOT 0.9   PT/INR Recent Labs    02/09/24 1430  LABPROT 14.1  INR 1.0        Impression/Plan: Melenic stools in the setting of recent Eliquis  use and chronic NSAID abuse.  He reports stopping NSAIDs in October.  Pancreatic tail mass seen on CT angiogram.  EGD to evaluate for peptic ulcer disease although he could have gastric involvement from pancreatic mass.  Will defer to Dr. Burnette feasibility of EUS.      LOS: 1 day   [name] [name]  02/10/2024, 12:19 PM  Questions please call [phone]

## 2024-02-10 NOTE — TOC Transition Note (Signed)
 Transition of Care [facility]) - Discharge Note   Patient Details  Name: [name] [name] MRN: [id number] Date of Birth: 05-07-1950  [facility] CM/SW Contact:  Bascom Service, RN Phone Number: 02/10/2024, 2:41 PM   Clinical Narrative:d/c home       Final next level of care: Home/Self Care Barriers to Discharge: No Barriers Identified   Patient Goals and CMS Choice Patient states their goals for this hospitalization and ongoing recovery are:: Home CMS Medicare.gov Compare Post Acute Care list provided to:: Patient Represenative (must comment) (Lisa(spouse)) Choice offered to / list presented to : Patient [place] ownership interest in [facility].provided to:: Patient    Discharge Placement                       Discharge Plan and Services Additional resources added to the After Visit Summary for     Discharge Planning Services: CM Consult                                 Social Drivers of Health (SDOH) Interventions SDOH Screenings   Food Insecurity: No Food Insecurity (02/09/2024)  Housing: Low Risk  (02/09/2024)  Transportation Needs: No Transportation Needs (02/09/2024)  Utilities: Not At Risk (02/09/2024)  Alcohol Screen: Low Risk  (02/09/2024)  Depression (PHQ2-9): Low Risk  (02/09/2024)  Financial Resource Strain: Low Risk  (02/09/2024)  Physical Activity: Sufficiently Active (02/09/2024)  Social Connections: Moderately Integrated (02/10/2024)  Stress: No Stress Concern Present (02/09/2024)  Tobacco Use: Low Risk  (02/10/2024)     Readmission Risk Interventions     No data to display

## 2024-02-10 NOTE — Care Management CC44 (Signed)
 Condition Code 44 Documentation Completed  Patient Details  Name: [name] [name] MRN: [id number] Date of Birth: Dec 03, 1950   Condition Code 44 given:  Yes Patient signature on Condition Code 44 notice:  Yes Documentation of 2 MD's agreement:  Yes Code 44 added to claim:  Yes    Bascom Service, RN 02/10/2024, 2:40 PM

## 2024-02-10 NOTE — Progress Notes (Signed)
 Mobility Specialist - Progress Note  (RA) Pre-mobility: 62 bpm HR, 99% SpO2 During mobility: 102 bpm HR, 98-100% SpO2   02/10/24 0916  Mobility  Activity Ambulated independently  Level of Assistance Independent after set-up  Assistive Device None  Distance Ambulated (ft) 500 ft  Range of Motion/Exercises Active  Activity Response Tolerated well  Mobility visit 1 Mobility  Mobility Specialist Start Time (ACUTE ONLY) 0900  Mobility Specialist Stop Time (ACUTE ONLY) 0910  Mobility Specialist Time Calculation (min) (ACUTE ONLY) 10 min   Pt was found in bed and agreeable to mobilize. No complaints. At EOS returned to room with all needs met. Call bell in reach and wife in room.   Erminio Leos,  Mobility Specialist Can be reached via Secure Chat

## 2024-02-10 NOTE — Discharge Summary (Signed)
 Physician Discharge Summary  [name] [name] FMW:[id number] DOB: 03-20-1950 DOA: 02/09/2024  PCP: [name] [name] POUR, NP  Admit date: 02/09/2024 Discharge date: 02/10/2024  Admitted From: Home Disposition: Home  Recommendations for Outpatient Follow-up:  Follow up with PCP in 1 week with repeat CBC/BMP Outpatient follow-up with GI and oncology Follow up in ED if symptoms worsen or new appear   Home Health: No Equipment/Devices: None  Discharge Condition: Stable CODE STATUS: Full Diet recommendation: Heart healthy/carb modified diet  Brief/Interim Summary: 73 year old male with history of A-fib on Eliquis , CAD, diabetes mellitus type 2, CAD, sleep apnea, pericardial effusion, hyperlipidemia, hyperparathyroidism presented with melena.  On presentation, hemoglobin was 9.3.  He was started on IV Protonix .  CTA GI bleed showed findings concerning for possible pancreatic tail mass concerning for adenocarcinoma.  GI and oncology were consulted.  He underwent EGD today which showed acute gastritis which was biopsied but no ulcers.  GI subsequently recommended to advance diet and outpatient follow-up for possible EUS.  GI has cleared the patient for discharge.  Oncology has also cleared the patient for discharge and will arrange for outpatient follow-up.  Patient is not a candidate for IR guided biopsy of pancreatic tail mass.  Patient really wants to go home today.  Discharge patient home today.  Outpatient follow-up with PCP/GI and oncology.  Will hold Eliquis  till evaluation by Dr. Burnette as an outpatient for possible EUS.  Discharge Diagnoses:   Possible upper GI bleeding presenting with melena Acute blood loss anemia Acute gastritis -On presentation, hemoglobin was 9.3.  He was started on IV Protonix .  CTA GI bleed showed findings concerning for possible pancreatic tail mass concerning for adenocarcinoma.  GI and oncology were consulted.  He underwent EGD today which showed acute gastritis  which was biopsied but no ulcers.  GI subsequently recommended to advance diet and outpatient follow-up for possible EUS.  GI has cleared the patient for discharge. - Continue Protonix  once a day on discharge.  Pancreatic tail mass with concern for malignancy - GI recommended outpatient follow-up for possible EUS.  - Oncology has also cleared the patient for discharge and will arrange for outpatient follow-up.   Paroxysmal A-fib -Will hold Eliquis  till evaluation by Dr. Burnette as an outpatient for possible EUS..  Currently rate controlled and intermittently bradycardic.  Will keep metoprolol  on hold because of hypotension.  Outpatient follow-up with PCP  Hypertension Blood pressure intermittently on the lower side.-Hold antihypertensive regimen till reevaluation by PCP  Sleep apnea - Continue CPAP  Obesity class I - Outpatient follow-up  Diabetes mellitus type 2 with hyperglycemia - Continue home regimen.  Carb management diet  Hyperlipidemia -Continue Crestor     Discharge Instructions  Discharge Instructions     Diet Carb Modified   Complete by: As directed    Increase activity slowly   Complete by: As directed       Allergies as of 02/10/2024       Reactions   Yellow Jacket Venom Anaphylaxis        Medication List     STOP taking these medications    amLODipine  10 MG tablet Commonly known as: NORVASC    apixaban  5 MG Tabs tablet Commonly known as: ELIQUIS    metoprolol  succinate 25 MG 24 hr tablet Commonly known as: TOPROL -XL   spironolactone  50 MG tablet Commonly known as: ALDACTONE    valsartan  320 MG tablet Commonly known as: DIOVAN        TAKE these medications    Accu-Chek FastClix Lancets  Misc 1 each by Does not apply route daily.   Accu-Chek Guide test strip Generic drug: glucose blood Use one daily to check blood sugar   EPINEPHrine  0.3 mg/0.3 mL Devi Commonly known as: EPI-PEN Inject 0.3 mLs (0.3 mg total) into the muscle once as  needed (for anaphylaxis).   Farxiga  10 MG Tabs tablet Generic drug: dapagliflozin  propanediol TAKE 1 TABLET (10 MG TOTAL) BY MOUTH DAILY BEFORE BREAKFAST. FOR DIABETES. What changed: See the new instructions.   gabapentin  300 MG capsule Commonly known as: NEURONTIN  Take 300 mg by mouth in the morning and at bedtime.   Glucosamine 1500 Complex Caps Take 1 capsule by mouth daily.   HYDROcodone -acetaminophen  5-325 MG tablet Commonly known as: NORCO/VICODIN Take 1-2 tablets by mouth every 6 (six) hours as needed for moderate pain (pain score 4-6).   Iron  (Ferrous Sulfate ) 325 (65 Fe) MG Tabs Take 65 mg by mouth daily with breakfast.   multivitamin with minerals tablet Take 1 tablet by mouth daily with breakfast.   pantoprazole  40 MG tablet Commonly known as: Protonix  Take 1 tablet (40 mg total) by mouth daily. Start taking on: February 11, 2024   PRESCRIPTION MEDICATION CPAP- At bedtime   rosuvastatin  40 MG tablet Commonly known as: CRESTOR  Take 1 tablet (40 mg total) by mouth at bedtime.   tadalafil  20 MG tablet Commonly known as: Cialis  Take 1 tablet (20 mg total) by mouth daily as needed for erectile dysfunction.   Testosterone  20.25 MG/ACT (1.62%) Gel APPLY 2 PUMPS TO THE SHOULDER AND ARM DAILY What changed: See the new instructions.          Follow-up Information     [name] [name] POUR, NP. Schedule an appointment as soon as possible for a visit in 1 week(s).   Specialty: Internal Medicine Why: with repeat cbc/bmp Contact information: [address] [name] [address] [phone]         [name], MD Follow up.   Specialty: Gastroenterology Contact information: [address] [place] [address] [phone]                Allergies  Allergen Reactions   Yellow Jacket Venom Anaphylaxis    Consultations: GI/oncology    Subjective: Patient seen and examined at bedside.  Feels okay.  Denies any  melena, nausea, vomiting since admission.  Wants to go home today  Discharge Exam: Vitals:   02/10/24 1318 02/10/24 1320  BP:  (!) 121/56  Pulse: 63 62  Resp: 12 16  Temp:    SpO2: 98% 99%    General: Pt is alert, awake, not in acute distress, on room air Cardiovascular: rate controlled, S1/S2 + Respiratory: bilateral decreased breath sounds at bases Abdominal: Soft, obese, NT, ND, bowel sounds + Extremities: no edema, no cyanosis    The results of significant diagnostics from this hospitalization (including imaging, microbiology, ancillary and laboratory) are listed below for reference.     Microbiology: No results found for this or any previous visit (from the past 240 hours).   Labs: BNP (last 3 results) No results for input(s): BNP in the last 8760 hours. Basic Metabolic Panel: Recent Labs  Lab 02/09/24 1430 02/09/24 2055 02/10/24 0215  NA 136  --  138  K 4.6  --  3.8  CL 101  --  105  CO2 23  --  22  GLUCOSE 191*  --  136*  BUN 44*  --  37*  CREATININE 1.81*  --  1.60*  CALCIUM  10.1  --  9.1  MG  --  2.5* 2.5*  PHOS  --  3.6 3.4   Liver Function Tests: Recent Labs  Lab 02/09/24 1430 02/10/24 0215  AST 28 20  ALT 22 17  ALKPHOS 47 39  BILITOT 1.1 0.9  PROT 7.1 5.7*  ALBUMIN 4.4 3.7   No results for input(s): LIPASE, AMYLASE in the last 168 hours. No results for input(s): AMMONIA in the last 168 hours. CBC: Recent Labs  Lab 02/09/24 1430 02/09/24 2055 02/10/24 0215 02/10/24 0717  WBC 6.7 4.3 4.0 4.1  HGB 9.3* 8.5* 7.8* 8.5*  HCT 28.5* 27.1* 24.3* 26.5*  MCV 94.4 95.4 94.9 95.7  PLT 125* 104* 98* 99*   Cardiac Enzymes: Recent Labs  Lab 02/09/24 2055  CKTOTAL 179   BNP: Invalid input(s): POCBNP CBG: Recent Labs  Lab 02/09/24 2015 02/10/24 0003 02/10/24 0416 02/10/24 0744 02/10/24 1118  GLUCAP 136* 145* 129* 134* 106*   D-Dimer No results for input(s): DDIMER in the last 72 hours. Hgb A1c No results for  input(s): HGBA1C in the last 72 hours. Lipid Profile No results for input(s): CHOL, HDL, LDLCALC, TRIG, CHOLHDL, LDLDIRECT in the last 72 hours. Thyroid function studies No results for input(s): TSH, T4TOTAL, T3FREE, THYROIDAB in the last 72 hours.  Invalid input(s): FREET3 Anemia work up Recent Labs    02/09/24 2055 02/10/24 0215  VITAMINB12  --  598  FOLATE  --  >20.0  FERRITIN 57  --   TIBC 395  --   IRON  29*  --   RETICCTPCT 6.7*  --    Urinalysis    Component Value Date/Time   COLORURINE STRAW (A) 02/10/2024 0044   APPEARANCEUR CLEAR 02/10/2024 0044   LABSPEC 1.016 02/10/2024 0044   PHURINE 5.0 02/10/2024 0044   GLUCOSEU >=500 (A) 02/10/2024 0044   HGBUR NEGATIVE 02/10/2024 0044   BILIRUBINUR NEGATIVE 02/10/2024 0044   BILIRUBINUR neg 03/08/2014 0934   KETONESUR NEGATIVE 02/10/2024 0044   PROTEINUR NEGATIVE 02/10/2024 0044   UROBILINOGEN negative 03/08/2014 0934   NITRITE NEGATIVE 02/10/2024 0044   LEUKOCYTESUR NEGATIVE 02/10/2024 0044   Sepsis Labs Recent Labs  Lab 02/09/24 1430 02/09/24 2055 02/10/24 0215 02/10/24 0717  WBC 6.7 4.3 4.0 4.1   Microbiology No results found for this or any previous visit (from the past 240 hours).   Time coordinating discharge: 35 minutes  SIGNED:   Sophie Mao, MD  [facility] 02/10/2024, 1:21 PM

## 2024-02-10 NOTE — TOC Initial Note (Signed)
 Transition of Care [facility]) - Initial/Assessment Note    Patient Details  Name: [name] [name] MRN: [id number] Date of Birth: 07-25-1950  Transition of Care [facility]) CM/SW Contact:    Bascom Service, RN Phone Number: 02/10/2024, 2:06 PM  Clinical Narrative: d/c plan home. Already spoke to spouse Giles) of possible condition code 45 with melena-she verbalized understanding.still awaiting more info to proceed. Has own transport home.                 Expected Discharge Plan: Home/Self Care Barriers to Discharge: No Barriers Identified   Patient Goals and CMS Choice Patient states their goals for this hospitalization and ongoing recovery are:: Home CMS Medicare.gov Compare Post Acute Care list provided to:: Patient Represenative (must comment) (Lisa(spouse)) Choice offered to / list presented to : Patient [place] ownership interest in [facility].provided to:: Patient    Expected Discharge Plan and Services   Discharge Planning Services: CM Consult   Living arrangements for the past 2 months: Single Family Home                                      Prior Living Arrangements/Services Living arrangements for the past 2 months: Single Family Home Lives with:: Spouse                   Activities of Daily Living   ADL Screening (condition at time of admission) Independently performs ADLs?: Yes (appropriate for developmental age) Is the patient deaf or have difficulty hearing?: No Does the patient have difficulty seeing, even when wearing glasses/contacts?: No Does the patient have difficulty concentrating, remembering, or making decisions?: No  Permission Sought/Granted                  Emotional Assessment              Admission diagnosis:  Melena [K92.1] Upper GI bleed [K92.2] Pancreatic mass [K86.89] AKI (acute kidney injury) [N17.9] Gastrointestinal hemorrhage, unspecified gastrointestinal hemorrhage type [K92.2] Patient Active Problem  List   Diagnosis Date Noted   Melena 02/09/2024   Upper GI bleed 02/09/2024   AKI (acute kidney injury) 02/09/2024   Pancreatic mass 02/09/2024   Chronic diastolic CHF (congestive heart failure) (HCC) 02/09/2024   Symptomatic anemia 02/09/2024   Exertional dyspnea 05/13/2023   Hypokalemia 04/17/2023   Atrial fibrillation (HCC) 04/17/2023   Melanoma in situ (HCC) 02/03/2023   Squamous cell carcinoma of arm, left 09/16/2022   Squamous cell carcinoma in situ (SCCIS) of dorsum of hand 06/17/2022   Diabetic nephropathy associated with type 2 diabetes mellitus (HCC) 10/09/2021   Squamous cell carcinoma in situ (SCCIS) of skin of chest 08/21/2021   Type 2 diabetes mellitus with hyperglycemia (HCC) 10/16/2020   Aortic atherosclerosis 10/16/2020   History of GI diverticular bleed 02/14/2020   Diverticulosis 11/03/2019   Spinal stenosis 10/06/2019   Essential hypertension 01/17/2011   Hyperlipidemia 01/17/2011   Hypogonadism male 01/17/2011   Sleep apnea 01/17/2011   History of hyperparathyroidism 01/17/2011   ED (erectile dysfunction) 01/17/2011   Obesity (BMI 30-39.9) 01/17/2011   PCP:  [name], NP Pharmacy:   [facility] [phone] - [address], [place] - [address] [place] [address] Phone: [phone] Fax: [phone]     Social Drivers of Health (SDOH) Social History: SDOH Screenings   Food Insecurity: No Food Insecurity (02/09/2024)  Housing: Low Risk  (02/09/2024)  Transportation Needs: No Transportation Needs (02/09/2024)  Utilities: Not At Risk (02/09/2024)  Alcohol Screen: Low Risk  (02/09/2024)  Depression (PHQ2-9): Low Risk  (02/09/2024)  Financial Resource Strain: Low Risk  (02/09/2024)  Physical Activity: Sufficiently Active (02/09/2024)  Social Connections: Moderately Integrated (02/10/2024)  Stress: No Stress Concern Present (02/09/2024)  Tobacco Use: Low Risk  (02/10/2024)   SDOH Interventions:     Readmission Risk  Interventions     No data to display

## 2024-02-10 NOTE — Anesthesia Postprocedure Evaluation (Signed)
 Anesthesia Post Note  Patient: [name] [name]  Procedure(s) Performed: EGD (ESOPHAGOGASTRODUODENOSCOPY)     Patient location during evaluation: PACU Anesthesia Type: MAC Level of consciousness: awake and alert Pain management: pain level controlled Vital Signs Assessment: post-procedure vital signs reviewed and stable Respiratory status: spontaneous breathing, nonlabored ventilation, respiratory function stable and patient connected to nasal cannula oxygen Cardiovascular status: stable and blood pressure returned to baseline Postop Assessment: no apparent nausea or vomiting Anesthetic complications: no   No notable events documented.  Last Vitals:  Vitals:   02/10/24 0746 02/10/24 1158  BP: 123/62 (!) 149/60  Pulse: 65 74  Resp: 16 14  Temp: 36.7 C 36.7 C  SpO2: 99% 99%    Last Pain:  Vitals:   02/10/24 1158  TempSrc: Temporal  PainSc: 0-No pain                 Oliwia Berzins

## 2024-02-10 NOTE — Transfer of Care (Signed)
 Immediate Anesthesia Transfer of Care Note  Patient: [name] [name]  Procedure(s) Performed: EGD (ESOPHAGOGASTRODUODENOSCOPY)  Patient Location: PACU  Anesthesia Type:MAC  Level of Consciousness: oriented, drowsy, and patient cooperative  Airway & Oxygen Therapy: Patient Spontanous Breathing and Patient connected to face mask oxygen  Post-op Assessment: Report given to RN and Post -op Vital signs reviewed and stable  Post vital signs: Reviewed and stable  Last Vitals:  Vitals Value Taken Time  BP    Temp    Pulse    Resp    SpO2      Last Pain:  Vitals:   02/10/24 1158  TempSrc: Temporal  PainSc: 0-No pain         Complications: No notable events documented.

## 2024-02-11 LAB — CEA: CEA: 6.2 ng/mL — ABNORMAL HIGH (ref 0.0–4.7)

## 2024-02-11 LAB — CANCER ANTIGEN 19-9
CA 19-9: 771 U/mL — ABNORMAL HIGH (ref 0–35)
CA 19-9: 739 U/mL — ABNORMAL HIGH (ref 0–35)

## 2024-02-12 ENCOUNTER — Encounter: Payer: Self-pay | Admitting: Family Medicine

## 2024-02-12 LAB — SURGICAL PATHOLOGY

## 2024-02-15 ENCOUNTER — Other Ambulatory Visit: Payer: Self-pay | Admitting: Gastroenterology

## 2024-02-15 ENCOUNTER — Telehealth: Payer: Self-pay

## 2024-02-15 ENCOUNTER — Telehealth: Payer: Self-pay | Admitting: Hematology

## 2024-02-15 NOTE — Telephone Encounter (Signed)
 Can we cancel his lab appointment and 11:20 am appointment with me on 02/18/24? I have rescheduled him for 12:20 pm on 12/04.

## 2024-02-15 NOTE — Telephone Encounter (Signed)
 Pt is called and scheduled and pt is aware of appt and times .

## 2024-02-15 NOTE — Transitions of Care (Post Inpatient/ED Visit) (Signed)
   02/15/2024  Name: BOBIE KISTLER MRN: [id number] DOB: Nov 16, 1950  Today's TOC FU Call Status: Today's TOC FU Call Status:: Unsuccessful Call (1st Attempt) Unsuccessful Call (1st Attempt) Date: 02/15/24  Attempted to reach the patient regarding the most recent Inpatient/ED visit.  Follow Up Plan: Additional outreach attempts will be made to reach the patient to complete the Transitions of Care (Post Inpatient/ED visit) call.   Signature  Charmaine Bloodgood, LPN [facility] Advisor [place] [facility] You Are. We Are. One [facility] Direct Dial [phone]

## 2024-02-16 ENCOUNTER — Telehealth (HOSPITAL_BASED_OUTPATIENT_CLINIC_OR_DEPARTMENT_OTHER): Payer: Self-pay

## 2024-02-16 ENCOUNTER — Encounter (HOSPITAL_COMMUNITY): Payer: Self-pay | Admitting: Gastroenterology

## 2024-02-16 NOTE — Telephone Encounter (Signed)
 Noted.   Appts have been c/x.

## 2024-02-16 NOTE — Telephone Encounter (Signed)
   Pre-operative Risk Assessment    Patient Name: [name] [name]  DOB: 1950-09-27 MRN: [id number]   Date of last office visit: 12/24/23 with Dr. Verlin Date of next office visit: NA  Request for Surgical Clearance    Procedure:  EUS  Date of Surgery:  Clearance 02/17/24                                 Surgeon:  Dr. burnette Surgeon's Group or Practice Name:  [facility] Phone number:  [phone] Fax number:  [phone]   Type of Clearance Requested:   - Medical  - Pharmacy:  Hold Apixaban  (Eliquis ) not indicated or in patient's chart   Type of Anesthesia:  propofol    Additional requests/questions:    [name]   02/16/2024, 11:10 AM

## 2024-02-16 NOTE — Anesthesia Preprocedure Evaluation (Signed)
 Anesthesia Evaluation  Patient identified by MRN, date of birth, ID band Patient awake    Reviewed: Allergy & Precautions, NPO status , Patient's Chart, lab work & pertinent test results  History of Anesthesia Complications Negative for: history of anesthetic complications  Airway Mallampati: II  TM Distance: >3 FB Neck ROM: Full    Dental no notable dental hx.    Pulmonary sleep apnea    Pulmonary exam normal        Cardiovascular hypertension, +CHF  Normal cardiovascular exam+ dysrhythmias Atrial Fibrillation      Neuro/Psych negative neurological ROS     GI/Hepatic Neg liver ROS,GERD  Medicated,,pancreatic mass   Endo/Other  diabetes, Type 2, Oral Hypoglycemic Agents    Renal/GU Renal InsufficiencyRenal disease (Cr 1.6)     Musculoskeletal negative musculoskeletal ROS (+)    Abdominal   Peds  Hematology  (+) Blood dyscrasia (Hgb 8.5, Plt 98k), anemia   Anesthesia Other Findings   Reproductive/Obstetrics                              Anesthesia Physical Anesthesia Plan  ASA: 3  Anesthesia Plan: MAC   Post-op Pain Management: Minimal or no pain anticipated   Induction:   PONV Risk Score and Plan: 1 and Treatment may vary due to age or medical condition and Propofol  infusion  Airway Management Planned: Natural Airway and Nasal Cannula  Additional Equipment: None  Intra-op Plan:   Post-operative Plan:   Informed Consent: I have reviewed the patients History and Physical, chart, labs and discussed the procedure including the risks, benefits and alternatives for the proposed anesthesia with the patient or authorized representative who has indicated his/her understanding and acceptance.     Dental advisory given  Plan Discussed with: CRNA  Anesthesia Plan Comments:          Anesthesia Quick Evaluation

## 2024-02-16 NOTE — Telephone Encounter (Signed)
   Name: [name] [name]  DOB: April 28, 1950  MRN: [id number]   Primary Cardiologist: Lonni Cash, MD  Chart reviewed as part of pre-operative protocol coverage. Patient was contacted 02/16/2024 in reference to pre-operative risk assessment for pending surgery as outlined below.  ARJAN STROHM was last seen on 12/24/2023 by Dr. Cash.  Since that day, ACEN CRAUN has done well from a cardiac standpoint.  He denies any new symptoms or concerns.  He is able to complete greater than 4 METS without difficulty.  Therefore, based on ACC/AHA guidelines, the patient would be at acceptable risk for the planned procedure without further cardiovascular testing.   The patient was advised that if he develops new symptoms prior to surgery to contact our office to arrange for a follow-up visit, and he verbalized understanding.  Eliquis  is currently on hold pending GI evaluation.  I will route this recommendation to the requesting party via Epic fax function and remove from pre-op pool. Please call with questions.  Damien JAYSON Braver, NP 02/16/2024, 11:41 AM

## 2024-02-17 ENCOUNTER — Encounter (HOSPITAL_COMMUNITY): Admission: RE | Disposition: A | Payer: Self-pay | Source: Home / Self Care | Attending: Gastroenterology

## 2024-02-17 ENCOUNTER — Ambulatory Visit (HOSPITAL_COMMUNITY)
Admission: RE | Admit: 2024-02-17 | Discharge: 2024-02-17 | Disposition: A | Attending: Gastroenterology | Admitting: Gastroenterology

## 2024-02-17 ENCOUNTER — Ambulatory Visit (HOSPITAL_COMMUNITY): Payer: Self-pay | Admitting: Anesthesiology

## 2024-02-17 ENCOUNTER — Other Ambulatory Visit: Payer: Self-pay

## 2024-02-17 ENCOUNTER — Encounter (HOSPITAL_COMMUNITY): Payer: Self-pay | Admitting: Gastroenterology

## 2024-02-17 DIAGNOSIS — I4891 Unspecified atrial fibrillation: Secondary | ICD-10-CM

## 2024-02-17 DIAGNOSIS — K8689 Other specified diseases of pancreas: Secondary | ICD-10-CM | POA: Diagnosis not present

## 2024-02-17 DIAGNOSIS — G4730 Sleep apnea, unspecified: Secondary | ICD-10-CM | POA: Diagnosis not present

## 2024-02-17 DIAGNOSIS — I110 Hypertensive heart disease with heart failure: Secondary | ICD-10-CM

## 2024-02-17 DIAGNOSIS — Z7984 Long term (current) use of oral hypoglycemic drugs: Secondary | ICD-10-CM | POA: Diagnosis not present

## 2024-02-17 DIAGNOSIS — I509 Heart failure, unspecified: Secondary | ICD-10-CM | POA: Diagnosis not present

## 2024-02-17 DIAGNOSIS — I5032 Chronic diastolic (congestive) heart failure: Secondary | ICD-10-CM

## 2024-02-17 DIAGNOSIS — E119 Type 2 diabetes mellitus without complications: Secondary | ICD-10-CM | POA: Diagnosis not present

## 2024-02-17 DIAGNOSIS — K8020 Calculus of gallbladder without cholecystitis without obstruction: Secondary | ICD-10-CM

## 2024-02-17 DIAGNOSIS — Z7901 Long term (current) use of anticoagulants: Secondary | ICD-10-CM | POA: Diagnosis not present

## 2024-02-17 DIAGNOSIS — C252 Malignant neoplasm of tail of pancreas: Secondary | ICD-10-CM | POA: Diagnosis not present

## 2024-02-17 HISTORY — PX: FINE NEEDLE ASPIRATION BIOPSY: CATH118315

## 2024-02-17 HISTORY — DX: Type 2 diabetes mellitus without complications: E11.9

## 2024-02-17 HISTORY — PX: ESOPHAGOGASTRODUODENOSCOPY: SHX5428

## 2024-02-17 HISTORY — PX: EUS: SHX5427

## 2024-02-17 LAB — GLUCOSE, CAPILLARY: Glucose-Capillary: 165 mg/dL — ABNORMAL HIGH (ref 70–99)

## 2024-02-17 SURGERY — ULTRASOUND, UPPER GI TRACT, ENDOSCOPIC
Anesthesia: Monitor Anesthesia Care

## 2024-02-17 MED ORDER — PROPOFOL 10 MG/ML IV BOLUS
INTRAVENOUS | Status: DC | PRN
  Administered 2024-02-17: 30 mg via INTRAVENOUS
  Administered 2024-02-17: 80 mg via INTRAVENOUS
  Administered 2024-02-17: 125 ug/kg/min via INTRAVENOUS

## 2024-02-17 MED ORDER — PROPOFOL 1000 MG/100ML IV EMUL
INTRAVENOUS | Status: AC
  Filled 2024-02-17: qty 100

## 2024-02-17 MED ORDER — SODIUM CHLORIDE 0.9 % IV SOLN
INTRAVENOUS | Status: AC | PRN
  Administered 2024-02-17: 500 mL via INTRAMUSCULAR

## 2024-02-17 MED ORDER — APIXABAN 5 MG PO TABS: 5.0000 mg | ORAL_TABLET | Freq: Two times a day (BID) | ORAL | Status: AC

## 2024-02-17 MED ORDER — SODIUM CHLORIDE 0.9 % IV SOLN: INTRAVENOUS | Status: DC

## 2024-02-17 MED ORDER — LIDOCAINE 2% (20 MG/ML) 5 ML SYRINGE
INTRAMUSCULAR | Status: DC | PRN
  Administered 2024-02-17: 40 mg via INTRAVENOUS
  Administered 2024-02-17: 60 mg via INTRAVENOUS

## 2024-02-17 NOTE — Anesthesia Procedure Notes (Signed)
 Date/Time: 02/17/2024 9:16 AM  Performed by: Para Jerelene CROME, CRNAOxygen Delivery Method: Simple face mask Comments: POM Face Mask.

## 2024-02-17 NOTE — Transfer of Care (Signed)
 Immediate Anesthesia Transfer of Care Note  Patient: [name] [name]  Procedure(s) Performed: ULTRASOUND, UPPER GI TRACT, ENDOSCOPIC EGD (ESOPHAGOGASTRODUODENOSCOPY) FINE NEEDLE ASPIRATION BIOPSY  Patient Location: Endoscopy Unit  Anesthesia Type:MAC  Level of Consciousness: drowsy and patient cooperative  Airway & Oxygen Therapy: Patient Spontanous Breathing and Patient connected to face mask oxygen  Post-op Assessment: Report given to RN and Post -op Vital signs reviewed and stable  Post vital signs: Reviewed and stable  Last Vitals:  Vitals Value Taken Time  BP 119/59 02/17/24 10:03  Temp 36.3 C 02/17/24 10:03  Pulse 66 02/17/24 10:05  Resp 16 02/17/24 10:05  SpO2 98 % 02/17/24 10:05  Vitals shown include unfiled device data.  Last Pain:  Vitals:   02/17/24 1003  TempSrc: Oral  PainSc:          Complications: No notable events documented.

## 2024-02-17 NOTE — Interval H&P Note (Signed)
 History and Physical Interval Note:  02/17/2024 9:10 AM  [name] [name]  has presented today for surgery, with the diagnosis of pancreatic mass K86.9.  The various methods of treatment have been discussed with the patient and family. After consideration of risks, benefits and other options for treatment, the patient has consented to  Procedure(s) with comments: ULTRASOUND, UPPER GI TRACT, ENDOSCOPIC (N/A) - fine needle aspiration as a surgical intervention.  The patient's history has been reviewed, patient examined, no change in status, stable for surgery.  I have reviewed the patient's chart and labs.  Questions were answered to the patient's satisfaction.     [name]

## 2024-02-17 NOTE — Anesthesia Postprocedure Evaluation (Signed)
 Anesthesia Post Note  Patient: QUASIM DOYON  Procedure(s) Performed: ULTRASOUND, UPPER GI TRACT, ENDOSCOPIC EGD (ESOPHAGOGASTRODUODENOSCOPY) FINE NEEDLE ASPIRATION BIOPSY     Patient location during evaluation: PACU Anesthesia Type: MAC Level of consciousness: awake and alert Pain management: pain level controlled Vital Signs Assessment: post-procedure vital signs reviewed and stable Respiratory status: spontaneous breathing, nonlabored ventilation and respiratory function stable Cardiovascular status: blood pressure returned to baseline Postop Assessment: no apparent nausea or vomiting Anesthetic complications: no   No notable events documented.  Last Vitals:  Vitals:   02/17/24 1020 02/17/24 1030  BP: 126/67 134/75  Pulse: (!) 55 (!) 54  Resp: 13 12  Temp:    SpO2: 98% 98%    Last Pain:  Vitals:   02/17/24 1030  TempSrc:   PainSc: 0-No pain                 Vertell Row

## 2024-02-17 NOTE — Op Note (Signed)
 [facility] Patient Name: [name] [name] Procedure Date: 02/17/2024 MRN: [id number] Attending MD: Elsie Cree , MD, [id number] Date of Birth: 02-05-1951 CSN: [id number] Age: 73 Admit Type: Outpatient Procedure:                Upper EUS Indications:              Suspected mass in pancreas on CT scan Providers:                Elsie Cree, MD, Ozell Pouch, Haskel Chris, Technician Referring MD:             [name] (oncology) Medicines:                Monitored Anesthesia Care Complications:            No immediate complications. Estimated Blood Loss:     Estimated blood loss: none. Procedure:                Pre-Anesthesia Assessment:                           - Prior to the procedure, a History and Physical                            was performed, and patient medications and                            allergies were reviewed. The patient's tolerance of                            previous anesthesia was also reviewed. The risks                            and benefits of the procedure and the sedation                            options and risks were discussed with the patient.                            All questions were answered, and informed consent                            was obtained. Prior Anticoagulants: The patient has                            taken Eliquis  (apixaban ), last dose was 1 week                            prior to procedure. ASA Grade Assessment: III - A                            patient with severe systemic disease. After  reviewing the risks and benefits, the patient was                            deemed in satisfactory condition to undergo the                            procedure.                           After obtaining informed consent, the endoscope was                            passed under direct vision. Throughout the                            procedure, the patient's  blood pressure, pulse, and                            oxygen saturations were monitored continuously. The                            [id number] ([id number]) Olympus endosonoscope was                            introduced through the mouth, and advanced to the                            second part of duodenum. The upper EUS was                            accomplished without difficulty. The patient                            tolerated the procedure well. Scope In: Scope Out: Findings:      ENDOSONOGRAPHIC FINDING: :      There was no sign of significant endosonographic abnormality in the       common bile duct. The maximum diameter of the duct was 4 mm.      Multiple stones were visualized endosonographically in the gallbladder.       The stones were irregular. They were characterized by shadowing.      There was no sign of significant endosonographic abnormality in the       ampulla.      No lymphadenopathy seen.      There was no sign of significant endosonographic abnormality in the left       lobe of the liver. Homogeneous parenchyma was identified.      An irregular mass was identified in the pancreatic tail. The mass was       hypoechoic. The mass measured 58 mm by 43 mm in maximal cross-sectional       diameter. The endosonographic borders were poorly-defined. There was       sonographic evidence suggesting invasion into the splenic artery       (manifested by invasion). Fine needle aspiration for cytology was       performed. Color Doppler imaging was utilized prior to needle puncture  to confirm a lack of significant vascular structures within the needle       path. Five passes were made with the 25 gauge needle using a       transgastric approach. A stylet was used. A cytotechnologist was present       to evaluate the adequacy of the specimen. Final cytology results are       pending. Impression:               - There was no sign of significant pathology in the                             common bile duct.                           - Multiple stones were visualized                            endosonographically in the gallbladder.                           - There was no sign of significant pathology in the                            ampulla.                           - There was no evidence of significant pathology in                            the left lobe of the liver.                           - A mass was identified in the pancreatic tail.                            This was staged T4 N0 Mx by endosonographic                            criteria. Fine needle aspiration performed. Moderate Sedation:      None Recommendation:           - Discharge patient to home (ambulatory).                           - Resume previous diet today.                           - Continue present medications.                           - Await cytology results.                           - Resume Eliquis  (apixaban ) at prior dose in 2 days.                           -  Return to GI clinic PRN.                           - Return to referring physician as previously                            scheduled. Procedure Code(s):        --- Professional ---                           [phone], Esophagogastroduodenoscopy, flexible,                            transoral; with transendoscopic ultrasound-guided                            intramural or transmural fine needle                            aspiration/biopsy(s) (includes endoscopic                            ultrasound examination of the esophagus, stomach,                            and either the duodenum or a surgically altered                            stomach where the jejunum is examined distal to the                            anastomosis) Diagnosis Code(s):        --- Professional ---                           K80.20, Calculus of gallbladder without                            cholecystitis without obstruction                            K86.89, Other specified diseases of pancreas                           R93.3, Abnormal findings on diagnostic imaging of                            other parts of digestive tract CPT copyright 2022 American [employer] Association. All rights reserved. The codes documented in this report are preliminary and upon coder review may  be revised to meet current compliance requirements. Elsie Cree, MD 02/17/2024 10:08:31 AM This report has been signed electronically. Number of Addenda: 0

## 2024-02-17 NOTE — Discharge Instructions (Signed)

## 2024-02-18 ENCOUNTER — Other Ambulatory Visit

## 2024-02-18 ENCOUNTER — Encounter: Payer: Self-pay | Admitting: Primary Care

## 2024-02-18 ENCOUNTER — Ambulatory Visit: Admitting: Primary Care

## 2024-02-18 VITALS — BP 152/60 | HR 61 | Temp 97.9°F | Ht 71.0 in | Wt 222.5 lb

## 2024-02-18 DIAGNOSIS — C259 Malignant neoplasm of pancreas, unspecified: Secondary | ICD-10-CM | POA: Diagnosis not present

## 2024-02-18 DIAGNOSIS — E1165 Type 2 diabetes mellitus with hyperglycemia: Secondary | ICD-10-CM

## 2024-02-18 DIAGNOSIS — E785 Hyperlipidemia, unspecified: Secondary | ICD-10-CM

## 2024-02-18 DIAGNOSIS — K921 Melena: Secondary | ICD-10-CM

## 2024-02-18 DIAGNOSIS — I10 Essential (primary) hypertension: Secondary | ICD-10-CM

## 2024-02-18 DIAGNOSIS — E291 Testicular hypofunction: Secondary | ICD-10-CM | POA: Diagnosis not present

## 2024-02-18 DIAGNOSIS — Z7984 Long term (current) use of oral hypoglycemic drugs: Secondary | ICD-10-CM | POA: Diagnosis not present

## 2024-02-18 DIAGNOSIS — D649 Anemia, unspecified: Secondary | ICD-10-CM | POA: Diagnosis not present

## 2024-02-18 DIAGNOSIS — K8689 Other specified diseases of pancreas: Secondary | ICD-10-CM

## 2024-02-18 LAB — COMPREHENSIVE METABOLIC PANEL WITH GFR
ALT: 14 U/L (ref 0–53)
AST: 16 U/L (ref 0–37)
Albumin: 4.3 g/dL (ref 3.5–5.2)
Alkaline Phosphatase: 40 U/L (ref 39–117)
BUN: 24 mg/dL — ABNORMAL HIGH (ref 6–23)
CO2: 27 meq/L (ref 19–32)
Calcium: 9.2 mg/dL (ref 8.4–10.5)
Chloride: 106 meq/L (ref 96–112)
Creatinine, Ser: 1.51 mg/dL — ABNORMAL HIGH (ref 0.40–1.50)
GFR: 45.54 mL/min — ABNORMAL LOW (ref >60.00)
Glucose, Bld: 134 mg/dL — ABNORMAL HIGH (ref 70–99)
Potassium: 4.1 meq/L (ref 3.5–5.1)
Sodium: 141 meq/L (ref 135–145)
Total Bilirubin: 0.9 mg/dL (ref 0.2–1.2)
Total Protein: 6.3 g/dL (ref 6.0–8.3)

## 2024-02-18 LAB — LIPID PANEL
Cholesterol: 120 mg/dL (ref 0–200)
HDL: 31.7 mg/dL — ABNORMAL LOW (ref >39.00)
LDL Cholesterol: 52 mg/dL (ref 0–99)
NonHDL: 88.58
Total CHOL/HDL Ratio: 4
Triglycerides: 181 mg/dL — ABNORMAL HIGH (ref 0.0–149.0)
VLDL: 36.2 mg/dL (ref 0.0–40.0)

## 2024-02-18 LAB — CBC
HCT: 29.1 % — ABNORMAL LOW (ref 39.0–52.0)
Hemoglobin: 9.5 g/dL — ABNORMAL LOW (ref 13.0–17.0)
MCHC: 32.7 g/dL (ref 30.0–36.0)
MCV: 90 fl (ref 78.0–100.0)
Platelets: 95 K/uL — ABNORMAL LOW (ref 150.0–400.0)
RBC: 3.23 Mil/uL — ABNORMAL LOW (ref 4.22–5.81)
RDW: 14 % (ref 11.5–15.5)
WBC: 4.1 K/uL (ref 4.0–10.5)

## 2024-02-18 LAB — MICROALBUMIN / CREATININE URINE RATIO
Creatinine,U: 111 mg/dL
Microalb Creat Ratio: 27.8 mg/g (ref 0.0–30.0)
Microalb, Ur: 3.1 mg/dL — ABNORMAL HIGH (ref 0.0–1.9)

## 2024-02-18 LAB — CYTOLOGY - NON PAP

## 2024-02-18 NOTE — Assessment & Plan Note (Signed)
 Improved with A1c at 8.3 recently.  Continue Farxiga  10 mg daily for now. Await further treatment based on pancreas mass biopsy.   Referral placed for diabetes education.

## 2024-02-18 NOTE — Patient Instructions (Addendum)
 Stop by the lab prior to leaving today. I will notify you of your results once received.   You will either be contacted via phone regarding your referral to diabetes education, or you may receive a letter on your MyChart portal from our referral team with instructions for scheduling an appointment. Please let us  know if you have not been contacted by anyone within two weeks.  Start checking your blood sugar levels.  Appropriate times to check your blood sugar levels are:  -Before any meal (breakfast, lunch, dinner) -Two hours after any meal (breakfast, lunch, dinner) -Bedtime  Record your readings and notify me if you continue to consistently run at or above 150. Diabetes: Healthy Eating for Adults When you have diabetes, also called diabetes mellitus, it's important to have healthy eating habits. Your blood sugar (glucose) levels are greatly affected by what you eat and drink. You need to eat healthy foods in the right amounts, at about the same times each day. Doing this can help you: Manage your blood sugar. Lower your risk of heart disease. Improve your blood pressure. Reach or stay at a healthy weight. What can affect my meal plan? Every person with diabetes is different. And each person has different needs for a meal plan. Your health care provider may suggest that you work with an expert in healthy eating called a dietitian. They can help you make a meal plan that's best for you. How do carbohydrates affect me? Carbohydrates, also called carbs, affect your blood sugar level more than any other type of food. Eating carbs raises the amount of sugar in your blood. It's important to know how many carbs you can safely have in each meal. This is different for every person. Your dietitian can help you calculate how many carbs you should have at each meal and for each snack. How does alcohol affect me? Alcohol can cause a decrease in blood sugar (hypoglycemia), especially if you use insulin  or  take certain diabetes medicines by mouth. Hypoglycemia can be a life-threatening condition. Symptoms of hypoglycemia are similar to those of having too much alcohol. They include confusion, being sleepy, and feeling dizzy. Do not drink alcohol if: Your provider tells you not to drink. You're pregnant, may be pregnant, or plan to become pregnant. What are tips for following this plan? Reading food labels Start by checking the serving size on the Nutrition Facts label of packaged foods and drinks. The number of calories and the amount of carbs, fats, and other nutrients listed on the label are based on one serving of the item. Many items contain more than one serving per package. Check the total grams (g) of carbs in one serving. Check the number of grams of saturated fats and trans fats in one serving. Choose foods that have a low amount or none of these fats. Check the number of milligrams (mg) of salt (sodium) in one serving. Most people should limit their total sodium intake to less than 2,300 mg per day. Always check the nutrition information of foods labeled as low-fat or nonfat. These foods may be higher in added sugar or refined carbs and should be avoided. Talk to your dietitian to identify your daily goals for nutrients listed on the label. Shopping Avoid buying canned, pre-made, or processed foods. These foods tend to be high in fat, sodium, and added sugar. Shop around the outside edge of the grocery store. This is where you'll most often find fresh fruits and vegetables, bulk grains, fresh meats,  and fresh dairy products. Cooking Use low-heat cooking methods, such as baking, instead of high-heat methods like deep frying. Cook using healthy oils, such as olive, canola, or sunflower oil. Avoid cooking with butter, cream, or high-fat meats. Meal planning  Eat meals and snacks regularly. Try to eat them at the same times every day. Avoid going too long without eating. Eat foods that  are high in fiber, such as fresh fruits, vegetables, beans, and whole grains. Eat 4-6 oz (112-168 g) of lean protein each day, such as lean meat, chicken, fish, eggs, or tofu. One ounce (oz) (28 g) of lean protein is equal to: 1 oz (28 g) of meat, chicken, or fish. 1 egg.  cup (62 g) of tofu. Eat some foods each day that contain healthy fats, such as avocado, nuts, seeds, and fish. What foods should I eat? Fruits Berries. Apples. Oranges. Peaches. Apricots. Plums. Grapes. Mangoes. Papayas. Pomegranates. Kiwi. Cherries. Vegetables Leafy greens, including lettuce, spinach, kale, chard, collard greens, mustard greens, and cabbage. Beets. Cauliflower. Broccoli. Carrots. Green beans. Tomatoes. Peppers. Onions. Cucumbers. Brussels sprouts. Grains Whole grains, such as whole-wheat or whole-grain bread, crackers, tortillas, cereal, and pasta. Unsweetened oatmeal. Quinoa. Brown or wild rice. Meats and other proteins Seafood. Poultry without skin. Lean cuts of poultry and beef. Tofu. Nuts. Seeds. Dairy Low-fat or fat-free dairy products such as milk, yogurt, and cheese. The items listed above may not be all the foods and drinks you can have. Talk with a dietitian to learn more. What foods should I avoid? Fruits Fruits canned with syrup. Vegetables Canned vegetables. Frozen vegetables with butter or cream sauce. Grains Refined white flour and flour products such as bread, pasta, snack foods, and cereals. Avoid all processed foods. Meats and other proteins Fatty cuts of meat. Poultry with skin. Breaded or fried meats. Processed meat. Avoid saturated fats. Dairy Full-fat yogurt, cheese, or milk. Beverages Sweetened drinks, such as soda or iced tea. The items listed above may not be all the foods and drinks you should avoid. Talk with a dietitian to learn more. Where to find more information: To learn more, go to: Academy of Nutrition and Dietetics at deathprevention.it. Click Search and type  diabetes. Find the link you need. Centers for Disease Control and Prevention at tonerpromos.no. Click Search and type diabetes. Find the link you need. American Diabetes Association: diabetes.org/food-nutrition [employer] of Diabetes and Digestive and Kidney Diseases: stagesync.si This information is not intended to replace advice given to you by your health care provider. Make sure you discuss any questions you have with your health care provider. Document Revised: 02/19/2023 Document Reviewed: 02/19/2023 Elsevier Patient Education  [employer].  It was a pleasure to see you today!

## 2024-02-18 NOTE — Assessment & Plan Note (Signed)
 Above goal in the office today, improved upon recheck.  Recommended that he continue to hold his blood pressure medications for now but to monitor pressure at home.  If his blood pressure remains consistently at or above 140/90 to start resuming blood pressure medications.  He will consult me for instructions.

## 2024-02-18 NOTE — Assessment & Plan Note (Signed)
 Pallor noticed on exam today. Repeat CBC pending

## 2024-02-18 NOTE — Assessment & Plan Note (Signed)
 With recent hospitalization.  Hospital notes, labs, imaging reviewed.  Repeat CBC and CMP pending. Follow-up with hematology/oncology next week as scheduled.  Continue pantoprazole  40 mg daily. Follow-up with GI as scheduled

## 2024-02-18 NOTE — Assessment & Plan Note (Signed)
 Incidental finding from recent hospitalization.  Await pancreas biopsy results. Follow up with GI and hematology/oncology

## 2024-02-18 NOTE — Assessment & Plan Note (Signed)
 Repeat testosterone  level and CBC pending.  Continue testosterone  1.62% gel, 3 pumps daily.

## 2024-02-18 NOTE — Progress Notes (Signed)
 Subjective:    Patient ID: [name] [name], male    DOB: 10/04/1950, 73 y.o.   MRN: [id number]  [name] [name] is a very pleasant 73 y.o. male with a history of hypertension, atrial fibrillation, CHF, sleep apnea, upper GI bleed, type 2 diabetes, hyperlipidemia who presents today for hospital follow-up.  He initially presented to our office on 02/09/2024, evaluated by Dr. Rilla, for dark stools, fatigue, orthostatic lightheadedness.  He endorsed NSAID use while on Eliquis  for months.  He was advised to proceed to the ED for further workup of GI bleed.  [facility] presented to [facility] on 02/09/2024 for concern of GI bleed with dark stools for the previous 5 days, fatigue and lightheadedness.  During his stay in the ED his hemoglobin was noted to be 9.3 which was decreased from 15 one year prior.  He underwent CT angio abdomen which was negative for acute GI bleed but there was an incidental finding of a pancreatic mass.  Occult stool card was positive.  He was initiated on pantoprazole  twice daily and was admitted for further evaluation.  During his hospitalization his apixaban  was held.  Hematology/oncology consulted.  CEA 19-9 was elevated. He underwent biopsy of pancreatic mass on 02/10/2024.  GI also consulted, he underwent upper endoscopy also completed which was negative for ulcers, positive for gastritis. His BP medications were held due to hypotension.  He was discharged home on 02/10/2024 with a prescription for Protonix  once daily, outpatient GI follow-up, outpatient oncology follow-up.  It was also recommended his Eliquis  remain held until evaluated by GI.  Since his hospital stay he remains to feel fatigued and lightheaded. He underwent biopsy of pancreatic mass yesterday which is awaiting pathology. He has an appointment scheduled with oncology next week. He has yet to resume his amlodipine , metoprolol , and Diovan  due to hypotension. He has not recently checked his BP at home.   He would  like a referral to diabetes education.   BP Readings from Last 3 Encounters:  02/18/24 (!) 152/60  02/17/24 134/75  02/10/24 123/64     Review of Systems  Constitutional:  Positive for fatigue.  Respiratory:  Negative for shortness of breath.   Cardiovascular:  Negative for chest pain and palpitations.  Gastrointestinal:  Negative for blood in stool.  Skin:  Positive for pallor.  Neurological:  Positive for light-headedness.         Past Medical History:  Diagnosis Date   Atrial fibrillation (HCC)    Diabetes mellitus without complication (HCC)    Dyslipidemia    ED (erectile dysfunction)    Hyperlipidemia    Hyperparathyroidism    Hypertension    Hypogonadism, male    Sleep apnea     Social History   Socioeconomic History   Marital status: Married    Spouse name: Not on file   Number of children: 2   Years of education: Not on file   Highest education level: Master's degree (e.g., MA, MS, MEng, MEd, MSW, MBA)  Occupational History   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[employer]  Tobacco Use   Smoking status: Never   Smokeless tobacco: Never  Vaping Use   Vaping status: Never Used  Substance and Sexual Activity   Alcohol use: Yes    Alcohol/week: 14.0 standard drinks of alcohol    Types: 14 Cans of beer per week   Drug use: No   Sexual activity: Yes  Other Topics Concern   Not on file  Social  History Narrative   Not on file   Social Drivers of Health   Financial Resource Strain: Low Risk  (02/09/2024)   Overall Financial Resource Strain (CARDIA)    Difficulty of Paying Living Expenses: Not very hard  Food Insecurity: No Food Insecurity (02/09/2024)   Hunger Vital Sign    Worried About Running Out of Food in the Last Year: Never true    Ran Out of Food in the Last Year: Never true  Transportation Needs: No Transportation Needs (02/09/2024)   PRAPARE - Administrator, Civil Service (Medical): No    Lack of Transportation (Non-Medical): No   Physical Activity: Sufficiently Active (02/09/2024)   Exercise Vital Sign    Days of Exercise per Week: 3 days    Minutes of Exercise per Session: 50 min  Stress: No Stress Concern Present (02/09/2024)   [employer] of Occupational Health - Occupational Stress Questionnaire    Feeling of Stress: Only a little  Social Connections: Moderately Integrated (02/10/2024)   Social Connection and Isolation Panel    Frequency of Communication with Friends and Family: Three times a week    Frequency of Social Gatherings with Friends and Family: Three times a week    Attends Religious Services: More than 4 times per year    Active Member of Clubs or Organizations: No    Attends Banker Meetings: Never    Marital Status: Married  Catering Manager Violence: Not At Risk (02/09/2024)   Humiliation, Afraid, Rape, and Kick questionnaire    Fear of Current or Ex-Partner: No    Emotionally Abused: No    Physically Abused: No    Sexually Abused: No    Past Surgical History:  Procedure Laterality Date   ESOPHAGOGASTRODUODENOSCOPY N/A 02/10/2024   Procedure: EGD (ESOPHAGOGASTRODUODENOSCOPY);  Surgeon: [name], MD;  Location: [facility];  Service: Gastroenterology;  Laterality: N/A;   PARATHYROIDECTOMY     SKIN BIOPSY Left 03/02/2019   squamous cell carcinoma in situ, hypertrophic, traumatized   SKIN BIOPSY Right 08/01/2021   residual squamous cell caarcinoma    Family History  Problem Relation Age of Onset   Hypertension Father    Hyperlipidemia Father     Allergies  Allergen Reactions   Yellow Jacket Venom Anaphylaxis    Current Outpatient Medications on File Prior to Visit  Medication Sig Dispense Refill   Accu-Chek FastClix Lancets MISC 1 each by Does not apply route daily. 100 each 3   dapagliflozin  propanediol (FARXIGA ) 10 MG TABS tablet TAKE 1 TABLET (10 MG TOTAL) BY MOUTH DAILY BEFORE BREAKFAST. FOR DIABETES. (Patient taking differently: Take 10 mg by  mouth in the morning.) 90 tablet 0   EPINEPHrine  (EPI-PEN) 0.3 mg/0.3 mL DEVI Inject 0.3 mLs (0.3 mg total) into the muscle once as needed (for anaphylaxis).     gabapentin  (NEURONTIN ) 300 MG capsule Take 300 mg by mouth in the morning and at bedtime.     Glucosamine-Chondroit-Vit C-Mn (GLUCOSAMINE 1500 COMPLEX) CAPS Take 1 capsule by mouth daily.      glucose blood (ACCU-CHEK GUIDE) test strip Use one daily to check blood sugar 100 strip 12   HYDROcodone -acetaminophen  (NORCO/VICODIN) 5-325 MG tablet Take 1-2 tablets by mouth every 6 (six) hours as needed for moderate pain (pain score 4-6). 20 tablet 0   Iron , Ferrous Sulfate , 325 (65 Fe) MG TABS Take 65 mg by mouth daily with breakfast. 30 tablet 0   Multiple Vitamins-Minerals (MULTIVITAMIN WITH MINERALS) tablet Take 1 tablet by mouth  daily with breakfast.     pantoprazole  (PROTONIX ) 40 MG tablet Take 1 tablet (40 mg total) by mouth daily. 30 tablet 0   PRESCRIPTION MEDICATION CPAP- At bedtime     rosuvastatin  (CRESTOR ) 40 MG tablet Take 1 tablet (40 mg total) by mouth at bedtime.     tadalafil  (CIALIS ) 20 MG tablet Take 1 tablet (20 mg total) by mouth daily as needed for erectile dysfunction. 30 tablet 5   Testosterone  20.25 MG/ACT (1.62%) GEL APPLY 2 PUMPS TO THE SHOULDER AND ARM DAILY (Patient taking differently: Apply 2 Pump topically See admin instructions. APPLY 40.50 mg (2 PUMPS) TO A SHOULDER AND ARM DAILY- after the morning shower) 150 g 0   [START ON 02/19/2024] apixaban  (ELIQUIS ) 5 MG TABS tablet Take 1 tablet (5 mg total) by mouth 2 (two) times daily. (Patient not taking: Reported on 02/18/2024)     No current facility-administered medications on file prior to visit.    BP (!) 152/60   Pulse 61   Temp 97.9 F (36.6 C) (Oral)   Ht 5' 11 (1.803 m)   Wt 222 lb 8 oz (100.9 kg)   SpO2 99%   BMI 31.03 kg/m  Objective:   Physical Exam Cardiovascular:     Rate and Rhythm: Normal rate and regular rhythm.  Pulmonary:     Effort:  Pulmonary effort is normal.     Breath sounds: Normal breath sounds.  Musculoskeletal:     Cervical back: Neck supple.  Skin:    General: Skin is warm and dry.  Neurological:     Mental Status: He is alert and oriented to person, place, and time.  Psychiatric:        Mood and Affect: Mood normal.     Physical Exam        Assessment & Plan:  Pancreatic mass Assessment & Plan: Incidental finding from recent hospitalization.  Await pancreas biopsy results. Follow up with GI and hematology/oncology    Hypogonadism male Assessment & Plan: Repeat testosterone  level and CBC pending.  Continue testosterone  1.62% gel, 3 pumps daily.  Orders: -     Testosterone ,Free and Total  Essential hypertension Assessment & Plan: Above goal in the office today, improved upon recheck.  Recommended that he continue to hold his blood pressure medications for now but to monitor pressure at home.  If his blood pressure remains consistently at or above 140/90 to start resuming blood pressure medications.  He will consult me for instructions.  Orders: -     CBC -     Comprehensive metabolic panel with GFR  Type 2 diabetes mellitus with hyperglycemia, without long-term current use of insulin  (HCC) Assessment & Plan: Improved with A1c at 8.3 recently.  Continue Farxiga  10 mg daily for now. Await further treatment based on pancreas mass biopsy.   Referral placed for diabetes education.  Orders: -     Referral to Nutrition and Diabetes Services -     Microalbumin / creatinine urine ratio  Hyperlipidemia, unspecified hyperlipidemia type -     Lipid panel  Anemia, unspecified type -     CBC  Melena Assessment & Plan: With recent hospitalization.  Hospital notes, labs, imaging reviewed.  Repeat CBC and CMP pending. Follow-up with hematology/oncology next week as scheduled.  Continue pantoprazole  40 mg daily. Follow-up with GI as scheduled   Symptomatic anemia Assessment &  Plan: Pallor noticed on exam today. Repeat CBC pending     Assessment and Plan Assessment & Plan  Tena Linebaugh K Ferdinando Lodge, NP

## 2024-02-19 ENCOUNTER — Ambulatory Visit: Payer: Self-pay | Admitting: Primary Care

## 2024-02-19 LAB — TESTOSTERONE,FREE AND TOTAL
Testosterone, Free: 4.5 pg/mL — ABNORMAL LOW (ref 6.6–18.1)
Testosterone: 368 ng/dL (ref 264–916)

## 2024-02-22 ENCOUNTER — Encounter: Payer: Self-pay | Admitting: Hematology

## 2024-02-22 ENCOUNTER — Inpatient Hospital Stay

## 2024-02-22 ENCOUNTER — Inpatient Hospital Stay: Attending: Hematology | Admitting: Hematology

## 2024-02-22 ENCOUNTER — Other Ambulatory Visit: Payer: Self-pay

## 2024-02-22 ENCOUNTER — Other Ambulatory Visit: Payer: Self-pay | Admitting: Hematology

## 2024-02-22 VITALS — BP 134/68 | HR 71 | Temp 96.7°F | Resp 17 | Wt 226.0 lb

## 2024-02-22 DIAGNOSIS — E785 Hyperlipidemia, unspecified: Secondary | ICD-10-CM | POA: Insufficient documentation

## 2024-02-22 DIAGNOSIS — Z7901 Long term (current) use of anticoagulants: Secondary | ICD-10-CM | POA: Diagnosis not present

## 2024-02-22 DIAGNOSIS — D509 Iron deficiency anemia, unspecified: Secondary | ICD-10-CM | POA: Diagnosis not present

## 2024-02-22 DIAGNOSIS — E291 Testicular hypofunction: Secondary | ICD-10-CM | POA: Insufficient documentation

## 2024-02-22 DIAGNOSIS — C252 Malignant neoplasm of tail of pancreas: Secondary | ICD-10-CM

## 2024-02-22 DIAGNOSIS — R5383 Other fatigue: Secondary | ICD-10-CM | POA: Insufficient documentation

## 2024-02-22 DIAGNOSIS — E119 Type 2 diabetes mellitus without complications: Secondary | ICD-10-CM | POA: Insufficient documentation

## 2024-02-22 DIAGNOSIS — Z79899 Other long term (current) drug therapy: Secondary | ICD-10-CM | POA: Diagnosis not present

## 2024-02-22 DIAGNOSIS — Z5111 Encounter for antineoplastic chemotherapy: Secondary | ICD-10-CM | POA: Diagnosis present

## 2024-02-22 DIAGNOSIS — Z79890 Hormone replacement therapy (postmenopausal): Secondary | ICD-10-CM | POA: Diagnosis not present

## 2024-02-22 DIAGNOSIS — I4891 Unspecified atrial fibrillation: Secondary | ICD-10-CM | POA: Diagnosis not present

## 2024-02-22 DIAGNOSIS — D500 Iron deficiency anemia secondary to blood loss (chronic): Secondary | ICD-10-CM | POA: Diagnosis not present

## 2024-02-22 DIAGNOSIS — D696 Thrombocytopenia, unspecified: Secondary | ICD-10-CM | POA: Insufficient documentation

## 2024-02-22 DIAGNOSIS — G4733 Obstructive sleep apnea (adult) (pediatric): Secondary | ICD-10-CM | POA: Diagnosis not present

## 2024-02-22 DIAGNOSIS — C259 Malignant neoplasm of pancreas, unspecified: Secondary | ICD-10-CM | POA: Insufficient documentation

## 2024-02-22 DIAGNOSIS — Z7984 Long term (current) use of oral hypoglycemic drugs: Secondary | ICD-10-CM | POA: Diagnosis not present

## 2024-02-22 DIAGNOSIS — I10 Essential (primary) hypertension: Secondary | ICD-10-CM | POA: Diagnosis not present

## 2024-02-22 LAB — CBC WITH DIFFERENTIAL (CANCER CENTER ONLY)
Abs Immature Granulocytes: 0.01 K/uL (ref 0.00–0.07)
Basophils Absolute: 0 K/uL (ref 0.0–0.1)
Basophils Relative: 1 %
Eosinophils Absolute: 0.1 K/uL (ref 0.0–0.5)
Eosinophils Relative: 2 %
HCT: 30.3 % — ABNORMAL LOW (ref 39.0–52.0)
Hemoglobin: 9.7 g/dL — ABNORMAL LOW (ref 13.0–17.0)
Immature Granulocytes: 0 %
Lymphocytes Relative: 12 %
Lymphs Abs: 0.6 K/uL — ABNORMAL LOW (ref 0.7–4.0)
MCH: 29.3 pg (ref 26.0–34.0)
MCHC: 32 g/dL (ref 30.0–36.0)
MCV: 91.5 fL (ref 80.0–100.0)
Monocytes Absolute: 0.5 K/uL (ref 0.1–1.0)
Monocytes Relative: 10 %
Neutro Abs: 3.9 K/uL (ref 1.7–7.7)
Neutrophils Relative %: 75 %
Platelet Count: 106 K/uL — ABNORMAL LOW (ref 150–400)
RBC: 3.31 MIL/uL — ABNORMAL LOW (ref 4.22–5.81)
RDW: 13.4 % (ref 11.5–15.5)
WBC Count: 5.2 K/uL (ref 4.0–10.5)
nRBC: 0 % (ref 0.0–0.2)

## 2024-02-22 LAB — SAMPLE TO BLOOD BANK

## 2024-02-22 LAB — FERRITIN: Ferritin: 27 ng/mL (ref 24–336)

## 2024-02-22 NOTE — Progress Notes (Unsigned)
 PATIENT NAVIGATOR PROGRESS NOTE  Name: [name] [name] Date: 02/22/2024 MRN: [id number]  DOB: [birth year]   Reason for visit:  Hospital Follow-up with [name]  Comments:   Met with patient and his wife during his hospital follow-up appointment.  Patient was given [facility]'s binder containing information specific to his diagnosis.  Patient was given my direct contact information and was instructed to contact office with any questions or concerns.   Request sent to Pathology to see if MMR can be added on to pathology obtained on 12/3.    Time spent counseling/coordinating care: > 60 minutes

## 2024-02-22 NOTE — Progress Notes (Signed)
 [facility]   [phone] [phone]   Clinic Follow up Note   Patient Care Team: [name], NP as PCP - General (Internal Medicine) [name], MD as PCP - Cardiology (Cardiology) [name], MD as Consulting Physician (Gastroenterology) [name], RN as Oncology Nurse Navigator  Date of Service:  02/22/2024  CHIEF COMPLAINT: f/u of pancreatic cancer  CURRENT THERAPY:  Pending surgery versus neoadjuvant chemo  Oncology History   Pancreatic cancer [facility]) Adenocarcinoma in tail of pancreas, cT3N0M0 -Patient was admitted in early December 2025 for GI bleeding, CT scan incidentally showed a 5.8 cm mass in the tail of pancreas, no evidence of nodal or distant metastasis.  CT scan showed possible invasion to gastric wall. -He underwent a EUS biopsy which confirmed adenocarcinoma.  Tumor invades splenic artery, no other vascular involvement.  Assessment & Plan Malignant neoplasm of tail of pancreas Newly diagnosed pancreatic adenocarcinoma of the tail, 5.8 cm, with possible splenic invasion and proximity to the stomach. No metastatic disease on imaging. Tumor is potentially resectable; surgical candidacy and sequencing of therapy (surgery vs. neoadjuvant chemotherapy) pending multidisciplinary review and surgical evaluation. Prognosis is guarded, with estimated 30% 5-year survival with surgery and chemotherapy. High risk of recurrence due to infiltrative nature. Neoadjuvant chemotherapy may be recommended to reduce tumor size and recurrence risk, but final decision depends on surgical assessment. Surgery may require splenectomy, which is generally well tolerated at his age; increased risk of infection can be mitigated by vaccination. Immunotherapy is not standard unless MSI-high, which is rare; MSI testing will be performed if tissue is available. Risks and benefits of neoadjuvant chemotherapy vs. upfront surgery were  reviewed, including better tolerance of chemotherapy preoperatively and risk of deconditioning prior to surgery. Additional imaging (MRI) may be needed to assess for gastric invasion. - Referred to [name] and Dr. [name] (local upper GI surgeons) and to [facility] for surgical evaluation and second opinion. - Planned discussion of his case at GI tumor board next Wednesday. - Ordered MSI testing on biopsy specimen if sufficient tissue is available. - Discussed possibility of neoadjuvant chemotherapy (FOLFIRINOX regimen) prior to surgery, pending surgical assessment and tumor board recommendations. - Discussed need for possible splenectomy and perioperative vaccinations if spleen is removed. - Discussed need for additional imaging (MRI) if further assessment of gastric invasion is required. - Coordinated with surgeons and will follow up after surgical consultations to finalize treatment plan.  Anemia and iron  deficiency in the setting of malignancy Persistent anemia, likely multifactorial due to chronic disease, recent gastrointestinal bleeding, and iron  deficiency. Hemoglobin has improved since hospitalization. Currently on oral iron  and received IV iron  during hospitalization. Ongoing iron  supplementation planned to optimize hemoglobin prior to surgical intervention. - Ordered two additional doses of IV iron  at [facility] infusion center. - Advised to continue oral iron  once daily. - Monitor hemoglobin and iron  studies.  Thrombocytopenia Chronic mild thrombocytopenia for at least one year, with stable platelet count and no evidence of hepatic disease. No acute bleeding or symptoms. Etiology remains unclear. - Ordered additional laboratory workup to further evaluate thrombocytopenia.  Type 2 diabetes mellitus Type 2 diabetes with recent A1c of 8.0. Glycemic control is important for surgical recovery and may worsen with further pancreatic resection. Discussed relationship between pancreatic cancer and  worsening glycemic control. - Advised to continue current diabetes medications (including Farxiga ). - Reinforced importance of glycemic control, especially in the perioperative period.  Atrial fibrillation, anticoagulation management Atrial fibrillation, previously managed with  Eliquis , which was held due to recent gastrointestinal bleeding. Cardiologist is aware. Anticoagulation will require management around surgery and in the context of bleeding risk. Eliquis  will be held prior to surgery as needed. - Advised to follow up with cardiologist regarding resumption of Eliquis . - Discussed that Eliquis  will be held prior to surgery as needed.  Hypertension Hypertension. Antihypertensive medications were held during hospitalization and are being restarted gradually with primary care. Blood pressure may fluctuate during cancer treatment. - Advised to monitor blood pressure and resume antihypertensive medications as directed by primary care provider.  Plan - I reviewed his images, US  report, and the biopsy results and discussed the findings with patient and his wife in detail - Will refer him to pancreatobiliary surgeon in [place] and [facility] for surgical opinion, patient would like to see two different surgeons before surgery -Will review his case in our GI conference next week.  If neoadjuvant chemo is recommended, will proceed with port placement, chemo class, and plan to start chemo in about 2 weeks. - I will schedule 2 additional doses of Venofer 200 mg at the [facility] hospital   SUMMARY OF ONCOLOGIC HISTORY: Oncology History  Pancreatic cancer (HCC)  02/17/2024 Cancer Staging   Staging form: Exocrine Pancreas, AJCC 8th Edition - Clinical stage from 02/17/2024: Stage IIA (cT3, cN0, cM0) - Signed by [name], MD on 02/22/2024 Stage prefix: Initial diagnosis Total positive nodes: 0   02/22/2024 Initial Diagnosis   Pancreatic cancer [facility])      Discussed the use of AI scribe software for  clinical note transcription with the patient, who gave verbal consent to proceed.  History of Present Illness [name] [name] is a 73 year old male with newly diagnosed pancreatic adenocarcinoma who presents for post-hospital follow-up after initial diagnosis and workup.  He was recently hospitalized for gastrointestinal bleeding, during which imaging revealed a 5.8 cm mass in the tail of the pancreas with possible splenic and gastric invasion and no metastatic disease. Endoscopic ultrasound-guided fine needle aspiration confirmed pancreatic adenocarcinoma. He has had no further melena or overt gastrointestinal bleeding since discharge.  He reports mild fatigue and decreased energy that are improved compared to hospitalization. He has not had weight loss and notes recent weight gain. He denies abdominal pain, diarrhea, symptoms of pancreatic insufficiency, left-sided abdominal pain, claudication, or new masses.  Hemoglobin was 9.5 g/dL last week and 9.7 g/dL today. He is taking oral iron  and received two doses of intravenous iron  during hospitalization, with additional infusions planned. Platelets have been mildly decreased for at least one year. He denies abnormal bleeding, bruising, or other symptoms of thrombocytopenia.  He is not on anticoagulation for atrial fibrillation because of the recent gastrointestinal bleed. Antihypertensives are being gradually restarted. Diabetes is managed with Farxiga  and his most recent hemoglobin A1c is slightly above 8. He uses CPAP for obstructive sleep apnea and is tapering gabapentin  for prior severe back pain. Current medications also include pantoprazole , Crestor , and testosterone  gel.  He has abstained from alcohol since hospitalization and does not smoke or use recreational drugs. He is retired and lives in [place].     All other systems were reviewed with the patient and are negative.  MEDICAL HISTORY:  Past Medical History:  Diagnosis Date   Atrial  fibrillation (HCC)    Diabetes mellitus without complication (HCC)    Dyslipidemia    ED (erectile dysfunction)    Hyperlipidemia    Hyperparathyroidism    Hypertension    Hypogonadism, male  Sleep apnea     SURGICAL HISTORY: Past Surgical History:  Procedure Laterality Date   ESOPHAGOGASTRODUODENOSCOPY N/A 02/10/2024   Procedure: EGD (ESOPHAGOGASTRODUODENOSCOPY);  Surgeon: [name], MD;  Location: [facility];  Service: Gastroenterology;  Laterality: N/A;   ESOPHAGOGASTRODUODENOSCOPY N/A 02/17/2024   Procedure: EGD (ESOPHAGOGASTRODUODENOSCOPY);  Surgeon: Burnette Fallow, MD;  Location: [facility];  Service: Gastroenterology;  Laterality: N/A;   EUS N/A 02/17/2024   Procedure: ULTRASOUND, UPPER GI TRACT, ENDOSCOPIC;  Surgeon: Burnette Fallow, MD;  Location: [facility];  Service: Gastroenterology;  Laterality: N/A;  fine needle aspiration   FINE NEEDLE ASPIRATION BIOPSY  02/17/2024   Procedure: FINE NEEDLE ASPIRATION BIOPSY;  Surgeon: Burnette Fallow, MD;  Location: [facility];  Service: Gastroenterology;;   PARATHYROIDECTOMY     SKIN BIOPSY Left 03/02/2019   squamous cell carcinoma in situ, hypertrophic, traumatized   SKIN BIOPSY Right 08/01/2021   residual squamous cell caarcinoma    I have reviewed the social history and family history with the patient and they are unchanged from previous note.  ALLERGIES:  is allergic to yellow jacket venom.  MEDICATIONS:  Current Outpatient Medications  Medication Sig Dispense Refill   Accu-Chek FastClix Lancets MISC 1 each by Does not apply route daily. 100 each 3   dapagliflozin  propanediol (FARXIGA ) 10 MG TABS tablet TAKE 1 TABLET (10 MG TOTAL) BY MOUTH DAILY BEFORE BREAKFAST. FOR DIABETES. 90 tablet 0   EPINEPHrine  (EPI-PEN) 0.3 mg/0.3 mL DEVI Inject 0.3 mLs (0.3 mg total) into the muscle once as needed (for anaphylaxis).     gabapentin  (NEURONTIN ) 300 MG capsule Take 300 mg by mouth in the morning and at bedtime.      Glucosamine-Chondroit-Vit C-Mn (GLUCOSAMINE 1500 COMPLEX) CAPS Take 1 capsule by mouth daily.      glucose blood (ACCU-CHEK GUIDE) test strip Use one daily to check blood sugar 100 strip 12   HYDROcodone -acetaminophen  (NORCO/VICODIN) 5-325 MG tablet Take 1-2 tablets by mouth every 6 (six) hours as needed for moderate pain (pain score 4-6). 20 tablet 0   Iron , Ferrous Sulfate , 325 (65 Fe) MG TABS Take 65 mg by mouth daily with breakfast. 30 tablet 0   Multiple Vitamins-Minerals (MULTIVITAMIN WITH MINERALS) tablet Take 1 tablet by mouth daily with breakfast.     pantoprazole  (PROTONIX ) 40 MG tablet Take 1 tablet (40 mg total) by mouth daily. 30 tablet 0   PRESCRIPTION MEDICATION CPAP- At bedtime     rosuvastatin  (CRESTOR ) 40 MG tablet Take 1 tablet (40 mg total) by mouth at bedtime.     tadalafil  (CIALIS ) 20 MG tablet Take 1 tablet (20 mg total) by mouth daily as needed for erectile dysfunction. 30 tablet 5   Testosterone  20.25 MG/ACT (1.62%) GEL APPLY 2 PUMPS TO THE SHOULDER AND ARM DAILY 150 g 0   apixaban  (ELIQUIS ) 5 MG TABS tablet Take 1 tablet (5 mg total) by mouth 2 (two) times daily. (Patient not taking: Reported on 02/22/2024)     No current facility-administered medications for this visit.    PHYSICAL EXAMINATION: ECOG PERFORMANCE STATUS: 1 - Symptomatic but completely ambulatory  Vitals:   02/22/24 1509  BP: 134/68  Pulse: 71  Resp: 17  Temp: (!) 96.7 F (35.9 C)  SpO2: 100%   Wt Readings from Last 3 Encounters:  02/22/24 226 lb (102.5 kg)  02/18/24 222 lb 8 oz (100.9 kg)  02/17/24 218 lb (98.9 kg)     GENERAL:alert, no distress and comfortable SKIN: skin color, texture, turgor are normal, no rashes or significant  lesions EYES: normal, Conjunctiva are pink and non-injected, sclera clear NECK: supple, thyroid normal size, non-tender, without nodularity LYMPH:  no palpable lymphadenopathy in the cervical, axillary  LUNGS: clear to auscultation and percussion with normal  breathing effort HEART: regular rate & rhythm and no murmurs and no lower extremity edema ABDOMEN:abdomen soft, non-tender and normal bowel sounds Musculoskeletal:no cyanosis of digits and no clubbing  NEURO: alert & oriented x 3 with fluent speech, no focal motor/sensory deficits  Physical Exam   LABORATORY DATA:  I have reviewed the data as listed    Latest Ref Rng & Units 02/22/2024    2:43 PM 02/18/2024   12:44 PM 02/10/2024    2:27 PM  CBC  WBC 4.0 - 10.5 K/uL 5.2  4.1  4.0   Hemoglobin 13.0 - 17.0 g/dL 9.7  9.5  8.5   Hematocrit 39.0 - 52.0 % 30.3  29.1  26.9   Platelets 150 - 400 K/uL 106  95.0  98         Latest Ref Rng & Units 02/18/2024   12:44 PM 02/10/2024    2:15 AM 02/09/2024    2:30 PM  CMP  Glucose 70 - 99 mg/dL 865  863  808   BUN 6 - 23 mg/dL 24  37  44   Creatinine 0.40 - 1.50 mg/dL 8.48  8.39  8.18   Sodium 135 - 145 mEq/L 141  138  136   Potassium 3.5 - 5.1 mEq/L 4.1  3.8  4.6   Chloride 96 - 112 mEq/L 106  105  101   CO2 19 - 32 mEq/L 27  22  23    Calcium  8.4 - 10.5 mg/dL 9.2  9.1  89.8   Total Protein 6.0 - 8.3 g/dL 6.3  5.7  7.1   Total Bilirubin 0.2 - 1.2 mg/dL 0.9  0.9  1.1   Alkaline Phos 39 - 117 U/L 40  39  47   AST 0 - 37 U/L 16  20  28    ALT 0 - 53 U/L 14  17  22        RADIOGRAPHIC STUDIES: I have personally reviewed the radiological images as listed and agreed with the findings in the report. No results found.    Orders Placed This Encounter  Procedures   Ambulatory referral to General Surgery    Referral Priority:   Routine    Referral Type:   Surgical    Referral Reason:   Specialty Services Required    Requested Specialty:   General Surgery    Number of Visits Requested:   1   Ambulatory referral to General Surgery    Referral Priority:   Routine    Referral Type:   Surgical    Referral Reason:   Specialty Services Required    Requested Specialty:   General Surgery    Number of Visits Requested:   1   All questions  were answered. The patient knows to call the clinic with any problems, questions or concerns. No barriers to learning was detected. The total time spent in the appointment was 60 minutes, including review of chart and various tests results, discussions about plan of care and coordination of care plan     Onita Mattock, MD 02/22/2024

## 2024-02-22 NOTE — Assessment & Plan Note (Signed)
 Adenocarcinoma in tail of pancreas, cT3N0M0 -Patient was admitted in early December 2025 for GI bleeding, CT scan incidentally showed a 5.8 cm mass in the tail of pancreas, no evidence of nodal or distant metastasis.  CT scan showed possible invasion to gastric wall. -He underwent a EUS biopsy which confirmed adenocarcinoma.  Tumor invades splenic artery, no other vascular involvement.

## 2024-02-23 ENCOUNTER — Ambulatory Visit: Admitting: Primary Care

## 2024-02-23 ENCOUNTER — Encounter: Payer: Self-pay | Admitting: Cardiovascular Disease

## 2024-02-23 ENCOUNTER — Encounter: Payer: Self-pay | Admitting: Primary Care

## 2024-02-23 ENCOUNTER — Ambulatory Visit

## 2024-02-23 VITALS — BP 118/78 | HR 72 | Temp 98.6°F | Resp 18 | Ht 71.0 in | Wt 223.4 lb

## 2024-02-23 VITALS — BP 118/78 | HR 72 | Temp 98.6°F | Ht 71.0 in | Wt 223.0 lb

## 2024-02-23 DIAGNOSIS — I10 Essential (primary) hypertension: Secondary | ICD-10-CM | POA: Diagnosis not present

## 2024-02-23 DIAGNOSIS — Z23 Encounter for immunization: Secondary | ICD-10-CM

## 2024-02-23 DIAGNOSIS — N529 Male erectile dysfunction, unspecified: Secondary | ICD-10-CM | POA: Diagnosis not present

## 2024-02-23 DIAGNOSIS — E785 Hyperlipidemia, unspecified: Secondary | ICD-10-CM | POA: Diagnosis not present

## 2024-02-23 DIAGNOSIS — E291 Testicular hypofunction: Secondary | ICD-10-CM

## 2024-02-23 DIAGNOSIS — C801 Malignant (primary) neoplasm, unspecified: Secondary | ICD-10-CM | POA: Diagnosis not present

## 2024-02-23 DIAGNOSIS — E1165 Type 2 diabetes mellitus with hyperglycemia: Secondary | ICD-10-CM

## 2024-02-23 DIAGNOSIS — I4891 Unspecified atrial fibrillation: Secondary | ICD-10-CM | POA: Diagnosis not present

## 2024-02-23 DIAGNOSIS — C252 Malignant neoplasm of tail of pancreas: Secondary | ICD-10-CM

## 2024-02-23 DIAGNOSIS — Z0000 Encounter for general adult medical examination without abnormal findings: Secondary | ICD-10-CM

## 2024-02-23 DIAGNOSIS — Z7984 Long term (current) use of oral hypoglycemic drugs: Secondary | ICD-10-CM | POA: Diagnosis not present

## 2024-02-23 LAB — CANCER ANTIGEN 19-9: CA 19-9: 762 U/mL — ABNORMAL HIGH (ref 0–35)

## 2024-02-23 NOTE — Assessment & Plan Note (Addendum)
 Improved with A1c of 8.3 recently.  Continue Farxiga  10 mg daily. He will continue to work on lifestyle changes. Pneumonia vaccine provided today.  Repeat A1c in 3 months. Follow-up in 6 months.

## 2024-02-23 NOTE — Assessment & Plan Note (Signed)
 Rate and rhythm regular today.  Resume Eliquis  5 mg twice daily for stroke prevention.

## 2024-02-23 NOTE — Assessment & Plan Note (Signed)
Continue tadalafil 20mg as needed

## 2024-02-23 NOTE — Addendum Note (Signed)
 Addended by: Aarika Moon on: 02/23/2024 04:04 PM   Modules accepted: Orders

## 2024-02-23 NOTE — Patient Instructions (Signed)
 [name] [name],  Thank you for taking the time for your Medicare Wellness Visit. I appreciate your continued commitment to your health goals. Please review the care plan we discussed, and feel free to reach out if I can assist you further.  Please note that Annual Wellness Visits do not include a physical exam. Some assessments may be limited, especially if the visit was conducted virtually. If needed, we may recommend an in-person follow-up with your provider.  Ongoing Care Seeing your primary care provider every 3 to 6 months helps us  monitor your health and provide consistent, personalized care.   Referrals If a referral was made during today's visit and you haven't received any updates within two weeks, please contact the referred provider directly to check on the status.  Recommended Screenings:  Health Maintenance  Topic Date Due   Pneumococcal Vaccine for age over 105 (3 of 3 - PCV20 or PCV21) 11/11/2020   Medicare Annual Wellness Visit  03/26/2023   COVID-19 Vaccine (3 - Pfizer risk series) 12/23/2023   Complete foot exam   02/04/2024   Hemoglobin A1C  08/08/2024   Eye exam for diabetics  02/03/2025   Yearly kidney function blood test for diabetes  02/17/2025   Yearly kidney health urinalysis for diabetes  02/17/2025   DTaP/Tdap/Td vaccine (3 - Td or Tdap) 10/06/2027   Colon Cancer Screening  08/21/2032   Flu Shot  Completed   Hepatitis C Screening  Completed   Zoster (Shingles) Vaccine  Completed   Meningitis B Vaccine  Aged Out   Hepatitis B Vaccine  Discontinued       02/23/2024    2:27 PM  Advanced Directives  Does Patient Have a Medical Advance Directive? Yes  Type of Advance Directive Living will;Healthcare Power of Attorney  Copy of Healthcare Power of Attorney in Chart? No - copy requested    Vision: Annual vision screenings are recommended for early detection of glaucoma, cataracts, and diabetic retinopathy. These exams can also reveal signs of chronic conditions  such as diabetes and high blood pressure.  Dental: Annual dental screenings help detect early signs of oral cancer, gum disease, and other conditions linked to overall health, including heart disease and diabetes.

## 2024-02-23 NOTE — Assessment & Plan Note (Signed)
 Controlled.  Continue amlodipine 10 mg daily.

## 2024-02-23 NOTE — Progress Notes (Signed)
 Subjective:    Patient ID: [name] [name], male    DOB: [birth year], 73 y.o.   MRN: [id number]  [name] [name] is a very pleasant 73 y.o. male with a history of hypertension, atrial fibrillation, CHF, sleep apnea, newly diagnosed pancreatic cancer, type 2 diabetes, AKI, iron  deficiency anemia who presents today for follow-up of chronic conditions.  1) Type 2 Diabetes:  Current medications include: Farxiga  10 mg daily  Last A1C: 8.3 in November 2025, reduced from 9.4 in March 2025 Last Eye Exam: Up-to-date Last Foot Exam: Due Pneumonia Vaccination: Up-to-date Urine Microalbumin: Up-to-date Statin: Rosuvastatin   He is working to improve his diet by limiting sweets and carbs. He is drinking water. Active at home.    2) Pancreatic Cancer: New diagnosis after hospitalization in November 2025.  Currently following with oncology, last office visit was yesterday.  Plan is for MSI testing on biopsy specimen, potential chemotherapy prior to surgery, potential splenectomy.  Plan is pending discussion at GI tumor board next week. He has an appointment scheduled next week with general surgery.   3) Hypertension/Atrial Fibrillation: Currently managed on amlodipine  10 mg daily. He denies chest pain. He has yet to resume Eliquis  as he was waiting for this appointment. His onoclogist believe it's fine to resume his Eliquis .  He has seen a few irregular readings on his watch monitor.  BP Readings from Last 3 Encounters:  02/23/24 118/78  02/23/24 118/78  02/22/24 134/68   4) Hypogonadism: Currently managed on testosterone  gel for which he applies 2 pumps daily.  Also managed on tadalafil  20 mg as needed.  He would like to increase back up to 3 pumps daily based on recent testosterone  levels.  Review of Systems  Respiratory:  Negative for shortness of breath.   Cardiovascular:  Negative for chest pain.  Gastrointestinal:  Negative for blood in stool, constipation and diarrhea.  Neurological:  Negative  for headaches.         Past Medical History:  Diagnosis Date   Atrial fibrillation (HCC)    Cancer [facility])    Pancreas   Clotting disorder July 2024   Lower GI Bleed   Diabetes mellitus without complication (HCC)    Dyslipidemia    ED (erectile dysfunction)    Hyperlipidemia    Hyperparathyroidism    Hypertension    Hypogonadism, male    Melena 02/09/2024   Sleep apnea     Social History   Socioeconomic History   Marital status: Married    Spouse name: Olam   Number of children: 2   Years of education: Not on file   Highest education level: Master's degree (e.g., MA, MS, MEng, MEd, MSW, MBA)  Occupational History   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[employer]  Tobacco Use   Smoking status: Never   Smokeless tobacco: Never  Vaping Use   Vaping status: Never Used  Substance and Sexual Activity   Alcohol use: Yes    Alcohol/week: 14.0 standard drinks of alcohol    Types: 14 Cans of beer per week    Comment: quit in Dec 2025   Drug use: No   Sexual activity: Yes  Other Topics Concern   Not on file  Social History Narrative   Not on file   Social Drivers of Health   Financial Resource Strain: Low Risk  (02/23/2024)   Overall Financial Resource Strain (CARDIA)    Difficulty of Paying Living Expenses: Not hard at all  Food Insecurity: No Food Insecurity (02/23/2024)  Hunger Vital Sign    Worried About Running Out of Food in the Last Year: Never true    Ran Out of Food in the Last Year: Never true  Transportation Needs: No Transportation Needs (02/23/2024)   PRAPARE - Administrator, Civil Service (Medical): No    Lack of Transportation (Non-Medical): No  Physical Activity: Sufficiently Active (02/23/2024)   Exercise Vital Sign    Days of Exercise per Week: 3 days    Minutes of Exercise per Session: 50 min  Stress: No Stress Concern Present (02/23/2024)   [employer] of Occupational Health - Occupational Stress Questionnaire    Feeling of Stress:  Only a little  Social Connections: Moderately Integrated (02/23/2024)   Social Connection and Isolation Panel    Frequency of Communication with Friends and Family: Three times a week    Frequency of Social Gatherings with Friends and Family: Three times a week    Attends Religious Services: More than 4 times per year    Active Member of Clubs or Organizations: No    Attends Banker Meetings: Never    Marital Status: Married  Catering Manager Violence: Not At Risk (02/23/2024)   Humiliation, Afraid, Rape, and Kick questionnaire    Fear of Current or Ex-Partner: No    Emotionally Abused: No    Physically Abused: No    Sexually Abused: No    Past Surgical History:  Procedure Laterality Date   ESOPHAGOGASTRODUODENOSCOPY N/A 02/10/2024   Procedure: EGD (ESOPHAGOGASTRODUODENOSCOPY);  Surgeon: [name], MD;  Location: [facility];  Service: Gastroenterology;  Laterality: N/A;   ESOPHAGOGASTRODUODENOSCOPY N/A 02/17/2024   Procedure: EGD (ESOPHAGOGASTRODUODENOSCOPY);  Surgeon: Burnette Fallow, MD;  Location: [facility];  Service: Gastroenterology;  Laterality: N/A;   EUS N/A 02/17/2024   Procedure: ULTRASOUND, UPPER GI TRACT, ENDOSCOPIC;  Surgeon: Burnette Fallow, MD;  Location: [facility];  Service: Gastroenterology;  Laterality: N/A;  fine needle aspiration   FINE NEEDLE ASPIRATION BIOPSY  02/17/2024   Procedure: FINE NEEDLE ASPIRATION BIOPSY;  Surgeon: Burnette Fallow, MD;  Location[facility];  Service: Gastroenterology;;   PARATHYROIDECTOMY     SKIN BIOPSY Left 03/02/2019   squamous cell carcinoma in situ, hypertrophic, traumatized   SKIN BIOPSY Right 08/01/2021   residual squamous cell caarcinoma    Family History  Problem Relation Age of Onset   Hypertension Father    Hyperlipidemia Father    Heart attack Father     Allergies  Allergen Reactions   Yellow Jacket Venom Anaphylaxis    Current Outpatient Medications on File Prior to Visit  Medication  Sig Dispense Refill   Accu-Chek FastClix Lancets MISC 1 each by Does not apply route daily. 100 each 3   amLODipine  (NORVASC ) 10 MG tablet Take 10 mg by mouth daily.     apixaban  (ELIQUIS ) 5 MG TABS tablet Take 1 tablet (5 mg total) by mouth 2 (two) times daily.     dapagliflozin  propanediol (FARXIGA ) 10 MG TABS tablet TAKE 1 TABLET (10 MG TOTAL) BY MOUTH DAILY BEFORE BREAKFAST. FOR DIABETES. 90 tablet 0   EPINEPHrine  (EPI-PEN) 0.3 mg/0.3 mL DEVI Inject 0.3 mLs (0.3 mg total) into the muscle once as needed (for anaphylaxis).     gabapentin  (NEURONTIN ) 300 MG capsule Take 300 mg by mouth 1 day or 1 dose.     Glucosamine-Chondroit-Vit C-Mn (GLUCOSAMINE 1500 COMPLEX) CAPS Take 1 capsule by mouth daily.      glucose blood (ACCU-CHEK GUIDE) test strip Use one daily to check  blood sugar 100 strip 12   Iron , Ferrous Sulfate , 325 (65 Fe) MG TABS Take 65 mg by mouth daily with breakfast. 30 tablet 0   Multiple Vitamins-Minerals (MULTIVITAMIN WITH MINERALS) tablet Take 1 tablet by mouth daily with breakfast.     pantoprazole  (PROTONIX ) 40 MG tablet Take 1 tablet (40 mg total) by mouth daily. 30 tablet 0   PRESCRIPTION MEDICATION CPAP- At bedtime     rosuvastatin  (CRESTOR ) 40 MG tablet Take 1 tablet (40 mg total) by mouth at bedtime.     tadalafil  (CIALIS ) 20 MG tablet Take 1 tablet (20 mg total) by mouth daily as needed for erectile dysfunction. 30 tablet 5   Testosterone  20.25 MG/ACT (1.62%) GEL APPLY 2 PUMPS TO THE SHOULDER AND ARM DAILY 150 g 0   No current facility-administered medications on file prior to visit.    BP 118/78   Pulse 72   Temp 98.6 F (37 C) (Oral)   Ht 5' 11 (1.803 m)   Wt 223 lb (101.2 kg)   BMI 31.10 kg/m  Objective:   Physical Exam Cardiovascular:     Rate and Rhythm: Normal rate and regular rhythm.  Pulmonary:     Effort: Pulmonary effort is normal.     Breath sounds: Normal breath sounds.  Abdominal:     General: Bowel sounds are normal.     Palpations: Abdomen  is soft.     Tenderness: There is no abdominal tenderness.  Musculoskeletal:     Cervical back: Neck supple.  Skin:    General: Skin is warm and dry.  Neurological:     Mental Status: He is alert and oriented to person, place, and time.  Psychiatric:        Mood and Affect: Mood normal.     Physical Exam        Assessment & Plan:  Essential hypertension Assessment & Plan: Controlled.  Continue amlodipine  10 mg daily.   Atrial fibrillation, unspecified type [facility]) Assessment & Plan: Rate and rhythm regular today.  Resume Eliquis  5 mg twice daily for stroke prevention.   Malignant neoplasm of tail of pancreas [facility]) Assessment & Plan: New diagnosis, following with oncology.  Reviewed office notes from yesterday.  Proceed with surgical evaluation. Plan is being formed at this time.  Await GI tumor board discussions   Type 2 diabetes mellitus with hyperglycemia, without long-term current use of insulin  (HCC) Assessment & Plan: Improved with A1c of 8.3 recently.  Continue Farxiga  10 mg daily. He will continue to work on lifestyle changes. Pneumonia vaccine provided today.  Repeat A1c in 3 months. Follow-up in 6 months.  Orders: -     Hemoglobin A1c; Future  Hypogonadism male Assessment & Plan: Slight reduction in testosterone  levels.  Increase testosterone  to 3 pumps daily. Repeat testosterone  level in 3 months.  Orders: -     Testosterone ,Free and Total; Future  Hyperlipidemia, unspecified hyperlipidemia type Assessment & Plan: Controlled.  Continue rosuvastatin  40 mg daily.   Erectile dysfunction, unspecified erectile dysfunction type Assessment & Plan: Continue tadalafil  20 mg as needed.     Assessment and Plan Assessment & Plan         Comer MARLA Gaskins, NP

## 2024-02-23 NOTE — Assessment & Plan Note (Signed)
Controlled.  Continue rosuvastatin 40 mg daily.

## 2024-02-23 NOTE — Patient Instructions (Signed)
 Increase your testosterone  to 3 pumps daily.  Schedule a lab only appointment for 3 months to check diabetes levels and testosterone  levels.  Please schedule a follow up visit for 6 months for a diabetes check.  It was a pleasure to see you today!

## 2024-02-23 NOTE — Progress Notes (Signed)
 Chief Complaint  Patient presents with   Medicare Wellness     Subjective:   [name] [name] is a 73 y.o. male who presents for a Medicare Annual Wellness Visit.  Visit info / Clinical Intake: Medicare Wellness Visit Type:: Subsequent Annual Wellness Visit Persons participating in visit and providing information:: patient Medicare Wellness Visit Mode:: In-person (required for WTM) Interpreter Needed?: No Pre-visit prep was completed: yes AWV questionnaire completed by patient prior to visit?: no Living arrangements:: lives with spouse/significant other Patient's Overall Health Status Rating: good Typical amount of pain: none Does pain affect daily life?: no Are you currently prescribed opioids?: no  Dietary Habits and Nutritional Risks How many meals a day?: 2 Eats fruit and vegetables daily?: yes Most meals are obtained by: preparing own meals In the last 2 weeks, have you had any of the following?: none Diabetic:: (!) yes Any non-healing wounds?: no How often do you check your BS?: 0 Would you like to be referred to a Nutritionist or for Diabetic Management? : no (already referred:1st class 03/21/24)  Functional Status Activities of Daily Living (to include ambulation/medication): Independent Ambulation: Independent Medication Administration: Independent Home Management (perform basic housework or laundry): Independent Manage your own finances?: yes Primary transportation is: driving Concerns about vision?: no *vision screening is required for WTM* Concerns about hearing?: no  Fall Screening Falls in the past year?: 0 Number of falls in past year: 0 Was there an injury with Fall?: 0 Fall Risk Category Calculator: 0 Patient Fall Risk Level: Low Fall Risk  Fall Risk Patient at Risk for Falls Due to: No Fall Risks Fall risk Follow up: Falls evaluation completed; Education provided; Falls prevention discussed  Home and Transportation Safety: All rugs have non-skid  backing?: N/A, no rugs All stairs or steps have railings?: yes Grab bars in the bathtub or shower?: (!) no Have non-skid surface in bathtub or shower?: (!) no Good home lighting?: yes Regular seat belt use?: yes Hospital stays in the last year:: (!) yes How many hospital stays:: 2 Reason: Dec 2025 abdominal pain and tarry stools  Cognitive Assessment Difficulty concentrating, remembering, or making decisions? : no Will 6CIT or Mini Cog be Completed: yes What year is it?: 0 points What month is it?: 0 points Give patient an address phrase to remember (5 components): [address] [place]  About what time is it?: 0 points Count backwards from 20 to 1: 0 points Say the months of the year in reverse: 0 points Repeat the address phrase from earlier: 0 points 6 CIT Score: 0 points  Advance Directives (For Healthcare) Does Patient Have a Medical Advance Directive?: Yes Does patient want to make changes to medical advance directive?: No - Patient declined Type of Advance Directive: Living will; Healthcare Power of Attorney Copy of Healthcare Power of Attorney in Chart?: No - copy requested Copy of Living Will in Chart?: No - copy requested  Reviewed/Updated  Reviewed/Updated: Reviewed All (Medical, Surgical, Family, Medications, Allergies, Care Teams, Patient Goals)    Allergies (verified) Yellow jacket venom   Current Medications (verified) Outpatient Encounter Medications as of 02/23/2024  Medication Sig   Accu-Chek FastClix Lancets MISC 1 each by Does not apply route daily.   amLODipine  (NORVASC ) 10 MG tablet Take 10 mg by mouth daily.   dapagliflozin  propanediol (FARXIGA ) 10 MG TABS tablet TAKE 1 TABLET (10 MG TOTAL) BY MOUTH DAILY BEFORE BREAKFAST. FOR DIABETES.   EPINEPHrine  (EPI-PEN) 0.3 mg/0.3 mL DEVI Inject 0.3 mLs (0.3 mg  total) into the muscle once as needed (for anaphylaxis).   gabapentin  (NEURONTIN ) 300 MG capsule Take 300 mg by mouth 1 day or 1 dose.    Glucosamine-Chondroit-Vit C-Mn (GLUCOSAMINE 1500 COMPLEX) CAPS Take 1 capsule by mouth daily.    glucose blood (ACCU-CHEK GUIDE) test strip Use one daily to check blood sugar   Iron , Ferrous Sulfate , 325 (65 Fe) MG TABS Take 65 mg by mouth daily with breakfast.   Multiple Vitamins-Minerals (MULTIVITAMIN WITH MINERALS) tablet Take 1 tablet by mouth daily with breakfast.   pantoprazole  (PROTONIX ) 40 MG tablet Take 1 tablet (40 mg total) by mouth daily.   PRESCRIPTION MEDICATION CPAP- At bedtime   rosuvastatin  (CRESTOR ) 40 MG tablet Take 1 tablet (40 mg total) by mouth at bedtime.   tadalafil  (CIALIS ) 20 MG tablet Take 1 tablet (20 mg total) by mouth daily as needed for erectile dysfunction.   Testosterone  20.25 MG/ACT (1.62%) GEL APPLY 2 PUMPS TO THE SHOULDER AND ARM DAILY   apixaban  (ELIQUIS ) 5 MG TABS tablet Take 1 tablet (5 mg total) by mouth 2 (two) times daily. (Patient not taking: Reported on 02/23/2024)   HYDROcodone -acetaminophen  (NORCO/VICODIN) 5-325 MG tablet Take 1-2 tablets by mouth every 6 (six) hours as needed for moderate pain (pain score 4-6). (Patient not taking: Reported on 02/23/2024)   No facility-administered encounter medications on file as of 02/23/2024.    History: Past Medical History:  Diagnosis Date   Atrial fibrillation (HCC)    Cancer [facility])    Pancreas   Clotting disorder July 2024   Lower GI Bleed   Diabetes mellitus without complication (HCC)    Dyslipidemia    ED (erectile dysfunction)    Hyperlipidemia    Hyperparathyroidism    Hypertension    Hypogonadism, male    Sleep apnea    Past Surgical History:  Procedure Laterality Date   ESOPHAGOGASTRODUODENOSCOPY N/A 02/10/2024   Procedure: EGD (ESOPHAGOGASTRODUODENOSCOPY);  Surgeon: [name], MD;  Location: [facility];  Service: Gastroenterology;  Laterality: N/A;   ESOPHAGOGASTRODUODENOSCOPY N/A 02/17/2024   Procedure: EGD (ESOPHAGOGASTRODUODENOSCOPY);  Surgeon: Burnette Fallow, MD;  Location: [facility];  Service: Gastroenterology;  Laterality: N/A;   EUS N/A 02/17/2024   Procedure: ULTRASOUND, UPPER GI TRACT, ENDOSCOPIC;  Surgeon: Burnette Fallow, MD;  [facility];  Service: Gastroenterology;  Laterality: N/A;  fine needle aspiration   FINE NEEDLE ASPIRATION BIOPSY  02/17/2024   Procedure: FINE NEEDLE ASPIRATION BIOPSY;  Surgeon: Burnette Fallow, MD;  [facility];  Service: Gastroenterology;;   PARATHYROIDECTOMY     SKIN BIOPSY Left 03/02/2019   squamous cell carcinoma in situ, hypertrophic, traumatized   SKIN BIOPSY Right 08/01/2021   residual squamous cell caarcinoma   Family History  Problem Relation Age of Onset   Hypertension Father    Hyperlipidemia Father    Heart attack Father    Social History   Occupational History   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[employer]  Tobacco Use   Smoking status: Never   Smokeless tobacco: Never  Vaping Use   Vaping status: Never Used  Substance and Sexual Activity   Alcohol use: Yes    Alcohol/week: 14.0 standard drinks of alcohol    Types: 14 Cans of beer per week    Comment: quit in Dec 2025   Drug use: No   Sexual activity: Yes   Tobacco Counseling Counseling given: Not Answered  SDOH Screenings   Food Insecurity: No Food Insecurity (02/23/2024)  Housing: Low Risk  (02/23/2024)  Transportation Needs:  No Transportation Needs (02/23/2024)  Utilities: Not At Risk (02/23/2024)  Alcohol Screen: Low Risk  (02/23/2024)  Depression (PHQ2-9): Low Risk  (02/23/2024)  Financial Resource Strain: Low Risk  (02/23/2024)  Physical Activity: Sufficiently Active (02/23/2024)  Social Connections: Moderately Integrated (02/23/2024)  Stress: No Stress Concern Present (02/23/2024)  Tobacco Use: Low Risk  (02/23/2024)  Health Literacy: Adequate Health Literacy (02/23/2024)   See flowsheets for full screening details  Depression Screen PHQ 2 & 9 Depression Scale- Over the past 2 weeks, how often have you been bothered by any of  the following problems? Little interest or pleasure in doing things: 0 Feeling down, depressed, or hopeless (PHQ Adolescent also includes...irritable): 0 PHQ-2 Total Score: 0 Trouble falling or staying asleep, or sleeping too much: 0 Feeling tired or having little energy: 0 Poor appetite or overeating (PHQ Adolescent also includes...weight loss): 0 Feeling bad about yourself - or that you are a failure or have let yourself or your family down: 0 Trouble concentrating on things, such as reading the newspaper or watching television (PHQ Adolescent also includes...like school work): 0 Moving or speaking so slowly that other people could have noticed. Or the opposite - being so fidgety or restless that you have been moving around a lot more than usual: 0 Thoughts that you would be better off dead, or of hurting yourself in some way: 0 PHQ-9 Total Score: 0 If you checked off any problems, how difficult have these problems made it for you to do your work, take care of things at home, or get along with other people?: Somewhat difficult  Depression Treatment Depression Interventions/Treatment : Counseling     Goals Addressed             This Visit's Progress    I would like to get below 220       COMPLETED: Patient Stated       03/22/2021, wants to get to previous physical activity and lose 10 pounds     COMPLETED: Patient Stated       03/25/2022, wants to get weight below 225 pounds             Objective:    Today's Vitals   02/23/24 1418  BP: 118/78  Pulse: 72  Resp: 18  Temp: 98.6 F (37 C)  TempSrc: Oral  Weight: 223 lb 6.4 oz (101.3 kg)  Height: 5' 11 (1.803 m)   Body mass index is 31.16 kg/m.  Hearing/Vision screen Vision Screening - Comments:: UTD w/Dr Portia [phone]) Immunizations and Health Maintenance Health Maintenance  Topic Date Due   Pneumococcal Vaccine: 50+ Years (3 of 3 - PCV20 or PCV21) 11/11/2020   Medicare Annual Wellness (AWV)  03/26/2023    COVID-19 Vaccine (3 - Pfizer risk series) 12/23/2023   FOOT EXAM  02/04/2024   HEMOGLOBIN A1C  08/08/2024   OPHTHALMOLOGY EXAM  02/03/2025   Diabetic kidney evaluation - eGFR measurement  02/17/2025   Diabetic kidney evaluation - Urine ACR  02/17/2025   DTaP/Tdap/Td (3 - Td or Tdap) 10/06/2027   Colonoscopy  08/21/2032   Influenza Vaccine  Completed   Hepatitis C Screening  Completed   Zoster Vaccines- Shingrix  Completed   Meningococcal B Vaccine  Aged Out   Hepatitis B Vaccines 19-59 Average Risk  Discontinued        Assessment/Plan:  This is a routine wellness examination for [place].  Patient Care Team: [name], NP as PCP - General (Internal Medicine) [name], MD  as PCP - Cardiology (Cardiology) [name], MD as Consulting Physician (Gastroenterology) Ardis, Evalene CROME, RN as Oncology Nurse Navigator [name], MD as Consulting Physician (Hematology) [name], OD (Optometry)  I have personally reviewed and noted the following in the patient's chart:   Medical and social history Use of alcohol, tobacco or illicit drugs  Current medications and supplements including opioid prescriptions. Functional ability and status Nutritional status Physical activity Advanced directives List of other physicians Hospitalizations, surgeries, and ER visits in previous 12 months Vitals Screenings to include cognitive, depression, and falls Referrals and appointments  No orders of the defined types were placed in this encounter.  In addition, I have reviewed and discussed with patient certain preventive protocols, quality metrics, and best practice recommendations. A written personalized care plan for preventive services as well as general preventive health recommendations were provided to patient.   Erminio CROME Saris, LPN   [id number]    After Visit Summary: (MyChart) Due to this being a telephonic visit, the after visit summary with  patients personalized plan was offered to patient via MyChart   Nurse Notes: Pt has no concerns or questions. AWV/CPE made simultaneously for next/one year

## 2024-02-23 NOTE — Assessment & Plan Note (Signed)
 New diagnosis, following with oncology.  Reviewed office notes from yesterday.  Proceed with surgical evaluation. Plan is being formed at this time.  Await GI tumor board discussions

## 2024-02-23 NOTE — Assessment & Plan Note (Signed)
 Slight reduction in testosterone  levels.  Increase testosterone  to 3 pumps daily. Repeat testosterone  level in 3 months.

## 2024-02-24 ENCOUNTER — Encounter (HOSPITAL_COMMUNITY): Payer: Self-pay | Admitting: Hematology

## 2024-02-24 NOTE — Progress Notes (Signed)
 PATIENT NAVIGATOR PROGRESS NOTE  Name: [name] [name] Date: 02/24/2024 MRN: [id number]  DOB: 06-Oct-1950   Patient was referred for surgical evaluation at [facility] and is scheduled with [name] on 12/11.  During his initial appointment with [name] on 12/9, patient requested a second surgical referral be made to a tertiary center so that he can get a second surgical opinion.  Referral was faxed to [facility]--[name] and [name].    Received notification from pathology department that there is not enough tissue to add on MMR testing.  [name] made aware. No new orders at this time.    Time spent counseling/coordinating care: 30-45 minutes

## 2024-02-25 ENCOUNTER — Ambulatory Visit: Payer: Self-pay | Admitting: Surgery

## 2024-02-25 DIAGNOSIS — C252 Malignant neoplasm of tail of pancreas: Secondary | ICD-10-CM | POA: Diagnosis not present

## 2024-02-25 NOTE — H&P (View-Only) (Signed)
 "   REFERRING PHYSICIAN:  [name], MD PROVIDER:  LEONOR MACARIO DAWN, MD MRN: [id number] DOB: 09/25/1950 DATE OF ENCOUNTER: 02/25/2024 Subjective    Chief Complaint: New Consultation   History of Present Illness: [name] [name] is a 73 y.o. male who is seen today as an office consultation for evaluation of New Consultation  He was recently admitted with melena and was admitted. A CT scan during his workup showed a bulky mass in the tail of the pancreas. He had an EGD on 11/26 which did not show a clear source of bleeding. He then had an EUS on 12/3 by Dr. Burnette, which showed a 5.8cm mass in the tail of the pancreas, staged as a uT4N0. FNA confirmed adenocarcinoma. A chest CT did not show evidence of metastatic disease. CA19-9 was 762. He has been referred for surgical consultation. He denies any abdominal pain, loss of appetite, and unplanned weight loss.  He usually is on Eliquis  for a-fib but has not been taking it due to the GI bleeding. He has not had any prior surgeries and has no known family history of cancer.     Review of Systems: A complete review of systems was obtained from the patient.  I have reviewed this information and discussed as appropriate with the patient.  See HPI as well for other ROS.   Medical History: Past Medical History:  Diagnosis Date   Anemia    Diabetes mellitus without complication (CMS/HHS-HCC)    History of cancer    Hyperlipidemia    Sleep apnea     There is no problem list on file for this patient.   Past Surgical History:  Procedure Laterality Date   SPINE SURGERY       No Known Allergies  Current Outpatient Medications on File Prior to Visit  Medication Sig Dispense Refill   amLODIPine  (NORVASC ) 10 MG tablet Take 10 mg by mouth once daily for Blood Pressure     EPINEPHrine  (EPIPEN ) 0.3 mg/0.3 mL auto-injector Inject 0.3 mg into the muscle     FARXIGA  10 mg tablet TAKE 1 TABLET (10 MG TOTAL) BY MOUTH DAILY BEFORE BREAKFAST.  FOR DIABETES.     ferrous sulfate  325 (65 FE) MG tablet Take 65 mg by mouth     gabapentin  (NEURONTIN ) 300 MG capsule TAKE 1 CAPSULE BY MOUTH 2 TO 3 TIMES A DAY AS NEEDED     pantoprazole  (PROTONIX ) 40 MG DR tablet Take 40 mg by mouth once daily     testosterone  (ANDROGEL ) 20.25 mg/1.25 gram (1.62 %) gel in metered dose pump APPLY 2 PUMPS TO THE SHOULDER AND ARM DAILY     multivitamin with minerals tablet Take 1 tablet by mouth daily with breakfast     rosuvastatin  (CRESTOR ) 40 MG tablet Take 40 mg by mouth once daily     No current facility-administered medications on file prior to visit.    Family History  Problem Relation Age of Onset   Stroke Mother    High blood pressure (Hypertension) Father    Hyperlipidemia (Elevated cholesterol) Father    Coronary Artery Disease (Blocked arteries around heart) Father      Social History   Tobacco Use  Smoking Status Never  Smokeless Tobacco Never     Social History   Socioeconomic History   Marital status: Married  Tobacco Use   Smoking status: Never   Smokeless tobacco: Never  Substance and Sexual Activity   Drug use: Never   Social Drivers of  Health   Financial Resource Strain: Low Risk (02/23/2024)   Received from [facility]   Overall Financial Resource Strain (CARDIA)    How hard is it for you to pay for the very basics like food, housing, medical care, and heating?: Not hard at all  Food Insecurity: No Food Insecurity (02/23/2024)   Received from [facility]   Hunger Vital Sign    Within the past 12 months, you worried that your food would run out before you got the money to buy more.: Never true    Within the past 12 months, the food you bought just didn't last and you didn't have money to get more.: Never true  Transportation Needs: No Transportation Needs (02/23/2024)   Received from [facility] - Transportation    In the past 12 months, has lack of transportation kept you from medical  appointments or from getting medications?: No    In the past 12 months, has lack of transportation kept you from meetings, work, or from getting things needed for daily living?: No  Physical Activity: Sufficiently Active (02/23/2024)   Received from [facility]   Exercise Vital Sign    On average, how many days per week do you engage in moderate to strenuous exercise (like a brisk walk)?: 3 days    On average, how many minutes do you engage in exercise at this level?: 50 min  Stress: No Stress Concern Present (02/23/2024)   Received from [facility] - Occupational Stress Questionnaire    Do you feel stress - tense, restless, nervous, or anxious, or unable to sleep at night because your mind is troubled all the time - these days?: Only a little  Social Connections: Moderately Integrated (02/23/2024)   Received from [facility]   Social Connection and Isolation Panel    In a typical week, how many times do you talk on the phone with family, friends, or neighbors?: Three times a week    How often do you get together with friends or relatives?: Three times a week    How often do you attend church or religious services?: More than 4 times per year    Do you belong to any clubs or organizations such as church groups, unions, fraternal or athletic groups, or school groups?: No    How often do you attend meetings of the clubs or organizations you belong to?: Never    Are you married, widowed, divorced, separated, never married, or living with a partner?: Married  Housing Stability: Unknown (02/25/2024)   Housing Stability Vital Sign    Homeless in the Last Year: No    Objective:   Vitals:   02/25/24 1409  BP: 137/70  Pulse: 78  Temp: 36.7 C (98 F)  SpO2: 97%  Weight: (!) 101.6 kg (224 lb)  Height: 180.3 cm (5' 11)    Body mass index is 31.24 kg/m.  Physical Exam Vitals reviewed.  Constitutional:      General: He is not in acute  distress.    Appearance: Normal appearance.  HENT:     Head: Normocephalic and atraumatic.  Eyes:     General: No scleral icterus.    Conjunctiva/sclera: Conjunctivae normal.  Cardiovascular:     Rate and Rhythm: Normal rate and regular rhythm.  Pulmonary:     Effort: Pulmonary effort is normal. No respiratory distress.     Breath sounds: Normal breath sounds. No wheezing.  Abdominal:  General: There is no distension.     Palpations: Abdomen is soft.     Tenderness: There is no abdominal tenderness.  Skin:    General: Skin is warm and dry.     Coloration: Skin is not jaundiced.  Neurological:     General: No focal deficit present.     Mental Status: He is alert and oriented to person, place, and time.        Labs, Imaging and Diagnostic Testing: CT abd/pelvis 02/09/24: IMPRESSION: 1. No active GI bleeding. 2. Heterogeneously hypoenhancing mass arising from the tail of the pancreas with likely invasion of the anterior spleen and loss of the fat plane to the gastric greater curvature, measuring about 5.6 x 5.4 cm, likely pancreatic adenocarcinoma; focal pancreatitis with pseudocyst is a less likely consideration; new from prior study. 3. Focal high-grade stenosis of the left internal iliac artery with approximately 80 percent diameter reduction. 4. Cholelithiasis with multiple small stones layering in the gallbladder without inflammatory stranding. 5. Small periumbilical hernia containing fat with increased density suggesting fat necrosis, without bowel herniation.   Assessment and Plan:     Diagnoses and all orders for this visit:  Pancreatic adenocarcinoma (CMS/HHS-HCC)    73 yo male newly-diagnosed with pT4 pancreatic adenocarcinoma of the tail. I personally reviewed his labs, imaging, procedure notes and referral notes. The pancreatic mass is bulky and appears to involve the posterior wall of the stomach, and possibly the splenic flexure of the colon, although  it is difficult to determine without multiphasic contrast. Although this appears resectable, given the size and likely involvement of adjacent structures, I would favor neoadjuvant chemotherapy prior to surgery. I discussed this with the patient and his wife today, and offered portacath insertion. He is also getting a second surgical opinion at [facility] next week with [name]. Will tentatively schedule for port placement the following week, but will follow up after patient's appointment at [facility] next week.    SHELBY LYNN ALLEN, MD     "

## 2024-02-26 ENCOUNTER — Other Ambulatory Visit: Payer: Self-pay | Admitting: Genetic Counselor

## 2024-02-26 ENCOUNTER — Encounter: Payer: Self-pay | Admitting: Hematology

## 2024-02-26 ENCOUNTER — Telehealth: Payer: Self-pay | Admitting: Genetic Counselor

## 2024-02-26 DIAGNOSIS — C252 Malignant neoplasm of tail of pancreas: Secondary | ICD-10-CM

## 2024-02-26 NOTE — Telephone Encounter (Signed)
 POC testing was requested for pancreatic cancer.  Order was placed and TRF will be submitted.  Darice Monte, MS, CGC  Licensed, Patent Attorney [email or web address] phone: [phone]

## 2024-02-28 NOTE — Assessment & Plan Note (Signed)
 Adenocarcinoma in tail of pancreas, cT3N0M0 -Patient was admitted in early December 2025 for GI bleeding, CT scan incidentally showed a 5.8 cm mass in the tail of pancreas, no evidence of nodal or distant metastasis.  CT scan showed possible invasion to gastric wall. -He underwent a EUS biopsy which confirmed adenocarcinoma.  Tumor invades splenic artery, no other vascular involvement. -Pt was seen by surgeon [name]. We recommend neoadjuvant chemo

## 2024-02-29 ENCOUNTER — Inpatient Hospital Stay: Admitting: Hematology

## 2024-02-29 ENCOUNTER — Inpatient Hospital Stay

## 2024-02-29 VITALS — BP 131/74 | HR 60 | Temp 98.3°F | Resp 16 | Ht 71.0 in | Wt 224.9 lb

## 2024-02-29 DIAGNOSIS — C252 Malignant neoplasm of tail of pancreas: Secondary | ICD-10-CM

## 2024-02-29 DIAGNOSIS — Z5111 Encounter for antineoplastic chemotherapy: Secondary | ICD-10-CM | POA: Diagnosis not present

## 2024-02-29 LAB — CBC WITH DIFFERENTIAL (CANCER CENTER ONLY)
Abs Immature Granulocytes: 0.01 K/uL (ref 0.00–0.07)
Basophils Absolute: 0 K/uL (ref 0.0–0.1)
Basophils Relative: 1 %
Eosinophils Absolute: 0.1 K/uL (ref 0.0–0.5)
Eosinophils Relative: 3 %
HCT: 33 % — ABNORMAL LOW (ref 39.0–52.0)
Hemoglobin: 10.6 g/dL — ABNORMAL LOW (ref 13.0–17.0)
Immature Granulocytes: 0 %
Lymphocytes Relative: 14 %
Lymphs Abs: 0.6 K/uL — ABNORMAL LOW (ref 0.7–4.0)
MCH: 28.9 pg (ref 26.0–34.0)
MCHC: 32.1 g/dL (ref 30.0–36.0)
MCV: 89.9 fL (ref 80.0–100.0)
Monocytes Absolute: 0.4 K/uL (ref 0.1–1.0)
Monocytes Relative: 11 %
Neutro Abs: 2.8 K/uL (ref 1.7–7.7)
Neutrophils Relative %: 71 %
Platelet Count: 100 K/uL — ABNORMAL LOW (ref 150–400)
RBC: 3.67 MIL/uL — ABNORMAL LOW (ref 4.22–5.81)
RDW: 12.8 % (ref 11.5–15.5)
WBC Count: 3.9 K/uL — ABNORMAL LOW (ref 4.0–10.5)
nRBC: 0 % (ref 0.0–0.2)

## 2024-02-29 LAB — GENETIC SCREENING ORDER

## 2024-02-29 NOTE — Progress Notes (Signed)
 PATIENT NAVIGATOR PROGRESS NOTE  Name: [name] [name] Date: 02/29/2024 MRN: [id number]  DOB: 07-01-1950   Reason for visit:  Follow-up with [name]  Comments:   Met with patient and his wife during his follow-up appointment with [name].  Patient has been seen by [name] for surgical consult and will be seeing [name] on 12/19 for a second surgical opinion.  Per [name], recommendation is to do Neoadj chemo and port placement has been scheduled for 12/22.  Patient would prefer to do surgery first, if possible, and plans to discuss with [name].  If [name] also recommends chemo first, patient is agreeable with this plan. Patient was advised it would be best to tentatively schedule chemo education and first 2 rounds of chemo with plans to cancel the appointments if patient is a candidate for upfront surgery. Patient is also agreeable with this plan. Scheduling message sent to schedule chemo education and first 2 rounds of chemotherapy, starting the week of 12/29.     Time spent counseling/coordinating care: 30-45 minutes

## 2024-02-29 NOTE — Progress Notes (Signed)
 [facility]   Telephone:([phone] Fax:([phone]) [phone]   Clinic Follow up Note   Patient Care Team: [name], NP as PCP - General (Internal Medicine) [name], MD as PCP - Cardiology (Cardiology) [name], MD as Consulting Physician (Gastroenterology) [name], RN as Oncology Nurse Navigator [name], MD as Consulting Physician (Hematology) [name], OD (Optometry)  Date of Service:  02/29/2024  CHIEF COMPLAINT: f/u of pancreatic cancer   CURRENT THERAPY:  pending neoadjuvant chemotherapy  Oncology History   Pancreatic cancer [facility]) Adenocarcinoma in tail of pancreas, cT3N0M0 -Patient was admitted in early December 2025 for GI bleeding, CT scan incidentally showed a 5.8 cm mass in the tail of pancreas, no evidence of nodal or distant metastasis.  CT scan showed possible invasion to gastric wall. -He underwent a EUS biopsy which confirmed adenocarcinoma.  Tumor invades splenic artery, no other vascular involvement. -Pt was seen by surgeon [name]. We recommend neoadjuvant chemo   Assessment & Plan Stage IIa vs III malignant neoplasm of tail of pancreas Newly diagnosed, probable resectable stage IIa pancreatic adenocarcinoma of the tail of the pancreas. The tumor is large and adjacent to the stomach, raising concern for possible local invasion.  -He has seen pancreatic surgeon [name], who recommends neoadjuvant chemotherapy due to the bulky tumor and possible local invasion to stomach and colon.  He has a second surgical opinion at [facility] with [name] later this week, he would like to have upfront surgery if possible.   -He is eligible for intensive chemotherapy. Multidisciplinary management with surgical and medical oncology. Discussed neoadjuvant FOLFIRINOX chemotherapy to potentially shrink tumor, reduce risk of positive margins, and treat micrometastatic disease. Also discussed that chemotherapy may not  result in significant tumor shrinkage, and surgery-first remains an option if resectability is confirmed. Alternative regimen (gemcitabine and Abraxane) discussed for intolerance to first-line therapy. Anticipated outcomes: combined chemotherapy and surgery offer approximately 30% five-year, recurrence risk highest in first 2-3 years. He is actively engaged in decision-making and open to either treatment sequence per surgical oncology recommendations. - Educated regarding neoadjuvant FOLFIRINOX and alternative gemcitabine/Abraxane regimens, including rationale, expected benefits, and limitations. - Scheduled port placement for chemotherapy administration; to be canceled if surgery is pursued first. - Planned to communicate with surgical oncology ([name]) regarding preference for surgery-first approach and need for preoperative MRI. - Scheduled follow-up with surgical oncology to finalize treatment sequence. - Planned repeat imaging (CT or MRI) after 4-5 cycles of chemotherapy to assess response and resectability. - Planned surgery following neoadjuvant chemotherapy if tumor remains resectable and he maintains fitness. - Planned total of 6 months of chemotherapy (pre- and post-operative cycles as tolerated). - Planned serial monitoring of CA 19-9 tumor marker during treatment. - Discussed and planned genetic testing (germline and somatic), MSI testing if sufficient tissue available, and possible liquid biopsy (Guardant360) for molecular profiling and DPD enzyme status; recommended insurance coverage check due to potential cost. - Provided written chemotherapy education and information regarding regimens and side effects. - Scheduled initial chemotherapy cycle for the week after Christmas, with flexibility per surgical oncology recommendations.  Iron  deficiency anemia Iron  deficiency anemia associated with malignancy and planned chemotherapy. Previously treated with IV iron  (Venofer ) inpatient, now  receiving outpatient infusions. Tolerating infusions well. Management coordinated with ongoing cancer therapy. - Confirmed ongoing IV iron  infusions (Venofer  200 mg) scheduled for this week. - Advised spacing infusions by one day is acceptable.  Plan - I reviewed the role and  the benefit of neoadjuvant versus adjuvant chemotherapy with patient and his wife in great detail, patient is open to chemotherapy, but he strongly prefer to have surgery first if possible. - He is scheduled to see pancreatic surgeon [name] at [facility] later this week for second surgical opinion.  I will communicate with [name] to see if additional CT or MRI scan is needed to help determine if neoadjuvant chemo is needed. - He is tentatively scheduled for port insertion next Monday, and will plan to start neoadjuvant chemo FOLFIRINOX after Christmas if [name] also recommended neoadjuvant chemo.   SUMMARY OF ONCOLOGIC HISTORY: Oncology History  Pancreatic cancer (HCC)  02/17/2024 Cancer Staging   Staging form: Exocrine Pancreas, AJCC 8th Edition - Clinical stage from 02/17/2024: Stage IIA (cT3, cN0, cM0) - Signed by [name], MD on 02/29/2024 Stage prefix: Initial diagnosis Total positive nodes: 0   02/22/2024 Initial Diagnosis   Pancreatic cancer (HCC)   03/14/2024 -  Chemotherapy   Patient is on Treatment Plan : PANCREAS Modified FOLFIRINOX q14d x 4 cycles        Discussed the use of AI scribe software for clinical note transcription with the patient, who gave verbal consent to proceed.  History of Present Illness [name] [name] is a 73 year old male with newly diagnosed stage IIA pancreatic adenocarcinoma who presents for oncology follow-up and treatment planning.  He has pancreatic adenocarcinoma in the tail of the pancreas, staged IIA by tumor size and absence of lymph node involvement on imaging. The tumor is adjacent to the stomach with concern for local invasion. CA 19-9 is elevated at 762 and  will be followed during treatment.  He has no new symptoms, including no gastrointestinal bleeding, melena, abdominal pain, or change in oral intake, and he feels his strength is preserved.  He met with [name], who recommended neoadjuvant chemotherapy prior to surgery, and he has an upcoming surgical evaluation with Dr. Evalee at [facility]. He prefers upfront surgical resection.  He is interested in immunotherapy and genetic testing. Initial needle biopsy was insufficient for MSI testing. Blood was obtained today for genetic analysis, and he is considering Guardant360 liquid biopsy pending insurance approval. He tolerated topical 5FU in the past. He is scheduled for additional IV iron  infusions this week for iron  deficiency anemia in the setting of his malignancy and planned chemotherapy.     All other systems were reviewed with the patient and are negative.  MEDICAL HISTORY:  Past Medical History:  Diagnosis Date   Atrial fibrillation (HCC)    Cancer [facility])    Pancreas   Clotting disorder July 2024   Lower GI Bleed   Diabetes mellitus without complication (HCC)    Dyslipidemia    ED (erectile dysfunction)    Hyperlipidemia    Hyperparathyroidism    Hypertension    Hypogonadism, male    Melena 02/09/2024   Sleep apnea     SURGICAL HISTORY: Past Surgical History:  Procedure Laterality Date   ESOPHAGOGASTRODUODENOSCOPY N/A 02/10/2024   Procedure: EGD (ESOPHAGOGASTRODUODENOSCOPY);  Surgeon: [name], MD;  Location: [facility];  Service: Gastroenterology;  Laterality: N/A;   ESOPHAGOGASTRODUODENOSCOPY N/A 02/17/2024   Procedure: EGD (ESOPHAGOGASTRODUODENOSCOPY);  Surgeon: Burnette Fallow, MD;  Location: [facility];  Service: Gastroenterology;  Laterality: N/A;   EUS N/A 02/17/2024   Procedure: ULTRASOUND, UPPER GI TRACT, ENDOSCOPIC;  Surgeon: Burnette Fallow, MD;  Location: [facility];  Service: Gastroenterology;  Laterality: N/A;  fine needle aspiration   FINE NEEDLE  ASPIRATION BIOPSY  02/17/2024   Procedure: FINE NEEDLE ASPIRATION BIOPSY;  Surgeon: Burnette Fallow, MD;  Location: [facility];  Service: Gastroenterology;;   PARATHYROIDECTOMY     SKIN BIOPSY Left 03/02/2019   squamous cell carcinoma in situ, hypertrophic, traumatized   SKIN BIOPSY Right 08/01/2021   residual squamous cell caarcinoma    I have reviewed the social history and family history with the patient and they are unchanged from previous note.  ALLERGIES:  is allergic to yellow jacket venom.  MEDICATIONS:  Current Outpatient Medications  Medication Sig Dispense Refill   Accu-Chek FastClix Lancets MISC 1 each by Does not apply route daily. 100 each 3   amLODipine  (NORVASC ) 10 MG tablet Take 10 mg by mouth daily.     apixaban  (ELIQUIS ) 5 MG TABS tablet Take 1 tablet (5 mg total) by mouth 2 (two) times daily.     dapagliflozin  propanediol (FARXIGA ) 10 MG TABS tablet TAKE 1 TABLET (10 MG TOTAL) BY MOUTH DAILY BEFORE BREAKFAST. FOR DIABETES. 90 tablet 0   EPINEPHrine  (EPI-PEN) 0.3 mg/0.3 mL DEVI Inject 0.3 mLs (0.3 mg total) into the muscle once as needed (for anaphylaxis).     gabapentin  (NEURONTIN ) 300 MG capsule Take 300 mg by mouth 1 day or 1 dose.     Glucosamine-Chondroit-Vit C-Mn (GLUCOSAMINE 1500 COMPLEX) CAPS Take 1 capsule by mouth daily.      glucose blood (ACCU-CHEK GUIDE) test strip Use one daily to check blood sugar 100 strip 12   Iron , Ferrous Sulfate , 325 (65 Fe) MG TABS Take 65 mg by mouth daily with breakfast. 30 tablet 0   Multiple Vitamins-Minerals (MULTIVITAMIN WITH MINERALS) tablet Take 1 tablet by mouth daily with breakfast.     pantoprazole  (PROTONIX ) 40 MG tablet Take 1 tablet (40 mg total) by mouth daily. 30 tablet 0   PRESCRIPTION MEDICATION CPAP- At bedtime     rosuvastatin  (CRESTOR ) 40 MG tablet Take 1 tablet (40 mg total) by mouth at bedtime.     tadalafil  (CIALIS ) 20 MG tablet Take 1 tablet (20 mg total) by mouth daily as needed for erectile dysfunction.  30 tablet 5   Testosterone  20.25 MG/ACT (1.62%) GEL APPLY 2 PUMPS TO THE SHOULDER AND ARM DAILY 150 g 0   No current facility-administered medications for this visit.    PHYSICAL EXAMINATION: ECOG PERFORMANCE STATUS: 1 - Symptomatic but completely ambulatory  Vitals:   02/29/24 1100  BP: 131/74  Pulse: 60  Resp: 16  Temp: 98.3 F (36.8 C)  SpO2: 100%   Wt Readings from Last 3 Encounters:  02/29/24 224 lb 14.4 oz (102 kg)  02/23/24 223 lb (101.2 kg)  02/23/24 223 lb 6.4 oz (101.3 kg)     GENERAL:alert, no distress and comfortable SKIN: skin color, texture, turgor are normal, no rashes or significant lesions EYES: normal, Conjunctiva are pink and non-injected, sclera clear NECK: supple, thyroid normal size, non-tender, without nodularity LYMPH:  no palpable lymphadenopathy in the cervical, axillary  LUNGS: clear to auscultation and percussion with normal breathing effort HEART: regular rate & rhythm and no murmurs and no lower extremity edema ABDOMEN:abdomen soft, non-tender and normal bowel sounds Musculoskeletal:no cyanosis of digits and no clubbing  NEURO: alert & oriented x 3 with fluent speech, no focal motor/sensory deficits  Physical Exam    LABORATORY DATA:  I have reviewed the data as listed    Latest Ref Rng & Units 02/29/2024   10:46 AM 02/22/2024    2:43 PM 02/18/2024   12:44 PM  CBC  WBC 4.0 - 10.5 K/uL 3.9  5.2  4.1   Hemoglobin 13.0 - 17.0 g/dL 89.3  9.7  9.5   Hematocrit 39.0 - 52.0 % 33.0  30.3  29.1   Platelets 150 - 400 K/uL 100  106  95.0         Latest Ref Rng & Units 02/18/2024   12:44 PM 02/10/2024    2:15 AM 02/09/2024    2:30 PM  CMP  Glucose 70 - 99 mg/dL 865  863  808   BUN 6 - 23 mg/dL 24  37  44   Creatinine 0.40 - 1.50 mg/dL 8.48  8.39  8.18   Sodium 135 - 145 mEq/L 141  138  136   Potassium 3.5 - 5.1 mEq/L 4.1  3.8  4.6   Chloride 96 - 112 mEq/L 106  105  101   CO2 19 - 32 mEq/L 27  22  23    Calcium  8.4 - 10.5 mg/dL 9.2  9.1   89.8   Total Protein 6.0 - 8.3 g/dL 6.3  5.7  7.1   Total Bilirubin 0.2 - 1.2 mg/dL 0.9  0.9  1.1   Alkaline Phos 39 - 117 U/L 40  39  47   AST 0 - 37 U/L 16  20  28    ALT 0 - 53 U/L 14  17  22        RADIOGRAPHIC STUDIES: I have personally reviewed the radiological images as listed and agreed with the findings in the report. No results found.    Orders Placed This Encounter  Procedures   CBC with Differential (Cancer Center Only)    Standing Status:   Future    Expected Date:   03/14/2024    Expiration Date:   03/14/2025   CMP (Cancer Center only)    Standing Status:   Future    Expected Date:   03/14/2024    Expiration Date:   03/14/2025   CBC with Differential (Cancer Center Only)    Standing Status:   Future    Expected Date:   03/28/2024    Expiration Date:   03/28/2025   CMP (Cancer Center only)    Standing Status:   Future    Expected Date:   03/28/2024    Expiration Date:   03/28/2025   All questions were answered. The patient knows to call the clinic with any problems, questions or concerns. No barriers to learning was detected. The total time spent in the appointment was 60 minutes, including review of chart and various tests results, discussions about plan of care and coordination of care plan     Onita Mattock, MD 02/29/2024

## 2024-02-29 NOTE — Progress Notes (Signed)
 START ON PATHWAY REGIMEN - Pancreatic Adenocarcinoma     A cycle is every 14 days:     Irinotecan      Oxaliplatin      Leucovorin      Fluorouracil   **Always confirm dose/schedule in your pharmacy ordering system**  Patient Characteristics: Preoperative, M0 (Clinical Staging), Borderline Resectable, PS = 0,1, BRCA1/2 and PALB2 Mutation Absent/Unknown Therapeutic Status: Preoperative, M0 (Clinical Staging) AJCC T Category: cT3 AJCC N Category: cN0 AJCC M Category: cM0 AJCC 8 Stage Grouping: IIA ECOG Performance Status: 1 BRCA1/2 Mutation Status: Awaiting Test Results PALB2 Mutation Status: Awaiting Test Results Intent of Therapy: Curative Intent, Discussed with Patient

## 2024-03-01 ENCOUNTER — Other Ambulatory Visit: Payer: Self-pay

## 2024-03-01 ENCOUNTER — Ambulatory Visit (HOSPITAL_COMMUNITY)
Admission: RE | Admit: 2024-03-01 | Discharge: 2024-03-01 | Disposition: A | Discharge status: Home / Self Care | Source: Ambulatory Visit | Attending: Hematology | Admitting: Hematology

## 2024-03-01 ENCOUNTER — Telehealth: Payer: Self-pay | Admitting: Hematology

## 2024-03-01 VITALS — BP 131/75 | HR 58 | Temp 98.0°F | Resp 16

## 2024-03-01 DIAGNOSIS — D500 Iron deficiency anemia secondary to blood loss (chronic): Secondary | ICD-10-CM | POA: Insufficient documentation

## 2024-03-01 MED ORDER — IRON SUCROSE 200 MG IVPB - SIMPLE MED
200.0000 mg | Status: DC
  Administered 2024-03-01: 11:00:00 200 mg via INTRAVENOUS
  Filled 2024-03-01: qty 110

## 2024-03-01 MED ORDER — DIPHENHYDRAMINE HCL 25 MG PO CAPS: 25.0000 mg | ORAL_CAPSULE | Freq: Once | ORAL | Status: DC

## 2024-03-02 ENCOUNTER — Encounter (HOSPITAL_COMMUNITY): Payer: Self-pay

## 2024-03-02 ENCOUNTER — Other Ambulatory Visit: Payer: Self-pay

## 2024-03-02 MED ORDER — EPINEPHRINE 0.3 MG/0.3ML IJ SOAJ: 0.3000 mg | INTRAMUSCULAR | 0 refills | Status: AC | PRN

## 2024-03-02 NOTE — Progress Notes (Signed)
 The proposed treatment discussed in conference is for discussion purpose only and is not a binding recommendation.  The patients have not been physically examined, or presented with their treatment options.  Therefore, final treatment plans cannot be decided.

## 2024-03-02 NOTE — Pre-Procedure Instructions (Signed)
 Surgical Instructions   Your procedure is scheduled on March 07, 2024. Report to [facility] Main Entrance A at 5:30 A.M., then check in with the Admitting office. Any questions or running late day of surgery: call [phone]  Questions prior to your surgery date: call [phone], Monday-Friday, 8am-4pm. If you experience any cold or flu symptoms such as cough, fever, chills, shortness of breath, etc. between now and your scheduled surgery, please notify us  at the above number.     Remember:  Do not eat after midnight the night before your surgery   You may drink clear liquids until 4:30 AM the morning of your surgery.   Clear liquids allowed are: Water, Non-Citrus Juices (without pulp), Carbonated Beverages, Clear Tea (no milk, honey, etc.), Black Coffee Only (NO MILK, CREAM OR POWDERED CREAMER of any kind), and Gatorade.    Take these medicines the morning of surgery with A SIP OF WATER: amLODipine  (NORVASC )  gabapentin  (NEURONTIN )  pantoprazole  (PROTONIX )    May take these medicines IF NEEDED: EPINEPHrine  (EPI-PEN)    Follow your surgeon's instructions on when to stop apixaban  (ELIQUIS ).  If no instructions were given by your surgeon then you will need to call the office to get those instructions.     One week prior to surgery, STOP taking any Aspirin (unless otherwise instructed by your surgeon) Aleve, Naproxen, Ibuprofen, Motrin, Advil, Goody's, BC's, all herbal medications, fish oil, and non-prescription vitamins.   WHAT DO I DO ABOUT MY DIABETES MEDICATION?   STOP taking your dapagliflozin  propanediol (FARXIGA ) three days prior to surgery. Your last dose will be 18th.       HOW TO MANAGE YOUR DIABETES BEFORE AND AFTER SURGERY  Why is it important to control my blood sugar before and after surgery? Improving blood sugar levels before and after surgery helps healing and can limit problems. A way of improving blood sugar control is eating a healthy diet by:   Eating less sugar and carbohydrates  Increasing activity/exercise  Talking with your doctor about reaching your blood sugar goals High blood sugars (greater than 180 mg/dL) can raise your risk of infections and slow your recovery, so you will need to focus on controlling your diabetes during the weeks before surgery. Make sure that the doctor who takes care of your diabetes knows about your planned surgery including the date and location.  How do I manage my blood sugar before surgery? Check your blood sugar at least 4 times a day, starting 2 days before surgery, to make sure that the level is not too high or low.  Check your blood sugar the morning of your surgery when you wake up and every 2 hours until you get to the Short Stay unit.  If your blood sugar is less than 70 mg/dL, you will need to treat for low blood sugar: Do not take insulin . Treat a low blood sugar (less than 70 mg/dL) with  cup of clear juice (cranberry or apple), 4 glucose tablets, OR glucose gel. Recheck blood sugar in 15 minutes after treatment (to make sure it is greater than 70 mg/dL). If your blood sugar is not greater than 70 mg/dL on recheck, call [phone] for further instructions. Report your blood sugar to the short stay nurse when you get to Short Stay.  If you are admitted to the hospital after surgery: Your blood sugar will be checked by the staff and you will probably be given insulin  after surgery (instead of oral diabetes medicines) to make sure  you have good blood sugar levels. The goal for blood sugar control after surgery is 80-180 mg/dL.                      Do NOT Smoke (Tobacco/Vaping) for 24 hours prior to your procedure.  If you use a CPAP at night, you may bring your mask/headgear for your overnight stay.   You will be asked to remove any contacts, glasses, piercing's, hearing aid's, dentures/partials prior to surgery. Please bring cases for these items if needed.    Patients discharged  the day of surgery will not be allowed to drive home, and someone needs to stay with them for 24 hours.  SURGICAL WAITING ROOM VISITATION Patients may have no more than 2 support people in the waiting area - these visitors may rotate.   Pre-op nurse will coordinate an appropriate time for 1 ADULT support person, who may not rotate, to accompany patient in pre-op.  Children under the age of 17 must have an adult with them who is not the patient and must remain in the main waiting area with an adult.  If the patient needs to stay at the hospital during part of their recovery, the visitor guidelines for inpatient rooms apply.  Please refer to the [facility] website for the visitor guidelines for any additional information.   If you received a COVID test during your pre-op visit  it is requested that you wear a mask when out in public, stay away from anyone that may not be feeling well and notify your surgeon if you develop symptoms. If you have been in contact with anyone that has tested positive in the last 10 days please notify you surgeon.      Pre-operative CHG Bathing Instructions   You can play a key role in reducing the risk of infection after surgery. Your skin needs to be as free of germs as possible. You can reduce the number of germs on your skin by washing with CHG (chlorhexidine  gluconate) soap before surgery. CHG is an antiseptic soap that kills germs and continues to kill germs even after washing.   DO NOT use if you have an allergy to chlorhexidine /CHG or antibacterial soaps. If your skin becomes reddened or irritated, stop using the CHG and notify one of our RNs at [phone].              TAKE A SHOWER THE NIGHT BEFORE SURGERY   Please keep in mind the following:  DO NOT shave, including legs and underarms, 48 hours prior to surgery.   You may shave your face before/day of surgery.  Place clean sheets on your bed the night before surgery Use a clean washcloth (not used  since being washed) for shower. DO NOT sleep with pet's night before surgery.  CHG Shower Instructions:  Wash your face and private area with normal soap. If you choose to wash your hair, wash first with your normal shampoo.  After you use shampoo/soap, rinse your hair and body thoroughly to remove shampoo/soap residue.  Turn the water OFF and apply half the bottle of CHG soap to a CLEAN washcloth.  Apply CHG soap ONLY FROM YOUR NECK DOWN TO YOUR TOES (washing for 3-5 minutes)  DO NOT use CHG soap on face, private areas, open wounds, or sores.  Pay special attention to the area where your surgery is being performed.  If you are having back surgery, having someone wash your back for you may  be helpful. Wait 2 minutes after CHG soap is applied, then you may rinse off the CHG soap.  Pat dry with a clean towel  Put on clean pajamas    Additional instructions for the day of surgery: If you choose, you may shower the morning of surgery with an antibacterial soap.  DO NOT APPLY any lotions, deodorants, cologne, or perfumes.   Do not wear jewelry or makeup Do not wear nail polish, gel polish, artificial nails, or any other type of covering on natural nails (fingers and toes) Do not bring valuables to the hospital. [facility] is not responsible for valuables/personal belongings. Put on clean/comfortable clothes.  Please brush your teeth.  Ask your nurse before applying any prescription medications to the skin.

## 2024-03-03 ENCOUNTER — Encounter (HOSPITAL_COMMUNITY): Payer: Self-pay

## 2024-03-03 ENCOUNTER — Encounter: Payer: Self-pay | Admitting: Hematology

## 2024-03-03 ENCOUNTER — Other Ambulatory Visit: Payer: Self-pay

## 2024-03-03 ENCOUNTER — Encounter (HOSPITAL_COMMUNITY)
Admission: RE | Admit: 2024-03-03 | Discharge: 2024-03-03 | Disposition: A | Discharge status: Home / Self Care | Source: Ambulatory Visit | Attending: Hematology | Admitting: Hematology

## 2024-03-03 ENCOUNTER — Encounter: Payer: Self-pay | Admitting: Cardiovascular Disease

## 2024-03-03 ENCOUNTER — Encounter (HOSPITAL_COMMUNITY)
Admission: RE | Admit: 2024-03-03 | Discharge: 2024-03-03 | Disposition: A | Discharge status: Home / Self Care | Source: Ambulatory Visit | Attending: Surgery | Admitting: Surgery

## 2024-03-03 VITALS — BP 129/71 | HR 66 | Temp 98.0°F | Resp 16

## 2024-03-03 DIAGNOSIS — Z79899 Other long term (current) drug therapy: Secondary | ICD-10-CM | POA: Insufficient documentation

## 2024-03-03 DIAGNOSIS — G4733 Obstructive sleep apnea (adult) (pediatric): Secondary | ICD-10-CM | POA: Insufficient documentation

## 2024-03-03 DIAGNOSIS — D500 Iron deficiency anemia secondary to blood loss (chronic): Secondary | ICD-10-CM | POA: Diagnosis present

## 2024-03-03 DIAGNOSIS — C259 Malignant neoplasm of pancreas, unspecified: Secondary | ICD-10-CM | POA: Insufficient documentation

## 2024-03-03 DIAGNOSIS — E119 Type 2 diabetes mellitus without complications: Secondary | ICD-10-CM | POA: Insufficient documentation

## 2024-03-03 DIAGNOSIS — I10 Essential (primary) hypertension: Secondary | ICD-10-CM | POA: Insufficient documentation

## 2024-03-03 DIAGNOSIS — I4891 Unspecified atrial fibrillation: Secondary | ICD-10-CM | POA: Insufficient documentation

## 2024-03-03 DIAGNOSIS — K219 Gastro-esophageal reflux disease without esophagitis: Secondary | ICD-10-CM | POA: Insufficient documentation

## 2024-03-03 DIAGNOSIS — Z01812 Encounter for preprocedural laboratory examination: Secondary | ICD-10-CM | POA: Insufficient documentation

## 2024-03-03 HISTORY — DX: Cardiac arrhythmia, unspecified: I49.9

## 2024-03-03 HISTORY — DX: Atherosclerotic heart disease of native coronary artery without angina pectoris: I25.10

## 2024-03-03 LAB — GLUCOSE, CAPILLARY: Glucose-Capillary: 250 mg/dL — ABNORMAL HIGH (ref 70–99)

## 2024-03-03 MED ORDER — IRON SUCROSE 200 MG IVPB - SIMPLE MED
200.0000 mg | Status: DC
  Administered 2024-03-03: 12:00:00 200 mg via INTRAVENOUS
  Filled 2024-03-03: qty 200

## 2024-03-03 MED ORDER — DIPHENHYDRAMINE HCL 25 MG PO CAPS
ORAL_CAPSULE | ORAL | Status: AC
  Filled 2024-03-03: qty 1

## 2024-03-03 MED ORDER — DIPHENHYDRAMINE HCL 50 MG/ML IJ SOLN
INTRAMUSCULAR | Status: AC
  Filled 2024-03-03: qty 1

## 2024-03-03 MED ORDER — DIPHENHYDRAMINE HCL 25 MG PO CAPS: 25.0000 mg | ORAL_CAPSULE | Freq: Once | ORAL | Status: DC

## 2024-03-03 MED ORDER — DIPHENHYDRAMINE HCL 50 MG/ML IJ SOLN
25.0000 mg | Freq: Once | INTRAMUSCULAR | Status: AC
  Administered 2024-03-03: 11:00:00 25 mg via INTRAVENOUS

## 2024-03-03 NOTE — Progress Notes (Signed)
 Received call from Sari at [name] office stating last dose of Eliquis  would be on 03-04-24. Patient is aware of the instructions

## 2024-03-03 NOTE — Progress Notes (Signed)
 Spoke with Sari at Dr. Verla office regarding instructions for Eliquis . Patient has restarted medication but has not had medication since 02-29-24 that was last dose per patient.

## 2024-03-03 NOTE — Progress Notes (Addendum)
 PCP - [name], NP  Cardiologist - [name], MD  Oncology -[name],   PPM/ICD - denies Device Orders - n/a Rep Notified - n/a  Chest x-ray -  EKG - 02-09-24 Stress Test -  ECHO - 09-15-23 Cardiac Cath -   Sleep Study -  CPAP - nightly  Fasting Blood Sugar - Per patient blood sugars range around 170 Checks Blood Sugar  every other day. Blood sugar at PAT appointment 250. He reports he ate breakfast around 10:30 am. But also had ginger ale and crackers while receiving Iron  transfusion this afternoon. dapagliflozin  propanediol (FARXIGA ) last dose 03-03-24  Last dose of GLP1 agonist-  denies   Blood Thinner Instructions:apixaban  (ELIQUIS ) Per patient his last dose was on 03-01-24. Instructed patient to reach out to surgeon. He has not received any instructions Aspirin Instructions: denies  ERAS Protcol - clear liquids until 4:30 am.   COVID TEST- no   Anesthesia review: Yes Hx HTN, OSA, DM  Patient denies shortness of breath, fever, cough and chest pain at PAT appointment   All instructions explained to the patient, with a verbal understanding of the material. Patient agrees to go over the instructions while at home for a better understanding. Patient also instructed to self quarantine after being tested for COVID-19. The opportunity to ask questions was provided.

## 2024-03-04 ENCOUNTER — Encounter: Payer: Self-pay | Admitting: Hematology

## 2024-03-04 NOTE — Progress Notes (Signed)
 Pharmacist Chemotherapy Monitoring - Initial Assessment    Anticipated start date: 03/14/24   The following has been reviewed per standard work regarding the patient's treatment regimen: The patient's diagnosis, treatment plan and drug doses, and organ/hematologic function Lab orders and baseline tests specific to treatment regimen  The treatment plan start date, drug sequencing, and pre-medications Prior authorization status  Patient's documented medication list, including drug-drug interaction screen and prescriptions for anti-emetics and supportive care specific to the treatment regimen The drug concentrations, fluid compatibility, administration routes, and timing of the medications to be used The patient's access for treatment and lifetime cumulative dose history, if applicable  The patient's medication allergies and previous infusion related reactions, if applicable   Changes made to treatment plan:  N/A  Follow up needed:  [name] recommendations regarding need for neoadjuvant chemo Port placement 03/07/24 Release of home antiemetics  Harlene JONELLE Nasuti, RPH, 03/04/2024  3:25 PM

## 2024-03-04 NOTE — Progress Notes (Signed)
 Anesthesia Chart Review:  73 year old male with pertinent history including HTN, atrial fibrillation on Eliquis , elevated coronary calcium  score, OSA on CPAP, GERD on PPI, non-insulin -dependent DM2, recently diagnosed pancreatic cancer.  Patient was admitted in early December 2025 for GI bleeding, CT scan incidentally showed a 5.8 cm mass in the tail of pancreas, no evidence of nodal or distant metastasis.  CT scan showed possible invasion to gastric wall. He underwent a EUS biopsy which confirmed adenocarcinoma.  Tumor invades splenic artery, no other vascular involvement.  He was seen by [name] who recommended neoadjuvant chemotherapy.  Echo 06/18/2023 with LVEF 55 to 60%, small pericardial fusion, mild AI.  Repeat limited echo 09/2023 with persistent small pericardial effusion, although, noted to be smaller than prior.  Patient recently had cardiac clearance prior to undergoing EUS biopsy.  Per telephone encounter 02/16/2024 by Damien Braver, NP, Chart reviewed as part of pre-operative protocol coverage. Patient was contacted 02/16/2024 in reference to pre-operative risk assessment for pending surgery as outlined below.  [name] [name] was last seen on 12/24/2023 by Dr. Verlin.  Since that day, [name] [name] has done well from a cardiac standpoint.  He denies any new symptoms or concerns.  He is able to complete greater than 4 METS without difficulty. Therefore, based on ACC/AHA guidelines, the patient would be at acceptable risk for the planned procedure without further cardiovascular testing.  Patient reports last dose of Eliquis  03/01/2024.  CMP 02/18/2024 reviewed, creatinine mildly elevated 1.51, otherwise unremarkable.  CBC 02/29/2024 reviewed, stable anemia with hemoglobin 10.6, mild thrombocytopenia with platelets 100k, otherwise unremarkable.  EKG 02/09/2024: Sinus rhythm.  Rate 52.  Borderline intraventricular conduction delay.  No significant change.  Limited echo 09/15/2023: 1. Left  ventricular ejection fraction, by estimation, is 60 to 65%. The  left ventricle has normal function.   2. A small pericardial effusion is present. There is no evidence of  cardiac tamponade.   3. The inferior vena cava is normal in size with greater than 50%  respiratory variability, suggesting right atrial pressure of 3 mmHg.   Comparison(s): Prior images reviewed side by side. Changes from prior  study are noted. Effusion is smaller compared to prior study from 07/29/23.   TTE 06/18/2023:  1. Left ventricular ejection fraction, by estimation, is 55 to 60%. Left  ventricular ejection fraction by 3D volume is 55 %. The left ventricle has  normal function. The left ventricle has no regional wall motion  abnormalities. The left ventricular  internal cavity size was moderately dilated. Left ventricular diastolic  parameters are consistent with Grade I diastolic dysfunction (impaired  relaxation). Elevated left ventricular end-diastolic pressure. The average  left ventricular global longitudinal   strain is -24.8 %. The global longitudinal strain is normal.   2. Right ventricular systolic function is normal. The right ventricular  size is normal. Tricuspid regurgitation signal is inadequate for assessing  PA pressure.   3. A small pericardial effusion is present. The pericardial effusion is  circumferential.   4. The mitral valve is normal in structure. Trivial mitral valve  regurgitation. No evidence of mitral stenosis.   5. The aortic valve is tricuspid. Aortic valve regurgitation is mild.  Aortic valve sclerosis/calcification is present, without any evidence of  aortic stenosis.   6. Aortic dilatation noted. There is mild dilatation of the aortic root,  measuring 42 mm. There is mild dilatation of the ascending aorta,  measuring 41 mm.   7. The inferior vena cava is normal in  size with greater than 50%  respiratory variability, suggesting right atrial pressure of 3 mmHg.        [name] [name] [facility] Short Stay Center/Anesthesiology Phone [phone] 03/04/2024 9:16 AM

## 2024-03-04 NOTE — Progress Notes (Signed)
 Spoke with the pt, he will arrive on Mon at 0730. NPO post mn and he will stop clear liquids at 0630.

## 2024-03-04 NOTE — Anesthesia Preprocedure Evaluation (Signed)
 "                                  Anesthesia Evaluation  Patient identified by MRN, date of birth, ID band Patient awake    Reviewed: Allergy & Precautions, H&P , NPO status , Patient's Chart, lab work & pertinent test results  Airway Mallampati: III  TM Distance: >3 FB Neck ROM: Full    Dental no notable dental hx. (+) Teeth Intact, Dental Advisory Given   Pulmonary sleep apnea    Pulmonary exam normal breath sounds clear to auscultation       Cardiovascular hypertension, Pt. on medications + CAD and +CHF  + dysrhythmias Atrial Fibrillation  Rhythm:Regular Rate:Normal     Neuro/Psych negative neurological ROS  negative psych ROS   GI/Hepatic negative GI ROS, Neg liver ROS,,,  Endo/Other  diabetes, Type 2, Oral Hypoglycemic Agents    Renal/GU Renal InsufficiencyRenal disease  negative genitourinary   Musculoskeletal   Abdominal   Peds  Hematology  (+) Blood dyscrasia, anemia   Anesthesia Other Findings   Reproductive/Obstetrics negative OB ROS                              Anesthesia Physical Anesthesia Plan  ASA: 3  Anesthesia Plan: General   Post-op Pain Management: Tylenol  PO (pre-op)*   Induction: Intravenous  PONV Risk Score and Plan: 3 and Ondansetron , Dexamethasone  and Treatment may vary due to age or medical condition  Airway Management Planned: LMA  Additional Equipment:   Intra-op Plan:   Post-operative Plan: Extubation in OR  Informed Consent: I have reviewed the patients History and Physical, chart, labs and discussed the procedure including the risks, benefits and alternatives for the proposed anesthesia with the patient or authorized representative who has indicated his/her understanding and acceptance.     Dental advisory given  Plan Discussed with: CRNA  Anesthesia Plan Comments: (PAT note by [name] Hope, [name] [name]: 73 year old male with pertinent history including HTN, atrial fibrillation  on Eliquis , elevated coronary calcium  score, OSA on CPAP, GERD on PPI, non-insulin -dependent DM2, recently diagnosed pancreatic cancer.  Patient was admitted in early December 2025 for GI bleeding, CT scan incidentally showed a 5.8 cm mass in the tail of pancreas, no evidence of nodal or distant metastasis.  CT scan showed possible invasion to gastric wall. He underwent a EUS biopsy which confirmed adenocarcinoma.  Tumor invades splenic artery, no other vascular involvement.  He was seen by [name] who recommended neoadjuvant chemotherapy.  Echo 06/18/2023 with LVEF 55 to 60%, small pericardial fusion, mild AI.  Repeat limited echo 09/2023 with persistent small pericardial effusion, although, noted to be smaller than prior.  Patient recently had cardiac clearance prior to undergoing EUS biopsy.  Per telephone encounter 02/16/2024 by [name] Braver, [name] [name], Chart reviewed as part of pre-operative protocol coverage. Patient was contacted 02/16/2024 in reference to pre-operative risk assessment for pending surgery as outlined below.  [name] [name] was last seen on 12/24/2023 by Dr. Verlin.  Since that day, [name] [name] has done well from a cardiac standpoint.  He denies any new symptoms or concerns.  He is able to complete greater than 4 METS without difficulty. Therefore, based on ACC/AHA guidelines, the patient would be at acceptable risk for the planned procedure without further cardiovascular testing.  Patient reports last dose of Eliquis  03/01/2024.  CMP 02/18/2024  reviewed, creatinine mildly elevated 1.51, otherwise unremarkable.  CBC 02/29/2024 reviewed, stable anemia with hemoglobin 10.6, mild thrombocytopenia with platelets 100k, otherwise unremarkable.  EKG 02/09/2024: Sinus rhythm.  Rate 52.  Borderline intraventricular conduction delay.  No significant change.  Limited echo 09/15/2023: 1. Left ventricular ejection fraction, by estimation, is 60 to 65%. The  left ventricle has normal function.  2. A  small pericardial effusion is present. There is no evidence of  cardiac tamponade.  3. The inferior vena cava is normal in size with greater than 50%  respiratory variability, suggesting right atrial pressure of 3 mmHg.   Comparison(s): Prior images reviewed side by side. Changes from prior  study are noted. Effusion is smaller compared to prior study from 07/29/23.   TTE 06/18/2023: 1. Left ventricular ejection fraction, by estimation, is 55 to 60%. Left  ventricular ejection fraction by 3D volume is 55 %. The left ventricle has  normal function. The left ventricle has no regional wall motion  abnormalities. The left ventricular  internal cavity size was moderately dilated. Left ventricular diastolic  parameters are consistent with Grade I diastolic dysfunction (impaired  relaxation). Elevated left ventricular end-diastolic pressure. The average  left ventricular global longitudinal  strain is -24.8 %. The global longitudinal strain is normal.  2. Right ventricular systolic function is normal. The right ventricular  size is normal. Tricuspid regurgitation signal is inadequate for assessing  PA pressure.  3. A small pericardial effusion is present. The pericardial effusion is  circumferential.  4. The mitral valve is normal in structure. Trivial mitral valve  regurgitation. No evidence of mitral stenosis.  5. The aortic valve is tricuspid. Aortic valve regurgitation is mild.  Aortic valve sclerosis/calcification is present, without any evidence of  aortic stenosis.  6. Aortic dilatation noted. There is mild dilatation of the aortic root,  measuring 42 mm. There is mild dilatation of the ascending aorta,  measuring 41 mm.  7. The inferior vena cava is normal in size with greater than 50%  respiratory variability, suggesting right atrial pressure of 3 mmHg.    )         Anesthesia Quick Evaluation  "

## 2024-03-07 ENCOUNTER — Other Ambulatory Visit: Payer: Self-pay

## 2024-03-07 ENCOUNTER — Encounter: Admission: RE | Disposition: A | Payer: Self-pay | Attending: Surgery

## 2024-03-07 ENCOUNTER — Encounter (HOSPITAL_COMMUNITY): Payer: Self-pay | Admitting: Surgery

## 2024-03-07 ENCOUNTER — Ambulatory Visit (HOSPITAL_COMMUNITY)

## 2024-03-07 ENCOUNTER — Ambulatory Visit (HOSPITAL_COMMUNITY): Admitting: Certified Registered"

## 2024-03-07 ENCOUNTER — Ambulatory Visit (HOSPITAL_COMMUNITY)
Admission: RE | Admit: 2024-03-07 | Discharge: 2024-03-07 | Disposition: A | Discharge status: Home / Self Care | Attending: Surgery | Admitting: Surgery

## 2024-03-07 ENCOUNTER — Encounter: Payer: Self-pay | Admitting: Genetic Counselor

## 2024-03-07 ENCOUNTER — Encounter (HOSPITAL_COMMUNITY): Payer: Self-pay | Admitting: Vascular Surgery

## 2024-03-07 ENCOUNTER — Inpatient Hospital Stay

## 2024-03-07 ENCOUNTER — Other Ambulatory Visit: Payer: Self-pay | Admitting: *Deleted

## 2024-03-07 ENCOUNTER — Other Ambulatory Visit: Payer: Self-pay | Admitting: Hematology

## 2024-03-07 DIAGNOSIS — Z7984 Long term (current) use of oral hypoglycemic drugs: Secondary | ICD-10-CM | POA: Diagnosis not present

## 2024-03-07 DIAGNOSIS — C61 Malignant neoplasm of prostate: Secondary | ICD-10-CM

## 2024-03-07 DIAGNOSIS — I4891 Unspecified atrial fibrillation: Secondary | ICD-10-CM | POA: Diagnosis not present

## 2024-03-07 DIAGNOSIS — D649 Anemia, unspecified: Secondary | ICD-10-CM | POA: Diagnosis not present

## 2024-03-07 DIAGNOSIS — I509 Heart failure, unspecified: Secondary | ICD-10-CM

## 2024-03-07 DIAGNOSIS — Z452 Encounter for adjustment and management of vascular access device: Secondary | ICD-10-CM | POA: Diagnosis present

## 2024-03-07 DIAGNOSIS — Z1379 Encounter for other screening for genetic and chromosomal anomalies: Secondary | ICD-10-CM | POA: Insufficient documentation

## 2024-03-07 DIAGNOSIS — D759 Disease of blood and blood-forming organs, unspecified: Secondary | ICD-10-CM | POA: Insufficient documentation

## 2024-03-07 DIAGNOSIS — E119 Type 2 diabetes mellitus without complications: Secondary | ICD-10-CM | POA: Insufficient documentation

## 2024-03-07 DIAGNOSIS — Z91128 Patient's intentional underdosing of medication regimen for other reason: Secondary | ICD-10-CM | POA: Diagnosis not present

## 2024-03-07 DIAGNOSIS — I110 Hypertensive heart disease with heart failure: Secondary | ICD-10-CM

## 2024-03-07 DIAGNOSIS — C252 Malignant neoplasm of tail of pancreas: Secondary | ICD-10-CM

## 2024-03-07 DIAGNOSIS — I2510 Atherosclerotic heart disease of native coronary artery without angina pectoris: Secondary | ICD-10-CM | POA: Insufficient documentation

## 2024-03-07 HISTORY — PX: PORTACATH PLACEMENT: SHX2246

## 2024-03-07 LAB — GLUCOSE, CAPILLARY
Glucose-Capillary: 167 mg/dL — ABNORMAL HIGH (ref 70–99)
Glucose-Capillary: 164 mg/dL — ABNORMAL HIGH (ref 70–99)

## 2024-03-07 SURGERY — INSERTION, TUNNELED CENTRAL VENOUS DEVICE, WITH PORT
Anesthesia: General | Site: Chest | Laterality: Right

## 2024-03-07 MED ORDER — CHLORHEXIDINE GLUCONATE 0.12 % MT SOLN: 15.0000 mL | Freq: Once | OROMUCOSAL | Status: AC

## 2024-03-07 MED ORDER — LACTATED RINGERS IV SOLN: INTRAVENOUS | Status: DC

## 2024-03-07 MED ORDER — HEPARIN SOD (PORK) LOCK FLUSH 100 UNIT/ML IV SOLN
INTRAVENOUS | Status: DC | PRN
  Administered 2024-03-07: 500 [IU]

## 2024-03-07 MED ORDER — 0.9 % SODIUM CHLORIDE (POUR BTL) OPTIME
TOPICAL | Status: DC | PRN
  Administered 2024-03-07: 1000 mL

## 2024-03-07 MED ORDER — FENTANYL CITRATE (PF) 100 MCG/2ML IJ SOLN
INTRAMUSCULAR | Status: AC
  Filled 2024-03-07: qty 2

## 2024-03-07 MED ORDER — CEFAZOLIN SODIUM-DEXTROSE 2-4 GM/100ML-% IV SOLN
2.0000 g | INTRAVENOUS | Status: AC
  Administered 2024-03-07: 2 g via INTRAVENOUS

## 2024-03-07 MED ORDER — LOPERAMIDE HCL 2 MG PO CAPS: ORAL_CAPSULE | ORAL | 3 refills | Status: AC

## 2024-03-07 MED ORDER — LIDOCAINE 2% (20 MG/ML) 5 ML SYRINGE
INTRAMUSCULAR | Status: DC | PRN
  Administered 2024-03-07: 40 mg via INTRAVENOUS

## 2024-03-07 MED ORDER — HEPARIN 6000 UNIT IRRIGATION SOLUTION
Status: AC
  Filled 2024-03-07: qty 500

## 2024-03-07 MED ORDER — ONDANSETRON HCL 4 MG/2ML IJ SOLN
INTRAMUSCULAR | Status: DC | PRN
  Administered 2024-03-07: 4 mg via INTRAVENOUS

## 2024-03-07 MED ORDER — FENTANYL CITRATE (PF) 100 MCG/2ML IJ SOLN
25.0000 ug | INTRAMUSCULAR | Status: DC | PRN
  Administered 2024-03-07: 50 ug via INTRAVENOUS

## 2024-03-07 MED ORDER — HEPARIN SOD (PORK) LOCK FLUSH 100 UNIT/ML IV SOLN
INTRAVENOUS | Status: AC
  Filled 2024-03-07: qty 5

## 2024-03-07 MED ORDER — FENTANYL CITRATE (PF) 250 MCG/5ML IJ SOLN
INTRAMUSCULAR | Status: DC | PRN
  Administered 2024-03-07 (×2): 25 ug via INTRAVENOUS
  Administered 2024-03-07: 50 ug via INTRAVENOUS
  Administered 2024-03-07 (×3): 25 ug via INTRAVENOUS

## 2024-03-07 MED ORDER — LIDOCAINE 2% (20 MG/ML) 5 ML SYRINGE
INTRAMUSCULAR | Status: AC
  Filled 2024-03-07: qty 5

## 2024-03-07 MED ORDER — BUPIVACAINE-EPINEPHRINE (PF) 0.25% -1:200000 IJ SOLN
INTRAMUSCULAR | Status: AC
  Filled 2024-03-07: qty 30

## 2024-03-07 MED ORDER — ONDANSETRON HCL 4 MG/2ML IJ SOLN
INTRAMUSCULAR | Status: AC
  Filled 2024-03-07: qty 2

## 2024-03-07 MED ORDER — DEXAMETHASONE 4 MG PO TABS: 8.0000 mg | ORAL_TABLET | Freq: Every day | ORAL | 1 refills | Status: AC

## 2024-03-07 MED ORDER — ORAL CARE MOUTH RINSE: 15.0000 mL | Freq: Once | OROMUCOSAL | Status: AC

## 2024-03-07 MED ORDER — CEFAZOLIN SODIUM-DEXTROSE 2-4 GM/100ML-% IV SOLN
INTRAVENOUS | Status: AC
  Filled 2024-03-07: qty 100

## 2024-03-07 MED ORDER — PROPOFOL 10 MG/ML IV BOLUS
INTRAVENOUS | Status: DC | PRN
  Administered 2024-03-07: 70 mg via INTRAVENOUS
  Administered 2024-03-07: 130 mg via INTRAVENOUS

## 2024-03-07 MED ORDER — INSULIN ASPART 100 UNIT/ML IJ SOLN: 0.0000 [IU] | INTRAMUSCULAR | Status: DC | PRN

## 2024-03-07 MED ORDER — ONDANSETRON HCL 8 MG PO TABS: 8.0000 mg | ORAL_TABLET | Freq: Three times a day (TID) | ORAL | 1 refills | Status: AC | PRN

## 2024-03-07 MED ORDER — FENTANYL CITRATE (PF) 250 MCG/5ML IJ SOLN
INTRAMUSCULAR | Status: AC
  Filled 2024-03-07: qty 5

## 2024-03-07 MED ORDER — ACETAMINOPHEN 500 MG PO TABS
1000.0000 mg | ORAL_TABLET | Freq: Once | ORAL | Status: AC
  Administered 2024-03-07: 1000 mg via ORAL
  Filled 2024-03-07: qty 2

## 2024-03-07 MED ORDER — CHLORHEXIDINE GLUCONATE 0.12 % MT SOLN
OROMUCOSAL | Status: AC
  Administered 2024-03-07: 15 mL via OROMUCOSAL
  Filled 2024-03-07: qty 15

## 2024-03-07 MED ORDER — PROCHLORPERAZINE MALEATE 10 MG PO TABS: 10.0000 mg | ORAL_TABLET | Freq: Four times a day (QID) | ORAL | 1 refills | Status: AC | PRN

## 2024-03-07 MED ORDER — LIDOCAINE-PRILOCAINE 2.5-2.5 % EX CREA: TOPICAL_CREAM | CUTANEOUS | 3 refills | Status: AC

## 2024-03-07 MED ORDER — BUPIVACAINE-EPINEPHRINE 0.25% -1:200000 IJ SOLN
INTRAMUSCULAR | Status: DC | PRN
  Administered 2024-03-07: 10 mL

## 2024-03-07 MED ADMIN — Heparin Sodium (Porcine) Inj 1000 Unit/ML: 1 | NDC 99999070068

## 2024-03-07 SURGICAL SUPPLY — 34 items
BAG BANDED W/RUBBER/TAPE 36X54 (MISCELLANEOUS) IMPLANT
BAG COUNTER SPONGE SURGICOUNT (BAG) ×1 IMPLANT
BAG DECANTER FOR FLEXI CONT (MISCELLANEOUS) ×1 IMPLANT
CHLORAPREP W/TINT 26 (MISCELLANEOUS) ×1 IMPLANT
COVER DOME SNAP 22 D (MISCELLANEOUS) IMPLANT
COVER SURGICAL LIGHT HANDLE (MISCELLANEOUS) ×1 IMPLANT
COVER TRANSDUCER ULTRASND GEL (DISPOSABLE) IMPLANT
DERMABOND ADVANCED .7 DNX12 (GAUZE/BANDAGES/DRESSINGS) ×1 IMPLANT
DRAPE LAPAROSCOPIC ABDOMINAL (DRAPES) ×1 IMPLANT
ELECT CAUTERY BLADE 6.4 (BLADE) ×1 IMPLANT
ELECTRODE REM PT RTRN 9FT ADLT (ELECTROSURGICAL) ×1 IMPLANT
GEL ULTRASOUND 20GR AQUASONIC (MISCELLANEOUS) IMPLANT
GLOVE BIOGEL PI IND STRL 6 (GLOVE) ×1 IMPLANT
GLOVE SURG SYN 5.5 PF PI (GLOVE) ×1 IMPLANT
GOWN STRL REUS W/ TWL LRG LVL3 (GOWN DISPOSABLE) ×2 IMPLANT
KIT BASIN OR (CUSTOM PROCEDURE TRAY) ×1 IMPLANT
KIT PORT POWER 8FR ISP CVUE (Port) IMPLANT
KIT TURNOVER KIT B (KITS) ×1 IMPLANT
PAD ARMBOARD POSITIONER FOAM (MISCELLANEOUS) ×1 IMPLANT
PENCIL BUTTON HOLSTER BLD 10FT (ELECTRODE) ×1 IMPLANT
POSITIONER HEAD DONUT 9IN (MISCELLANEOUS) ×1 IMPLANT
SET INTRODUCER 12FR PACEMAKER (INTRODUCER) IMPLANT
SET SHEATH INTRO PEEL-AWY 10FR (MISCELLANEOUS) IMPLANT
SET SHEATH INTRO PEEL-AWY 11FR (CATHETERS) IMPLANT
SHEATH COOK PEEL AWAY SET 9F (SHEATH) IMPLANT
SOLN 0.9% NACL POUR BTL 1000ML (IV SOLUTION) ×1 IMPLANT
SUT MNCRL AB 4-0 PS2 18 (SUTURE) ×1 IMPLANT
SUT PROLENE 2 0 SH DA (SUTURE) ×2 IMPLANT
SUT VIC AB 3-0 SH 27X BRD (SUTURE) ×1 IMPLANT
SYR 5ML LUER SLIP (SYRINGE) ×1 IMPLANT
TOWEL GREEN STERILE (TOWEL DISPOSABLE) ×1 IMPLANT
TOWEL GREEN STERILE FF (TOWEL DISPOSABLE) ×1 IMPLANT
TRAY LAPAROSCOPIC MC (CUSTOM PROCEDURE TRAY) ×1 IMPLANT
TUBE CONNECTING 20X1/4 (TUBING) IMPLANT

## 2024-03-07 NOTE — Discharge Instructions (Addendum)
 SURGERY DISCHARGE INSTRUCTIONS: PORT-A-CATH PLACEMENT  Activity You may resume your usual activities as tolerated Ok to shower in 48 hours, but do not bathe or submerge incisions underwater. Do not drive while taking narcotic pain medication.  Wound Care Your incision is covered with skin glue called Dermabond. This will peel off on its own over time. You may shower and allow warm soapy water to run over your incisions. Gently pat dry. Do not submerge your incision underwater. Monitor your incision for any new redness, tenderness, or drainage. You may start using your port in 48 hours. Do not apply EMLA  cream directly over the Dermabond (skin glue).  When to Call Us : Fever greater than 100.5 New redness, drainage, or swelling at incision site Severe pain, nausea, or vomiting Shortness of breath, difficulty breathing  For questions or concerns, please call the [facility] Surgery office at [phone].  You may resume taking your Eliquis  the evening of Tuesday, December 23.

## 2024-03-07 NOTE — Anesthesia Postprocedure Evaluation (Signed)
"   Anesthesia Post Note  Patient: [name] [name]  Procedure(s) Performed: INSERTION, RIGHT TUNNELED CENTRAL VENOUS DEVICE, WITH PORT (Right: Chest)     Patient location during evaluation: PACU Anesthesia Type: General Level of consciousness: awake and alert Pain management: pain level controlled Vital Signs Assessment: post-procedure vital signs reviewed and stable Respiratory status: spontaneous breathing, nonlabored ventilation and respiratory function stable Cardiovascular status: blood pressure returned to baseline and stable Postop Assessment: no apparent nausea or vomiting Anesthetic complications: no   No notable events documented.  Last Vitals:  Vitals:   03/07/24 1100 03/07/24 1115  BP: 134/72 133/72  Pulse: (!) 56 (!) 55  Resp: 13 10  Temp:  36.6 C  SpO2: 95% 95%    Last Pain:  Vitals:   03/07/24 1115  TempSrc:   PainSc: 3                  [name],W. EDMOND      "

## 2024-03-07 NOTE — Op Note (Signed)
 Date: 03/07/2024  Patient: [name] [name] MRN: [id number]  Preoperative Diagnosis: Pancreatic adenocarcinoma Postoperative Diagnosis: Same  Procedure: Port-a-cath insertion  Surgeon: Leonor Dawn, MD  EBL: Minimal  Anesthesia: General LMA  Specimens: None  Indications: [name] is a 73 yo male who recently diagnosed with adenocarcinoma of the tail of the pancreas, with concern for involvement of the stomach and splenic flexure of the colon. He is to begin neoadjuvant chemotherapy and presents for port placement.  Findings: Single-lumen 8-Fr power port placed via the right subclavian vein under fluoroscopic guidance. Total catheter length of 17cm.  Procedure details: Informed consent was obtained in the preoperative area prior to the procedure. The patient was brought to the operating room and placed on the table in the supine position. General anesthesia was induced and appropriate lines and drains were placed for intraoperative monitoring. Perioperative antibiotics were administered per SCIP guidelines. The chest and neck were prepped and draped in the usual sterile fashion. A pre-procedure timeout was taken verifying patient identity, surgical site and procedure to be performed.  The patient was placed in Trendelenberg and the right subclavian vein was accessed with a large-bore needle. A guidewire was inserted and advanced, and position in the SVC was confirmed fluoroscopically. The needle was removed and the wire was clipped to the drapes to secure its position. Next a small skin incision was made on the right upper chest wall, incorporating the wire exit site, and a subcutaneous pocket was created with cautery. The port and catheter were then flushed and brought onto the field. Three 2-0 prolene sutures were used to secure the port in the subcutaneous pocket, but the sutures were not tied down. The port was placed in the pocket and the attached cathether was measured using fluoro - it  was placed over the skin adjacent to the guidewire, and marked externally at the cavoatrial junction. The catheter was then cut at this location, which was at 17cm. The dilator and sheath were then advanced over the guidewire under fluoroscopic guidance, and the wire and dilator were removed. The end of the catheter was inserted through the sheath and advanced, and the sheath was peeled away. The port was then accessed with a Huber needle, and blood was aspirated and the port was flushed with heparinized saline. A final fluoroscopic image confirmed appropriate position of the catheter tip within the SVC, without kinking of the catheter. The prolene sutures were tied down. The skin was closed with a deep dermal layer of interrupted 3-0 Vicryl suture, followed by a running subcuticular 4-0 monocryl suture. A final flush of 500 units heparin  (100 units/mL) was given via the port. Dermabond was applied.  The patient tolerated the procedure well with no apparent complications. All counts were correct x2 at the end of the procedure. The patient was extubated and taken to PACU in stable condition.  Leonor Dawn, MD 03/07/2024 10:09 AM

## 2024-03-07 NOTE — Interval H&P Note (Signed)
 History and Physical Interval Note:  03/07/2024 8:32 AM  [name] [name]  has presented today for surgery, with the diagnosis of PANCREATIC CANCER.  The various methods of treatment have been discussed with the patient and family. After consideration of risks, benefits and other options for treatment, the patient has consented to  Procedures with comments: INSERTION, TUNNELED CENTRAL VENOUS DEVICE, WITH PORT (N/A) - PORT-A-CATH INSERTION WITH ULTRASOUND GUIDANCE as a surgical intervention.  The patient's history has been reviewed, patient examined, no change in status, stable for surgery.  I have reviewed the patient's chart and labs. Risks and benefits of port placement were reviewed, including the risk of pneumothorax. Questions were answered to the patient's satisfaction.  Plan for CXR in PACU and discharge home. Patient is to start chemotherapy next week.   Leonor LITTIE Dawn

## 2024-03-07 NOTE — Anesthesia Procedure Notes (Signed)
 Procedure Name: LMA Insertion Date/Time: 03/07/2024 9:18 AM  Performed by: Myrna Homer, CRNAPre-anesthesia Checklist: Patient identified, Emergency Drugs available, Suction available and Patient being monitored Patient Re-evaluated:Patient Re-evaluated prior to induction Oxygen Delivery Method: Circle System Utilized Preoxygenation: Pre-oxygenation with 100% oxygen Induction Type: IV induction Ventilation: Mask ventilation without difficulty LMA: LMA inserted and LMA with gastric port inserted LMA Size: 5.0 Number of attempts: 1 Airway Equipment and Method: Bite block Placement Confirmation: positive ETCO2 Tube secured with: Tape Dental Injury: Teeth and Oropharynx as per pre-operative assessment

## 2024-03-07 NOTE — Transfer of Care (Signed)
 Immediate Anesthesia Transfer of Care Note  Patient: [name] [name]  Procedure(s) Performed: INSERTION, RIGHT TUNNELED CENTRAL VENOUS DEVICE, WITH PORT (Right: Chest)  Patient Location: PACU  Anesthesia Type:General  Level of Consciousness: awake, drowsy, patient cooperative, and responds to stimulation  Airway & Oxygen Therapy: Patient Spontanous Breathing and Patient connected to face mask oxygen  Post-op Assessment: Report given to RN and Post -op Vital signs reviewed and stable  Post vital signs: Reviewed and stable  Last Vitals:  Vitals Value Taken Time  BP    Temp    Pulse 57 03/07/24 10:16  Resp    SpO2 99 % 03/07/24 10:16  Vitals shown include unfiled device data.  Last Pain:  Vitals:   03/07/24 0848  TempSrc:   PainSc: 0-No pain      Patients Stated Pain Goal: 3 (03/07/24 0844)  Complications: No notable events documented.

## 2024-03-07 NOTE — Progress Notes (Signed)
 [name], please see patient's request!   [name], I saw [name] today and he agreed with your plan for chemotherapy in advance. of the surgery. Let's proceed with the port on Monday and the first round of chemotherapy after Christmas as you suggested. Thank you.  [name] spoke very highly of you and is willing to work with you during my treatment. He wants to see me again at the end of March (March 20) [name] [name]

## 2024-03-08 ENCOUNTER — Encounter (HOSPITAL_COMMUNITY): Payer: Self-pay | Admitting: Surgery

## 2024-03-08 ENCOUNTER — Inpatient Hospital Stay

## 2024-03-08 DIAGNOSIS — C252 Malignant neoplasm of tail of pancreas: Secondary | ICD-10-CM

## 2024-03-11 ENCOUNTER — Other Ambulatory Visit: Payer: Self-pay | Admitting: Primary Care

## 2024-03-11 DIAGNOSIS — E1165 Type 2 diabetes mellitus with hyperglycemia: Secondary | ICD-10-CM

## 2024-03-12 ENCOUNTER — Encounter: Payer: Self-pay | Admitting: Hematology

## 2024-03-13 NOTE — Progress Notes (Unsigned)
 " Patient Care Team: [name] Comer POUR, NP as PCP - General (Internal Medicine) [name] Lonni BIRCH, MD as PCP - Cardiology (Cardiology) [name] Specking, MD as Consulting Physician (Gastroenterology) Ardis, [name] CROME, RN as Oncology Nurse Navigator [name] Callander, MD as Consulting Physician (Hematology) [name] [name], [place] (Optometry)  Clinic Day:  03/14/2024  Referring physician: [name], MD  ASSESSMENT & PLAN:   Assessment & Plan: Pancreatic cancer [facility]) Adenocarcinoma in tail of pancreas, cT3N0M0 -Patient was admitted in early December 2025 for GI bleeding, CT scan incidentally showed a 5.8 cm mass in the tail of pancreas, no evidence of nodal or distant metastasis.  CT scan showed possible invasion to gastric wall. -He underwent a EUS biopsy which confirmed adenocarcinoma.  Tumor invades splenic artery, no other vascular involvement. -Pt was seen by surgeon [name]. We recommend neoadjuvant chemo  - 03/14/2024 -patient presents for cycle 1 day 1 neoadjuvant chemotherapy FOLFIRINOX.  Case discussed with Dr. Cloretta prior to proceeding with chemotherapy today.  Platelet count of 82.  100 to a reduced dose oxaliplatin  to 65 mg/m and irinotecan  to 100 mg/m.  Leucovorin  and fluorouracil  doses were unchanged.  Will recheck CBC and follow-up with patient in 1 week.    Thrombocytopenia  Baseline platelet count low, generally between 95 and 100. Today, platelet count 82. Discussed case with Dr. Cloretta. He stated it is okay to proceed, He further dosed reduce the irinotecan  and dose reduce the oxaliplatin . Oxaliplatin  reduced to 65 mg/m2, irinotecan  reduced to 100 mg/m2. The patient should call for bleeding or fever, Will check CBC next week . [name] can increase the doses with subsequent cycles as indicated.   Anemia Mild and stable anemia with Hgb 11.7 and Hct 35.9. continues to take oral iron  supplement daily. This was renewed for him today. Advised about constipation  prevention, especially with addition of iron  supplement. He knows to take OTC stool softener or mirialax daily if constipation should develop. There is no requirement for IV iron  or blood transfusion today.   Pancreatic cancer, Stage Iib Patient presenting for Cycle 1 day 1 chemotherapy with FOLFIRINOX. He had port placement on 03/07/2024 and completed chemotherapy education on 03/08/2024. He has no questions today. Reviewed common side effects and recommended he stay ahead of side effects as they occur.   Plan Labs reviewed . -thrombocytopenia slightly lower than baseline at 82.  --case discussed with Dr. Cloretta. Dose reductions of oxaliplatin  and irinotecan  made. Recheck CBC in 1 week with follow up.  --additional labs ordered today to evaluate for causes of thrombocytopenia.  -CMP unremarkable.  Proceed with Cycle 1 day 1 neoadjuvant chemotherapy FOLFIRINOX with above mentioned dose reductions.  Check labs with follow up visit in 1 week.  Labs/flush, follow up, and subsequent treatments as scheduled.     The patient understands the plans discussed today and is in agreement with them.  He knows to contact our office if he develops concerns prior to his next appointment.  I provided 30 minutes of face-to-face time during this encounter and > 50% was spent counseling as documented under my assessment and plan.    [name] FORBES Lessen, NP  [facility] - A DEPT OF [facility] [address] [place] [address] Dept: [phone] Dept Fax: [phone]   Orders Placed This Encounter  Procedures   Vitamin B12    Standing Status:   Future    Number of Occurrences:  1    Expected Date:   03/14/2024    Expiration Date:   06/12/2024   Ferritin    Standing Status:   Future    Number of Occurrences:   1    Expected Date:   03/14/2024    Expiration Date:   06/12/2024   Folate RBC    Standing Status:   Future    Number of  Occurrences:   1    Expected Date:   03/14/2024    Expiration Date:   06/12/2024   Hepatitis panel, acute    Standing Status:   Future    Number of Occurrences:   1    Expected Date:   03/14/2024    Expiration Date:   06/12/2024   HIV antibody (with reflex)    Standing Status:   Future    Number of Occurrences:   1    Expected Date:   03/14/2024    Expiration Date:   06/12/2024   CBC with Differential (Cancer Center Only)    Standing Status:   Future    Expected Date:   03/21/2024    Expiration Date:   03/14/2025      CHIEF COMPLAINT:  CC: Pancreatic cancer  Current Treatment: Neoadjuvant chemotherapy FOLFIRINOX q. 14 days  INTERVAL HISTORY:  [name] [name] is here today for repeat clinical assessment.  The patient last saw [name] on 02/29/2024.  Today, he presents for cycle 1 day 1 neoadjuvant chemotherapy FOLFIRINOX.  Platelet count today below baseline at 82.  Discussed case with Dr. Cloretta who further reduced dose of irinotecan  and dose reduced oxaliplatin .  Patient okay to proceed with neoadjuvant chemotherapy FOLFIRINOX with dose adjustments.  Patient states he is feeling well.  His appetite is good.  He denies nausea, vomiting, diarrhea, constipation.  He states he does need a letter for travel insurance.  Did cancel a trip in March due to chemotherapy treatments.  He denies chest pain, chest pressure, or shortness of breath. He denies headaches or visual disturbances.  He denies unusual bleeding such as blood in his stool, hematemesis, or epistaxis.  He denies fevers or chills. He denies pain. His appetite is good. His weight has increased 4 pounds over last week.  I have reviewed the past medical history, past surgical history, social history and family history with the patient and they are unchanged from previous note.  ALLERGIES:  is allergic to yellow jacket venom.  MEDICATIONS:  Current Outpatient Medications  Medication Sig Dispense Refill   Accu-Chek FastClix Lancets MISC 1  each by Does not apply route daily. 100 each 3   amLODipine  (NORVASC ) 10 MG tablet Take 10 mg by mouth daily.     apixaban  (ELIQUIS ) 5 MG TABS tablet Take 1 tablet (5 mg total) by mouth 2 (two) times daily.     dapagliflozin  propanediol (FARXIGA ) 10 MG TABS tablet TAKE 1 TABLET (10 MG TOTAL) BY MOUTH DAILY BEFORE BREAKFAST. FOR DIABETES. 90 tablet 1   dexamethasone  (DECADRON ) 4 MG tablet Take 2 tablets (8 mg total) by mouth daily. Take 2 tablets daily x 3 days starting the day after chemotherapy. Take with food. 30 tablet 1   EPINEPHrine  0.3 mg/0.3 mL IJ SOAJ injection Inject 0.3 mg into the muscle as needed for anaphylaxis. 1 each 0   gabapentin  (NEURONTIN ) 300 MG capsule Take 300 mg by mouth 1 day or 1 dose.     Glucosamine-Chondroit-Vit C-Mn (GLUCOSAMINE 1500 COMPLEX) CAPS Take 1 capsule by mouth daily.  glucose blood (ACCU-CHEK GUIDE) test strip Use one daily to check blood sugar 100 strip 12   Iron , Ferrous Sulfate , 325 (65 Fe) MG TABS Take 65 mg by mouth daily with breakfast. 30 tablet 0   lidocaine -prilocaine  (EMLA ) cream Apply to affected area once 30 g 3   loperamide  (IMODIUM ) 2 MG capsule Take 2 tabs by mouth with first loose stool, then 1 tab with each additional loose stool as needed. Do not exceed 8 tabs in a 24-hour period 60 capsule 3   Multiple Vitamins-Minerals (MULTIVITAMIN WITH MINERALS) tablet Take 1 tablet by mouth daily with breakfast.     ondansetron  (ZOFRAN ) 8 MG tablet Take 1 tablet (8 mg total) by mouth every 8 (eight) hours as needed for nausea or vomiting. Start on the third day after chemotherapy 30 tablet 1   pantoprazole  (PROTONIX ) 40 MG tablet Take 1 tablet (40 mg total) by mouth daily. 30 tablet 0   PRESCRIPTION MEDICATION CPAP- At bedtime     prochlorperazine  (COMPAZINE ) 10 MG tablet Take 1 tablet (10 mg total) by mouth every 6 (six) hours as needed for nausea or vomiting (Nausea or vomiting). 30 tablet 1   rosuvastatin  (CRESTOR ) 40 MG tablet Take 1 tablet (40  mg total) by mouth at bedtime.     tadalafil  (CIALIS ) 20 MG tablet Take 1 tablet (20 mg total) by mouth daily as needed for erectile dysfunction. 30 tablet 5   Testosterone  20.25 MG/ACT (1.62%) GEL APPLY 2 PUMPS TO THE SHOULDER AND ARM DAILY 150 g 0   No current facility-administered medications for this visit.   Facility-Administered Medications Ordered in Other Visits  Medication Dose Route Frequency Provider Last Rate Last Admin   0.9 %  sodium chloride  infusion   Intravenous Continuous [name] Callander, MD       alteplase (CATHFLO ACTIVASE) injection 2 mg  2 mg Intracatheter Once PRN [name] Callander, MD       dextrose  5 % solution   Intravenous Continuous [name] Callander, MD 10 mL/hr at 03/14/24 0946 New Bag at 03/14/24 0946   fluorouracil  (ADRUCIL ) 5,000 mg in sodium chloride  0.9 % 150 mL chemo infusion  2,400 mg/m2 (Treatment Plan Recorded) Intravenous 1 day or 1 dose [name] Callander, MD       heparin  lock flush 100 unit/mL  500 Units Intracatheter Once PRN [name] Callander, MD       heparin  lock flush 100 unit/mL  250 Units Intracatheter Once PRN [name] Callander, MD       irinotecan  (CAMPTOSAR ) 220 mg in sodium chloride  0.9 % 500 mL chemo infusion  100 mg/m2 (Treatment Plan Recorded) Intravenous Once Sherrill, Edrik B, MD       leucovorin  904 mg in sodium chloride  0.9 % 250 mL infusion  400 mg/m2 (Treatment Plan Recorded) Intravenous Once [name] Callander, MD       oxaliplatin  (ELOXATIN ) 150 mg in dextrose  5 % 500 mL chemo infusion  65 mg/m2 (Treatment Plan Recorded) Intravenous Once Sherrill, Mercy B, MD       sodium chloride  flush (NS) 0.9 % injection 10 mL  10 mL Intracatheter PRN [name] Callander, MD       sodium chloride  flush (NS) 0.9 % injection 3 mL  3 mL Intracatheter PRN [name] Callander, MD        HISTORY OF PRESENT ILLNESS:   Oncology History  Pancreatic cancer (HCC)  02/17/2024 Cancer Staging   Staging form: Exocrine Pancreas, AJCC 8th Edition - Clinical stage from 02/17/2024: Stage IIA (cT3, cN0, cM0) -  Signed by [name] Callander, MD on  02/29/2024 Stage prefix: Initial diagnosis Total positive nodes: 0   02/22/2024 Initial Diagnosis   Pancreatic cancer (HCC)   03/06/2024 Genetic Testing   Negative genetic testing. The report date is March 06, 2024  The CancerNext-Expanded gene panel offered by [employer] and includes sequencing, rearrangement, and RNA analysis for the following 77 genes: AIP, ALK, APC, ATM, BAP1, BARD1, BMPR1A, BRCA1, BRCA2, BRIP1, CDC73, CDH1, CDK4, CDKN1B, CDKN2A, CEBPA, CHEK2, CTNNA1, DDX41, DICER1, ETV6, FH, FLCN, GATA2, LZTR1, MAX, MBD4, MEN1, MET, MLH1, MSH2, MSH3, MSH6, MUTYH, NF1, NF2, NTHL1, PALB2, PHOX2B, PMS2, POT1, PRKAR1A, PTCH1, PTEN, RAD51C, RAD51D, RB1, RET, RPS20, RUNX1, SDHA, SDHAF2, SDHB, SDHC, SDHD, SMAD4, SMARCA4, SMARCB1, SMARCE1, STK11, SUFU, TMEM127, TP53, TSC1, TSC2, VHL, and WT1 (sequencing and deletion/duplication); AXIN2, CTNNA1, DDX41, EGFR, HOXB13, KIT, MBD4, MITF, MSH3, PDGFRA, POLD1 and POLE (sequencing only); EPCAM and GREM1 (deletion/duplication only). RNA data is routinely analyzed for use in variant interpretation for all genes.    03/14/2024 -  Chemotherapy   Patient is on Treatment Plan : PANCREAS Modified FOLFIRINOX q14d x 4 cycles         REVIEW OF SYSTEMS:   Constitutional: Denies fevers, chills or abnormal weight loss Eyes: Denies blurriness of vision Ears, nose, mouth, throat, and face: Denies mucositis or sore throat Respiratory: Denies cough, dyspnea or wheezes Cardiovascular: Denies palpitation, chest discomfort or lower extremity swelling Gastrointestinal:  Denies nausea, heartburn or change in bowel habits Skin: Denies abnormal skin rashes Lymphatics: Denies new lymphadenopathy or easy bruising Neurological:Denies numbness, tingling or new weaknesses Behavioral/Psych: Mood is stable, no new changes  All other systems were reviewed with the patient and are negative.   VITALS:   Today's Vitals   03/14/24 0800  BP: 135/73  Pulse: 69  Resp: 18   Temp: 97.9 F (36.6 C)  TempSrc: Temporal  SpO2: 98%  Weight: 224 lb 4.8 oz (101.7 kg)  PainSc: 0-No pain   Body mass index is 31.28 kg/m.   Wt Readings from Last 3 Encounters:  03/14/24 224 lb 4.8 oz (101.7 kg)  03/07/24 220 lb (99.8 kg)  03/03/24 223 lb 9.6 oz (101.4 kg)    Body mass index is 31.28 kg/m.  Performance status (ECOG): 1 - Symptomatic but completely ambulatory  PHYSICAL EXAM:   GENERAL:alert, no distress and comfortable SKIN: skin color, texture, turgor are normal, no rashes or significant lesions EYES: normal, Conjunctiva are pink and non-injected, sclera clear OROPHARYNX:no exudate, no erythema and lips, buccal mucosa, and tongue normal  NECK: supple, thyroid normal size, non-tender, without nodularity LYMPH:  no palpable lymphadenopathy in the cervical, axillary or inguinal LUNGS: clear to auscultation and percussion with normal breathing effort HEART: regular rate & rhythm and no murmurs and no lower extremity edema ABDOMEN:abdomen soft, non-tender and normal bowel sounds Musculoskeletal:no cyanosis of digits and no clubbing  NEURO: alert & oriented x 3 with fluent speech, no focal motor/sensory deficits  LABORATORY DATA:  I have reviewed the data as listed    Component Value Date/Time   NA 138 03/14/2024 0739   NA 142 02/04/2023 1140   K 3.6 03/14/2024 0739   CL 101 03/14/2024 0739   CO2 25 03/14/2024 0739   GLUCOSE 258 (H) 03/14/2024 0739   BUN 23 03/14/2024 0739   BUN 15 02/04/2023 1140   CREATININE 1.19 03/14/2024 0739   CREATININE 1.16 04/17/2023 1622   CALCIUM  9.4 03/14/2024 0739   PROT 6.9 03/14/2024 0739   PROT 6.8 02/04/2023 1140  ALBUMIN 4.4 03/14/2024 0739   ALBUMIN 4.5 02/04/2023 1140   AST 19 03/14/2024 0739   ALT 11 03/14/2024 0739   ALKPHOS 52 03/14/2024 0739   BILITOT 1.1 03/14/2024 0739   GFRNONAA >60 03/14/2024 0739   GFRAA >60 11/03/2019 0611     Lab Results  Component Value Date   WBC 4.0 03/14/2024   NEUTROABS  3.1 03/14/2024   HGB 11.7 (L) 03/14/2024   HCT 35.9 (L) 03/14/2024   MCV 87.1 03/14/2024   PLT 82 (L) 03/14/2024    RADIOGRAPHIC STUDIES: DG CHEST PORT 1 VIEW Result Date: 03/07/2024 EXAM: 1 VIEW(S) XRAY OF THE CHEST 03/07/2024 10:22:07 AM COMPARISON: PA and lateral radiographs of the chest dated 01/02/2005. CLINICAL HISTORY: [id number] Port-A-Cath in place [id number] Port-A-Cath in place. FINDINGS: LINES, TUBES AND DEVICES: A right subclavian chest port has been placed with its tip in the Superior Vena Cava. LUNGS AND PLEURA: No focal pulmonary opacity. No pleural effusion. No pneumothorax. HEART AND MEDIASTINUM: There is calcification within the Aortic Arch. BONES AND SOFT TISSUES: Surgical clips are present within the left neck. No acute osseous abnormality. IMPRESSION: 1. Right subclavian chest port in place with its tip in the superior vena cava. 2. Calcification within the aortic arch. 3. Surgical clips in the left neck. Electronically signed by: [name] Coho MD 03/07/2024 11:14 AM EST RP Workstation: [id number]   HYBRID OR IMAGING ([facility] ONLY) Result Date: 03/07/2024 There is no interpretation for this exam.  This order is for images obtained during a surgical procedure.  Please See Surgeries Tab for more information regarding the procedure.   "

## 2024-03-13 NOTE — Assessment & Plan Note (Signed)
 Adenocarcinoma in tail of pancreas, cT3N0M0 -Patient was admitted in early December 2025 for GI bleeding, CT scan incidentally showed a 5.8 cm mass in the tail of pancreas, no evidence of nodal or distant metastasis.  CT scan showed possible invasion to gastric wall. -He underwent a EUS biopsy which confirmed adenocarcinoma.  Tumor invades splenic artery, no other vascular involvement. -Pt was seen by surgeon [name]. We recommend neoadjuvant chemo  - 03/14/2024 -patient presents for cycle 1 day 1 neoadjuvant chemotherapy FOLFIRINOX.

## 2024-03-14 ENCOUNTER — Other Ambulatory Visit: Payer: Self-pay | Admitting: Oncology

## 2024-03-14 ENCOUNTER — Inpatient Hospital Stay

## 2024-03-14 ENCOUNTER — Encounter: Payer: Self-pay | Admitting: Nurse Practitioner

## 2024-03-14 ENCOUNTER — Other Ambulatory Visit: Payer: Self-pay

## 2024-03-14 ENCOUNTER — Telehealth: Payer: Self-pay

## 2024-03-14 ENCOUNTER — Other Ambulatory Visit: Payer: Self-pay | Admitting: *Deleted

## 2024-03-14 ENCOUNTER — Inpatient Hospital Stay (HOSPITAL_BASED_OUTPATIENT_CLINIC_OR_DEPARTMENT_OTHER): Admitting: Nurse Practitioner

## 2024-03-14 VITALS — BP 135/73 | HR 69 | Temp 97.9°F | Resp 18 | Wt 224.3 lb

## 2024-03-14 DIAGNOSIS — D696 Thrombocytopenia, unspecified: Secondary | ICD-10-CM

## 2024-03-14 DIAGNOSIS — Z114 Encounter for screening for human immunodeficiency virus [HIV]: Secondary | ICD-10-CM

## 2024-03-14 DIAGNOSIS — C252 Malignant neoplasm of tail of pancreas: Secondary | ICD-10-CM

## 2024-03-14 DIAGNOSIS — E1165 Type 2 diabetes mellitus with hyperglycemia: Secondary | ICD-10-CM

## 2024-03-14 DIAGNOSIS — Z7289 Other problems related to lifestyle: Secondary | ICD-10-CM

## 2024-03-14 DIAGNOSIS — D500 Iron deficiency anemia secondary to blood loss (chronic): Secondary | ICD-10-CM

## 2024-03-14 DIAGNOSIS — R530 Neoplastic (malignant) related fatigue: Secondary | ICD-10-CM | POA: Diagnosis not present

## 2024-03-14 DIAGNOSIS — Z5111 Encounter for antineoplastic chemotherapy: Secondary | ICD-10-CM | POA: Diagnosis not present

## 2024-03-14 LAB — CBC WITH DIFFERENTIAL (CANCER CENTER ONLY)
Abs Immature Granulocytes: 0 K/uL (ref 0.00–0.07)
Basophils Absolute: 0 K/uL (ref 0.0–0.1)
Basophils Relative: 1 %
Eosinophils Absolute: 0.1 K/uL (ref 0.0–0.5)
Eosinophils Relative: 2 %
HCT: 35.9 % — ABNORMAL LOW (ref 39.0–52.0)
Hemoglobin: 11.7 g/dL — ABNORMAL LOW (ref 13.0–17.0)
Immature Granulocytes: 0 %
Lymphocytes Relative: 12 %
Lymphs Abs: 0.5 K/uL — ABNORMAL LOW (ref 0.7–4.0)
MCH: 28.4 pg (ref 26.0–34.0)
MCHC: 32.6 g/dL (ref 30.0–36.0)
MCV: 87.1 fL (ref 80.0–100.0)
Monocytes Absolute: 0.4 K/uL (ref 0.1–1.0)
Monocytes Relative: 10 %
Neutro Abs: 3.1 K/uL (ref 1.7–7.7)
Neutrophils Relative %: 75 %
Platelet Count: 82 K/uL — ABNORMAL LOW (ref 150–400)
RBC: 4.12 MIL/uL — ABNORMAL LOW (ref 4.22–5.81)
RDW: 12.8 % (ref 11.5–15.5)
WBC Count: 4 K/uL (ref 4.0–10.5)
nRBC: 0 % (ref 0.0–0.2)

## 2024-03-14 LAB — CMP (CANCER CENTER ONLY)
ALT: 11 U/L (ref 0–44)
AST: 19 U/L (ref 15–41)
Albumin: 4.4 g/dL (ref 3.5–5.0)
Alkaline Phosphatase: 52 U/L (ref 38–126)
Anion gap: 12 (ref 5–15)
BUN: 23 mg/dL (ref 8–23)
CO2: 25 mmol/L (ref 22–32)
Calcium: 9.4 mg/dL (ref 8.9–10.3)
Chloride: 101 mmol/L (ref 98–111)
Creatinine: 1.19 mg/dL (ref 0.61–1.24)
GFR, Estimated: >60 mL/min
Glucose, Bld: 258 mg/dL — ABNORMAL HIGH (ref 70–99)
Potassium: 3.6 mmol/L (ref 3.5–5.1)
Sodium: 138 mmol/L (ref 135–145)
Total Bilirubin: 1.1 mg/dL (ref 0.0–1.2)
Total Protein: 6.9 g/dL (ref 6.5–8.1)

## 2024-03-14 LAB — FERRITIN: Ferritin: 49 ng/mL (ref 24–336)

## 2024-03-14 LAB — VITAMIN B12: Vitamin B-12: 416 pg/mL (ref 180–914)

## 2024-03-14 LAB — HIV ANTIBODY (ROUTINE TESTING W REFLEX): HIV Screen 4th Generation wRfx: NONREACTIVE

## 2024-03-14 LAB — HEPATITIS PANEL, ACUTE
HCV Ab: NONREACTIVE
Hep A IgM: NONREACTIVE
Hep B C IgM: NONREACTIVE
Hepatitis B Surface Ag: NONREACTIVE

## 2024-03-14 MED ORDER — HEPARIN SOD (PORK) LOCK FLUSH 100 UNIT/ML IV SOLN: 250.0000 [IU] | Freq: Once | INTRAVENOUS | Status: DC | PRN

## 2024-03-14 MED ORDER — APREPITANT 130 MG/18ML IV EMUL
130.0000 mg | Freq: Once | INTRAVENOUS | Status: AC
  Administered 2024-03-14: 130 mg via INTRAVENOUS
  Filled 2024-03-14: qty 18

## 2024-03-14 MED ORDER — SODIUM CHLORIDE 0.9 % IV SOLN
2400.0000 mg/m2 | INTRAVENOUS | Status: DC
  Administered 2024-03-14: 5000 mg via INTRAVENOUS
  Filled 2024-03-14: qty 100

## 2024-03-14 MED ORDER — HEPARIN SOD (PORK) LOCK FLUSH 100 UNIT/ML IV SOLN: 500.0000 [IU] | Freq: Once | INTRAVENOUS | Status: DC | PRN

## 2024-03-14 MED ORDER — PANTOPRAZOLE SODIUM 40 MG PO TBEC: 40.0000 mg | DELAYED_RELEASE_TABLET | Freq: Every day | ORAL | 0 refills | Status: DC

## 2024-03-14 MED ORDER — IRON (FERROUS SULFATE) 325 (65 FE) MG PO TABS: 65.0000 mg | ORAL_TABLET | Freq: Every day | ORAL | 0 refills | Status: AC

## 2024-03-14 MED ORDER — DEXAMETHASONE SOD PHOSPHATE PF 10 MG/ML IJ SOLN
10.0000 mg | Freq: Once | INTRAMUSCULAR | Status: AC
  Administered 2024-03-14: 10 mg via INTRAVENOUS

## 2024-03-14 MED ORDER — SODIUM CHLORIDE 0.9 % IV SOLN
400.0000 mg/m2 | Freq: Once | INTRAVENOUS | Status: AC
  Administered 2024-03-14: 904 mg via INTRAVENOUS
  Filled 2024-03-14: qty 45.2

## 2024-03-14 MED ORDER — SODIUM CHLORIDE 0.9% FLUSH: 3.0000 mL | INTRAVENOUS | Status: DC | PRN

## 2024-03-14 MED ORDER — SODIUM CHLORIDE 0.9 % IV SOLN: INTRAVENOUS | Status: DC

## 2024-03-14 MED ORDER — DEXTROSE 5 % IV SOLN: INTRAVENOUS | Status: DC

## 2024-03-14 MED ORDER — PALONOSETRON HCL INJECTION 0.25 MG/5ML
0.2500 mg | Freq: Once | INTRAVENOUS | Status: AC
  Administered 2024-03-14: 0.25 mg via INTRAVENOUS
  Filled 2024-03-14: qty 5

## 2024-03-14 MED ORDER — ALTEPLASE 2 MG IJ SOLR: 2.0000 mg | Freq: Once | INTRAMUSCULAR | Status: DC | PRN

## 2024-03-14 MED ORDER — OXALIPLATIN CHEMO INJECTION 100 MG/20ML
65.0000 mg/m2 | Freq: Once | INTRAVENOUS | Status: AC
  Administered 2024-03-14: 150 mg via INTRAVENOUS
  Filled 2024-03-14: qty 10

## 2024-03-14 MED ORDER — SODIUM CHLORIDE 0.9 % IV SOLN
100.0000 mg/m2 | Freq: Once | INTRAVENOUS | Status: AC
  Administered 2024-03-14: 220 mg via INTRAVENOUS
  Filled 2024-03-14: qty 11

## 2024-03-14 MED ORDER — SODIUM CHLORIDE 0.9% FLUSH: 10.0000 mL | INTRAVENOUS | Status: DC | PRN

## 2024-03-14 NOTE — Patient Instructions (Addendum)
 [facility] - A DEPT OF . [facility]  Discharge Instructions: Thank you for choosing [facility] to provide your oncology and hematology care.   If you have a lab appointment with the Cancer Center, please go directly to the Cancer Center and check in at the registration area.   Wear comfortable clothing and clothing appropriate for easy access to any Portacath or PICC line.   We strive to give you quality time with your provider. You may need to reschedule your appointment if you arrive late (15 or more minutes).  Arriving late affects you and other patients whose appointments are after yours.  Also, if you miss three or more appointments without notifying the office, you may be dismissed from the clinic at the providers discretion.      For prescription refill requests, have your pharmacy contact our office and allow 72 hours for refills to be completed.    Today you received the following chemotherapy and/or immunotherapy agents: Oxaliplatin  (Eloxatin ), Irinotecan  (Camptosar ), leucovorin , Fluorouracil  (adrucil )   To help prevent nausea and vomiting after your treatment, we encourage you to take your nausea medication as directed.  BELOW ARE SYMPTOMS THAT SHOULD BE REPORTED IMMEDIATELY: *FEVER GREATER THAN 100.4 F (38 C) OR HIGHER *CHILLS OR SWEATING *NAUSEA AND VOMITING THAT IS NOT CONTROLLED WITH YOUR NAUSEA MEDICATION *UNUSUAL SHORTNESS OF BREATH *UNUSUAL BRUISING OR BLEEDING *URINARY PROBLEMS (pain or burning when urinating, or frequent urination) *BOWEL PROBLEMS (unusual diarrhea, constipation, pain near the anus) TENDERNESS IN MOUTH AND THROAT WITH OR WITHOUT PRESENCE OF ULCERS (sore throat, sores in mouth, or a toothache) UNUSUAL RASH, SWELLING OR PAIN  UNUSUAL VAGINAL DISCHARGE OR ITCHING   Items with * indicate a potential emergency and should be followed up as soon as possible or go to the Emergency Department if any problems  should occur.  Please show the CHEMOTHERAPY ALERT CARD or IMMUNOTHERAPY ALERT CARD at check-in to the Emergency Department and triage nurse.  Should you have questions after your visit or need to cancel or reschedule your appointment, please contact [facility] A DE[name]  Dept: [phone]  and follow the prompts.  Office hours are 8:00 a.m. to 4:30 p.m. Monday - Friday. Please note that voicemails left after 4:00 p.m. may not be returned until the following business day.  We are closed weekends and major holidays. You have access to a nurse at all times for urgent questions. Please call the main number to the clinic Dept: [phone] and follow the prompts.   For any non-urgent questions, you may also contact your provider using MyChart. We now offer e-Visits for anyone 1 and older to request care online for non-urgent symptoms. For details visit [email or web address].   Also download the MyChart app! Go to the app store, search MyChart, open the app, select [facility], and log in with your MyChart username and password.  The chemotherapy medication bag should finish at 46 hours, 96 hours, or 7 days. For example, if your pump is scheduled for 46 hours and it was put on at 4:00 p.m., it should finish at 2:00 p.m. the day it is scheduled to come off regardless of your appointment time.     Estimated time to finish at: 03/16/24 1:30pm   If the display on your pump reads Low Volume and it is beeping, take the batteries out of the pump and come to the cancer  center for it to be taken off.   If the pump alarms go off prior to the pump reading Low Volume then call [phone] and someone can assist you.  If the plunger comes out and the chemotherapy medication is leaking out, please use your home chemo spill kit to clean up the spill. Do NOT use paper towels or other household products.  If you have problems or questions regarding your pump,  please call either [phone] (24 hours a day) or the cancer center Monday-Friday 8:00 a.m.- 4:30 p.m. at the clinic number and we will assist you. If you are unable to get assistance, then go to the nearest Emergency Department and ask the staff to contact the IV team for assistance.

## 2024-03-14 NOTE — Telephone Encounter (Signed)
 Received telephone call from Harlene in the lab inquiring if Powell Lessen, NP wants the Folate RBC lab ordered x Powell Lessen, NP because it was not drawn initially in [facility]. Spoke with Powell Lessen, NP.  Let Harlene know Powell advised it is okay to disregard the lab order for  Folate RBC. Harlene voiced understanding.

## 2024-03-14 NOTE — Progress Notes (Signed)
 Patient tolerated treatment well, no complaints, ambulated from infusion center independently

## 2024-03-15 ENCOUNTER — Telehealth: Payer: Self-pay | Admitting: *Deleted

## 2024-03-15 LAB — CANCER ANTIGEN 19-9: CA 19-9: 584 U/mL — ABNORMAL HIGH (ref 0–35)

## 2024-03-15 NOTE — Telephone Encounter (Signed)
 Protocol [id number]: Effectiveness of Out-of-Pocket Cost COMmunication and Financial Navigation (CostCOM) in Cancer Patients ([id number])  Spoke with pt this morning to introduce the CostComm study. Pt stated he's interested and we can email him the consent and he'll read it. Informed pt this nurse will meet pt for his 03/28/24 infusion to discuss interest in the study and answer any questions. Pt is in agreement. A copy of the consent and HIPAA has been sent to [email or web address].   Mazie Larsen, RN, BSN Clinical Research Nurse [phone] 03/15/2024

## 2024-03-15 NOTE — Telephone Encounter (Signed)
 [phone]: Complementary Options for Symptom Management In Cancer (COSMIC) Assessing Benefits and Harms of Cannabis and Cannabinoid Use Among a Cohort of Cancer Patients Treated in [facility] with pt this morning to introduce above study. Pt stated he's interested and we can email him the consent and he'll read it. Informed pt this nurse will meet pt for his 03/28/24 infusion to discuss interest in the study and answer any questions. Pt is in agreement. A copy of the consent and HIPAA has been sent to [email or web address].   Mazie Larsen, RN, BSN Clinical Research Nurse [phone] 03/15/2024

## 2024-03-16 ENCOUNTER — Inpatient Hospital Stay

## 2024-03-16 ENCOUNTER — Other Ambulatory Visit: Payer: Self-pay | Admitting: Primary Care

## 2024-03-16 VITALS — BP 148/74 | HR 64 | Temp 98.1°F | Resp 18

## 2024-03-16 DIAGNOSIS — C252 Malignant neoplasm of tail of pancreas: Secondary | ICD-10-CM

## 2024-03-16 DIAGNOSIS — E291 Testicular hypofunction: Secondary | ICD-10-CM

## 2024-03-21 ENCOUNTER — Encounter: Attending: Internal Medicine | Admitting: Dietician

## 2024-03-21 VITALS — Wt 215.8 lb

## 2024-03-21 DIAGNOSIS — Z713 Dietary counseling and surveillance: Secondary | ICD-10-CM | POA: Diagnosis not present

## 2024-03-21 DIAGNOSIS — C801 Malignant (primary) neoplasm, unspecified: Secondary | ICD-10-CM

## 2024-03-21 DIAGNOSIS — Z6830 Body mass index (BMI) 30.0-30.9, adult: Secondary | ICD-10-CM | POA: Diagnosis not present

## 2024-03-21 DIAGNOSIS — E1165 Type 2 diabetes mellitus with hyperglycemia: Secondary | ICD-10-CM | POA: Insufficient documentation

## 2024-03-21 DIAGNOSIS — K8689 Other specified diseases of pancreas: Secondary | ICD-10-CM

## 2024-03-21 MED ORDER — METFORMIN HCL ER 500 MG PO TB24: 500.0000 mg | ORAL_TABLET | Freq: Every day | ORAL | 0 refills | Status: DC

## 2024-03-21 NOTE — Patient Instructions (Addendum)
 Eat a meal or snack every 3-4 hours during the day.  Include a protein food + a starch + a veg and/or fruit with as many meals as possible.  Try mixing 1 cup of Greek plain or vanilla yogurt with 1/3 cup peanut butter and 2 Tablespoons of honey. Use as a healthy dip for fruit or graham crackers.

## 2024-03-21 NOTE — Progress Notes (Signed)
 Diabetes Self-Management Education  Visit Type: First/Initial  Appt. Start Time: 1620 Appt. End Time: 1730  03/21/2024  [name], identified by name and date of birth, is a 74 y.o. male with a diagnosis of Diabetes: Type 2.   ASSESSMENT  There were no vitals taken for this visit. There is no height or weight on file to calculate BMI.   Diabetes Self-Management Education - 03/21/24 1633       Visit Information   Visit Type First/Initial      Initial Visit   Diabetes Type Type 2    Are you currently following a meal plan? Yes    Are you taking your medications as prescribed? Yes      Health Coping   How would you rate your overall health? Good      Psychosocial Assessment   Patient Belief/Attitude about Diabetes Motivated to manage diabetes    Self-care barriers None    Other persons present Spouse/SO    Special Needs None    Learning Readiness Change in progress    How often do you need to have someone help you when you read instructions, pamphlets, or other written materials from your doctor or pharmacy? 1 - Never    What is the last grade level you completed in school? master's degree      Pre-Education Assessment   Patient understands the diabetes disease and treatment process. Needs Instruction    Patient understands incorporating nutritional management into lifestyle. Needs Instruction    Patient undertands incorporating physical activity into lifestyle. Needs Instruction    Patient understands using medications safely. Needs Review    Patient understands monitoring blood glucose, interpreting and using results Needs Review    Patient understands prevention, detection, and treatment of acute complications. Needs Instruction    Patient understands prevention, detection, and treatment of chronic complications. Needs Instruction    Patient understands how to develop strategies to address psychosocial issues. Needs Instruction    Patient understands how to develop  strategies to promote health/change behavior. Comprehends key points      Complications   Last HgB A1C per patient/outside source 8.3 %    How often do you check your blood sugar? 0 times/day (not testing)    Have you had a dilated eye exam in the past 12 months? Yes    Have you had a dental exam in the past 12 months? Yes    Are you checking your feet? Yes    How many days per week are you checking your feet? 2      Dietary Intake   Snack (morning) Boost protein drink    Lunch honey wheat bread, protein, fruit (berries)    Snack (afternoon) peanut butter crackers    Dinner meat + veg, limited or no starch    Beverage(s) water, zero sugar ginger ale, coffee      Activity / Exercise   Activity / Exercise Type Light (walking / raking leaves)    How many days per week do you exercise? 4    How many minutes per day do you exercise? 20    Total minutes per week of exercise 80      Patient Education   Previous Diabetes Education No    Healthy Eating Role of diet in the treatment of diabetes and the relationship between the three main macronutrients and blood glucose level;Food label reading, portion sizes and measuring food.;Plate Method;Meal timing in regards to the patients' current diabetes medication.;Meal options  for control of blood glucose level and chronic complications.;Other (comment)   nutrition during chemo treatments; nutrition to increase platelets per patient question   Being Active Role of exercise on diabetes management, blood pressure control and cardiac health.    Medications Reviewed patients medication for diabetes, action, purpose, timing of dose and side effects.    Monitoring Purpose and frequency of SMBG.;Identified appropriate SMBG and/or A1C goals.    Acute complications Taught prevention, symptoms, and  treatment of hypoglycemia - the 15 rule.;Educated on sick day management    Diabetes Stress and Support Role of stress on diabetes      Individualized Goals  (developed by patient)   Nutrition General guidelines for healthy choices and portions discussed      Outcomes   Expected Outcomes Demonstrated interest in learning. Expect positive outcomes    Future DMSE PRN          Individualized Plan for Diabetes Self-Management Training:   Learning Objective:  Patient will have a greater understanding of diabetes self-management. Patient education plan is to attend individual and/or group sessions per assessed needs and concerns.   Plan:   Patient Instructions  Eat a meal or snack every 3-4 hours during the day.  Include a protein food + a starch + a veg and/or fruit with as many meals as possible.  Try mixing 1 cup of Greek plain or vanilla yogurt with 1/3 cup peanut butter and 2 Tablespoons of honey. Use as a healthy dip for fruit or graham crackers.   Expected Outcomes:  Demonstrated interest in learning. Expect positive outcomes  Education material provided: ADA - How to Thrive: A Guide for Your Journey with Diabetes; plate planner with food lists; Nutrition before, during, and after Cancer treatments ([facility])  If problems or questions, patient to contact team via:  Phone and patient portal  Future DSME appointment: PRN

## 2024-03-22 NOTE — Progress Notes (Signed)
 PATIENT NAVIGATOR PROGRESS NOTE  Name: [name] [name] Date: 03/22/2024 MRN: [id number]  DOB: 1950-08-10  Patient is established with a treatment plan and is actively engaged in care. Nurse Navigator services not currently indicated at this time. Will re-evaluate if needs change or if additional support is requested.

## 2024-03-23 ENCOUNTER — Ambulatory Visit: Payer: Medicare Other | Admitting: Family Medicine

## 2024-03-23 ENCOUNTER — Encounter: Payer: Self-pay | Admitting: Nurse Practitioner

## 2024-03-24 ENCOUNTER — Telehealth: Payer: Self-pay | Admitting: *Deleted

## 2024-03-24 ENCOUNTER — Other Ambulatory Visit: Payer: Self-pay | Admitting: Primary Care

## 2024-03-24 DIAGNOSIS — E1159 Type 2 diabetes mellitus with other circulatory complications: Secondary | ICD-10-CM

## 2024-03-24 NOTE — Telephone Encounter (Signed)
 Spoke with pt this morning about the Cost Comm study.  He stated he read the consent and is not interested at this time. He wants to wait and see if he will need financial help later on with the cost of treatment. Pt was thanked for his time and consideration.   Mazie Larsen, RN, BSN Clinical Research Nurse [phone] 03/24/2024

## 2024-03-26 ENCOUNTER — Encounter (HOSPITAL_COMMUNITY): Payer: Self-pay | Admitting: Emergency Medicine

## 2024-03-26 ENCOUNTER — Inpatient Hospital Stay (HOSPITAL_COMMUNITY)
Admission: EM | Admit: 2024-03-26 | Discharge: 2024-03-30 | DRG: 393 | Disposition: A | Discharge status: Home / Self Care | Attending: Internal Medicine | Admitting: Internal Medicine

## 2024-03-26 ENCOUNTER — Other Ambulatory Visit: Payer: Self-pay

## 2024-03-26 DIAGNOSIS — E291 Testicular hypofunction: Secondary | ICD-10-CM | POA: Diagnosis present

## 2024-03-26 DIAGNOSIS — C259 Malignant neoplasm of pancreas, unspecified: Secondary | ICD-10-CM | POA: Diagnosis present

## 2024-03-26 DIAGNOSIS — Z83438 Family history of other disorder of lipoprotein metabolism and other lipidemia: Secondary | ICD-10-CM

## 2024-03-26 DIAGNOSIS — T451X5A Adverse effect of antineoplastic and immunosuppressive drugs, initial encounter: Secondary | ICD-10-CM | POA: Diagnosis present

## 2024-03-26 DIAGNOSIS — E1140 Type 2 diabetes mellitus with diabetic neuropathy, unspecified: Secondary | ICD-10-CM | POA: Diagnosis present

## 2024-03-26 DIAGNOSIS — I4891 Unspecified atrial fibrillation: Secondary | ICD-10-CM | POA: Diagnosis present

## 2024-03-26 DIAGNOSIS — E66811 Obesity, class 1: Secondary | ICD-10-CM | POA: Diagnosis present

## 2024-03-26 DIAGNOSIS — E876 Hypokalemia: Secondary | ICD-10-CM | POA: Diagnosis present

## 2024-03-26 DIAGNOSIS — K521 Toxic gastroenteritis and colitis: Principal | ICD-10-CM | POA: Diagnosis present

## 2024-03-26 DIAGNOSIS — Z8546 Personal history of malignant neoplasm of prostate: Secondary | ICD-10-CM

## 2024-03-26 DIAGNOSIS — Z8639 Personal history of other endocrine, nutritional and metabolic disease: Secondary | ICD-10-CM

## 2024-03-26 DIAGNOSIS — E860 Dehydration: Secondary | ICD-10-CM | POA: Diagnosis present

## 2024-03-26 DIAGNOSIS — D61810 Antineoplastic chemotherapy induced pancytopenia: Secondary | ICD-10-CM | POA: Diagnosis present

## 2024-03-26 DIAGNOSIS — D61818 Other pancytopenia: Secondary | ICD-10-CM | POA: Diagnosis present

## 2024-03-26 DIAGNOSIS — I5032 Chronic diastolic (congestive) heart failure: Secondary | ICD-10-CM | POA: Diagnosis present

## 2024-03-26 DIAGNOSIS — Z6830 Body mass index (BMI) 30.0-30.9, adult: Secondary | ICD-10-CM

## 2024-03-26 DIAGNOSIS — D709 Neutropenia, unspecified: Principal | ICD-10-CM | POA: Diagnosis present

## 2024-03-26 DIAGNOSIS — I480 Paroxysmal atrial fibrillation: Secondary | ICD-10-CM | POA: Diagnosis present

## 2024-03-26 DIAGNOSIS — N179 Acute kidney failure, unspecified: Secondary | ICD-10-CM | POA: Diagnosis present

## 2024-03-26 DIAGNOSIS — Z79899 Other long term (current) drug therapy: Secondary | ICD-10-CM

## 2024-03-26 DIAGNOSIS — R197 Diarrhea, unspecified: Secondary | ICD-10-CM | POA: Diagnosis present

## 2024-03-26 DIAGNOSIS — R5081 Fever presenting with conditions classified elsewhere: Secondary | ICD-10-CM | POA: Diagnosis present

## 2024-03-26 DIAGNOSIS — E1165 Type 2 diabetes mellitus with hyperglycemia: Secondary | ICD-10-CM | POA: Diagnosis present

## 2024-03-26 DIAGNOSIS — C252 Malignant neoplasm of tail of pancreas: Secondary | ICD-10-CM

## 2024-03-26 DIAGNOSIS — E785 Hyperlipidemia, unspecified: Secondary | ICD-10-CM | POA: Diagnosis present

## 2024-03-26 DIAGNOSIS — Z86008 Personal history of in-situ neoplasm of other site: Secondary | ICD-10-CM

## 2024-03-26 DIAGNOSIS — D500 Iron deficiency anemia secondary to blood loss (chronic): Secondary | ICD-10-CM | POA: Diagnosis present

## 2024-03-26 DIAGNOSIS — D702 Other drug-induced agranulocytosis: Secondary | ICD-10-CM | POA: Diagnosis present

## 2024-03-26 DIAGNOSIS — Z1152 Encounter for screening for COVID-19: Secondary | ICD-10-CM

## 2024-03-26 DIAGNOSIS — Z8249 Family history of ischemic heart disease and other diseases of the circulatory system: Secondary | ICD-10-CM

## 2024-03-26 DIAGNOSIS — I110 Hypertensive heart disease with heart failure: Secondary | ICD-10-CM | POA: Diagnosis present

## 2024-03-26 DIAGNOSIS — G4733 Obstructive sleep apnea (adult) (pediatric): Secondary | ICD-10-CM | POA: Diagnosis present

## 2024-03-26 DIAGNOSIS — I10 Essential (primary) hypertension: Secondary | ICD-10-CM | POA: Diagnosis present

## 2024-03-26 DIAGNOSIS — Z7901 Long term (current) use of anticoagulants: Secondary | ICD-10-CM

## 2024-03-26 DIAGNOSIS — I2510 Atherosclerotic heart disease of native coronary artery without angina pectoris: Secondary | ICD-10-CM | POA: Diagnosis present

## 2024-03-26 LAB — COMPREHENSIVE METABOLIC PANEL WITH GFR
ALT: 9 U/L (ref 0–44)
AST: 14 U/L — ABNORMAL LOW (ref 15–41)
Albumin: 3.8 g/dL (ref 3.5–5.0)
Alkaline Phosphatase: 60 U/L (ref 38–126)
Anion gap: 13 (ref 5–15)
BUN: 19 mg/dL (ref 8–23)
CO2: 22 mmol/L (ref 22–32)
Calcium: 8.7 mg/dL — ABNORMAL LOW (ref 8.9–10.3)
Chloride: 102 mmol/L (ref 98–111)
Creatinine, Ser: 1.16 mg/dL (ref 0.61–1.24)
GFR, Estimated: >60 mL/min
Glucose, Bld: 180 mg/dL — ABNORMAL HIGH (ref 70–99)
Potassium: 2.7 mmol/L — CL (ref 3.5–5.1)
Sodium: 138 mmol/L (ref 135–145)
Total Bilirubin: 0.6 mg/dL (ref 0.0–1.2)
Total Protein: 6.6 g/dL (ref 6.5–8.1)

## 2024-03-26 LAB — CBC
HCT: 38.3 % — ABNORMAL LOW (ref 39.0–52.0)
Hemoglobin: 12.2 g/dL — ABNORMAL LOW (ref 13.0–17.0)
MCH: 27.8 pg (ref 26.0–34.0)
MCHC: 31.9 g/dL (ref 30.0–36.0)
MCV: 87.2 fL (ref 80.0–100.0)
Platelets: 92 K/uL — ABNORMAL LOW (ref 150–400)
RBC: 4.39 MIL/uL (ref 4.22–5.81)
RDW: 12.5 % (ref 11.5–15.5)
WBC: 1.5 K/uL — ABNORMAL LOW (ref 4.0–10.5)
nRBC: 0 % (ref 0.0–0.2)

## 2024-03-26 LAB — LIPASE, BLOOD: Lipase: 29 U/L (ref 11–51)

## 2024-03-26 MED ORDER — SODIUM CHLORIDE 0.9 % IV BOLUS
1000.0000 mL | Freq: Once | INTRAVENOUS | Status: AC
  Administered 2024-03-27: 1000 mL via INTRAVENOUS

## 2024-03-26 MED ORDER — POTASSIUM CHLORIDE 10 MEQ/100ML IV SOLN
10.0000 meq | INTRAVENOUS | Status: AC
  Administered 2024-03-27 (×2): 10 meq via INTRAVENOUS
  Filled 2024-03-26: qty 100

## 2024-03-26 NOTE — ED Triage Notes (Signed)
 Pt reports fevers of 100.4 that began today & diarrhea x 2 days. Reports he is a cancer pt and was told to come to ED if he has a fever.  Hx pancreatic cancer. Last chemo dec 29th.

## 2024-03-26 NOTE — ED Provider Notes (Signed)
 " [place] EMERGENCY DEPARTMENT AT [facility] ([place]) Provider Note  CSN: [id number] Arrival date & time: 03/26/24 2028  Chief Complaint(s) Fever and Diarrhea  History provided by patient. HPI & MDM [name] [name] is a 74 y.o. male with a past medical history listed below including pancreatic cancer currently undergoing chemotherapy.  First dose was 2 weeks ago.  He presented for 2 to 3 days of watery diarrhea.  Patient developed a fever this evening with a Tmax of 102.1.  He denied any coughing congestion.  No shortness of breath.  No abdominal pain.  No nausea or vomiting.  No urinary symptoms.  No known sick contacts..   Fever Associated symptoms: diarrhea   Diarrhea Associated symptoms: fever     Clinical Course as of 03/27/24 0403  Sun Mar 27, 2024  0045 Differential diagnosis considered.  Workup below.  Abdomen is benign so I have low suspicion for serious intra-abdominal inflammatory/infectious process requiring advanced imaging at this time.  Possibly a viral gastroenteritis.  CBC does show leukopenia.  Added differential to assess for neutropenia.  CMP with hypokalemia likely from GI losses.  No other electrolyte derangements.  Hyperglycemia without DKA.  No renal insufficiency.  No bili obstruction or pancreatitis.  Lactic acid normal.  Chest x-ray without evidence of pneumonia, pneumothorax, pulmonary edema pleural effusions.   IV repletion for hypokalemia started.  [PC]  0320 Viral panel was negative for COVID, influenza, RSV.  UA without evidence of infection.  Differential showed neutropenia with ANC of 600.  Patient started on empiric antibiotics.  Will consult medicine regarding admission for neutropenic fever [PC]  0400 Spoke with Dr. Dena who will admit patient  [PC]    Clinical Course User Index [PC] Ricky Gallery, Raynell Moder, MD   Medical Decision Making Amount and/or Complexity of Data Reviewed Labs: ordered. Decision-making details  documented in ED Course. Radiology: ordered and independent interpretation performed. Decision-making details documented in ED Course.  Risk OTC drugs. Prescription drug management. Decision regarding hospitalization.    Final Clinical Impression(s) / ED Diagnoses Final diagnoses:  Neutropenic fever  Diarrhea of presumed infectious origin  Hypokalemia     Past Medical History Past Medical History:  Diagnosis Date   Atrial fibrillation (HCC)    Cancer (HCC)    Pancreas   Clotting disorder July 2024   Lower GI Bleed   Coronary artery disease    Diabetes mellitus without complication (HCC)    Dyslipidemia    Dysrhythmia    A.Fib-on Eliquis    ED (erectile dysfunction)    Hyperlipidemia    Hyperparathyroidism    Hypertension    Hypogonadism, male    Melena 02/09/2024   Sleep apnea    Patient Active Problem List   Diagnosis Date Noted   Genetic testing 03/07/2024   Cancer (HCC)    Pancreatic cancer (HCC) 02/22/2024   Iron  deficiency anemia due to chronic blood loss 02/22/2024   Upper GI bleed 02/09/2024   AKI (acute kidney injury) 02/09/2024   Pancreatic mass 02/09/2024   Chronic diastolic CHF (congestive heart failure) (HCC) 02/09/2024   Symptomatic anemia 02/09/2024   Exertional dyspnea 05/13/2023   Hypokalemia 04/17/2023   Atrial fibrillation (HCC) 04/17/2023   Melanoma in situ (HCC) 02/03/2023   Squamous cell carcinoma of arm, left 09/16/2022   Squamous cell carcinoma in situ (SCCIS) of dorsum of hand 06/17/2022   Squamous cell carcinoma in situ (SCCIS) of skin of chest 08/21/2021   Type 2 diabetes mellitus with hyperglycemia (HCC) 10/16/2020  Aortic atherosclerosis 10/16/2020   History of GI diverticular bleed 02/14/2020   Diverticulosis 11/03/2019   Spinal stenosis 10/06/2019   Essential hypertension 01/17/2011   Hyperlipidemia 01/17/2011   Hypogonadism male 01/17/2011   Sleep apnea 01/17/2011   History of hyperparathyroidism 01/17/2011   ED  (erectile dysfunction) 01/17/2011   Obesity (BMI 30-39.9) 01/17/2011   Home Medication(s) Prior to Admission medications  Medication Sig Start Date End Date Taking? Authorizing Provider  Accu-Chek FastClix Lancets MISC 1 each by Does not apply route daily. 10/16/20   Joyce Norleen BROCKS, MD  amLODipine  (NORVASC ) 10 MG tablet TAKE 1 TABLET BY MOUTH EVERY DAY FOR BLOOD PRESSURE 03/24/24   Clark, Katherine K, NP  apixaban  (ELIQUIS ) 5 MG TABS tablet Take 1 tablet (5 mg total) by mouth 2 (two) times daily. 02/19/24   Burnette Fallow, MD  dapagliflozin  propanediol (FARXIGA ) 10 MG TABS tablet TAKE 1 TABLET (10 MG TOTAL) BY MOUTH DAILY BEFORE BREAKFAST. FOR DIABETES. 03/11/24   [name], NP  dexamethasone  (DECADRON ) 4 MG tablet Take 2 tablets (8 mg total) by mouth daily. Take 2 tablets daily x 3 days starting the day after chemotherapy. Take with food. 03/07/24   [name], MD  EPINEPHrine  0.3 mg/0.3 mL IJ SOAJ injection Inject 0.3 mg into the muscle as needed for anaphylaxis. 03/02/24   [name], NP  gabapentin  (NEURONTIN ) 300 MG capsule Take 300 mg by mouth 1 day or 1 dose. 06/19/23   [provider]  Glucosamine-Chondroit-Vit C-Mn (GLUCOSAMINE 1500 COMPLEX) CAPS Take 1 capsule by mouth daily.     [provider]  glucose blood (ACCU-CHEK GUIDE) test strip Use one daily to check blood sugar 10/16/20   Joyce Norleen BROCKS, MD  Iron , Ferrous Sulfate , 325 (65 Fe) MG TABS Take 65 mg by mouth daily with breakfast. 03/14/24 04/13/24  Hanford Powell BRAVO, NP  lidocaine -prilocaine  (EMLA ) cream Apply to affected area once 03/07/24   [name], MD  loperamide  (IMODIUM ) 2 MG capsule Take 2 tabs by mouth with first loose stool, then 1 tab with each additional loose stool as needed. Do not exceed 8 tabs in a 24-hour period 03/07/24   [name], MD  metFORMIN  (GLUCOPHAGE -XR) 500 MG 24 hr tablet Take 1 tablet (500 mg total) by mouth daily with breakfast. for diabetes. 03/21/24   Clark, Katherine K, NP   Multiple Vitamins-Minerals (MULTIVITAMIN WITH MINERALS) tablet Take 1 tablet by mouth daily with breakfast.    [provider]  ondansetron  (ZOFRAN ) 8 MG tablet Take 1 tablet (8 mg total) by mouth every 8 (eight) hours as needed for nausea or vomiting. Start on the third day after chemotherapy 03/07/24   [name], MD  pantoprazole  (PROTONIX ) 40 MG tablet Take 1 tablet (40 mg total) by mouth daily. 03/14/24 04/13/24  Hanford Powell BRAVO, NP  PRESCRIPTION MEDICATION CPAP- At bedtime    [provider]  prochlorperazine  (COMPAZINE ) 10 MG tablet Take 1 tablet (10 mg total) by mouth every 6 (six) hours as needed for nausea or vomiting (Nausea or vomiting). 03/07/24   [name], MD  rosuvastatin  (CRESTOR ) 40 MG tablet Take 1 tablet (40 mg total) by mouth at bedtime. 02/10/24   Cheryle Page, MD  tadalafil  (CIALIS ) 20 MG tablet Take 1 tablet (20 mg total) by mouth daily as needed for erectile dysfunction. 02/04/23   Joyce Norleen BROCKS, MD  Testosterone  20.25 MG/ACT (1.62%) GEL APPLY 3 PUMPS TO THE SHOULDER AND ARM DAILY 03/16/24   Clark, Katherine K, NP  Allergies Yellow jacket venom  Review of Systems Review of Systems  Constitutional:  Positive for fever.  Gastrointestinal:  Positive for diarrhea.   As noted in HPI  Physical Exam Vital Signs  I have reviewed the triage vital signs BP (!) 178/77   Pulse 92   Temp (!) 102.2 F (39 C) (Oral)   Resp 17   SpO2 98%   Physical Exam Vitals reviewed.  Constitutional:      General: He is not in acute distress.    Appearance: He is well-developed. He is not diaphoretic.  HENT:     Head: Normocephalic and atraumatic.     Nose: Nose normal.  Eyes:     General: No scleral icterus.       Right eye: No discharge.        Left eye: No discharge.     Conjunctiva/sclera: Conjunctivae normal.     Pupils:  Pupils are equal, round, and reactive to light.  Cardiovascular:     Rate and Rhythm: Normal rate and regular rhythm.     Heart sounds: No murmur heard.    No friction rub. No gallop.  Pulmonary:     Effort: Pulmonary effort is normal. No respiratory distress.     Breath sounds: Normal breath sounds. No stridor. No rales.  Abdominal:     General: There is no distension.     Palpations: Abdomen is soft.     Tenderness: There is no abdominal tenderness. There is no guarding or rebound.  Musculoskeletal:        General: No tenderness.     Cervical back: Normal range of motion and neck supple.  Skin:    General: Skin is warm and dry.     Findings: No erythema or rash.      Neurological:     Mental Status: He is alert and oriented to person, place, and time.     ED Results and Treatments Labs (all labs ordered are listed, but only abnormal results are displayed) Labs Reviewed  COMPREHENSIVE METABOLIC PANEL WITH GFR - Abnormal; Notable for the following components:      Result Value   Potassium 2.7 (*)    Glucose, Bld 180 (*)    Calcium  8.7 (*)    AST 14 (*)    All other components within normal limits  CBC - Abnormal; Notable for the following components:   WBC 1.5 (*)    Hemoglobin 12.2 (*)    HCT 38.3 (*)    Platelets 92 (*)    All other components within normal limits  URINALYSIS, ROUTINE W REFLEX MICROSCOPIC - Abnormal; Notable for the following components:   APPearance HAZY (*)    Glucose, UA >=500 (*)    Ketones, ur 20 (*)    Protein, ur 30 (*)    All other components within normal limits  DIFFERENTIAL - Abnormal; Notable for the following components:   Neutro Abs 0.6 (*)    Lymphs Abs 0.3 (*)    All other components within normal limits  I-STAT CHEM 8, ED - Abnormal; Notable for the following components:   Potassium 2.8 (*)    Glucose, Bld 153 (*)    Calcium , Ion 1.08 (*)    TCO2 20 (*)    Hemoglobin 11.9 (*)    HCT 35.0 (*)    All other components within  normal limits  RESP PANEL BY RT-PCR (RSV, FLU A&B, COVID)  RVPGX2  CULTURE, BLOOD (ROUTINE X 2)  CULTURE, BLOOD (  ROUTINE X 2)  LIPASE, BLOOD  I-STAT CG4 LACTIC ACID, ED                                                                                                                         EKG  EKG Interpretation Date/Time:  Sunday March 27 2024 03:51:24 EST Ventricular Rate:  92 PR Interval:  134 QRS Duration:  118 QT Interval:  470 QTC Calculation: 582 R Axis:   40  Text Interpretation: Sinus rhythm Atrial premature complex Sinus pause Nonspecific intraventricular conduction delay Borderline low voltage, extremity leads Confirmed by Trine Likes [phone]) on 03/27/2024 4:04:23 AM       Radiology DG Chest 2 View Result Date: 03/27/2024 EXAM: 2 VIEW(S) XRAY OF THE CHEST 03/27/2024 12:13:00 AM COMPARISON: 03/07/2024 CLINICAL HISTORY: Fever FINDINGS: LINES, TUBES AND DEVICES: Right chest Port-A-Cath in place with tip overlying the expected region of the mid superior vena cava. LUNGS AND PLEURA: No focal pulmonary opacity. Blunting of bilateral costophrenic angles. No pneumothorax. HEART AND MEDIASTINUM: No acute abnormality of the cardiac and mediastinal silhouettes. Aortic atherosclerosis. BONES AND SOFT TISSUES: No acute osseous abnormality. IMPRESSION: 1. Blunting of bilateral costophrenic angles. Possible trace pleural effusion versus scarring. 2. Right chest Port-A-Cath with tip overlying the mid superior vena cava. Electronically signed by: Morgane Naveau MD MD 03/27/2024 12:16 AM EST RP Workstation: [id number]    Medications Ordered in ED Medications  sodium chloride  0.9 % bolus 1,000 mL (0 mLs Intravenous Stopped 03/27/24 0248)  potassium chloride  10 mEq in 100 mL IVPB (0 mEq Intravenous Stopped 03/27/24 0226)  acetaminophen  (TYLENOL ) tablet 1,000 mg (1,000 mg Oral Given 03/27/24 0238)  ceFEPIme  (MAXIPIME ) 2 g in sodium chloride  0.9 % 100 mL IVPB (2 g Intravenous New Bag/Given  03/27/24 0301)  0.9 %  sodium chloride  infusion ( Intravenous New Bag/Given 03/27/24 0316)   Procedures .Critical Care  Performed by: Trine Likes Moder, MD Authorized by: Trine Likes Moder, MD   Critical care provider statement:    Critical care time (minutes):  45   Critical care time was exclusive of:  Separately billable procedures and treating other patients   Critical care was necessary to treat or prevent imminent or life-threatening deterioration of the following conditions: neutropenic fever requiring admission.   Critical care was time spent personally by me on the following activities:  Development of treatment plan with patient or surrogate, discussions with consultants, evaluation of patient's response to treatment, examination of patient, obtaining history from patient or surrogate, review of old charts, re-evaluation of patient's condition, pulse oximetry, ordering and review of radiographic studies, ordering and review of laboratory studies and ordering and performing treatments and interventions   Care discussed with: admitting provider     (including critical care time)   This chart was dictated using voice recognition software.  Despite best efforts to proofread,  errors can occur which can change the documentation meaning.   Trine Likes Moder, MD 03/27/24 [phone]  "

## 2024-03-27 ENCOUNTER — Other Ambulatory Visit: Payer: Self-pay

## 2024-03-27 ENCOUNTER — Emergency Department (HOSPITAL_COMMUNITY)

## 2024-03-27 ENCOUNTER — Encounter (HOSPITAL_COMMUNITY): Payer: Self-pay | Admitting: Internal Medicine

## 2024-03-27 DIAGNOSIS — Z1152 Encounter for screening for COVID-19: Secondary | ICD-10-CM | POA: Diagnosis not present

## 2024-03-27 DIAGNOSIS — Z7901 Long term (current) use of anticoagulants: Secondary | ICD-10-CM | POA: Diagnosis not present

## 2024-03-27 DIAGNOSIS — R112 Nausea with vomiting, unspecified: Secondary | ICD-10-CM | POA: Insufficient documentation

## 2024-03-27 DIAGNOSIS — T451X5A Adverse effect of antineoplastic and immunosuppressive drugs, initial encounter: Secondary | ICD-10-CM | POA: Diagnosis present

## 2024-03-27 DIAGNOSIS — D709 Neutropenia, unspecified: Principal | ICD-10-CM | POA: Diagnosis present

## 2024-03-27 DIAGNOSIS — G4733 Obstructive sleep apnea (adult) (pediatric): Secondary | ICD-10-CM | POA: Diagnosis present

## 2024-03-27 DIAGNOSIS — N179 Acute kidney failure, unspecified: Secondary | ICD-10-CM | POA: Diagnosis present

## 2024-03-27 DIAGNOSIS — R197 Diarrhea, unspecified: Secondary | ICD-10-CM | POA: Diagnosis present

## 2024-03-27 DIAGNOSIS — E876 Hypokalemia: Secondary | ICD-10-CM | POA: Diagnosis present

## 2024-03-27 DIAGNOSIS — Z6830 Body mass index (BMI) 30.0-30.9, adult: Secondary | ICD-10-CM | POA: Diagnosis not present

## 2024-03-27 DIAGNOSIS — R5081 Fever presenting with conditions classified elsewhere: Secondary | ICD-10-CM

## 2024-03-27 DIAGNOSIS — E785 Hyperlipidemia, unspecified: Secondary | ICD-10-CM | POA: Diagnosis present

## 2024-03-27 DIAGNOSIS — E1140 Type 2 diabetes mellitus with diabetic neuropathy, unspecified: Secondary | ICD-10-CM | POA: Diagnosis present

## 2024-03-27 DIAGNOSIS — I110 Hypertensive heart disease with heart failure: Secondary | ICD-10-CM | POA: Diagnosis present

## 2024-03-27 DIAGNOSIS — E66811 Obesity, class 1: Secondary | ICD-10-CM | POA: Diagnosis present

## 2024-03-27 DIAGNOSIS — E1165 Type 2 diabetes mellitus with hyperglycemia: Secondary | ICD-10-CM | POA: Diagnosis present

## 2024-03-27 DIAGNOSIS — Z8249 Family history of ischemic heart disease and other diseases of the circulatory system: Secondary | ICD-10-CM | POA: Diagnosis not present

## 2024-03-27 DIAGNOSIS — C259 Malignant neoplasm of pancreas, unspecified: Secondary | ICD-10-CM | POA: Diagnosis present

## 2024-03-27 DIAGNOSIS — I4891 Unspecified atrial fibrillation: Secondary | ICD-10-CM | POA: Diagnosis present

## 2024-03-27 DIAGNOSIS — I2510 Atherosclerotic heart disease of native coronary artery without angina pectoris: Secondary | ICD-10-CM | POA: Diagnosis present

## 2024-03-27 DIAGNOSIS — K521 Toxic gastroenteritis and colitis: Secondary | ICD-10-CM | POA: Diagnosis present

## 2024-03-27 DIAGNOSIS — R509 Fever, unspecified: Secondary | ICD-10-CM | POA: Diagnosis present

## 2024-03-27 DIAGNOSIS — E860 Dehydration: Secondary | ICD-10-CM | POA: Diagnosis present

## 2024-03-27 DIAGNOSIS — D61810 Antineoplastic chemotherapy induced pancytopenia: Secondary | ICD-10-CM | POA: Diagnosis present

## 2024-03-27 DIAGNOSIS — D702 Other drug-induced agranulocytosis: Secondary | ICD-10-CM | POA: Diagnosis present

## 2024-03-27 DIAGNOSIS — I5032 Chronic diastolic (congestive) heart failure: Secondary | ICD-10-CM | POA: Diagnosis present

## 2024-03-27 LAB — BASIC METABOLIC PANEL WITH GFR
Anion gap: 14 (ref 5–15)
BUN: 20 mg/dL (ref 8–23)
CO2: 17 mmol/L — ABNORMAL LOW (ref 22–32)
Calcium: 7.9 mg/dL — ABNORMAL LOW (ref 8.9–10.3)
Chloride: 106 mmol/L (ref 98–111)
Creatinine, Ser: 1.37 mg/dL — ABNORMAL HIGH (ref 0.61–1.24)
GFR, Estimated: 54 mL/min — ABNORMAL LOW
Glucose, Bld: 139 mg/dL — ABNORMAL HIGH (ref 70–99)
Potassium: 3 mmol/L — ABNORMAL LOW (ref 3.5–5.1)
Sodium: 136 mmol/L (ref 135–145)

## 2024-03-27 LAB — C DIFFICILE QUICK SCREEN W PCR REFLEX
C Diff antigen: NEGATIVE
C Diff interpretation: NOT DETECTED
C Diff toxin: NEGATIVE

## 2024-03-27 LAB — URINALYSIS, ROUTINE W REFLEX MICROSCOPIC
Bacteria, UA: NONE SEEN
Bilirubin Urine: NEGATIVE
Glucose, UA: >=500 mg/dL — AB
Hgb urine dipstick: NEGATIVE
Ketones, ur: 20 mg/dL — AB
Leukocytes,Ua: NEGATIVE
Nitrite: NEGATIVE
Protein, ur: 30 mg/dL — AB
Specific Gravity, Urine: 1.029 (ref 1.005–1.030)
pH: 5 (ref 5.0–8.0)

## 2024-03-27 LAB — I-STAT CG4 LACTIC ACID, ED: Lactic Acid, Venous: 0.7 mmol/L (ref 0.5–1.9)

## 2024-03-27 LAB — DIFFERENTIAL
Abs Immature Granulocytes: 0.02 K/uL (ref 0.00–0.07)
Basophils Absolute: 0 K/uL (ref 0.0–0.1)
Basophils Relative: 1 %
Eosinophils Absolute: 0 K/uL (ref 0.0–0.5)
Eosinophils Relative: 0 %
Immature Granulocytes: 2 %
Lymphocytes Relative: 19 %
Lymphs Abs: 0.3 K/uL — ABNORMAL LOW (ref 0.7–4.0)
Monocytes Absolute: 0.4 K/uL (ref 0.1–1.0)
Monocytes Relative: 32 %
Neutro Abs: 0.6 K/uL — ABNORMAL LOW (ref 1.7–7.7)
Neutrophils Relative %: 46 %

## 2024-03-27 LAB — CBC
HCT: 33.5 % — ABNORMAL LOW (ref 39.0–52.0)
Hemoglobin: 10.6 g/dL — ABNORMAL LOW (ref 13.0–17.0)
MCH: 27.9 pg (ref 26.0–34.0)
MCHC: 31.6 g/dL (ref 30.0–36.0)
MCV: 88.2 fL (ref 80.0–100.0)
Platelets: 76 K/uL — ABNORMAL LOW (ref 150–400)
RBC: 3.8 MIL/uL — ABNORMAL LOW (ref 4.22–5.81)
RDW: 12.7 % (ref 11.5–15.5)
WBC: 1.1 K/uL — CL (ref 4.0–10.5)
nRBC: 0 % (ref 0.0–0.2)

## 2024-03-27 LAB — I-STAT CHEM 8, ED
BUN: 19 mg/dL (ref 8–23)
Calcium, Ion: 1.08 mmol/L — ABNORMAL LOW (ref 1.15–1.40)
Chloride: 102 mmol/L (ref 98–111)
Creatinine, Ser: 1.2 mg/dL (ref 0.61–1.24)
Glucose, Bld: 153 mg/dL — ABNORMAL HIGH (ref 70–99)
HCT: 35 % — ABNORMAL LOW (ref 39.0–52.0)
Hemoglobin: 11.9 g/dL — ABNORMAL LOW (ref 13.0–17.0)
Potassium: 2.8 mmol/L — ABNORMAL LOW (ref 3.5–5.1)
Sodium: 139 mmol/L (ref 135–145)
TCO2: 20 mmol/L — ABNORMAL LOW (ref 22–32)

## 2024-03-27 LAB — RESP PANEL BY RT-PCR (RSV, FLU A&B, COVID)  RVPGX2
Influenza A by PCR: NEGATIVE
Influenza B by PCR: NEGATIVE
Resp Syncytial Virus by PCR: NEGATIVE
SARS Coronavirus 2 by RT PCR: NEGATIVE

## 2024-03-27 LAB — CBG MONITORING, ED
Glucose-Capillary: 103 mg/dL — ABNORMAL HIGH (ref 70–99)
Glucose-Capillary: 131 mg/dL — ABNORMAL HIGH (ref 70–99)

## 2024-03-27 LAB — PHOSPHORUS: Phosphorus: 3 mg/dL (ref 2.5–4.6)

## 2024-03-27 LAB — HEMOGLOBIN A1C
Hgb A1c MFr Bld: 7.4 % — ABNORMAL HIGH (ref 4.8–5.6)
Mean Plasma Glucose: 165.68 mg/dL

## 2024-03-27 LAB — MAGNESIUM: Magnesium: 1.5 mg/dL — ABNORMAL LOW (ref 1.7–2.4)

## 2024-03-27 LAB — GLUCOSE, CAPILLARY
Glucose-Capillary: 125 mg/dL — ABNORMAL HIGH (ref 70–99)
Glucose-Capillary: 163 mg/dL — ABNORMAL HIGH (ref 70–99)

## 2024-03-27 LAB — CREATININE, SERUM
Creatinine, Ser: 1.24 mg/dL (ref 0.61–1.24)
GFR, Estimated: >60 mL/min

## 2024-03-27 MED ORDER — SODIUM CHLORIDE 0.9% FLUSH
3.0000 mL | Freq: Two times a day (BID) | INTRAVENOUS | Status: DC
  Administered 2024-03-27 – 2024-03-30 (×4): 3 mL via INTRAVENOUS

## 2024-03-27 MED ORDER — IPRATROPIUM BROMIDE 0.02 % IN SOLN: 0.5000 mg | Freq: Four times a day (QID) | RESPIRATORY_TRACT | Status: DC | PRN

## 2024-03-27 MED ORDER — OXYCODONE HCL 5 MG PO TABS: 5.0000 mg | ORAL_TABLET | ORAL | Status: DC | PRN

## 2024-03-27 MED ORDER — SODIUM CHLORIDE 0.9 % IV SOLN
2.0000 g | Freq: Once | INTRAVENOUS | Status: AC
  Administered 2024-03-27: 2 g via INTRAVENOUS
  Filled 2024-03-27: qty 12.5

## 2024-03-27 MED ORDER — ONDANSETRON HCL 4 MG PO TABS: 4.0000 mg | ORAL_TABLET | Freq: Four times a day (QID) | ORAL | Status: DC | PRN

## 2024-03-27 MED ORDER — ENSURE PLUS HIGH PROTEIN PO LIQD: 237.0000 mL | Freq: Two times a day (BID) | ORAL | Status: DC

## 2024-03-27 MED ORDER — ENOXAPARIN SODIUM 40 MG/0.4ML IJ SOSY: 40.0000 mg | PREFILLED_SYRINGE | INTRAMUSCULAR | Status: DC

## 2024-03-27 MED ORDER — GABAPENTIN 300 MG PO CAPS
300.0000 mg | ORAL_CAPSULE | ORAL | Status: DC
  Administered 2024-03-27 – 2024-03-29 (×3): 300 mg via ORAL
  Filled 2024-03-27 (×3): qty 1

## 2024-03-27 MED ORDER — ACETAMINOPHEN 325 MG PO TABS
650.0000 mg | ORAL_TABLET | Freq: Four times a day (QID) | ORAL | Status: DC | PRN
  Administered 2024-03-27: 650 mg via ORAL
  Filled 2024-03-27: qty 2

## 2024-03-27 MED ORDER — FERROUS SULFATE 325 (65 FE) MG PO TABS
325.0000 mg | ORAL_TABLET | Freq: Every day | ORAL | Status: DC
  Administered 2024-03-27 – 2024-03-30 (×4): 325 mg via ORAL
  Filled 2024-03-27 (×5): qty 1

## 2024-03-27 MED ORDER — ENSURE PLUS HIGH PROTEIN PO LIQD
237.0000 mL | Freq: Two times a day (BID) | ORAL | Status: DC
  Administered 2024-03-28: 237 mL via ORAL

## 2024-03-27 MED ORDER — HYDRALAZINE HCL 20 MG/ML IJ SOLN: 10.0000 mg | INTRAMUSCULAR | Status: DC | PRN

## 2024-03-27 MED ORDER — POTASSIUM CHLORIDE IN NACL 20-0.9 MEQ/L-% IV SOLN
INTRAVENOUS | Status: DC
  Filled 2024-03-27 (×2): qty 1000

## 2024-03-27 MED ORDER — METFORMIN HCL ER 500 MG PO TB24
500.0000 mg | ORAL_TABLET | Freq: Two times a day (BID) | ORAL | Status: DC
  Administered 2024-03-27 (×2): 500 mg via ORAL
  Filled 2024-03-27 (×3): qty 1

## 2024-03-27 MED ORDER — HYDROMORPHONE HCL 1 MG/ML IJ SOLN: 0.5000 mg | INTRAMUSCULAR | Status: DC | PRN

## 2024-03-27 MED ORDER — FLEET ENEMA RE ENEM: 1.0000 | ENEMA | Freq: Once | RECTAL | Status: DC | PRN

## 2024-03-27 MED ORDER — HEPARIN SODIUM (PORCINE) 5000 UNIT/ML IJ SOLN: 5000.0000 [IU] | Freq: Three times a day (TID) | INTRAMUSCULAR | Status: DC

## 2024-03-27 MED ORDER — SENNOSIDES-DOCUSATE SODIUM 8.6-50 MG PO TABS: 1.0000 | ORAL_TABLET | Freq: Every evening | ORAL | Status: DC | PRN

## 2024-03-27 MED ORDER — SACCHAROMYCES BOULARDII 250 MG PO CAPS
250.0000 mg | ORAL_CAPSULE | Freq: Two times a day (BID) | ORAL | Status: DC
  Administered 2024-03-27 – 2024-03-30 (×6): 250 mg via ORAL
  Filled 2024-03-27 (×7): qty 1

## 2024-03-27 MED ORDER — ROSUVASTATIN CALCIUM 20 MG PO TABS
40.0000 mg | ORAL_TABLET | Freq: Every day | ORAL | Status: DC
  Administered 2024-03-28 – 2024-03-29 (×2): 40 mg via ORAL
  Filled 2024-03-27 (×2): qty 2

## 2024-03-27 MED ORDER — MAGNESIUM SULFATE 2 GM/50ML IV SOLN
2.0000 g | Freq: Once | INTRAVENOUS | Status: AC
  Administered 2024-03-27: 2 g via INTRAVENOUS
  Filled 2024-03-27: qty 50

## 2024-03-27 MED ORDER — TRAZODONE HCL 50 MG PO TABS: 25.0000 mg | ORAL_TABLET | Freq: Every evening | ORAL | Status: DC | PRN

## 2024-03-27 MED ORDER — ONDANSETRON HCL 4 MG/2ML IJ SOLN: 4.0000 mg | Freq: Four times a day (QID) | INTRAMUSCULAR | Status: DC | PRN

## 2024-03-27 MED ORDER — POTASSIUM CHLORIDE 10 MEQ/100ML IV SOLN
10.0000 meq | INTRAVENOUS | Status: AC
  Administered 2024-03-27 (×2): 10 meq via INTRAVENOUS
  Filled 2024-03-27 (×2): qty 100

## 2024-03-27 MED ORDER — INSULIN ASPART 100 UNIT/ML IJ SOLN: 0.0000 [IU] | Freq: Three times a day (TID) | INTRAMUSCULAR | Status: DC

## 2024-03-27 MED ORDER — APIXABAN 5 MG PO TABS
5.0000 mg | ORAL_TABLET | Freq: Two times a day (BID) | ORAL | Status: DC
  Administered 2024-03-27 – 2024-03-30 (×7): 5 mg via ORAL
  Filled 2024-03-27 (×7): qty 1

## 2024-03-27 MED ORDER — SODIUM CHLORIDE 0.9 % IV SOLN: INTRAVENOUS | Status: DC

## 2024-03-27 MED ORDER — ACETAMINOPHEN 650 MG RE SUPP: 650.0000 mg | Freq: Four times a day (QID) | RECTAL | Status: DC | PRN

## 2024-03-27 MED ORDER — DAPAGLIFLOZIN PROPANEDIOL 5 MG PO TABS
5.0000 mg | ORAL_TABLET | Freq: Every day | ORAL | Status: DC
  Administered 2024-03-27: 5 mg via ORAL
  Filled 2024-03-27 (×2): qty 1

## 2024-03-27 MED ORDER — SODIUM CHLORIDE 0.9 % IV SOLN
2.0000 g | Freq: Three times a day (TID) | INTRAVENOUS | Status: DC
  Administered 2024-03-27 – 2024-03-30 (×10): 2 g via INTRAVENOUS
  Filled 2024-03-27 (×10): qty 12.5

## 2024-03-27 MED ORDER — SODIUM CHLORIDE 0.9 % IV SOLN: Freq: Once | INTRAVENOUS | Status: AC

## 2024-03-27 MED ORDER — POTASSIUM CHLORIDE CRYS ER 20 MEQ PO TBCR
40.0000 meq | EXTENDED_RELEASE_TABLET | ORAL | Status: AC
  Administered 2024-03-27 (×2): 40 meq via ORAL
  Filled 2024-03-27 (×2): qty 2

## 2024-03-27 MED ORDER — IRON (FERROUS SULFATE) 325 (65 FE) MG PO TABS: 65.0000 mg | ORAL_TABLET | Freq: Every day | ORAL | Status: DC

## 2024-03-27 MED ORDER — AMLODIPINE BESYLATE 5 MG PO TABS
5.0000 mg | ORAL_TABLET | Freq: Every day | ORAL | Status: DC
  Administered 2024-03-27: 5 mg via ORAL
  Filled 2024-03-27: qty 1

## 2024-03-27 MED ORDER — VANCOMYCIN 50 MG/ML ORAL SOLUTION
125.0000 mg | Freq: Four times a day (QID) | ORAL | Status: DC
  Administered 2024-03-27 – 2024-03-28 (×5): 125 mg via ORAL
  Filled 2024-03-27 (×7): qty 2.5

## 2024-03-27 MED ORDER — ACETAMINOPHEN 500 MG PO TABS
1000.0000 mg | ORAL_TABLET | Freq: Once | ORAL | Status: AC
  Administered 2024-03-27: 1000 mg via ORAL
  Filled 2024-03-27: qty 2

## 2024-03-27 MED ORDER — BISACODYL 5 MG PO TBEC: 5.0000 mg | DELAYED_RELEASE_TABLET | Freq: Every day | ORAL | Status: DC | PRN

## 2024-03-27 NOTE — Assessment & Plan Note (Signed)
 Controlled diabetes mellitus type 2 with last A1c : 8.3 -Intolerance to insulin  -Continue home medication of metformin , Farxiga 

## 2024-03-27 NOTE — Assessment & Plan Note (Signed)
 Monitoring closely, currently dehydrated, volume depleted Due to progressive diarrhea -Continue gentle IV fluid hydration, avoiding volume overload - Monitoring I's and O's, daily weight  Last echo 10/04/2023, EF 60-65%, normal LV function, small pericardial effusion,

## 2024-03-27 NOTE — Hospital Course (Signed)
" °  [name] [name] is a 74 year old male with history of A-fib, DM2, HLD, HTN, remote history of prostate cancer, chronic iron  deficiency, diastolic CHF, hyperparathyroidism.. With ongoing pancreatic cancer currently under chemotherapy, first dose 2 weeks ago presenting with 2-3 days of watery diarrhea.  Also reporting of fever with Tmax of 102.1. Denied of having any upper respiratory symptoms including cough, congestion.  Or shortness of breath.  Denies any abdominal pain, nausea or vomiting.    ED Evaluation:  Blood pressure (!) 161/76, pulse 92, temperature 99.4 F (37.4 C), resp. rate (!) 30, SpO2 94%.  LABs: WBC 1.1, Hgb: 10.6, platelets 76, potassium 2.7 -   2.8, lactic acid 0.7, B12 416, ferritin 49, reticulocyte 1.24, Upper respiratory viral panel negative, hepatitis panel reactive, UA: Remarkable  "

## 2024-03-27 NOTE — Assessment & Plan Note (Signed)
 Continue statins

## 2024-03-27 NOTE — Assessment & Plan Note (Signed)
Continue testosterone supplement 

## 2024-03-27 NOTE — H&P (Signed)
 " History and Physical   Patient: [name] [name]                            PCP: [name], NP                    DOB: 1950/04/05            DOA: 03/26/2024 FMW:[id number]             DOS: 03/27/2024, 9:12 AM  [name], NP  Patient coming from:   HOME  I have personally reviewed patient's medical records, in electronic medical records, including:  [place] link, and care everywhere.    Chief Complaint:   Chief Complaint  Patient presents with   Fever   Diarrhea    History of present illness:     [name] is a 74 year old male with history of A-fib, DM2, HLD, HTN, remote history of prostate cancer, chronic iron  deficiency, diastolic CHF, hyperparathyroidism.. With ongoing pancreatic cancer currently under chemotherapy, first dose 2 weeks ago presenting with 2-3 days of watery diarrhea.  Also reporting of fever with Tmax of 102.1. Denied of having any upper respiratory symptoms including cough, congestion.  Or shortness of breath.  Denies any abdominal pain, nausea or vomiting.    ED Evaluation:  Blood pressure (!) 161/76, pulse 92, temperature 99.4 F (37.4 C), resp. rate (!) 30, SpO2 94%.  LABs: WBC 1.1, Hgb: 10.6, platelets 76, potassium 2.7 -   2.8, lactic acid 0.7, B12 416, ferritin 49, reticulocyte 1.24, Upper respiratory viral panel negative, hepatitis panel reactive, UA: Remarkable   Patient Denies having: Fever, Chills, Cough, SOB, Chest Pain, Abd pain, headache, dizziness, lightheadedness,  Dysuria, Joint pain, rash, open wounds  Review of Systems: As per HPI, otherwise 10 point review of systems were negative.   ----------------------------------------------------------------------------------------------------------------------  Allergies[1]  Home MEDs:  Prior to Admission medications  Medication Sig Start Date End Date Taking? Authorizing Provider  Accu-Chek FastClix Lancets MISC 1 each by Does not apply route daily. 10/16/20  Yes  Joyce Norleen BROCKS, MD  amLODipine  (NORVASC ) 10 MG tablet TAKE 1 TABLET BY MOUTH EVERY DAY FOR BLOOD PRESSURE 03/24/24  Yes [name], NP  apixaban  (ELIQUIS ) 5 MG TABS tablet Take 1 tablet (5 mg total) by mouth 2 (two) times daily. 02/19/24  Yes Burnette Fallow, MD  dapagliflozin  propanediol (FARXIGA ) 10 MG TABS tablet TAKE 1 TABLET (10 MG TOTAL) BY MOUTH DAILY BEFORE BREAKFAST. FOR DIABETES. 03/11/24  Yes [name], NP  dexamethasone  (DECADRON ) 4 MG tablet Take 2 tablets (8 mg total) by mouth daily. Take 2 tablets daily x 3 days starting the day after chemotherapy. Take with food. 03/07/24  Yes [name], MD  EPINEPHrine  0.3 mg/0.3 mL IJ SOAJ injection Inject 0.3 mg into the muscle as needed for anaphylaxis. 03/02/24  Yes Clark, Katherine K, NP  gabapentin  (NEURONTIN ) 300 MG capsule Take 300 mg by mouth 1 day or 1 dose. 06/19/23  Yes [provider]  glucose blood (ACCU-CHEK GUIDE) test strip Use one daily to check blood sugar 10/16/20  Yes Joyce Norleen BROCKS, MD  Iron , Ferrous Sulfate , 325 (65 Fe) MG TABS Take 65 mg by mouth daily with breakfast. 03/14/24 04/13/24 Yes Hanford Powell BRAVO, NP  lidocaine -prilocaine  (EMLA ) cream Apply to affected area once 03/07/24  Yes [name], MD  loperamide  (IMODIUM ) 2 MG capsule Take 2 tabs by mouth with  first loose stool, then 1 tab with each additional loose stool as needed. Do not exceed 8 tabs in a 24-hour period 03/07/24  Yes [name], MD  metFORMIN  (GLUCOPHAGE -XR) 500 MG 24 hr tablet Take 1 tablet (500 mg total) by mouth daily with breakfast. for diabetes. 03/21/24  Yes Clark, Katherine K, NP  ondansetron  (ZOFRAN ) 8 MG tablet Take 1 tablet (8 mg total) by mouth every 8 (eight) hours as needed for nausea or vomiting. Start on the third day after chemotherapy 03/07/24  Yes [name], MD  pantoprazole  (PROTONIX ) 40 MG tablet Take 1 tablet (40 mg total) by mouth daily. 03/14/24 04/13/24 Yes Boscia, Powell BRAVO, NP  PRESCRIPTION MEDICATION CPAP- At bedtime    Yes [provider]  prochlorperazine  (COMPAZINE ) 10 MG tablet Take 1 tablet (10 mg total) by mouth every 6 (six) hours as needed for nausea or vomiting (Nausea or vomiting). 03/07/24  Yes [name], MD  rosuvastatin  (CRESTOR ) 40 MG tablet Take 1 tablet (40 mg total) by mouth at bedtime. 02/10/24  Yes Cheryle Page, MD  Testosterone  20.25 MG/ACT (1.62%) GEL APPLY 3 PUMPS TO THE SHOULDER AND ARM DAILY 03/16/24  Yes [name], NP  Glucosamine-Chondroit-Vit C-Mn (GLUCOSAMINE 1500 COMPLEX) CAPS Take 1 capsule by mouth daily.  Patient not taking: Reported on 03/27/2024    [provider]  Multiple Vitamins-Minerals (MULTIVITAMIN WITH MINERALS) tablet Take 1 tablet by mouth daily with breakfast. Patient not taking: Reported on 03/27/2024    [provider]  tadalafil  (CIALIS ) 20 MG tablet Take 1 tablet (20 mg total) by mouth daily as needed for erectile dysfunction. Patient not taking: Reported on 03/27/2024 02/04/23   Joyce Norleen BROCKS, MD    PRN MEDs: acetaminophen  **OR** acetaminophen , hydrALAZINE , HYDROmorphone  (DILAUDID ) injection, ipratropium, ondansetron  **OR** ondansetron  (ZOFRAN ) IV, oxyCODONE , traZODone   Past Medical History:  Diagnosis Date   Atrial fibrillation (HCC)    Cancer [facility])    Pancreas   Clotting disorder July 2024   Lower GI Bleed   Coronary artery disease    Diabetes mellitus without complication (HCC)    Dyslipidemia    Dysrhythmia    A.Fib-on Eliquis    ED (erectile dysfunction)    Hyperlipidemia    Hyperparathyroidism    Hypertension    Hypogonadism, male    Melena 02/09/2024   Sleep apnea     Past Surgical History:  Procedure Laterality Date   ESOPHAGOGASTRODUODENOSCOPY N/A 02/10/2024   Procedure: EGD (ESOPHAGOGASTRODUODENOSCOPY);  Surgeon: [name], MD;  Location: [facility];  Service: Gastroenterology;  Laterality: N/A;   ESOPHAGOGASTRODUODENOSCOPY N/A 02/17/2024   Procedure: EGD (ESOPHAGOGASTRODUODENOSCOPY);   Surgeon: Burnette Fallow, MD;  Location: [facility];  Service: Gastroenterology;  Laterality: N/A;   EUS N/A 02/17/2024   Procedure: ULTRASOUND, UPPER GI TRACT, ENDOSCOPIC;  Surgeon: Burnette Fallow, MD;  Location: [facility];  Service: Gastroenterology;  Laterality: N/A;  fine needle aspiration   FINE NEEDLE ASPIRATION BIOPSY  02/17/2024   Procedure: FINE NEEDLE ASPIRATION BIOPSY;  Surgeon: Burnette Fallow, MD;  Location: [facility];  Service: Gastroenterology;;   PARATHYROIDECTOMY     PORTACATH PLACEMENT Right 03/07/2024   Procedure: INSERTION, RIGHT TUNNELED CENTRAL VENOUS DEVICE, WITH PORT;  Surgeon: [name] Leonor CROME, MD;  Location: [facility];  Service: General;  Laterality: Right;  PORT-A-CATH INSERTION WITH ULTRASOUND GUIDANCE   SKIN BIOPSY Left 03/02/2019   squamous cell carcinoma in situ, hypertrophic, traumatized   SKIN BIOPSY Right 08/01/2021   residual squamous cell caarcinoma     reports that he has  never smoked. He has never used smokeless tobacco. He reports current alcohol use of about 14.0 standard drinks of alcohol per week. He reports that he does not use drugs.   Family History  Problem Relation Age of Onset   Hypertension Father    Hyperlipidemia Father    Heart attack Father     Physical Exam:   Vitals:   03/27/24 0245 03/27/24 0300 03/27/24 0630 03/27/24 0825  BP: (!) 178/77 (!) 161/76  128/66  Pulse: 92 98 92 92  Resp: 17 (!) 21 (!) 30 16  Temp:   99.4 F (37.4 C)   TempSrc:      SpO2: 98% 97% 94% 96%   Constitutional: NAD, calm, comfortable Eyes: PERRL, lids and conjunctivae normal ENMT: Mucous membranes are moist. Posterior pharynx clear of any exudate or lesions.Normal dentition.  Neck: normal, supple, no masses, no thyromegaly Respiratory: clear to auscultation bilaterally, no wheezing, no crackles. Normal respiratory effort. No accessory muscle use.  Cardiovascular: Regular rate and rhythm, no murmurs / rubs / gallops. No extremity edema. 2+ pedal  pulses. No carotid bruits.  Abdomen: no tenderness, no masses palpated. No hepatosplenomegaly. Bowel sounds positive.  Musculoskeletal: no clubbing / cyanosis. No joint deformity upper and lower extremities. Good ROM, no contractures. Normal muscle tone.  Neurologic: CN II-XII grossly intact. Sensation intact, DTR normal. Strength 5/5 in all 4.  Psychiatric: Normal judgment and insight. Alert and oriented x 3. Normal mood.  Skin: no rashes, lesions, ulcers. No induration          Labs on admission:    I have personally reviewed following labs and imaging studies  CBC: Recent Labs  Lab 03/26/24 2111 03/27/24 0038 03/27/24 0436  WBC 1.5*  --  1.1*  NEUTROABS 0.6*  --   --   HGB 12.2* 11.9* 10.6*  HCT 38.3* 35.0* 33.5*  MCV 87.2  --  88.2  PLT 92*  --  76*   Basic Metabolic Panel: Recent Labs  Lab 03/26/24 2111 03/27/24 0038 03/27/24 0436  NA 138 139  --   K 2.7* 2.8*  --   CL 102 102  --   CO2 22  --   --   GLUCOSE 180* 153*  --   BUN 19 19  --   CREATININE 1.16 1.20 1.24  CALCIUM  8.7*  --   --   MG  --   --  1.5*  PHOS  --   --  3.0   GFR: Estimated Creatinine Clearance: 63.3 mL/min (by C-G formula based on SCr of 1.24 mg/dL). Liver Function Tests: Recent Labs  Lab 03/26/24 2111  AST 14*  ALT 9  ALKPHOS 60  BILITOT 0.6  PROT 6.6  ALBUMIN 3.8   Recent Labs  Lab 03/26/24 2111  LIPASE 29    Urine analysis:    Component Value Date/Time   COLORURINE YELLOW 03/27/2024 0241   APPEARANCEUR HAZY (A) 03/27/2024 0241   LABSPEC 1.029 03/27/2024 0241   PHURINE 5.0 03/27/2024 0241   GLUCOSEU >=500 (A) 03/27/2024 0241   HGBUR NEGATIVE 03/27/2024 0241   BILIRUBINUR NEGATIVE 03/27/2024 0241   BILIRUBINUR neg 03/08/2014 0934   KETONESUR 20 (A) 03/27/2024 0241   PROTEINUR 30 (A) 03/27/2024 0241   UROBILINOGEN negative 03/08/2014 0934   NITRITE NEGATIVE 03/27/2024 0241   LEUKOCYTESUR NEGATIVE 03/27/2024 0241    Last A1C:  Lab Results  Component Value  Date   HGBA1C 8.3 (H) 02/09/2024     Radiologic Exams on Admission:  DG Chest 2 View Result Date: 03/27/2024 EXAM: 2 VIEW(S) XRAY OF THE CHEST 03/27/2024 12:13:00 AM COMPARISON: 03/07/2024 CLINICAL HISTORY: Fever FINDINGS: LINES, TUBES AND DEVICES: Right chest Port-A-Cath in place with tip overlying the expected region of the mid superior vena cava. LUNGS AND PLEURA: No focal pulmonary opacity. Blunting of bilateral costophrenic angles. No pneumothorax. HEART AND MEDIASTINUM: No acute abnormality of the cardiac and mediastinal silhouettes. Aortic atherosclerosis. BONES AND SOFT TISSUES: No acute osseous abnormality. IMPRESSION: 1. Blunting of bilateral costophrenic angles. Possible trace pleural effusion versus scarring. 2. Right chest Port-A-Cath with tip overlying the mid superior vena cava. Electronically signed by: Morgane Naveau MD MD 03/27/2024 12:16 AM EST RP Workstation: [id number]    EKG:   Independently reviewed.  Orders placed or performed during the hospital encounter of 03/26/24   ED EKG   ED EKG   EKG 12-Lead   ---------------------------------------------------------------------------------------------------------------------------------------    Assessment / Plan:   Principal Problem:   Neutropenic fever Active Problems:   Type 2 diabetes mellitus with hyperglycemia (HCC)   Hypokalemia   Chronic diastolic CHF (congestive heart failure) (HCC)   Pancytopenia (HCC)   Pancreatic cancer (HCC)   Diarrhea   Essential hypertension   Hyperlipidemia   Atrial fibrillation (HCC)   AKI (acute kidney injury)   Hypogonadism male   History of hyperparathyroidism   Iron  deficiency anemia due to chronic blood loss   Assessment and Plan: * Neutropenic fever Tmax 102, WBC 1.5, 1.1 neutrophils 0.6, lymphocytes 0.3, -Lactic acid 0.7, -Currently under chemotherapy for ongoing pancreatic cancer - Presenting with dehydration, diarrhea -Follow-up blood cultures -Initiated  empiric antibiotics of cefepime , p.o. vancomycin   Diarrhea Persistent diarrhea, resulting in dehydration, hypokalemia - gentle IV fluid hydration - Replacing electrolytes such as potassium, magnesium , monitoring closely -Sending stool for C. Difficile Ruling out C. difficile colitis, continue empiric p.o. vancomycin   Pancreatic cancer (HCC) Last chemotherapy approximately 2 weeks ago -Stage IIa, -Continue follow-up with oncologist for treatment plan-[name]  Pancytopenia (HCC) WBC C1.1, hemoglobin 10.6, platelets 76, neutrophil of 0.6, lymphocytes 0.3, -Ackley on the chemotherapy for ongoing pancreatic cancer -Monitoring closely  Chronic diastolic CHF (congestive heart failure) (HCC) Monitoring closely, currently dehydrated, volume depleted Due to progressive diarrhea -Continue gentle IV fluid hydration, avoiding volume overload - Monitoring I's and O's, daily weight  Last echo 10/04/2023, EF 60-65%, normal LV function, small pericardial effusion,  Hypokalemia -Severe hypokalemia serum potassium 2.7, 2.8 -Estimated by ongoing diarrhea, dehydration -Checking serum magnesium  level -ED initiated IV, p.o. potassium supplements -Rechecking potassium levels later today, monitoring closely -Checking magnesium  level, replating  Type 2 diabetes mellitus with hyperglycemia (HCC) Controlled diabetes mellitus type 2 with last A1c : 8.3 -Intolerance to insulin  -Continue home medication of metformin , Farxiga   AKI (acute kidney injury) Mild AKI, monitoring BUN/creatinine closely -Gentle IV fluid hydration  Atrial fibrillation (HCC) Continue Eliquis , not on any rate control medications -Although platelets are running low, will monitor closely for bleeding  Hyperlipidemia Continue statins  Essential hypertension Blood pressure elevated, reviewing home medications -Continue: Amlodipine , - As needed IV hydralazine   Hypogonadism male Continue testosterone   supplement       Consults called:  None -------------------------------------------------------------------------------------------------------------------------------------------- DVT prophylaxis:  Place and maintain sequential compression device Start: 03/27/24 0736 Place TED hose Start: 03/27/24 0736 SCDs Start: 03/27/24 0732 SCDs Start: 03/27/24 0409 apixaban  (ELIQUIS ) tablet 5 mg   Code Status:   Code Status: Full Code   Admission status: Patient will be admitted as Inpatient, with a greater than 2 midnight length  of stay. Level of care: Telemetry   Family Communication:  none at bedside  (The above findings and plan of care has been discussed with patient in detail, the patient expressed understanding and agreement of above plan)  --------------------------------------------------------------------------------------------------------------------------------------------------  Disposition Plan:  Anticipated 1-2 days Status is: Inpatient Remains inpatient appropriate because: Severe electrolyte normalities, dehydration, ongoing diarrhea, neutropenic fever, hypokalemia---      ----------------------------------------------------------------------------------------------------------------------------------------------------  Time spent:  64  Min.  Was spent seeing and evaluating the patient, reviewing all medical records, drawn plan of care.  SIGNED: Adriana DELENA Grams, MD, FHM. FAAFP.  - [facility], Pager  (Please use [email or web address] to page/ or secure chat through epic) If 7PM-7AM, please contact night-coverage [email or web address],  03/27/2024, 9:12 AM     [1]  Allergies Allergen Reactions   Yellow Jacket Venom Anaphylaxis   "

## 2024-03-27 NOTE — Assessment & Plan Note (Addendum)
 Blood pressure elevated, reviewing home medications -Continue: Amlodipine , - As needed IV hydralazine 

## 2024-03-27 NOTE — ED Notes (Signed)
 WBC 1.1, EDP notified.

## 2024-03-27 NOTE — Assessment & Plan Note (Signed)
 Continue Eliquis , not on any rate control medications -Although platelets are running low, will monitor closely for bleeding

## 2024-03-27 NOTE — Assessment & Plan Note (Signed)
-  Severe hypokalemia serum potassium 2.7, 2.8 -Estimated by ongoing diarrhea, dehydration -Checking serum magnesium  level -ED initiated IV, p.o. potassium supplements -Rechecking potassium levels later today, monitoring closely -Checking magnesium  level, replating

## 2024-03-27 NOTE — Assessment & Plan Note (Signed)
 Mild AKI, monitoring BUN/creatinine closely -Gentle IV fluid hydration

## 2024-03-27 NOTE — Assessment & Plan Note (Signed)
 WBC C1.1, hemoglobin 10.6, platelets 76, neutrophil of 0.6, lymphocytes 0.3, -Ackley on the chemotherapy for ongoing pancreatic cancer -Monitoring closely

## 2024-03-27 NOTE — Assessment & Plan Note (Signed)
 Adenocarcinoma in tail of pancreas, cT3N0M0 -Patient was admitted in early December 2025 for GI bleeding, CT scan incidentally showed a 5.8 cm mass in the tail of pancreas, no evidence of nodal or distant metastasis.  CT scan showed possible invasion to gastric wall. -He underwent a EUS biopsy which confirmed adenocarcinoma.  Tumor invades splenic artery, no other vascular involvement. -Pt was seen by surgeon [name]. We recommend neoadjuvant chemo. He stared FOLFIRINOX on 03/14/2024.  He was admitted for neutropenic fever after first cycle chemo.

## 2024-03-27 NOTE — Assessment & Plan Note (Signed)
 Persistent diarrhea, resulting in dehydration, hypokalemia -Sinew gentle IV fluid hydration -Replating electrolytes such as potassium, magnesium , monitoring closely -Sending stool for C. difficile

## 2024-03-27 NOTE — Progress Notes (Signed)
" °   03/27/24 2237  BiPAP/CPAP/SIPAP  Reason BIPAP/CPAP not in use Other(comment) (PATIENT WANTS TO WAIT FOR HIS WIFE TO BRING HIS MASK OR WHOLE CPAP MACHINE FROM HOME)    "

## 2024-03-27 NOTE — Assessment & Plan Note (Signed)
 Tmax 102, WBC 1.5, 1.1 neutrophils 0.6, lymphocytes 0.3, -Lactic acid 0.7, -Currently under chemotherapy for ongoing pancreatic cancer -Senting with dehydration, diarrhea -Follow-up blood cultures -Initiated empiric antibiotics of cefepime , p.o. vancomycin 

## 2024-03-27 NOTE — Progress Notes (Signed)
 Patient continues to have high-volume watery diarrhea.  C. difficile negative.  Admitted for neutropenic fever in context of chemotherapy for pancreatic cancer.  Most recent potassium 3.0.  Have changed IV fluids to normal saline with 20 of potassium per liter at 125 cc/h and have started Florastor.  Routine daily labs have previously been ordered.

## 2024-03-27 NOTE — Assessment & Plan Note (Signed)
 Last chemotherapy approximately 2 weeks ago -Stage IIa, -Continue follow-up with oncologist for treatment plan

## 2024-03-28 ENCOUNTER — Inpatient Hospital Stay

## 2024-03-28 ENCOUNTER — Inpatient Hospital Stay: Admitting: Hematology

## 2024-03-28 DIAGNOSIS — D709 Neutropenia, unspecified: Secondary | ICD-10-CM | POA: Diagnosis not present

## 2024-03-28 DIAGNOSIS — R5081 Fever presenting with conditions classified elsewhere: Secondary | ICD-10-CM | POA: Diagnosis not present

## 2024-03-28 DIAGNOSIS — C252 Malignant neoplasm of tail of pancreas: Secondary | ICD-10-CM

## 2024-03-28 LAB — BASIC METABOLIC PANEL WITH GFR
Anion gap: 10 (ref 5–15)
BUN: 19 mg/dL (ref 8–23)
CO2: 22 mmol/L (ref 22–32)
Calcium: 8.4 mg/dL — ABNORMAL LOW (ref 8.9–10.3)
Chloride: 107 mmol/L (ref 98–111)
Creatinine, Ser: 1.08 mg/dL (ref 0.61–1.24)
GFR, Estimated: >60 mL/min
Glucose, Bld: 181 mg/dL — ABNORMAL HIGH (ref 70–99)
Potassium: 3.7 mmol/L (ref 3.5–5.1)
Sodium: 139 mmol/L (ref 135–145)

## 2024-03-28 LAB — CBC
HCT: 34.1 % — ABNORMAL LOW (ref 39.0–52.0)
Hemoglobin: 11.1 g/dL — ABNORMAL LOW (ref 13.0–17.0)
MCH: 28.1 pg (ref 26.0–34.0)
MCHC: 32.6 g/dL (ref 30.0–36.0)
MCV: 86.3 fL (ref 80.0–100.0)
Platelets: 92 K/uL — ABNORMAL LOW (ref 150–400)
RBC: 3.95 MIL/uL — ABNORMAL LOW (ref 4.22–5.81)
RDW: 12.8 % (ref 11.5–15.5)
WBC: 2.1 K/uL — ABNORMAL LOW (ref 4.0–10.5)
nRBC: 0 % (ref 0.0–0.2)

## 2024-03-28 LAB — GLUCOSE, CAPILLARY
Glucose-Capillary: 112 mg/dL — ABNORMAL HIGH (ref 70–99)
Glucose-Capillary: 158 mg/dL — ABNORMAL HIGH (ref 70–99)
Glucose-Capillary: 186 mg/dL — ABNORMAL HIGH (ref 70–99)
Glucose-Capillary: 224 mg/dL — ABNORMAL HIGH (ref 70–99)

## 2024-03-28 LAB — PHOSPHORUS: Phosphorus: 1.6 mg/dL — ABNORMAL LOW (ref 2.5–4.6)

## 2024-03-28 LAB — MAGNESIUM: Magnesium: 2.1 mg/dL (ref 1.7–2.4)

## 2024-03-28 MED ORDER — SODIUM PHOSPHATES 45 MMOLE/15ML IV SOLN
30.0000 mmol | Freq: Once | INTRAVENOUS | Status: AC
  Administered 2024-03-28: 30 mmol via INTRAVENOUS
  Filled 2024-03-28: qty 10

## 2024-03-28 MED ORDER — LOPERAMIDE HCL 2 MG PO CAPS
2.0000 mg | ORAL_CAPSULE | Freq: Four times a day (QID) | ORAL | Status: DC | PRN
  Administered 2024-03-28: 2 mg via ORAL
  Filled 2024-03-28: qty 1

## 2024-03-28 MED ORDER — SODIUM CHLORIDE 0.9 % IV SOLN: INTRAVENOUS | Status: DC

## 2024-03-28 MED ORDER — LOPERAMIDE HCL 2 MG PO CAPS
2.0000 mg | ORAL_CAPSULE | Freq: Four times a day (QID) | ORAL | Status: DC
  Administered 2024-03-28: 2 mg via ORAL
  Filled 2024-03-28: qty 1

## 2024-03-28 NOTE — Progress Notes (Signed)
 OT Cancellation Note  Patient Details Name: [name] [name] MRN: [id number] DOB: [birth year]   Cancelled Treatment:    Reason Eval/Treat Not Completed: OT screened, no needs identified, will sign off  Patient independently up in bathroom managing ADL's. No further OT needs. Thank you for this referral.  Geni OT/L Acute Rehabilitation Department  [phone]    03/28/2024, 11:14 AM

## 2024-03-28 NOTE — Plan of Care (Signed)
" °  Problem: Coping: Goal: Ability to adjust to condition or change in health will improve Outcome: Progressing   Problem: Fluid Volume: Goal: Ability to maintain a balanced intake and output will improve Outcome: Progressing   Problem: Metabolic: Goal: Ability to maintain appropriate glucose levels will improve Outcome: Progressing   Problem: Nutritional: Goal: Maintenance of adequate nutrition will improve Outcome: Progressing Goal: Progress toward achieving an optimal weight will improve Outcome: Progressing   Problem: Skin Integrity: Goal: Risk for impaired skin integrity will decrease Outcome: Progressing   Problem: Tissue Perfusion: Goal: Adequacy of tissue perfusion will improve Outcome: Progressing   Problem: Clinical Measurements: Goal: Will remain free from infection Outcome: Progressing Goal: Diagnostic test results will improve Outcome: Progressing   "

## 2024-03-28 NOTE — Progress Notes (Signed)
 PT Cancellation Note  Patient Details Name: [name] [name] MRN: [id number] DOB: July 10, 1950   Cancelled Treatment:    Reason Eval/Treat Not Completed: PT screened, no needs identified, will sign off Pt independent in room.  Has been ambulating, unplugging IV pole, brushing teeth, and going to bathroom.  Denies any weakness or balance issues.  No further PT indicated.  Pt did ask about ambulating in halls but is on enteric precautions - advised being in room until results return, then check with nursing if wants to ambulate in hall.  Benjiman, PT Acute Rehab [facility] [phone]   Benjiman VEAR [place] 03/28/2024, 11:12 AM

## 2024-03-28 NOTE — TOC Progression Note (Signed)
 Transition of Care [facility]) - Progression Note    Patient Details  Name: [name] [name] MRN: [id number] Date of Birth: 16-Apr-1950  Transition of Care [facility]) CM/SW Contact  Toy LITTIE Agar, RN Phone Number:[phone]  03/28/2024, 8:40 AM  Clinical Narrative:    Inpatient care manager acknowledges consult for Home health /DME needs. Currently there are no recommendations. IPCM following for therapy recommendations.                      Expected Discharge Plan and Services                                               Social Drivers of Health (SDOH) Interventions SDOH Screenings   Food Insecurity: No Food Insecurity (03/27/2024)  Housing: Low Risk (03/27/2024)  Transportation Needs: No Transportation Needs (03/27/2024)  Utilities: Not At Risk (03/27/2024)  Alcohol Screen: Low Risk (02/23/2024)  Depression (PHQ2-9): Low Risk (03/14/2024)  Financial Resource Strain: Low Risk (02/23/2024)  Physical Activity: Sufficiently Active (02/23/2024)  Social Connections: Patient Declined (03/27/2024)  Stress: No Stress Concern Present (02/23/2024)  Tobacco Use: Low Risk (03/27/2024)  Health Literacy: Adequate Health Literacy (02/23/2024)    Readmission Risk Interventions     No data to display

## 2024-03-28 NOTE — Progress Notes (Signed)
" °   03/28/24 2333  BiPAP/CPAP/SIPAP  $ Non-Invasive Home Ventilator  Initial  BiPAP/CPAP/SIPAP Pt Type Adult  BiPAP/CPAP/SIPAP DREAMSTATIOND  Mask Type Nasal pillows (home mask)  Dentures removed? Not applicable  Respiratory Rate 20 breaths/min  FiO2 (%) 21 %  Patient Home Machine No  Patient Home Mask Yes  Patient Home Tubing No  Auto Titrate Yes  Minimum cmH2O 4 cmH2O  Maximum cmH2O 20 cmH2O  CPAP/SIPAP surface wiped down Yes  Device Plugged into RED Power Outlet Yes  BiPAP/CPAP /SiPAP Vitals  Resp 20  MEWS Score/Color  MEWS Score 0  MEWS Score Color Green    "

## 2024-03-28 NOTE — Progress Notes (Signed)
 [name] [name]   DOB:Jun 28, 1950   FM#:[id number]   RDW#:[id number]  Heme-onc follow-up note  Subjective: She is well-known to me, under my care for his pancreatic cancer.  He is on neoadjuvant chemotherapy FOLFIRINOX.  Received first cycle chemo on March 14, 2024.  He developed diarrhea 10 days after his chemo, no significant abdominal pain, nausea, vomiting.  He came to the hospital 2 days ago due to new onset fever, and was admitted for neutropenic fever.  His diarrhea has improved today.   Objective:  Vitals:   03/28/24 0452 03/28/24 1436  BP: 136/73 (!) 157/71  Pulse: 69 70  Resp: 14 18  Temp: (!) 97.5 F (36.4 C) 98.4 F (36.9 C)  SpO2: 98% 98%    Body mass index is 30.47 kg/m.  Intake/Output Summary (Last 24 hours) at 03/28/2024 1714 Last data filed at 03/28/2024 1537 Gross per 24 hour  Intake 2048.39 ml  Output --  Net 2048.39 ml     Sclerae unicteric  Oropharynx clear  No peripheral adenopathy  Lungs clear -- no rales or rhonchi  Heart regular rate and rhythm  Abdomen benign  MSK no focal spinal tenderness, no peripheral edema  Neuro nonfocal    CBG (last 3)  Recent Labs    03/28/24 0737 03/28/24 1209 03/28/24 1711  GLUCAP 112* 158* 186*     Labs:  Lab Results  Component Value Date   WBC 2.1 (L) 03/28/2024   HGB 11.1 (L) 03/28/2024   HCT 34.1 (L) 03/28/2024   MCV 86.3 03/28/2024   PLT 92 (L) 03/28/2024   NEUTROABS 0.6 (L) 03/26/2024     Urine Studies No results for input(s): UHGB, CRYS in the last 72 hours.  Invalid input(s): UACOL, UAPR, USPG, UPH, UTP, UGL, [place], UBIL, UNIT, UROB, [place], UEPI, UWBC, CORINN JERROL BURNS [place], [place]  Basic Metabolic Panel: Recent Labs  Lab 03/26/24 2111 03/27/24 0038 03/27/24 0436 03/27/24 1648 03/28/24 1158  NA 138 139  --  136 139  K 2.7* 2.8*  --  3.0* 3.7  CL 102 102  --  106 107  CO2 22  --   --  17* 22  GLUCOSE 180* 153*  --  139* 181*  BUN 19 19  --  20 19   CREATININE 1.16 1.20 1.24 1.37* 1.08  CALCIUM  8.7*  --   --  7.9* 8.4*  MG  --   --  1.5*  --  2.1  PHOS  --   --  3.0  --  1.6*   GFR Estimated Creatinine Clearance: 73.1 mL/min (by C-G formula based on SCr of 1.08 mg/dL). Liver Function Tests: Recent Labs  Lab 03/26/24 2111  AST 14*  ALT 9  ALKPHOS 60  BILITOT 0.6  PROT 6.6  ALBUMIN 3.8   Recent Labs  Lab 03/26/24 2111  LIPASE 29   No results for input(s): AMMONIA in the last 168 hours. Coagulation profile No results for input(s): INR, PROTIME in the last 168 hours.  CBC: Recent Labs  Lab 03/26/24 2111 03/27/24 0038 03/27/24 0436 03/28/24 1158  WBC 1.5*  --  1.1* 2.1*  NEUTROABS 0.6*  --   --   --   HGB 12.2* 11.9* 10.6* 11.1*  HCT 38.3* 35.0* 33.5* 34.1*  MCV 87.2  --  88.2 86.3  PLT 92*  --  76* 92*   Cardiac Enzymes: No results for input(s): CKTOTAL, CKMB, CKMBINDEX, TROPONINI in the last 168 hours. BNP: Invalid input(s): POCBNP CBG: Recent Labs  Lab 03/27/24 1703 03/27/24 2051 03/28/24 0737 03/28/24 1209 03/28/24 1711  GLUCAP 125* 163* 112* 158* 186*   D-Dimer No results for input(s): DDIMER in the last 72 hours. Hgb A1c Recent Labs    03/27/24 0436  HGBA1C 7.4*   Lipid Profile No results for input(s): CHOL, HDL, LDLCALC, TRIG, CHOLHDL, LDLDIRECT in the last 72 hours. Thyroid function studies No results for input(s): TSH, T4TOTAL, T3FREE, THYROIDAB in the last 72 hours.  Invalid input(s): FREET3 Anemia work up No results for input(s): VITAMINB12, FOLATE, FERRITIN, TIBC, IRON , RETICCTPCT in the last 72 hours. Microbiology Recent Results (from the past 240 hours)  Blood culture (routine x 2)     Status: None (Preliminary result)   Collection Time: 03/26/24  9:11 PM   Specimen: Right Antecubital; Blood  Result Value Ref Range Status   Specimen Description   Final    RIGHT ANTECUBITAL Performed at [facility], [address], [place], [place] [address]    Special Requests   Final    BOTTLES DRAWN AEROBIC AND ANAEROBIC Blood Culture adequate volume Performed at [facility], [address], [place], [place] [address]    Culture   Final    NO GROWTH 1 DAY Performed at [facility], [address] [place], [place] [address]    Report Status PENDING  Incomplete  Resp panel by RT-PCR (RSV, Flu A&B, Covid) Anterior Nasal Swab     Status: None   Collection Time: 03/27/24 12:43 AM   Specimen: Anterior Nasal Swab  Result Value Ref Range Status   SARS Coronavirus 2 by RT PCR NEGATIVE NEGATIVE Final    Comment: (NOTE) SARS-CoV-2 target nucleic acids are NOT DETECTED.  The SARS-CoV-2 RNA is generally detectable in upper respiratory specimens during the acute phase of infection. The lowest concentration of SARS-CoV-2 viral copies this assay can detect is 138 copies/mL. A negative result does not preclude SARS-Cov-2 infection and should not be used as the sole basis for treatment or other patient management decisions. A negative result may occur with  improper specimen collection/handling, submission of specimen other than nasopharyngeal swab, presence of viral mutation(s) within the areas targeted by this assay, and inadequate number of viral copies(<138 copies/mL). A negative result must be combined with clinical observations, patient history, and epidemiological information. The expected result is Negative.  Fact Sheet for Patients:  bloggercourse.com  Fact Sheet for Healthcare Providers:  seriousbroker.it  This test is no t yet approved or cleared by the [place]  FDA and  has been authorized for detection and/or diagnosis of SARS-CoV-2 by FDA under an Emergency Use Authorization (EUA). This EUA will remain  in effect (meaning this test can be used) for the duration of the COVID-19 declaration under Section 564(b)(1)  of the Act, 21 U.S.C.section 360bbb-3(b)(1), unless the authorization is terminated  or revoked sooner.       Influenza A by PCR NEGATIVE NEGATIVE Final   Influenza B by PCR NEGATIVE NEGATIVE Final    Comment: (NOTE) The Xpert Xpress SARS-CoV-2/FLU/RSV plus assay is intended as an aid in the diagnosis of influenza from Nasopharyngeal swab specimens and should not be used as a sole basis for treatment. Nasal washings and aspirates are unacceptable for Xpert Xpress SARS-CoV-2/FLU/RSV testing.  Fact Sheet for Patients: bloggercourse.com  Fact Sheet for Healthcare Providers: seriousbroker.it  This test is not yet approved or cleared by the [place]  FDA and has been authorized for detection and/or diagnosis of SARS-CoV-2 by  FDA under an Emergency Use Authorization (EUA). This EUA will remain in effect (meaning this test can be used) for the duration of the COVID-19 declaration under Section 564(b)(1) of the Act, 21 U.S.C. section 360bbb-3(b)(1), unless the authorization is terminated or revoked.     Resp Syncytial Virus by PCR NEGATIVE NEGATIVE Final    Comment: (NOTE) Fact Sheet for Patients: bloggercourse.com  Fact Sheet for Healthcare Providers: seriousbroker.it  This test is not yet approved or cleared by the [place]  FDA and has been authorized for detection and/or diagnosis of SARS-CoV-2 by FDA under an Emergency Use Authorization (EUA). This EUA will remain in effect (meaning this test can be used) for the duration of the COVID-19 declaration under Section 564(b)(1) of the Act, 21 U.S.C. section 360bbb-3(b)(1), unless the authorization is terminated or revoked.  Performed at [facility], [address], [place], [place] [address]   Blood culture (routine x 2)     Status: None (Preliminary result)   Collection Time: 03/27/24  2:58 AM    Specimen: Left Antecubital; Blood  Result Value Ref Range Status   Specimen Description   Final    LEFT ANTECUBITAL Performed at [facility], [address], [place], [place] [address]    Special Requests   Final    BOTTLES DRAWN AEROBIC AND ANAEROBIC Blood Culture results may not be optimal due to an inadequate volume of blood received in culture bottles Performed at [facility], [address], [place], [place] [address]    Culture   Final    NO GROWTH 1 DAY Performed at [facility], [address] [place], [place] [address]    Report Status PENDING  Incomplete  C Difficile Quick Screen w PCR reflex     Status: None   Collection Time: 03/27/24  5:25 AM   Specimen: STOOL  Result Value Ref Range Status   C Diff antigen NEGATIVE NEGATIVE Final   C Diff toxin NEGATIVE NEGATIVE Final   C Diff interpretation No C. difficile detected.  Final    Comment: Performed at [facility], [address] [place], [place] [address]      Studies:  DG Chest 2 View Result Date: 03/27/2024 EXAM: 2 VIEW(S) XRAY OF THE CHEST 03/27/2024 12:13:00 AM COMPARISON: 03/07/2024 CLINICAL HISTORY: Fever FINDINGS: LINES, TUBES AND DEVICES: Right chest Port-A-Cath in place with tip overlying the expected region of the mid superior vena cava. LUNGS AND PLEURA: No focal pulmonary opacity. Blunting of bilateral costophrenic angles. No pneumothorax. HEART AND MEDIASTINUM: No acute abnormality of the cardiac and mediastinal silhouettes. Aortic atherosclerosis. BONES AND SOFT TISSUES: No acute osseous abnormality. IMPRESSION: 1. Blunting of bilateral costophrenic angles. Possible trace pleural effusion versus scarring. 2. Right chest Port-A-Cath with tip overlying the mid superior vena cava. Electronically signed by: Morgane Naveau MD MD 03/27/2024 12:16 AM EST RP Workstation: [id number]    Assessment: 74 y.o. male   Neutropenic fever Severe diarrhea due  to chemotherapy, and possible metformin  or virus infection Electrolytes imbalance due to diarrhea Stage IIa pancreatic cancer, on neoadjuvant chemotherapy Anemia and thrombocytopenia CHF AF DM Obesity     Plan:  -c-diff negative, GI panel still pending, he is on broad antibiotics. - Agree with Imodium  for diarrhea, and Lomotil if needed. - Will likely reduce irinotecan  dose on next cycle. - Electrolytes replacement per primary team -He was scheduled to have second cycle chemo today in the office, which we canceled. - Spoke  with patient, his wife, and Dr. Arlice today  -I will f/u as needed    Onita Mattock, MD 03/28/2024  5:14 PM

## 2024-03-28 NOTE — Plan of Care (Signed)
  Problem: Education: Goal: Ability to describe self-care measures that may prevent or decrease complications (Diabetes Survival Skills Education) will improve Outcome: Progressing Goal: Individualized Educational Video(s) Outcome: Progressing   Problem: Coping: Goal: Ability to adjust to condition or change in health will improve Outcome: Progressing   Problem: Fluid Volume: Goal: Ability to maintain a balanced intake and output will improve Outcome: Progressing   Problem: Health Behavior/Discharge Planning: Goal: Ability to identify and utilize available resources and services will improve Outcome: Progressing Goal: Ability to manage health-related needs will improve Outcome: Progressing   Problem: Metabolic: Goal: Ability to maintain appropriate glucose levels will improve Outcome: Progressing   Problem: Nutritional: Goal: Maintenance of adequate nutrition will improve Outcome: Progressing Goal: Progress toward achieving an optimal weight will improve Outcome: Progressing   Problem: Skin Integrity: Goal: Risk for impaired skin integrity will decrease Outcome: Progressing   Problem: Tissue Perfusion: Goal: Adequacy of tissue perfusion will improve Outcome: Progressing   Problem: Education: Goal: Knowledge of General Education information will improve Description: Including pain rating scale, medication(s)/side effects and non-pharmacologic comfort measures Outcome: Progressing   Problem: Health Behavior/Discharge Planning: Goal: Ability to manage health-related needs will improve Outcome: Progressing   Problem: Clinical Measurements: Goal: Ability to maintain clinical measurements within normal limits will improve Outcome: Progressing Goal: Will remain free from infection Outcome: Progressing Goal: Diagnostic test results will improve Outcome: Progressing Goal: Respiratory complications will improve Outcome: Progressing Goal: Cardiovascular complication will  be avoided Outcome: Progressing   Problem: Activity: Goal: Risk for activity intolerance will decrease Outcome: Progressing   Problem: Nutrition: Goal: Adequate nutrition will be maintained Outcome: Progressing   Problem: Coping: Goal: Level of anxiety will decrease Outcome: Progressing   Problem: Elimination: Goal: Will not experience complications related to urinary retention Outcome: Progressing   Problem: Pain Managment: Goal: General experience of comfort will improve and/or be controlled Outcome: Progressing   Problem: Safety: Goal: Ability to remain free from injury will improve Outcome: Progressing   Problem: Skin Integrity: Goal: Risk for impaired skin integrity will decrease Outcome: Progressing

## 2024-03-28 NOTE — Progress Notes (Signed)
 " PROGRESS NOTE  [name] [name]  DOB: 08-27-1950  PCP: [name], NP FMW:[id number]  DOA: 03/26/2024  LOS: 1 day  Hospital Day: 3  Subjective: Patient was seen and examined this morning.   Pleasant elderly Caucasian male.  Sitting up in recliner.  Not in distress.  Wife at bedside.  Oncology [name] was also at bedside. Patient continues to have diarrhea.  No abdominal pain or tenderness though. Tmax 101.8 yesterday morning, No fever since then.  Hemodynamically stable, breathing room air Most recent labs from yesterday afternoon with potassium low at 3, BUN/creatinine 20/1.37  Brief narrative: [name] [name] is a 74 y.o. male with PMH significant for pancreatic cancer on chemotherapy, obesity, OSA, DM2, HTN, HLD, A-fib, CHF, CAD, h/o GI bleeding. Patient received the first cycle of chemotherapy 2 weeks ago on 03/14/2024 1/10, patient presented to the ED with a fever of 102.1 at home, diarrhea for 2 days.  In the ED, patient had a temperature of 102.2, hemodynamically stable, breathing on room air Labs with WBC count 3.9, glucose level 250 otherwise unremarkable CMP, lactic acid normal Hepatitis panel nonreactive respiratory viral panel unremarkable Urinalysis unremarkable Chest x-ray unremarkable Blood culture sent Patient was started on broad-spectrum IV antibiotics.   Assessment and plan: Neutropenic fever Severe diarrhea Presented with fever, diarrhea in the setting of chemotherapy for pancreatic cancer  Absolute neutrophil count at 600 Blood cultures sent Started on empiric antibiotics and probiotics Patient continues to have high-volume diarrhea.  But C. difficile assay negative. I would start on Imodium  2 mg every 6 hours PRN On exam, abdomen is nontender.  If he starts to have tenderness or diarrhea worsens, may need CT scan of abdomen. Recent Labs  Lab 03/26/24 2111 03/27/24 0037 03/27/24 0436 03/28/24 1158  WBC 1.5*  --  1.1* 2.1*  LATICACIDVEN  --  0.7   --   --    AKI Hypokalemia Hypomagnesemia Hypophosphatemia Elevated creatinine, low electrolytes due to severe diarrhea Continue IV fluid, continue electrolyte replacements. Repeat labs this morning showed improvement in creatinine, potassium and magnesium .  Phosphorus level low.  Replacement ordered. Recent Labs  Lab 03/26/24 2111 03/27/24 0038 03/27/24 0436 03/27/24 1648 03/28/24 1158  NA 138 139  --  136 139  K 2.7* 2.8*  --  3.0* 3.7  CL 102 102  --  106 107  CO2 22  --   --  17* 22  GLUCOSE 180* 153*  --  139* 181*  BUN 19 19  --  20 19  CREATININE 1.16 1.20 1.24 1.37* 1.08  CALCIUM  8.7*  --   --  7.9* 8.4*  MG  --   --  1.5*  --  2.1  PHOS  --   --  3.0  --  1.6*    Stage IIa pancreatic cancer  Last chemotherapy approximately 2 weeks ago on 03/14/2024 Continue follow-up with oncologist for treatment plan-[name] Pain regimen --- Scheduled:  --- PRN: IV Dilaudid , oral oxycodone , Tylenol   Chemo-induced pancytopenia Chronic anemia Thrombocytopenia All cell lines are depressed. Seen by oncologist [name]. Continue iron  supplement.  Monitor hemoglobin and platelet count Recent Labs  Lab 03/26/24 2111 03/27/24 0038 03/27/24 0436 03/28/24 1158  WBC 1.5*  --  1.1* 2.1*  NEUTROABS 0.6*  --   --   --   HGB 12.2* 11.9* 10.6* 11.1*  HCT 38.3* 35.0* 33.5* 34.1*  MCV 87.2  --  88.2 86.3  PLT 92*  --  76* 92*  Chronic diastolic CHF HTN Currently dehydrated due to high-volume diarrhea Last echo from July 2025 with EF 60 to 65% PTA meds- amlodipine , Keep been on hold.  IV hydralazine  as needed  A-fib Not on any rate control medications Chronically anticoagulated with Eliquis .  If he starts to bleed or if platelet count starts to drop, may need to hold it.  Type 2 diabetes mellitus Diabetic neuropathy A1c 7.4 on 03/27/24 PTA meds-metformin , Farxiga .  Patient reports metformin  was recently added.  For now, I would keep both on hold for now.  Going forward I do  not think he needs metformin .  Can probably continue on Farxiga  alone. Currently on SSI/Accu-Cheks Continue gabapentin  Recent Labs  Lab 03/27/24 1205 03/27/24 1703 03/27/24 2051 03/28/24 0737 03/28/24 1209  GLUCAP 131* 125* 163* 112* 158*   CAD/HLD Continue Eliquis  and statin   Hypogonadism male Continue testosterone  supplement  Obesity 1 Body mass index is 30.47 kg/m. Patient has been advised to make an attempt to improve diet and exercise patterns to aid in weight loss.  OSA CPAP nightly    Nutrition Status:         Mobility: Able to ambulate independent  PT Orders: Active   PT Follow up Rec:     Goals of care   Code Status: Full Code     DVT prophylaxis:  Place and maintain sequential compression device Start: 03/27/24 0736 Place TED hose Start: 03/27/24 0736 SCDs Start: 03/27/24 0732 SCDs Start: 03/27/24 0409 apixaban  (ELIQUIS ) tablet 5 mg   Antimicrobials: IV cefepime , IV vancomycin  Fluid: NS at 125 mL/h Consultants: Oncology Family Communication: Wife at bedside  Status: Inpatient Level of care:  Telemetry   Patient is from: Home Needs to continue in-hospital care: Ongoing diarrhea.  Needs to continue inpatient care Anticipated d/c to: Hopefully home in 1 to 2 days   Diet:  Diet Order             Diet regular Room service appropriate? Yes; Fluid consistency: Thin  Diet effective now                   Scheduled Meds:  apixaban   5 mg Oral BID   feeding supplement  237 mL Oral BID BM   ferrous sulfate   325 mg Oral Q breakfast   gabapentin   300 mg Oral Q24H   rosuvastatin   40 mg Oral QHS   saccharomyces boulardii  250 mg Oral BID   sodium chloride  flush  3 mL Intravenous Q12H   sodium chloride  flush  3 mL Intravenous Q12H   vancomycin   125 mg Oral Q6H    PRN meds: acetaminophen  **OR** acetaminophen , hydrALAZINE , HYDROmorphone  (DILAUDID ) injection, ipratropium, loperamide , ondansetron  **OR** ondansetron  (ZOFRAN ) IV, oxyCODONE ,  traZODone    Infusions:   sodium chloride      ceFEPime  (MAXIPIME ) IV 2 g (03/28/24 1051)   sodium phosphate  30 mmol in sodium chloride  0.9 % 250 mL infusion      Antimicrobials: Anti-infectives (From admission, onward)    Start     Dose/Rate Route Frequency Ordered Stop   03/27/24 1100  ceFEPIme  (MAXIPIME ) 2 g in sodium chloride  0.9 % 100 mL IVPB        2 g 200 mL/hr over 30 Minutes Intravenous Every 8 hours 03/27/24 0734     03/27/24 0815  vancomycin  (VANCOCIN ) 50 mg/mL oral solution SOLN 125 mg        125 mg Oral Every 6 hours 03/27/24 0731     03/27/24 0300  ceFEPIme  (  MAXIPIME ) 2 g in sodium chloride  0.9 % 100 mL IVPB        2 g 200 mL/hr over 30 Minutes Intravenous  Once 03/27/24 0250 03/27/24 0331       Objective: Vitals:   03/28/24 0026 03/28/24 0452  BP: (!) 141/70 136/73  Pulse: 75 69  Resp: 15 14  Temp: 98.2 F (36.8 C) (!) 97.5 F (36.4 C)  SpO2: 97% 98%    Intake/Output Summary (Last 24 hours) at 03/28/2024 1317 Last data filed at 03/27/2024 1821 Gross per 24 hour  Intake 907.36 ml  Output --  Net 907.36 ml   Filed Weights   03/28/24 0500  Weight: 99.1 kg   Weight change:  Body mass index is 30.47 kg/m.   Physical Exam: General exam: Pleasant, elderly Caucasian male.  Not in distress Skin: No rashes, lesions or ulcers. HEENT: Atraumatic, normocephalic, no obvious bleeding Lungs: Clear to auscultation bilaterally,  CVS: S1, S2, no murmur,   GI/Abd: Soft, nontender, nondistended, bowel sound present,   CNS: Alert, awake, oriented x 3 Psychiatry: Mood appropriate Extremities: No pedal edema, no calf tenderness,   Data Review: I have personally reviewed the laboratory data and studies available.  F/u labs ordered Unresulted Labs (From admission, onward)     Start     Ordered   03/29/24 0500  CBC with Differential/Platelet  Tomorrow morning,   R        03/28/24 0851   03/29/24 0500  Basic metabolic panel with GFR  Tomorrow morning,   R         03/28/24 0851   03/28/24 1038  Gastrointestinal Panel by PCR , Stool  (Gastrointestinal Panel by PCR, Stool                                                                                                                                                     **Does Not include CLOSTRIDIUM DIFFICILE testing. **If CDIFF testing is needed, place order from the C Difficile Testing order set.**)  Once,   R        03/28/24 1037   03/28/24 0500  CBC  Daily,   R      03/27/24 0733   03/28/24 0500  Basic metabolic panel  Daily,   R      03/27/24 0733   03/27/24 0732  Expectorated Sputum Assessment w Gram Stain, Rflx to Resp Cult  Once,   R       Comments: If productive cough    03/27/24 9266            Signed, Chapman Rota, MD [facility] 03/28/2024  "

## 2024-03-29 DIAGNOSIS — R5081 Fever presenting with conditions classified elsewhere: Secondary | ICD-10-CM | POA: Diagnosis not present

## 2024-03-29 DIAGNOSIS — D709 Neutropenia, unspecified: Secondary | ICD-10-CM | POA: Diagnosis not present

## 2024-03-29 LAB — BASIC METABOLIC PANEL WITH GFR
Anion gap: 9 (ref 5–15)
BUN: 20 mg/dL (ref 8–23)
CO2: 22 mmol/L (ref 22–32)
Calcium: 8.4 mg/dL — ABNORMAL LOW (ref 8.9–10.3)
Chloride: 108 mmol/L (ref 98–111)
Creatinine, Ser: 0.9 mg/dL (ref 0.61–1.24)
GFR, Estimated: >60 mL/min
Glucose, Bld: 210 mg/dL — ABNORMAL HIGH (ref 70–99)
Potassium: 3.3 mmol/L — ABNORMAL LOW (ref 3.5–5.1)
Sodium: 138 mmol/L (ref 135–145)

## 2024-03-29 LAB — CBC WITH DIFFERENTIAL/PLATELET
Abs Immature Granulocytes: 0.08 K/uL — ABNORMAL HIGH (ref 0.00–0.07)
Basophils Absolute: 0 K/uL (ref 0.0–0.1)
Basophils Relative: 2 %
Eosinophils Absolute: 0.1 K/uL (ref 0.0–0.5)
Eosinophils Relative: 3 %
HCT: 31.7 % — ABNORMAL LOW (ref 39.0–52.0)
Hemoglobin: 10.2 g/dL — ABNORMAL LOW (ref 13.0–17.0)
Immature Granulocytes: 4 %
Lymphocytes Relative: 23 %
Lymphs Abs: 0.4 K/uL — ABNORMAL LOW (ref 0.7–4.0)
MCH: 27.7 pg (ref 26.0–34.0)
MCHC: 32.2 g/dL (ref 30.0–36.0)
MCV: 86.1 fL (ref 80.0–100.0)
Monocytes Absolute: 0.4 K/uL (ref 0.1–1.0)
Monocytes Relative: 23 %
Neutro Abs: 0.8 K/uL — ABNORMAL LOW (ref 1.7–7.7)
Neutrophils Relative %: 45 %
Platelets: 96 K/uL — ABNORMAL LOW (ref 150–400)
RBC: 3.68 MIL/uL — ABNORMAL LOW (ref 4.22–5.81)
RDW: 13 % (ref 11.5–15.5)
WBC: 1.8 K/uL — ABNORMAL LOW (ref 4.0–10.5)
nRBC: 0 % (ref 0.0–0.2)

## 2024-03-29 LAB — GLUCOSE, CAPILLARY
Glucose-Capillary: 183 mg/dL — ABNORMAL HIGH (ref 70–99)
Glucose-Capillary: 187 mg/dL — ABNORMAL HIGH (ref 70–99)
Glucose-Capillary: 175 mg/dL — ABNORMAL HIGH (ref 70–99)
Glucose-Capillary: 169 mg/dL — ABNORMAL HIGH (ref 70–99)

## 2024-03-29 MED ORDER — DAPAGLIFLOZIN PROPANEDIOL 10 MG PO TABS
10.0000 mg | ORAL_TABLET | Freq: Every day | ORAL | Status: DC
  Administered 2024-03-29 – 2024-03-30 (×2): 10 mg via ORAL
  Filled 2024-03-29 (×2): qty 1

## 2024-03-29 MED ORDER — POTASSIUM CHLORIDE CRYS ER 20 MEQ PO TBCR
40.0000 meq | EXTENDED_RELEASE_TABLET | Freq: Once | ORAL | Status: AC
  Administered 2024-03-29: 40 meq via ORAL
  Filled 2024-03-29: qty 2

## 2024-03-29 NOTE — Progress Notes (Signed)
" °   03/29/24 1620  TOC Brief Assessment  Insurance and Status Reviewed  Patient has primary care physician Yes  Home environment has been reviewed from home  Prior level of function: Independent  Prior/Current Home Services No current home services  Social Drivers of Health Review SDOH reviewed no interventions necessary  Readmission risk has been reviewed Yes  Transition of care needs no transition of care needs at this time   High risk patient. Will follow with full assessment "

## 2024-03-29 NOTE — Progress Notes (Signed)
 " PROGRESS NOTE  [name] [name]  DOB: Sep 21, 1950  PCP: [name], NP FMW:[id number]  DOA: 03/26/2024  LOS: 2 days  Hospital Day: 4  Subjective: Patient was seen and examined this morning. Sitting up in recliner.  Not in distress.  Diarrhea seems to be improving. Wife at bedside. Afebrile, hemodynamically stable. Blood sugar level running elevated close to 200  Brief narrative: [name] [name] is a 74 y.o. male with PMH significant for pancreatic cancer on chemotherapy, obesity, OSA, DM2, HTN, HLD, A-fib, CHF, CAD, h/o GI bleeding. Patient received the first cycle of chemotherapy 2 weeks ago on 03/14/2024 1/10, patient presented to the ED with a fever of 102.1 at home, diarrhea for 2 days.  In the ED, patient had a temperature of 102.2, hemodynamically stable, breathing on room air Labs with WBC count 3.9, glucose level 250 otherwise unremarkable CMP, lactic acid normal Hepatitis panel nonreactive respiratory viral panel unremarkable Urinalysis unremarkable Chest x-ray unremarkable Blood culture sent Patient was started on broad-spectrum IV antibiotics.   Assessment and plan: Neutropenic fever Severe diarrhea Presented with fever, diarrhea in the setting of chemotherapy for pancreatic cancer  Absolute neutrophil count at 600. GI panel pending.  C. difficile assay negative. Blood cultures sent on admission has not shown any growth so far. Currently on empiric antibiotics and probiotics Currently on Imodium  2 mg every 6 hours PRN Diarrhea severity and frequency seems to be improving.  No abdominal tenderness.  No fever. Continue as needed Imodium  Recent Labs  Lab 03/26/24 2111 03/27/24 0037 03/27/24 0436 03/28/24 1158  WBC 1.5*  --  1.1* 2.1*  LATICACIDVEN  --  0.7  --   --    AKI Hypokalemia Hypomagnesemia Hypophosphatemia On admission, had elevated creatinine, low electrolytes due to severe diarrhea Currently on IV fluid, continue electrolyte  replacements. Repeat labs ordered for today. Recent Labs  Lab 03/26/24 2111 03/27/24 0038 03/27/24 0436 03/27/24 1648 03/28/24 1158  NA 138 139  --  136 139  K 2.7* 2.8*  --  3.0* 3.7  CL 102 102  --  106 107  CO2 22  --   --  17* 22  GLUCOSE 180* 153*  --  139* 181*  BUN 19 19  --  20 19  CREATININE 1.16 1.20 1.24 1.37* 1.08  CALCIUM  8.7*  --   --  7.9* 8.4*  MG  --   --  1.5*  --  2.1  PHOS  --   --  3.0  --  1.6*    Stage IIa pancreatic cancer  Last chemotherapy approximately 2 weeks ago on 03/14/2024 Continue follow-up with oncologist for treatment plan-[name] Pain regimen --- Scheduled:  --- PRN: IV Dilaudid , oral oxycodone , Tylenol   Chemo-induced pancytopenia Chronic anemia Thrombocytopenia All cell lines are depressed. Seen by oncologist [name]. Continue iron  supplement.  Monitor hemoglobin and platelet count Recent Labs  Lab 03/26/24 2111 03/27/24 0038 03/27/24 0436 03/28/24 1158  WBC 1.5*  --  1.1* 2.1*  NEUTROABS 0.6*  --   --   --   HGB 12.2* 11.9* 10.6* 11.1*  HCT 38.3* 35.0* 33.5* 34.1*  MCV 87.2  --  88.2 86.3  PLT 92*  --  76* 92*   Chronic diastolic CHF HTN Currently dehydrated due to high-volume diarrhea Last echo from July 2025 with EF 60 to 65% PTA meds- amlodipine , currently on hold. Continue to monitor.  Continue IV hydralazine  as needed  A-fib Not on any rate control medications Chronically  anticoagulated with Eliquis .  If he starts to bleed or if platelet count starts to drop, may need to hold it.  Type 2 diabetes mellitus Diabetic neuropathy A1c 7.4 on 03/27/24 PTA meds-metformin , Farxiga .  Currently on SSI/Accu-Cheks.  Blood sugar level elevated.  Resume Farxiga  today. Patient reports metformin  was recently added. Going forward I do not think he needs metformin .  Continue gabapentin  Recent Labs  Lab 03/28/24 1209 03/28/24 1711 03/28/24 2144 03/29/24 0749 03/29/24 1143  GLUCAP 158* 186* 224* 183* 187*    CAD/HLD Continue Eliquis  and statin   Hypogonadism male Continue testosterone  supplement  Obesity 1 Body mass index is 30.47 kg/m. Patient has been advised to make an attempt to improve diet and exercise patterns to aid in weight loss.  OSA CPAP nightly    Nutrition Status:         Mobility: Able to ambulate independent  PT Orders: Active   PT Follow up Rec:     Goals of care   Code Status: Full Code     DVT prophylaxis:  Place and maintain sequential compression device Start: 03/27/24 0736 Place TED hose Start: 03/27/24 0736 SCDs Start: 03/27/24 0732 SCDs Start: 03/27/24 0409 apixaban  (ELIQUIS ) tablet 5 mg   Antimicrobials: IV cefepime , IV vancomycin  Fluid: Reduce NS to 50 mL/h Consultants: Oncology Family Communication: Wife at bedside  Status: Inpatient Level of care:  Telemetry   Patient is from: Home Needs to continue in-hospital care: Improving diarrhea.  Needs to continue inpatient care Anticipated d/c to: Hopefully home in 1 to 2 days   Diet:  Diet Order             Diet regular Room service appropriate? Yes; Fluid consistency: Thin  Diet effective now                   Scheduled Meds:  apixaban   5 mg Oral BID   dapagliflozin  propanediol  10 mg Oral Daily   feeding supplement  237 mL Oral BID BM   ferrous sulfate   325 mg Oral Q breakfast   gabapentin   300 mg Oral Q24H   rosuvastatin   40 mg Oral QHS   saccharomyces boulardii  250 mg Oral BID   sodium chloride  flush  3 mL Intravenous Q12H   sodium chloride  flush  3 mL Intravenous Q12H    PRN meds: acetaminophen  **OR** acetaminophen , hydrALAZINE , HYDROmorphone  (DILAUDID ) injection, ipratropium, loperamide , ondansetron  **OR** ondansetron  (ZOFRAN ) IV, oxyCODONE , traZODone    Infusions:   sodium chloride  125 mL/hr at 03/28/24 2217   ceFEPime  (MAXIPIME ) IV 2 g (03/29/24 1000)    Antimicrobials: Anti-infectives (From admission, onward)    Start     Dose/Rate Route Frequency  Ordered Stop   03/27/24 1100  ceFEPIme  (MAXIPIME ) 2 g in sodium chloride  0.9 % 100 mL IVPB        2 g 200 mL/hr over 30 Minutes Intravenous Every 8 hours 03/27/24 0734     03/27/24 0815  vancomycin  (VANCOCIN ) 50 mg/mL oral solution SOLN 125 mg  Status:  Discontinued        125 mg Oral Every 6 hours 03/27/24 0731 03/28/24 1403   03/27/24 0300  ceFEPIme  (MAXIPIME ) 2 g in sodium chloride  0.9 % 100 mL IVPB        2 g 200 mL/hr over 30 Minutes Intravenous  Once 03/27/24 0250 03/27/24 0331       Objective: Vitals:   03/29/24 0101 03/29/24 0354  BP:  (!) 140/70  Pulse:  62  Resp:  13 18  Temp:  98.1 F (36.7 C)  SpO2:  97%    Intake/Output Summary (Last 24 hours) at 03/29/2024 1323 Last data filed at 03/28/2024 2212 Gross per 24 hour  Intake 2082.83 ml  Output --  Net 2082.83 ml   Filed Weights   03/28/24 0500  Weight: 99.1 kg   Weight change:  Body mass index is 30.47 kg/m.   Physical Exam: General exam: Pleasant, elderly Caucasian male.  Not in distress.  Feels better.  In better spirits today Skin: No rashes, lesions or ulcers. HEENT: Atraumatic, normocephalic, no obvious bleeding Lungs: Clear to auscultation bilaterally,  CVS: S1, S2, no murmur,   GI/Abd: Soft, nontender, distended from obesity, bowel sound present,   CNS: Alert, awake, oriented x 3 Psychiatry: Mood appropriate Extremities: No pedal edema, no calf tenderness,   Data Review: I have personally reviewed the laboratory data and studies available.  F/u labs ordered Unresulted Labs (From admission, onward)     Start     Ordered   03/30/24 0500  Basic metabolic panel with GFR  Tomorrow morning,   R        03/29/24 1322   03/29/24 1322  Basic metabolic panel with GFR  ONCE - STAT,   STAT        03/29/24 1322   03/29/24 1322  CBC with Differential/Platelet  ONCE - STAT,   STAT        03/29/24 1322   03/29/24 0500  CBC with Differential/Platelet  Tomorrow morning,   R        03/28/24 0851   03/28/24  0500  CBC  Daily,   R      03/27/24 0733   03/27/24 0732  Expectorated Sputum Assessment w Gram Stain, Rflx to Resp Cult  Once,   R       Comments: If productive cough    03/27/24 0733   03/27/24 0525  GI pathogen panel by PCR, stool  Once,   R        03/27/24 0525            Signed, Chapman Rota, MD [facility] 03/29/2024  "

## 2024-03-29 NOTE — Plan of Care (Signed)
   Problem: Coping: Goal: Ability to adjust to condition or change in health will improve Outcome: Progressing   Problem: Fluid Volume: Goal: Ability to maintain a balanced intake and output will improve Outcome: Progressing   Problem: Health Behavior/Discharge Planning: Goal: Ability to manage health-related needs will improve Outcome: Progressing   Problem: Nutritional: Goal: Maintenance of adequate nutrition will improve Outcome: Progressing

## 2024-03-29 NOTE — Plan of Care (Signed)

## 2024-03-30 ENCOUNTER — Inpatient Hospital Stay

## 2024-03-30 ENCOUNTER — Other Ambulatory Visit: Payer: Self-pay | Admitting: Hematology

## 2024-03-30 DIAGNOSIS — C252 Malignant neoplasm of tail of pancreas: Secondary | ICD-10-CM

## 2024-03-30 DIAGNOSIS — D709 Neutropenia, unspecified: Secondary | ICD-10-CM | POA: Diagnosis not present

## 2024-03-30 DIAGNOSIS — R5081 Fever presenting with conditions classified elsewhere: Secondary | ICD-10-CM | POA: Diagnosis not present

## 2024-03-30 LAB — CBC WITH DIFFERENTIAL/PLATELET
Abs Immature Granulocytes: 0.11 K/uL — ABNORMAL HIGH (ref 0.00–0.07)
Basophils Absolute: 0 K/uL (ref 0.0–0.1)
Basophils Relative: 1 %
Eosinophils Absolute: 0.1 K/uL (ref 0.0–0.5)
Eosinophils Relative: 2 %
HCT: 34.2 % — ABNORMAL LOW (ref 39.0–52.0)
Hemoglobin: 10.9 g/dL — ABNORMAL LOW (ref 13.0–17.0)
Immature Granulocytes: 4 %
Lymphocytes Relative: 23 %
Lymphs Abs: 0.6 K/uL — ABNORMAL LOW (ref 0.7–4.0)
MCH: 27.3 pg (ref 26.0–34.0)
MCHC: 31.9 g/dL (ref 30.0–36.0)
MCV: 85.5 fL (ref 80.0–100.0)
Monocytes Absolute: 0.4 K/uL (ref 0.1–1.0)
Monocytes Relative: 17 %
Neutro Abs: 1.3 K/uL — ABNORMAL LOW (ref 1.7–7.7)
Neutrophils Relative %: 53 %
Platelets: 113 K/uL — ABNORMAL LOW (ref 150–400)
RBC: 4 MIL/uL — ABNORMAL LOW (ref 4.22–5.81)
RDW: 12.8 % (ref 11.5–15.5)
WBC: 2.5 K/uL — ABNORMAL LOW (ref 4.0–10.5)
nRBC: 0 % (ref 0.0–0.2)

## 2024-03-30 LAB — GI PATHOGEN PANEL BY PCR, STOOL

## 2024-03-30 LAB — BASIC METABOLIC PANEL WITH GFR
Anion gap: 10 (ref 5–15)
BUN: 14 mg/dL (ref 8–23)
CO2: 21 mmol/L — ABNORMAL LOW (ref 22–32)
Calcium: 8.3 mg/dL — ABNORMAL LOW (ref 8.9–10.3)
Chloride: 108 mmol/L (ref 98–111)
Creatinine, Ser: 0.77 mg/dL (ref 0.61–1.24)
GFR, Estimated: >60 mL/min
Glucose, Bld: 141 mg/dL — ABNORMAL HIGH (ref 70–99)
Potassium: 3.3 mmol/L — ABNORMAL LOW (ref 3.5–5.1)
Sodium: 139 mmol/L (ref 135–145)

## 2024-03-30 LAB — GLUCOSE, CAPILLARY
Glucose-Capillary: 124 mg/dL — ABNORMAL HIGH (ref 70–99)
Glucose-Capillary: 163 mg/dL — ABNORMAL HIGH (ref 70–99)

## 2024-03-30 MED ORDER — DIPHENHYDRAMINE-ZINC ACETATE 2-0.1 % EX CREA
TOPICAL_CREAM | Freq: Three times a day (TID) | CUTANEOUS | Status: DC | PRN
  Filled 2024-03-30: qty 28

## 2024-03-30 MED ORDER — CEFDINIR 300 MG PO CAPS: 300.0000 mg | ORAL_CAPSULE | Freq: Two times a day (BID) | ORAL | 0 refills | Status: AC

## 2024-03-30 MED ORDER — ACETAMINOPHEN 325 MG PO TABS: 650.0000 mg | ORAL_TABLET | Freq: Four times a day (QID) | ORAL | Status: AC | PRN

## 2024-03-30 MED ORDER — POTASSIUM CHLORIDE CRYS ER 20 MEQ PO TBCR
40.0000 meq | EXTENDED_RELEASE_TABLET | Freq: Once | ORAL | Status: AC
  Administered 2024-03-30: 40 meq via ORAL
  Filled 2024-03-30: qty 2

## 2024-03-30 MED ORDER — AMLODIPINE BESYLATE 10 MG PO TABS
10.0000 mg | ORAL_TABLET | Freq: Every day | ORAL | Status: DC
  Administered 2024-03-30: 10 mg via ORAL
  Filled 2024-03-30: qty 1

## 2024-03-30 MED ORDER — LACTINEX PO CHEW: 1.0000 | CHEWABLE_TABLET | Freq: Three times a day (TID) | ORAL | 0 refills | Status: AC

## 2024-03-30 MED ORDER — DIPHENHYDRAMINE-ZINC ACETATE 2-0.1 % EX CREA: TOPICAL_CREAM | Freq: Three times a day (TID) | CUTANEOUS | 0 refills | Status: AC | PRN

## 2024-03-30 NOTE — Progress Notes (Signed)
 Pt and pts wife informed of pts discharge paperwork. Pt informed this nurse of red itchy bumps on pts back. This nurse informed provider and see orders.  This nurse completed discharge teaching and education to pt and pts wife. Both expressed understanding, questions and concerns. Pt informed this nurse that pts  pharmacist asked if he'll be taking cefdinir  while taking his iron  supplements. This nurse informed provider and per provider pt okay to take a few hours a part.    Pt and wife had concerned that no CBC was collected on pt today.  Also pt and wife stated per [name] pt suppose to get filgrastim to help boost immune system. This nurse informed providers of concerns and requested to get a STAT CBC (see orders).  CBC collected and resulted WBC- 2.5, Hgb- 10.9, Plts- 113, ANC- 1.3. Providers notified and per [name] [facility] is improving, likely will recover on it's own, I think ok to hold GCSF. I will call him now. No additional questions and concerns and pt discharged without anymore questions or concerns.

## 2024-03-30 NOTE — Discharge Summary (Addendum)
 "  Physician Discharge Summary  [name] [name] FMW:[id number] DOB: Sep 21, 1950 DOA: 03/26/2024  PCP: [name], NP  Admit date: 03/26/2024 Discharge date: 03/30/2024  Admitted from: Home Discharge disposition: Home  Recommendations at discharge:  Complete the course of antibiotics with oral Omnicef  for next 5 days with probiotics Imodium  as needed for diarrhea Metformin  has been stopped.  Okay to continue Farxiga    Subjective: Patient was seen and examined this morning.  Lying down in bed.  Not in distress.  Diarrhea has stopped.  Last BM was yesterday 4 PM and it was already soft Afebrile, hemodynamically stable, breathing on room air. Labs this morning with potassium low at 3.3 Blood glucose level improving, 124 this morning.  Brief narrative: [name] [name] is a 74 y.o. male with PMH significant for pancreatic cancer on chemotherapy, obesity, OSA, DM2, HTN, HLD, A-fib, CHF, CAD, h/o GI bleeding. Patient received the first cycle of chemotherapy 2 weeks ago on 03/14/2024 1/10, patient presented to the ED with a fever of 102.1 at home, diarrhea for 2 days.  In the ED, patient had a temperature of 102.2, hemodynamically stable, breathing on room air Labs with WBC count 3.9, glucose level 250 otherwise unremarkable CMP, lactic acid normal Hepatitis panel nonreactive respiratory viral panel unremarkable Urinalysis unremarkable Chest x-ray unremarkable Blood culture sent Patient was started on broad-spectrum IV antibiotics.   Hospital course: Neutropenic fever Severe diarrhea Presented with fever, diarrhea in the setting of chemotherapy for pancreatic cancer  Absolute neutrophil count at 600. GI panel pending.  C. difficile assay negative. Blood cultures sent on admission has not shown any growth so far. Currently he is improving on empiric antibiotics and probiotics. Diarrhea has stopped.  Last BM was yesterday 4 PM and it was already soft.  Able to tolerate regular  consistency diet. Okay to discharge home today I would complete the course of antibiotics with Omnicef  for next 5 days with probiotics.  Continue Imodium  as needed.   Recent Labs  Lab 03/26/24 2111 03/27/24 0037 03/27/24 0436 03/28/24 1158 03/29/24 1305  WBC 1.5*  --  1.1* 2.1* 1.8*  LATICACIDVEN  --  0.7  --   --   --    AKI Hypokalemia Hypomagnesemia Hypophosphatemia On admission, had elevated creatinine, low electrolytes due to severe diarrhea Currently on IV fluid, continue electrolyte replacements. Potassium level low at 3.3 this morning.  Replacement ordered. Recent Labs  Lab 03/26/24 2111 03/27/24 0038 03/27/24 0436 03/27/24 1648 03/28/24 1158 03/29/24 1305 03/30/24 0723  NA 138 139  --  136 139 138 139  K 2.7* 2.8*  --  3.0* 3.7 3.3* 3.3*  CL 102 102  --  106 107 108 108  CO2 22  --   --  17* 22 22 21*  GLUCOSE 180* 153*  --  139* 181* 210* 141*  BUN 19 19  --  20 19 20 14   CREATININE 1.16 1.20 1.24 1.37* 1.08 0.90 0.77  CALCIUM  8.7*  --   --  7.9* 8.4* 8.4* 8.3*  MG  --   --  1.5*  --  2.1  --   --   PHOS  --   --  3.0  --  1.6*  --   --     Stage IIa pancreatic cancer  Last chemotherapy approximately 2 weeks ago on 03/14/2024 Continue follow-up with oncologist for treatment plan-[name] Pain regimen --- Scheduled:  --- PRN: IV Dilaudid , oral oxycodone , Tylenol   Chemo-induced pancytopenia Chronic anemia Thrombocytopenia  All cell lines are depressed. Seen by oncologist [name]. Continue iron  supplement.   Continue to monitor hemoglobin and platelet count as an outpatient. Recent Labs  Lab 03/26/24 2111 03/27/24 0038 03/27/24 0436 03/28/24 1158 03/29/24 1305  WBC 1.5*  --  1.1* 2.1* 1.8*  NEUTROABS 0.6*  --   --   --  0.8*  HGB 12.2* 11.9* 10.6* 11.1* 10.2*  HCT 38.3* 35.0* 33.5* 34.1* 31.7*  MCV 87.2  --  88.2 86.3 86.1  PLT 92*  --  76* 92* 96*   Chronic diastolic CHF HTN Last echo from July 2025 with EF 60 to 65% Continue amlodipine   as before.  A-fib Not on any rate control medications Chronically anticoagulated with Eliquis .    Type 2 diabetes mellitus Diabetic neuropathy A1c 7.4 on 03/27/24 PTA meds-metformin , Farxiga .  Gastric level improving after Farxiga  was resumed. Patient reports metformin  was recently added. Going forward I do not think he needs metformin .  Continue gabapentin  for neuropathy Recent Labs  Lab 03/29/24 1143 03/29/24 1719 03/29/24 2128 03/30/24 0721 03/30/24 1155  GLUCAP 187* 175* 169* 124* 163*   CAD/HLD Continue Eliquis  and statin   Hypogonadism male Continue testosterone  supplement  Obesity 1 Body mass index is 30.47 kg/m. Patient has been advised to make an attempt to improve diet and exercise patterns to aid in weight loss.  OSA CPAP nightly     Mobility: Able to ambulate independent  Goals of care   Code Status: Full Code   Diet:  Diet Order             Diet general           Diet regular Room service appropriate? Yes; Fluid consistency: Thin  Diet effective now                   Nutritional status:  Body mass index is 30.47 kg/m.       Wounds:  - Wound 03/07/24 1010 Surgical Closed Surgical Incision Chest Right (Active)  Date First Assessed/Time First Assessed: 03/07/24 1010   Primary Wound Type: Surgical  Secondary Wound Type - Surgical: Closed Surgical Incision  Location: Chest  Location Orientation: Right    Assessments 03/07/2024 10:15 AM 03/29/2024  7:35 PM  Site / Wound Assessment Clean;Dry Clean;Dry  Closure Skin glue Skin glue  Drainage Amount None None  Dressing Type None --  Margins -- Attached edges (approximated)     No associated orders.    Discharge Medications:   Allergies as of 03/30/2024       Reactions   Yellow Jacket Venom Anaphylaxis        Medication List     STOP taking these medications    metFORMIN  500 MG 24 hr tablet Commonly known as: GLUCOPHAGE -XR   tadalafil  20 MG tablet Commonly known as:  Cialis        TAKE these medications    Accu-Chek FastClix Lancets Misc 1 each by Does not apply route daily.   Accu-Chek Guide test strip Generic drug: glucose blood Use one daily to check blood sugar   acetaminophen  325 MG tablet Commonly known as: TYLENOL  Take 2 tablets (650 mg total) by mouth every 6 (six) hours as needed for mild pain (pain score 1-3) (or Fever >/= 101).   amLODipine  10 MG tablet Commonly known as: NORVASC  TAKE 1 TABLET BY MOUTH EVERY DAY FOR BLOOD PRESSURE   apixaban  5 MG Tabs tablet Commonly known as: ELIQUIS  Take 1 tablet (5 mg total) by  mouth 2 (two) times daily.   cefdinir  300 MG capsule Commonly known as: OMNICEF  Take 1 capsule (300 mg total) by mouth 2 (two) times daily for 5 days.   dexamethasone  4 MG tablet Commonly known as: DECADRON  Take 2 tablets (8 mg total) by mouth daily. Take 2 tablets daily x 3 days starting the day after chemotherapy. Take with food.   diphenhydrAMINE -zinc  acetate cream Commonly known as: BENADRYL  Apply topically 3 (three) times daily as needed for itching.   EPINEPHrine  0.3 mg/0.3 mL Soaj injection Commonly known as: EPI-PEN Inject 0.3 mg into the muscle as needed for anaphylaxis.   Farxiga  10 MG Tabs tablet Generic drug: dapagliflozin  propanediol TAKE 1 TABLET (10 MG TOTAL) BY MOUTH DAILY BEFORE BREAKFAST. FOR DIABETES.   gabapentin  300 MG capsule Commonly known as: NEURONTIN  Take 300 mg by mouth 1 day or 1 dose.   Glucosamine 1500 Complex Caps Take 1 capsule by mouth daily.   Iron  (Ferrous Sulfate ) 325 (65 Fe) MG Tabs Take 65 mg by mouth daily with breakfast.   lactobacillus acidophilus & bulgar chewable tablet Chew 1 tablet by mouth 3 (three) times daily with meals for 7 days.   lidocaine -prilocaine  cream Commonly known as: EMLA  Apply to affected area once   loperamide  2 MG capsule Commonly known as: IMODIUM  Take 2 tabs by mouth with first loose stool, then 1 tab with each additional loose  stool as needed. Do not exceed 8 tabs in a 24-hour period   multivitamin with minerals tablet Take 1 tablet by mouth daily with breakfast.   ondansetron  8 MG tablet Commonly known as: Zofran  Take 1 tablet (8 mg total) by mouth every 8 (eight) hours as needed for nausea or vomiting. Start on the third day after chemotherapy   pantoprazole  40 MG tablet Commonly known as: Protonix  Take 1 tablet (40 mg total) by mouth daily.   PRESCRIPTION MEDICATION CPAP- At bedtime   prochlorperazine  10 MG tablet Commonly known as: COMPAZINE  Take 1 tablet (10 mg total) by mouth every 6 (six) hours as needed for nausea or vomiting (Nausea or vomiting).   rosuvastatin  40 MG tablet Commonly known as: CRESTOR  Take 1 tablet (40 mg total) by mouth at bedtime.   Testosterone  20.25 MG/ACT (1.62%) Gel APPLY 3 PUMPS TO THE SHOULDER AND ARM DAILY               Discharge Care Instructions  (From admission, onward)           Start     Ordered   03/30/24 0000  Discharge wound care:        03/30/24 1303             Follow ups:    Follow-up Information     [name], NP Follow up.   Specialty: Internal Medicine Contact information: [address] [name] [address] [phone]                 Discharge Instructions:   Discharge Instructions     Call MD for:  difficulty breathing, headache or visual disturbances   Complete by: As directed    Call MD for:  extreme fatigue   Complete by: As directed    Call MD for:  hives   Complete by: As directed    Call MD for:  persistant dizziness or light-headedness   Complete by: As directed    Call MD for:  persistant nausea and vomiting   Complete by: As directed  Call MD for:  severe uncontrolled pain   Complete by: As directed    Call MD for:  temperature >100.4   Complete by: As directed    Diet general   Complete by: As directed    Discharge instructions   Complete by: As directed     Recommendations at discharge:   Complete the course of antibiotics with oral Omnicef  for next 5 days with probiotics  Imodium  as needed for diarrhea  Metformin  has been stopped.  Okay to continue Farxiga   General discharge instructions: Follow with Primary MD [name], NP in 7 days  Please request your PCP  to go over your hospital tests, procedures, radiology results at the follow up. Please get your medicines reviewed and adjusted.  Your PCP may decide to repeat certain labs or tests as needed. Do not drive, operate heavy machinery, perform activities at heights, swimming or participation in water activities or provide baby sitting services if your were admitted for syncope or siezures until you have seen by Primary MD or a Neurologist and advised to do so again. [place]  Controlled Substance Reporting System database was reviewed. Do not drive, operate heavy machinery, perform activities at heights, swim, participate in water activities or provide baby-sitting services while on medications for pain, sleep and mood until your outpatient physician has reevaluated you and advised to do so again.  You are strongly recommended to comply with the dose, frequency and duration of prescribed medications. Activity: As tolerated with Full fall precautions use walker/cane & assistance as needed Avoid using any recreational substances like cigarette, tobacco, alcohol, or non-prescribed drug. If you experience worsening of your admission symptoms, develop shortness of breath, life threatening emergency, suicidal or homicidal thoughts you must seek medical attention immediately by calling 911 or calling your MD immediately  if symptoms less severe. You must read complete instructions/literature along with all the possible adverse reactions/side effects for all the medicines you take and that have been prescribed to you. Take any new medicine only after you have completely understood and accepted  all the possible adverse reactions/side effects.  Wear Seat belts while driving. You were cared for by a hospitalist during your hospital stay. If you have any questions about your discharge medications or the care you received while you were in the hospital after you are discharged, you can call the unit and ask to speak with the hospitalist or the covering physician. Once you are discharged, your primary care physician will handle any further medical issues. Please note that NO REFILLS for any discharge medications will be authorized once you are discharged, as it is imperative that you return to your primary care physician (or establish a relationship with a primary care physician if you do not have one).   Discharge wound care:   Complete by: As directed    Increase activity slowly   Complete by: As directed        Discharge Exam:   Vitals:   03/30/24 0002 03/30/24 0456 03/30/24 1215 03/30/24 1329  BP:  (!) 143/76 (!) 143/77 130/69  Pulse: (!) 59 (!) 58  66  Resp: 18 18  14   Temp:  98.2 F (36.8 C)  98.9 F (37.2 C)  TempSrc:  Oral  Oral  SpO2: 97% 98%  98%  Weight:      Height:        Body mass index is 30.47 kg/m.  General exam: Pleasant, elderly Caucasian male.  Not in distress.  Feels  better.  In better spirits today Skin: No rashes, lesions or ulcers. HEENT: Atraumatic, normocephalic, no obvious bleeding Lungs: Clear to auscultation bilaterally,  CVS: S1, S2, no murmur,   GI/Abd: Soft, nontender, distended from obesity, bowel sound present,   CNS: Alert, awake, oriented x 3 Psychiatry: Mood appropriate Extremities: No pedal edema, no calf tenderness,    The results of significant diagnostics from this hospitalization (including imaging, microbiology, ancillary and laboratory) are listed below for reference.    Procedures and Diagnostic Studies:   DG Chest 2 View Result Date: 03/27/2024 EXAM: 2 VIEW(S) XRAY OF THE CHEST 03/27/2024 12:13:00 AM COMPARISON:  03/07/2024 CLINICAL HISTORY: Fever FINDINGS: LINES, TUBES AND DEVICES: Right chest Port-A-Cath in place with tip overlying the expected region of the mid superior vena cava. LUNGS AND PLEURA: No focal pulmonary opacity. Blunting of bilateral costophrenic angles. No pneumothorax. HEART AND MEDIASTINUM: No acute abnormality of the cardiac and mediastinal silhouettes. Aortic atherosclerosis. BONES AND SOFT TISSUES: No acute osseous abnormality. IMPRESSION: 1. Blunting of bilateral costophrenic angles. Possible trace pleural effusion versus scarring. 2. Right chest Port-A-Cath with tip overlying the mid superior vena cava. Electronically signed by: Kate Plummer MD MD 03/27/2024 12:16 AM EST RP Workstation: [id number]     Labs:   Basic Metabolic Panel: Recent Labs  Lab 03/26/24 2111 03/27/24 0038 03/27/24 0436 03/27/24 1648 03/28/24 1158 03/29/24 1305 03/30/24 0723  NA 138 139  --  136 139 138 139  K 2.7* 2.8*  --  3.0* 3.7 3.3* 3.3*  CL 102 102  --  106 107 108 108  CO2 22  --   --  17* 22 22 21*  GLUCOSE 180* 153*  --  139* 181* 210* 141*  BUN 19 19  --  20 19 20 14   CREATININE 1.16 1.20 1.24 1.37* 1.08 0.90 0.77  CALCIUM  8.7*  --   --  7.9* 8.4* 8.4* 8.3*  MG  --   --  1.5*  --  2.1  --   --   PHOS  --   --  3.0  --  1.6*  --   --    GFR Estimated Creatinine Clearance: 98.6 mL/min (by C-G formula based on SCr of 0.77 mg/dL). Liver Function Tests: Recent Labs  Lab 03/26/24 2111  AST 14*  ALT 9  ALKPHOS 60  BILITOT 0.6  PROT 6.6  ALBUMIN 3.8   Recent Labs  Lab 03/26/24 2111  LIPASE 29   No results for input(s): AMMONIA in the last 168 hours. Coagulation profile No results for input(s): INR, PROTIME in the last 168 hours.  CBC: Recent Labs  Lab 03/26/24 2111 03/27/24 0038 03/27/24 0436 03/28/24 1158 03/29/24 1305  WBC 1.5*  --  1.1* 2.1* 1.8*  NEUTROABS 0.6*  --   --   --  0.8*  HGB 12.2* 11.9* 10.6* 11.1* 10.2*  HCT 38.3* 35.0* 33.5* 34.1* 31.7*  MCV  87.2  --  88.2 86.3 86.1  PLT 92*  --  76* 92* 96*   Cardiac Enzymes: No results for input(s): CKTOTAL, CKMB, CKMBINDEX, TROPONINI in the last 168 hours. BNP: Invalid input(s): POCBNP CBG: Recent Labs  Lab 03/29/24 1143 03/29/24 1719 03/29/24 2128 03/30/24 0721 03/30/24 1155  GLUCAP 187* 175* 169* 124* 163*   D-Dimer No results for input(s): DDIMER in the last 72 hours. Hgb A1c No results for input(s): HGBA1C in the last 72 hours. Lipid Profile No results for input(s): CHOL, HDL, LDLCALC, TRIG, CHOLHDL, LDLDIRECT in the last 72  hours. Thyroid function studies No results for input(s): TSH, T4TOTAL, T3FREE, THYROIDAB in the last 72 hours.  Invalid input(s): FREET3 Anemia work up No results for input(s): VITAMINB12, FOLATE, FERRITIN, TIBC, IRON , RETICCTPCT in the last 72 hours. Microbiology Recent Results (from the past 240 hours)  Blood culture (routine x 2)     Status: None (Preliminary result)   Collection Time: 03/26/24  9:11 PM   Specimen: Right Antecubital; Blood  Result Value Ref Range Status   Specimen Description   Final    RIGHT ANTECUBITAL Performed at [facility], [address] [place], [place] [address]    Special Requests   Final    BOTTLES DRAWN AEROBIC AND ANAEROBIC Blood Culture adequate volume Performed at [facility], [address] [place], [place] [address]    Culture   Final    NO GROWTH 3 DAYS Performed at [facility], [address] [place], [place] [address]    Report Status PENDING  Incomplete  Resp panel by RT-PCR (RSV, Flu A&B, Covid) Anterior Nasal Swab     Status: None   Collection Time: 03/27/24 12:43 AM   Specimen: Anterior Nasal Swab  Result Value Ref Range Status   SARS Coronavirus 2 by RT PCR NEGATIVE NEGATIVE Final    Comment: (NOTE) SARS-CoV-2 target nucleic acids are NOT DETECTED.  The SARS-CoV-2 RNA is generally detectable in  upper respiratory specimens during the acute phase of infection. The lowest concentration of SARS-CoV-2 viral copies this assay can detect is 138 copies/mL. A negative result does not preclude SARS-Cov-2 infection and should not be used as the sole basis for treatment or other patient management decisions. A negative result may occur with  improper specimen collection/handling, submission of specimen other than nasopharyngeal swab, presence of viral mutation(s) within the areas targeted by this assay, and inadequate number of viral copies(<138 copies/mL). A negative result must be combined with clinical observations, patient history, and epidemiological information. The expected result is Negative.  Fact Sheet for Patients:  bloggercourse.com  Fact Sheet for Healthcare Providers:  seriousbroker.it  This test is no t yet approved or cleared by the [place]  FDA and  has been authorized for detection and/or diagnosis of SARS-CoV-2 by FDA under an Emergency Use Authorization (EUA). This EUA will remain  in effect (meaning this test can be used) for the duration of the COVID-19 declaration under Section 564(b)(1) of the Act, 21 U.S.C.section 360bbb-3(b)(1), unless the authorization is terminated  or revoked sooner.       Influenza A by PCR NEGATIVE NEGATIVE Final   Influenza B by PCR NEGATIVE NEGATIVE Final    Comment: (NOTE) The Xpert Xpress SARS-CoV-2/FLU/RSV plus assay is intended as an aid in the diagnosis of influenza from Nasopharyngeal swab specimens and should not be used as a sole basis for treatment. Nasal washings and aspirates are unacceptable for Xpert Xpress SARS-CoV-2/FLU/RSV testing.  Fact Sheet for Patients: bloggercourse.com  Fact Sheet for Healthcare Providers: seriousbroker.it  This test is not yet approved or cleared by the [place]  FDA and has been  authorized for detection and/or diagnosis of SARS-CoV-2 by FDA under an Emergency Use Authorization (EUA). This EUA will remain in effect (meaning this test can be used) for the duration of the COVID-19 declaration under Section 564(b)(1) of the Act, 21 U.S.C. section 360bbb-3(b)(1), unless the authorization is terminated or revoked.     Resp Syncytial Virus by PCR NEGATIVE NEGATIVE Final    Comment: (NOTE) Fact  Sheet for Patients: bloggercourse.com  Fact Sheet for Healthcare Providers: seriousbroker.it  This test is not yet approved or cleared by the [place]  FDA and has been authorized for detection and/or diagnosis of SARS-CoV-2 by FDA under an Emergency Use Authorization (EUA). This EUA will remain in effect (meaning this test can be used) for the duration of the COVID-19 declaration under Section 564(b)(1) of the Act, 21 U.S.C. section 360bbb-3(b)(1), unless the authorization is terminated or revoked.  Performed at [facility], [address], [place], [place] [address]   Blood culture (routine x 2)     Status: None (Preliminary result)   Collection Time: 03/27/24  2:58 AM   Specimen: Left Antecubital; Blood  Result Value Ref Range Status   Specimen Description   Final    LEFT ANTECUBITAL Performed at [facility], [address] [place], [place] [address]    Special Requests   Final    BOTTLES DRAWN AEROBIC AND ANAEROBIC Blood Culture results may not be optimal due to an inadequate volume of blood received in culture bottles Performed at [facility], [address], [place], [place] [address]    Culture   Final    NO GROWTH 3 DAYS Performed at [facility], [address] [place], [place] [address]    Report Status PENDING  Incomplete  C Difficile Quick Screen w PCR reflex     Status: None   Collection Time: 03/27/24  5:25 AM   Specimen: STOOL   Result Value Ref Range Status   C Diff antigen NEGATIVE NEGATIVE Final   C Diff toxin NEGATIVE NEGATIVE Final   C Diff interpretation No C. difficile detected.  Final    Comment: Performed at [facility], [address], [place], [place] [address]    Time coordinating discharge: 45 minutes  Signed: Chapman Guled Gahan  [facility] 03/30/2024, 1:59 PM  "

## 2024-03-30 NOTE — Plan of Care (Signed)

## 2024-03-30 NOTE — Progress Notes (Signed)
" °   03/30/24 0002  BiPAP/CPAP/SIPAP  BiPAP/CPAP/SIPAP Pt Type Adult  BiPAP/CPAP/SIPAP DREAMSTATIOND  Mask Type Nasal pillows  Dentures removed? Not applicable  Respiratory Rate 18 breaths/min  FiO2 (%) 21 %  Patient Home Machine No  Patient Home Mask Yes  Patient Home Tubing No  Auto Titrate Yes  Minimum cmH2O 4 cmH2O  Maximum cmH2O 20 cmH2O  Device Plugged into RED Power Outlet Yes  BiPAP/CPAP /SiPAP Vitals  Pulse Rate (!) 59  Resp 18  SpO2 97 %  Bilateral Breath Sounds Clear    "

## 2024-03-31 ENCOUNTER — Telehealth: Payer: Self-pay

## 2024-03-31 ENCOUNTER — Other Ambulatory Visit: Payer: Self-pay | Admitting: Hematology

## 2024-03-31 DIAGNOSIS — C252 Malignant neoplasm of tail of pancreas: Secondary | ICD-10-CM

## 2024-03-31 NOTE — Patient Instructions (Signed)
 Visit Information  Thank you for taking time to visit with me today. Please don't hesitate to contact me if I can be of assistance to you   Patient instructions  take antibiotics until completed.   notify provider for any new / ongoing symptoms. Keep follow up with providers Send message to primary care provider inquiring if hospital follow up visit is needed    Patient verbalizes understanding of instructions and care plan provided today and agrees to view in MyChart. Active MyChart status and patient understanding of how to access instructions and care plan via MyChart confirmed with patient.     The patient has been provided with contact information for the care management team and has been advised to call with any health related questions or concerns.   Please call the care guide team at [phone] if you need to cancel or reschedule your appointment.   Please call the Suicide and Crisis Lifeline: 988 call the [place]  National Suicide Prevention Lifeline: 361-053-6994 or TTY: (534)275-7462 TTY 925-496-5690) to talk to a trained counselor call 1-800-273-TALK (toll free, 24 hour hotline) if you are experiencing a Mental Health or Behavioral Health Crisis or need someone to talk to.  Arvin Seip RN, BSN, CCM [employer], Population Health Case Manager Phone: [phone]

## 2024-03-31 NOTE — Transitions of Care (Post Inpatient/ED Visit) (Signed)
 "  03/31/2024  Name: [name] [name] MRN: [id number] DOB: 27-Dec-1950  Today's TOC FU Call Status: Today's TOC FU Call Status:: Successful TOC FU Call Completed TOC FU Call Complete Date: 03/31/24  Patient's Name and Date of Birth confirmed. Name, DOB  Transition Care Management Follow-up Telephone Call Date of Discharge: 03/30/24 Discharge Facility: [facility]) Type of Discharge: Inpatient Admission Primary Inpatient Discharge Diagnosis:: Neutropenic fever  Severe diarrhea How have you been since you were released from the hospital?: Better Any questions or concerns?: No  Items Reviewed: Did you receive and understand the discharge instructions provided?: Yes Medications obtained,verified, and reconciled?: Yes (Medications Reviewed) Any new allergies since your discharge?: No Dietary orders reviewed?: Yes Type of Diet Ordered:: regular diet Do you have support at home?: Yes People in Home [RPT]: spouse Name of Support/Comfort Primary Source: [name] [name]  Medications Reviewed Today: Medications Reviewed Today     Reviewed by Gwendola Hornaday E, RN (Registered Nurse) on 03/31/24 at 1221  Med List Status: <None>   Medication Order Taking? Sig Documenting Provider Last Dose Status Informant  Accu-Chek FastClix Lancets MISC [id number] Yes 1 each by Does not apply route daily. Joyce Norleen BROCKS, MD  Active Self, Pharmacy Records  acetaminophen  (TYLENOL ) 325 MG tablet [id number] Yes Take 2 tablets (650 mg total) by mouth every 6 (six) hours as needed for mild pain (pain score 1-3) (or Fever >/= 101). Arlice Reichert, MD  Active   amLODipine  (NORVASC ) 10 MG tablet [id number] Yes TAKE 1 TABLET BY MOUTH EVERY DAY FOR BLOOD PRESSURE Clark, Katherine K, NP  Active Self, Pharmacy Records  apixaban  (ELIQUIS ) 5 MG TABS tablet [id number] Yes Take 1 tablet (5 mg total) by mouth 2 (two) times daily. Burnette Fallow, MD  Active Self, Pharmacy Records  cefdinir  (OMNICEF ) 300 MG capsule [id number] Yes Take 1  capsule (300 mg total) by mouth 2 (two) times daily for 5 days. Arlice Reichert, MD  Active   dapagliflozin  propanediol (FARXIGA ) 10 MG TABS tablet [id number] Yes TAKE 1 TABLET (10 MG TOTAL) BY MOUTH DAILY BEFORE BREAKFAST. FOR DIABETES. [name], NP  Active Self, Pharmacy Records  dexamethasone  (DECADRON ) 4 MG tablet [id number] Yes Take 2 tablets (8 mg total) by mouth daily. Take 2 tablets daily x 3 days starting the day after chemotherapy. Take with food. [name], MD  Active Self, Pharmacy Records  diphenhydrAMINE -zinc  acetate (BENADRYL ) cream [id number] Yes Apply topically 3 (three) times daily as needed for itching. Arlice Reichert, MD  Active   EPINEPHrine  0.3 mg/0.3 mL IJ SOAJ injection [id number] Yes Inject 0.3 mg into the muscle as needed for anaphylaxis. [name], NP  Active Self, Pharmacy Records  gabapentin  (NEURONTIN ) 300 MG capsule [id number] Yes Take 300 mg by mouth 1 day or 1 dose. [provider]  Active Self, Pharmacy Records  Glucosamine-Chondroit-Vit C-Mn (GLUCOSAMINE 1500 COMPLEX) CAPS [id number]  Take 1 capsule by mouth daily.   Patient not taking: Reported on 03/31/2024   [provider]  Active Self, Pharmacy Records  glucose blood (ACCU-CHEK GUIDE) test strip [id number] Yes Use one daily to check blood sugar Joyce Norleen BROCKS, MD  Active Self, Pharmacy Records  Iron , Ferrous Sulfate , 325 (65 Fe) MG TABS [id number] Yes Take 65 mg by mouth daily with breakfast. Boscia, Heather E, NP  Active Self, Pharmacy Records  lactobacillus acidophilus & bulgar (LACTINEX) chewable tablet [id number]  Chew 1 tablet by mouth 3 (three) times daily with meals for 7 days.  Patient not taking: Reported  on 03/31/2024   Arlice Reichert, MD  Active   lidocaine -prilocaine  (EMLA ) cream [id number] Yes Apply to affected area once [name], MD  Active Self, Pharmacy Records  loperamide  (IMODIUM ) 2 MG capsule [id number] Yes Take 2 tabs by mouth with first loose stool, then 1 tab with each  additional loose stool as needed. Do not exceed 8 tabs in a 24-hour period [name], MD  Active Self, Pharmacy Records  Multiple Vitamins-Minerals (MULTIVITAMIN WITH MINERALS) tablet [id number] Yes Take 1 tablet by mouth daily with breakfast. [provider]  Active Self, Pharmacy Records  ondansetron  (ZOFRAN ) 8 MG tablet [id number] Yes Take 1 tablet (8 mg total) by mouth every 8 (eight) hours as needed for nausea or vomiting. Start on the third day after chemotherapy [name], MD  Active Self, Pharmacy Records  pantoprazole  (PROTONIX ) 40 MG tablet [id number] Yes Take 1 tablet (40 mg total) by mouth daily. Hanford Powell BRAVO, NP  Active Self, Pharmacy Records  PRESCRIPTION MEDICATION [id number] Yes CPAP- At bedtime [provider]  Active Self, Pharmacy Records  prochlorperazine  (COMPAZINE ) 10 MG tablet [id number] Yes Take 1 tablet (10 mg total) by mouth every 6 (six) hours as needed for nausea or vomiting (Nausea or vomiting). [name], MD  Active Self, Pharmacy Records  rosuvastatin  (CRESTOR ) 40 MG tablet [id number] Yes Take 1 tablet (40 mg total) by mouth at bedtime. Cheryle Page, MD  Active Self, Pharmacy Records  Testosterone  20.25 MG/ACT (1.62%) GEL [id number] Yes APPLY 3 PUMPS TO THE SHOULDER AND ARM DAILY Clark, Katherine K, NP  Active Self, Pharmacy Records            Home Care and Equipment/Supplies: Were Home Health Services Ordered?: No Any new equipment or medical supplies ordered?: No  Functional Questionnaire: Do you need assistance with bathing/showering or dressing?: No Do you need assistance with meal preparation?: No Do you need assistance with eating?: No Do you have difficulty maintaining continence: No Do you need assistance with getting out of bed/getting out of a chair/moving?: No Do you have difficulty managing or taking your medications?: No  Follow up appointments reviewed: PCP Follow-up appointment confirmed?: No (patient states he will make a  decision to call. Advised to send primary provider message to inquire if hospital follow up visit is needed.) Specialist Hospital Follow-up appointment confirmed?: Yes Date of Specialist follow-up appointment?: 04/04/24 Follow-Up Specialty Provider:: [name] Do you need transportation to your follow-up appointment?: No Do you understand care options if your condition(s) worsen?: Yes-patient verbalized understanding  SDOH Interventions Today    Flowsheet Row Most Recent Value  SDOH Interventions   Food Insecurity Interventions Intervention Not Indicated  Housing Interventions Intervention Not Indicated  Transportation Interventions Intervention Not Indicated  Utilities Interventions Intervention Not Indicated   Discussed and offered 30 day TOC program.  Patient declined.  The patient has been provided with contact information for the care management team and has been advised to call with any health -related questions or concerns.  The patient verbalized understanding with current plan of care.  The patient is directed to their insurance card regarding availability of benefits coverage.    Arvin Seip RN, BSN, CCM [employer], Population Health Case Manager Phone: [phone]  "

## 2024-04-01 LAB — CULTURE, BLOOD (ROUTINE X 2)
Culture: NO GROWTH
Culture: NO GROWTH
Special Requests: ADEQUATE

## 2024-04-03 NOTE — Assessment & Plan Note (Signed)
 Adenocarcinoma in tail of pancreas, cT3N0M0 -Patient was admitted in early December 2025 for GI bleeding, CT scan incidentally showed a 5.8 cm mass in the tail of pancreas, no evidence of nodal or distant metastasis.  CT scan showed possible invasion to gastric wall. -He underwent a EUS biopsy which confirmed adenocarcinoma.  Tumor invades splenic artery, no other vascular involvement. -Pt was seen by surgeon [name]. We recommend neoadjuvant chemo. He stared FOLFIRINOX on 03/14/2024.  He was admitted for neutropenic fever and diarrhea after first cycle chemo.

## 2024-04-04 ENCOUNTER — Inpatient Hospital Stay: Attending: Hematology

## 2024-04-04 ENCOUNTER — Inpatient Hospital Stay (HOSPITAL_BASED_OUTPATIENT_CLINIC_OR_DEPARTMENT_OTHER): Admitting: Hematology

## 2024-04-04 VITALS — BP 130/69 | HR 66 | Temp 97.6°F | Resp 17 | Wt 216.2 lb

## 2024-04-04 DIAGNOSIS — Z5111 Encounter for antineoplastic chemotherapy: Secondary | ICD-10-CM | POA: Diagnosis present

## 2024-04-04 DIAGNOSIS — Z79631 Long term (current) use of antimetabolite agent: Secondary | ICD-10-CM | POA: Insufficient documentation

## 2024-04-04 DIAGNOSIS — D701 Agranulocytosis secondary to cancer chemotherapy: Secondary | ICD-10-CM | POA: Diagnosis not present

## 2024-04-04 DIAGNOSIS — C252 Malignant neoplasm of tail of pancreas: Secondary | ICD-10-CM | POA: Diagnosis not present

## 2024-04-04 DIAGNOSIS — G4730 Sleep apnea, unspecified: Secondary | ICD-10-CM | POA: Diagnosis not present

## 2024-04-04 DIAGNOSIS — E291 Testicular hypofunction: Secondary | ICD-10-CM | POA: Diagnosis not present

## 2024-04-04 DIAGNOSIS — Z79890 Hormone replacement therapy (postmenopausal): Secondary | ICD-10-CM | POA: Diagnosis not present

## 2024-04-04 DIAGNOSIS — Z7901 Long term (current) use of anticoagulants: Secondary | ICD-10-CM | POA: Insufficient documentation

## 2024-04-04 DIAGNOSIS — Z79630 Long term (current) use of alkylating agent: Secondary | ICD-10-CM | POA: Diagnosis not present

## 2024-04-04 DIAGNOSIS — K5900 Constipation, unspecified: Secondary | ICD-10-CM | POA: Diagnosis not present

## 2024-04-04 DIAGNOSIS — D696 Thrombocytopenia, unspecified: Secondary | ICD-10-CM | POA: Diagnosis not present

## 2024-04-04 DIAGNOSIS — Z7984 Long term (current) use of oral hypoglycemic drugs: Secondary | ICD-10-CM | POA: Insufficient documentation

## 2024-04-04 DIAGNOSIS — I10 Essential (primary) hypertension: Secondary | ICD-10-CM | POA: Diagnosis not present

## 2024-04-04 DIAGNOSIS — R197 Diarrhea, unspecified: Secondary | ICD-10-CM | POA: Diagnosis not present

## 2024-04-04 DIAGNOSIS — Z79634 Long term (current) use of topoisomerase inhibitor: Secondary | ICD-10-CM | POA: Diagnosis not present

## 2024-04-04 DIAGNOSIS — I2510 Atherosclerotic heart disease of native coronary artery without angina pectoris: Secondary | ICD-10-CM | POA: Insufficient documentation

## 2024-04-04 DIAGNOSIS — T451X5A Adverse effect of antineoplastic and immunosuppressive drugs, initial encounter: Secondary | ICD-10-CM | POA: Insufficient documentation

## 2024-04-04 DIAGNOSIS — Z79899 Other long term (current) drug therapy: Secondary | ICD-10-CM | POA: Insufficient documentation

## 2024-04-04 DIAGNOSIS — E119 Type 2 diabetes mellitus without complications: Secondary | ICD-10-CM | POA: Diagnosis not present

## 2024-04-04 DIAGNOSIS — I4891 Unspecified atrial fibrillation: Secondary | ICD-10-CM | POA: Diagnosis not present

## 2024-04-04 DIAGNOSIS — Z5189 Encounter for other specified aftercare: Secondary | ICD-10-CM | POA: Insufficient documentation

## 2024-04-04 LAB — CBC WITH DIFFERENTIAL (CANCER CENTER ONLY)
Abs Immature Granulocytes: 0.31 K/uL — ABNORMAL HIGH (ref 0.00–0.07)
Basophils Absolute: 0 K/uL (ref 0.0–0.1)
Basophils Relative: 1 %
Eosinophils Absolute: 0 K/uL (ref 0.0–0.5)
Eosinophils Relative: 1 %
HCT: 34 % — ABNORMAL LOW (ref 39.0–52.0)
Hemoglobin: 11 g/dL — ABNORMAL LOW (ref 13.0–17.0)
Immature Granulocytes: 6 %
Lymphocytes Relative: 13 %
Lymphs Abs: 0.7 K/uL (ref 0.7–4.0)
MCH: 27.2 pg (ref 26.0–34.0)
MCHC: 32.4 g/dL (ref 30.0–36.0)
MCV: 84.2 fL (ref 80.0–100.0)
Monocytes Absolute: 0.6 K/uL (ref 0.1–1.0)
Monocytes Relative: 11 %
Neutro Abs: 3.7 K/uL (ref 1.7–7.7)
Neutrophils Relative %: 68 %
Platelet Count: 172 K/uL (ref 150–400)
RBC: 4.04 MIL/uL — ABNORMAL LOW (ref 4.22–5.81)
RDW: 13.4 % (ref 11.5–15.5)
WBC Count: 5.3 K/uL (ref 4.0–10.5)
nRBC: 0 % (ref 0.0–0.2)

## 2024-04-04 LAB — CMP (CANCER CENTER ONLY)
ALT: 30 U/L (ref 0–44)
AST: 27 U/L (ref 15–41)
Albumin: 3.9 g/dL (ref 3.5–5.0)
Alkaline Phosphatase: 57 U/L (ref 38–126)
Anion gap: 10 (ref 5–15)
BUN: 19 mg/dL (ref 8–23)
CO2: 28 mmol/L (ref 22–32)
Calcium: 8.8 mg/dL — ABNORMAL LOW (ref 8.9–10.3)
Chloride: 101 mmol/L (ref 98–111)
Creatinine: 1.19 mg/dL (ref 0.61–1.24)
GFR, Estimated: >60 mL/min
Glucose, Bld: 271 mg/dL — ABNORMAL HIGH (ref 70–99)
Potassium: 3.4 mmol/L — ABNORMAL LOW (ref 3.5–5.1)
Sodium: 139 mmol/L (ref 135–145)
Total Bilirubin: 0.5 mg/dL (ref 0.0–1.2)
Total Protein: 6.3 g/dL — ABNORMAL LOW (ref 6.5–8.1)

## 2024-04-04 MED ORDER — DIPHENOXYLATE-ATROPINE 2.5-0.025 MG PO TABS: 1.0000 | ORAL_TABLET | Freq: Four times a day (QID) | ORAL | 0 refills | Status: AC | PRN

## 2024-04-04 NOTE — Progress Notes (Signed)
 " [facility]   [phone] [phone]   Clinic Follow up Note   Patient Care Team: [name], NP as PCP - General (Internal Medicine) [name], MD as PCP - Cardiology (Cardiology) [name], MD as Consulting Physician (Gastroenterology) [name], MD as Consulting Physician (Hematology) [name], OD (Optometry) [name], [name], RN as [facility] Care Management  Date of Service:  04/04/2024  CHIEF COMPLAINT: f/u of pancreatic cancer  CURRENT THERAPY:  Neoadjuvant chemotherapy FOLFIRINOX  Oncology History   Pancreatic cancer (HCC) Adenocarcinoma in tail of pancreas, cT3N0M0 -Patient was admitted in early December 2025 for GI bleeding, CT scan incidentally showed a 5.8 cm mass in the tail of pancreas, no evidence of nodal or distant metastasis.  CT scan showed possible invasion to gastric wall. -He underwent a EUS biopsy which confirmed adenocarcinoma.  Tumor invades splenic artery, no other vascular involvement. -Pt was seen by surgeon [name]. We recommend neoadjuvant chemo. He stared FOLFIRINOX on 03/14/2024.  He was admitted for neutropenic fever and diarrhea after first cycle chemo.  Assessment & Plan Malignant neoplasm of tail of pancreas Undergoing neoadjuvant chemotherapy for pancreatic adenocarcinoma. CA 19-9 remains elevated and is monitored monthly. Genetic testing for cancer-related genes was negative. Chemotherapy dosing is adjusted based on tolerance and laboratory parameters. He is tolerating treatment with resolution of prior cytopenias and diarrhea. Dose adjustments were discussed, emphasizing that modifications are based on hematologic tolerance. Advised to avoid international travel until completion of chemotherapy and recovery. - Continue neoadjuvant chemotherapy with dose adjustments per tolerance and laboratory results. - Increase oxaliplatin  dose slightly for next cycle; maintain reduced  erythritol dose. - Monitor CA 19-9 every 4 weeks. - Repeat imaging after cycle 5 or 6 to assess response. - Provided documentation for travel cancellation due to ongoing chemotherapy. - Advised to avoid international travel until post-chemotherapy recovery.  Chemotherapy-induced neutropenia Experienced neutropenia during prior cycles, resolved without intervention. Prophylactic measures are planned to prevent recurrence. Educated on infection prevention, including hand hygiene and avoiding injury from gardening or animals. - Administer Udenyca  (pegfilgrastim ) after chemotherapy to reduce neutropenia risk. - Monitor CBC with each cycle. - Reinforced infection prevention strategies.  Chemotherapy-induced diarrhea Significant diarrhea occurred post-chemotherapy, resolved prior to discharge. No infectious etiology identified. Currently has regular bowel movements with mild constipation. Educated on management of future episodes and supportive care. - Prescribed Lomotil  for use if Imodium  is insufficient. - Initiate Imodium  at onset of diarrhea, up to four times daily as needed. - Continue probiotics as started in hospital. - Educated on hydration and monitoring for dehydration.  Chemotherapy-induced thrombocytopenia Thrombocytopenia during prior cycles has resolved; platelet count is currently normal. Chemotherapy doses were previously reduced for thrombocytopenia. Ongoing dose adjustments are based on platelet count and tolerance. - Monitor platelet count with each chemotherapy cycle. - Continue chemotherapy dose adjustments based on platelet count and tolerance, including increased oxaliplatin  and reduced erythritol dosing.  Plan [facility] course reviewed again, he has recovered well. - Lab reviewed, adequate for treatment, will proceed second cycle chemo tomorrow with increased dose of oxaliplatin .  Will remain irinotecan  at the reduced dose as last cycle, due to his diarrhea. Will give  GCSF on day 3 - Will write a letter for him per his request - Follow-up in 2 weeks before cycle 3 chemo - Patient and his wife had many questions about his care plan, I answered the best I can.   SUMMARY OF ONCOLOGIC HISTORY: Oncology History  Pancreatic  cancer (HCC)  02/17/2024 Cancer Staging   Staging form: Exocrine Pancreas, AJCC 8th Edition - Clinical stage from 02/17/2024: Stage IIA (cT3, cN0, cM0) - Signed by [name], MD on 02/29/2024 Stage prefix: Initial diagnosis Total positive nodes: 0   02/22/2024 Initial Diagnosis   Pancreatic cancer (HCC)   03/06/2024 Genetic Testing   Negative genetic testing. The report date is March 06, 2024  The CancerNext-Expanded gene panel offered by [employer] and includes sequencing, rearrangement, and RNA analysis for the following 77 genes: AIP, ALK, APC, ATM, BAP1, BARD1, BMPR1A, BRCA1, BRCA2, BRIP1, CDC73, CDH1, CDK4, CDKN1B, CDKN2A, CEBPA, CHEK2, CTNNA1, DDX41, DICER1, ETV6, FH, FLCN, GATA2, LZTR1, MAX, MBD4, MEN1, MET, MLH1, MSH2, MSH3, MSH6, MUTYH, NF1, NF2, NTHL1, PALB2, PHOX2B, PMS2, POT1, PRKAR1A, PTCH1, PTEN, RAD51C, RAD51D, RB1, RET, RPS20, RUNX1, SDHA, SDHAF2, SDHB, SDHC, SDHD, SMAD4, SMARCA4, SMARCB1, SMARCE1, STK11, SUFU, TMEM127, TP53, TSC1, TSC2, VHL, and WT1 (sequencing and deletion/duplication); AXIN2, CTNNA1, DDX41, EGFR, HOXB13, KIT, MBD4, MITF, MSH3, PDGFRA, POLD1 and POLE (sequencing only); EPCAM and GREM1 (deletion/duplication only). RNA data is routinely analyzed for use in variant interpretation for all genes.    03/14/2024 -  Chemotherapy   Patient is on Treatment Plan : PANCREAS Modified FOLFIRINOX q14d x 4 cycles        Discussed the use of AI scribe software for clinical note transcription with the patient, who gave verbal consent to proceed.  History of Present Illness [name] [name] is a 74 year old male with pancreatic adenocarcinoma undergoing neoadjuvant chemotherapy who presents for oncology follow-up  after recent hospitalization for chemotherapy-induced diarrhea and neutropenia.  He received cycle 1 at reduced doses due to thrombocytopenia. About ten days later he developed severe diarrhea and fever requiring hospitalization. Infectious workup including C. difficile and viral panels was negative. Diarrhea resolved before discharge with probiotic therapy. Since then he has had regular bowel movements with mild constipation and no recurrent diarrhea, fever, nausea, or jaundice. He lost about eight pounds during the illness but is regaining weight and reports adequate oral intake. He used antiemetics only for several days after chemotherapy.  Hemoglobin is 11, platelets 172, and white blood cell count has normalized after hospitalization. He is awaiting current kidney and liver function tests. CA 19-9 has decreased from 772 at baseline to the 500s after the first chemotherapy cycle. He has no new symptoms of infection or bleeding.  He asked about using cold therapy during oxaliplatin  infusion to reduce neuropathy risk.  He canceled international travel due to chemotherapy and remains interested in maintaining activity with indoor exercise. Germline genetic testing for cancer-related genes was negative.     All other systems were reviewed with the patient and are negative.  MEDICAL HISTORY:  Past Medical History:  Diagnosis Date   Atrial fibrillation (HCC)    Cancer [facility])    Pancreas   Clotting disorder July 2024   Lower GI Bleed   Coronary artery disease    Diabetes mellitus without complication (HCC)    Dyslipidemia    Dysrhythmia    A.Fib-on Eliquis    ED (erectile dysfunction)    Hyperlipidemia    Hyperparathyroidism    Hypertension    Hypogonadism, male    Melena 02/09/2024   Sleep apnea     SURGICAL HISTORY: Past Surgical History:  Procedure Laterality Date   ESOPHAGOGASTRODUODENOSCOPY N/A 02/10/2024   Procedure: EGD (ESOPHAGOGASTRODUODENOSCOPY);  Surgeon: [name], MD;  Location: [facility];  Service: Gastroenterology;  Laterality: N/A;   ESOPHAGOGASTRODUODENOSCOPY N/A 02/17/2024  Procedure: EGD (ESOPHAGOGASTRODUODENOSCOPY);  Surgeon: Burnette Fallow, MD;  Location: [facility];  Service: Gastroenterology;  Laterality: N/A;   EUS N/A 02/17/2024   Procedure: ULTRASOUND, UPPER GI TRACT, ENDOSCOPIC;  Surgeon: Burnette Fallow, MD;  Location: [facility];  Service: Gastroenterology;  Laterality: N/A;  fine needle aspiration   FINE NEEDLE ASPIRATION BIOPSY  02/17/2024   Procedure: FINE NEEDLE ASPIRATION BIOPSY;  Surgeon: Burnette Fallow, MD;  Location: [facility];  Service: Gastroenterology;;   PARATHYROIDECTOMY     PORTACATH PLACEMENT Right 03/07/2024   Procedure: INSERTION, RIGHT TUNNELED CENTRAL VENOUS DEVICE, WITH PORT;  Surgeon: [name] Leonor CROME, MD;  Location: [facility];  Service: General;  Laterality: Right;  PORT-A-CATH INSERTION WITH ULTRASOUND GUIDANCE   SKIN BIOPSY Left 03/02/2019   squamous cell carcinoma in situ, hypertrophic, traumatized   SKIN BIOPSY Right 08/01/2021   residual squamous cell caarcinoma    I have reviewed the social history and family history with the patient and they are unchanged from previous note.  ALLERGIES:  is allergic to yellow jacket venom.  MEDICATIONS:  Current Outpatient Medications  Medication Sig Dispense Refill   Accu-Chek FastClix Lancets MISC 1 each by Does not apply route daily. 100 each 3   acetaminophen  (TYLENOL ) 325 MG tablet Take 2 tablets (650 mg total) by mouth every 6 (six) hours as needed for mild pain (pain score 1-3) (or Fever >/= 101).     amLODipine  (NORVASC ) 10 MG tablet TAKE 1 TABLET BY MOUTH EVERY DAY FOR BLOOD PRESSURE 90 tablet 2   apixaban  (ELIQUIS ) 5 MG TABS tablet Take 1 tablet (5 mg total) by mouth 2 (two) times daily.     cefdinir  (OMNICEF ) 300 MG capsule Take 1 capsule (300 mg total) by mouth 2 (two) times daily for 5 days. 10 capsule 0   dapagliflozin  propanediol (FARXIGA ) 10  MG TABS tablet TAKE 1 TABLET (10 MG TOTAL) BY MOUTH DAILY BEFORE BREAKFAST. FOR DIABETES. 90 tablet 1   dexamethasone  (DECADRON ) 4 MG tablet Take 2 tablets (8 mg total) by mouth daily. Take 2 tablets daily x 3 days starting the day after chemotherapy. Take with food. 30 tablet 1   diphenhydrAMINE -zinc  acetate (BENADRYL ) cream Apply topically 3 (three) times daily as needed for itching. 28.4 g 0   diphenoxylate -atropine  (LOMOTIL ) 2.5-0.025 MG tablet Take 1-2 tablets by mouth 4 (four) times daily as needed for diarrhea or loose stools. 30 tablet 0   EPINEPHrine  0.3 mg/0.3 mL IJ SOAJ injection Inject 0.3 mg into the muscle as needed for anaphylaxis. 1 each 0   gabapentin  (NEURONTIN ) 300 MG capsule Take 300 mg by mouth 1 day or 1 dose.     glucose blood (ACCU-CHEK GUIDE) test strip Use one daily to check blood sugar 100 strip 12   Iron , Ferrous Sulfate , 325 (65 Fe) MG TABS Take 65 mg by mouth daily with breakfast. 30 tablet 0   lidocaine -prilocaine  (EMLA ) cream Apply to affected area once 30 g 3   loperamide  (IMODIUM ) 2 MG capsule Take 2 tabs by mouth with first loose stool, then 1 tab with each additional loose stool as needed. Do not exceed 8 tabs in a 24-hour period 60 capsule 3   Multiple Vitamins-Minerals (MULTIVITAMIN WITH MINERALS) tablet Take 1 tablet by mouth daily with breakfast.     ondansetron  (ZOFRAN ) 8 MG tablet Take 1 tablet (8 mg total) by mouth every 8 (eight) hours as needed for nausea or vomiting. Start on the third day after chemotherapy 30 tablet 1   pantoprazole  (PROTONIX )  40 MG tablet Take 1 tablet (40 mg total) by mouth daily. 30 tablet 0   PRESCRIPTION MEDICATION CPAP- At bedtime     prochlorperazine  (COMPAZINE ) 10 MG tablet Take 1 tablet (10 mg total) by mouth every 6 (six) hours as needed for nausea or vomiting (Nausea or vomiting). 30 tablet 1   rosuvastatin  (CRESTOR ) 40 MG tablet Take 1 tablet (40 mg total) by mouth at bedtime.     Testosterone  20.25 MG/ACT (1.62%) GEL APPLY  3 PUMPS TO THE SHOULDER AND ARM DAILY 150 g 0   Glucosamine-Chondroit-Vit C-Mn (GLUCOSAMINE 1500 COMPLEX) CAPS Take 1 capsule by mouth daily.  (Patient not taking: Reported on 04/04/2024)     lactobacillus acidophilus & bulgar (LACTINEX) chewable tablet Chew 1 tablet by mouth 3 (three) times daily with meals for 7 days. (Patient not taking: Reported on 04/04/2024) 21 tablet 0   No current facility-administered medications for this visit.    PHYSICAL EXAMINATION: ECOG PERFORMANCE STATUS: 1 - Symptomatic but completely ambulatory  Vitals:   04/04/24 1337  BP: 130/69  Pulse: 66  Resp: 17  Temp: 97.6 F (36.4 C)  SpO2: 98%   Wt Readings from Last 3 Encounters:  04/04/24 216 lb 3.2 oz (98.1 kg)  03/28/24 218 lb 7.6 oz (99.1 kg)  03/21/24 215 lb 12.8 oz (97.9 kg)     GENERAL:alert, no distress and comfortable SKIN: skin color, texture, turgor are normal, no rashes or significant lesions EYES: normal, Conjunctiva are pink and non-injected, sclera clear NECK: supple, thyroid normal size, non-tender, without nodularity LYMPH:  no palpable lymphadenopathy in the cervical, axillary  LUNGS: clear to auscultation and percussion with normal breathing effort HEART: regular rate & rhythm and no murmurs and no lower extremity edema ABDOMEN:abdomen soft, non-tender and normal bowel sounds Musculoskeletal:no cyanosis of digits and no clubbing  NEURO: alert & oriented x 3 with fluent speech, no focal motor/sensory deficits  Physical Exam    LABORATORY DATA:  I have reviewed the data as listed    Latest Ref Rng & Units 04/04/2024    1:10 PM 03/30/2024    2:54 PM 03/29/2024    1:05 PM  CBC  WBC 4.0 - 10.5 K/uL 5.3  2.5  1.8   Hemoglobin 13.0 - 17.0 g/dL 88.9  89.0  89.7   Hematocrit 39.0 - 52.0 % 34.0  34.2  31.7   Platelets 150 - 400 K/uL 172  113  96         Latest Ref Rng & Units 04/04/2024    1:10 PM 03/30/2024    7:23 AM 03/29/2024    1:05 PM  CMP  Glucose 70 - 99 mg/dL 728  858   789   BUN 8 - 23 mg/dL 19  14  20    Creatinine 0.61 - 1.24 mg/dL 8.80  9.22  9.09   Sodium 135 - 145 mmol/L 139  139  138   Potassium 3.5 - 5.1 mmol/L 3.4  3.3  3.3   Chloride 98 - 111 mmol/L 101  108  108   CO2 22 - 32 mmol/L 28  21  22    Calcium  8.9 - 10.3 mg/dL 8.8  8.3  8.4   Total Protein 6.5 - 8.1 g/dL 6.3     Total Bilirubin 0.0 - 1.2 mg/dL 0.5     Alkaline Phos 38 - 126 U/L 57     AST 15 - 41 U/L 27     ALT 0 - 44 U/L 30  RADIOGRAPHIC STUDIES: I have personally reviewed the radiological images as listed and agreed with the findings in the report. No results found.    No orders of the defined types were placed in this encounter.  All questions were answered. The patient knows to call the clinic with any problems, questions or concerns. No barriers to learning was detected. The total time spent in the appointment was 40 minutes, including review of chart and various tests results, discussions about plan of care and coordination of care plan     Onita Mattock, MD 04/04/2024     "

## 2024-04-05 ENCOUNTER — Inpatient Hospital Stay

## 2024-04-05 ENCOUNTER — Encounter: Payer: Self-pay | Admitting: Hematology

## 2024-04-05 VITALS — BP 135/73 | HR 65 | Temp 97.9°F | Resp 16

## 2024-04-05 DIAGNOSIS — Z5111 Encounter for antineoplastic chemotherapy: Secondary | ICD-10-CM | POA: Diagnosis not present

## 2024-04-05 DIAGNOSIS — C252 Malignant neoplasm of tail of pancreas: Secondary | ICD-10-CM

## 2024-04-05 LAB — CANCER ANTIGEN 19-9: CA 19-9: 796 U/mL — ABNORMAL HIGH (ref 0–35)

## 2024-04-05 MED ORDER — DEXAMETHASONE SOD PHOSPHATE PF 10 MG/ML IJ SOLN
10.0000 mg | Freq: Once | INTRAMUSCULAR | Status: AC
  Administered 2024-04-05: 10 mg via INTRAVENOUS
  Filled 2024-04-05: qty 1

## 2024-04-05 MED ORDER — SODIUM CHLORIDE 0.9 % IV SOLN: INTRAVENOUS | Status: DC

## 2024-04-05 MED ORDER — ATROPINE SULFATE 1 MG/ML IV SOLN: 0.5000 mg | Freq: Once | INTRAVENOUS | Status: DC | PRN

## 2024-04-05 MED ORDER — SODIUM CHLORIDE 0.9 % IV SOLN
2400.0000 mg/m2 | INTRAVENOUS | Status: DC
  Administered 2024-04-05: 5000 mg via INTRAVENOUS
  Filled 2024-04-05: qty 100

## 2024-04-05 MED ORDER — DEXTROSE 5 % IV SOLN: INTRAVENOUS | Status: DC

## 2024-04-05 MED ORDER — OXALIPLATIN CHEMO INJECTION 100 MG/20ML
85.0000 mg/m2 | Freq: Once | INTRAVENOUS | Status: AC
  Administered 2024-04-05: 200 mg via INTRAVENOUS
  Filled 2024-04-05: qty 40

## 2024-04-05 MED ORDER — SODIUM CHLORIDE 0.9 % IV SOLN
400.0000 mg/m2 | Freq: Once | INTRAVENOUS | Status: AC
  Administered 2024-04-05: 904 mg via INTRAVENOUS
  Filled 2024-04-05: qty 45.2

## 2024-04-05 MED ORDER — APREPITANT 130 MG/18ML IV EMUL
130.0000 mg | Freq: Once | INTRAVENOUS | Status: AC
  Administered 2024-04-05: 130 mg via INTRAVENOUS
  Filled 2024-04-05: qty 18

## 2024-04-05 MED ORDER — PALONOSETRON HCL INJECTION 0.25 MG/5ML
0.2500 mg | Freq: Once | INTRAVENOUS | Status: AC
  Administered 2024-04-05: 0.25 mg via INTRAVENOUS
  Filled 2024-04-05: qty 5

## 2024-04-05 MED ORDER — SODIUM CHLORIDE 0.9 % IV SOLN
100.0000 mg/m2 | Freq: Once | INTRAVENOUS | Status: AC
  Administered 2024-04-05: 220 mg via INTRAVENOUS
  Filled 2024-04-05: qty 11

## 2024-04-05 NOTE — Patient Instructions (Signed)
 [facility] - A DEPT OF [facility]  Discharge Instructions: Thank you for choosing [facility] to provide your oncology and hematology care.   If you have a lab appointment with the Cancer Center, please go directly to the Cancer Center and check in at the registration area.   Wear comfortable clothing and clothing appropriate for easy access to any Portacath or PICC line.   We strive to give you quality time with your provider. You may need to reschedule your appointment if you arrive late (15 or more minutes).  Arriving late affects you and other patients whose appointments are after yours.  Also, if you miss three or more appointments without notifying the office, you may be dismissed from the clinic at the providers discretion.      For prescription refill requests, have your pharmacy contact our office and allow 72 hours for refills to be completed.    Today you received the following chemotherapy and/or immunotherapy agents Oxaliplatin , Leukovorin, Irinotecan , 5FU      To help prevent nausea and vomiting after your treatment, we encourage you to take your nausea medication as directed.  BELOW ARE SYMPTOMS THAT SHOULD BE REPORTED IMMEDIATELY: *FEVER GREATER THAN 100.4 F (38 C) OR HIGHER *CHILLS OR SWEATING *NAUSEA AND VOMITING THAT IS NOT CONTROLLED WITH YOUR NAUSEA MEDICATION *UNUSUAL SHORTNESS OF BREATH *UNUSUAL BRUISING OR BLEEDING *URINARY PROBLEMS (pain or burning when urinating, or frequent urination) *BOWEL PROBLEMS (unusual diarrhea, constipation, pain near the anus) TENDERNESS IN MOUTH AND THROAT WITH OR WITHOUT PRESENCE OF ULCERS (sore throat, sores in mouth, or a toothache) UNUSUAL RASH, SWELLING OR PAIN  UNUSUAL VAGINAL DISCHARGE OR ITCHING   Items with * indicate a potential emergency and should be followed up as soon as possible or go to the Emergency Department if any problems should occur.  Please show the  CHEMOTHERAPY ALERT CARD or IMMUNOTHERAPY ALERT CARD at check-in to the Emergency Department and triage nurse.  Should you have questions after your visit or need to cancel or reschedule your appointment, please contact [facility] - A DEPT OF [facility]  Dept: [phone]  and follow the prompts.  Office hours are 8:00 a.m. to 4:30 p.m. Monday - Friday. Please note that voicemails left after 4:00 p.m. may not be returned until the following business day.  We are closed weekends and major holidays. You have access to a nurse at all times for urgent questions. Please call the main number to the clinic Dept: [phone] and follow the prompts.   For any non-urgent questions, you may also contact your provider using MyChart. We now offer e-Visits for anyone 8 and older to request care online for non-urgent symptoms. For details visit [email or web address].   Also download the MyChart app! Go to the app store, search MyChart, open the app, select [place], and log in with your MyChart username and password.

## 2024-04-05 NOTE — Progress Notes (Signed)
 Per [name], okay to proceed with full dose oxaliplatin  (85 mg/m2) today and continue  reduced irinotecan  dose at 100 mg/m2. Irinotecan  dose not adjusted for future cycles as MD is anticipating increasing the dose.  Harlene Nasuti, PharmD Oncology Infusion Pharmacist 04/05/2024 8:00 AM

## 2024-04-06 ENCOUNTER — Other Ambulatory Visit: Payer: Self-pay | Admitting: Nurse Practitioner

## 2024-04-07 ENCOUNTER — Inpatient Hospital Stay

## 2024-04-07 ENCOUNTER — Inpatient Hospital Stay: Admitting: Hematology

## 2024-04-07 VITALS — BP 149/72 | HR 54 | Resp 17

## 2024-04-07 DIAGNOSIS — Z5111 Encounter for antineoplastic chemotherapy: Secondary | ICD-10-CM | POA: Diagnosis not present

## 2024-04-07 DIAGNOSIS — C252 Malignant neoplasm of tail of pancreas: Secondary | ICD-10-CM

## 2024-04-07 MED ORDER — PEGFILGRASTIM-CBQV 6 MG/0.6ML ~~LOC~~ SOSY
6.0000 mg | PREFILLED_SYRINGE | Freq: Once | SUBCUTANEOUS | Status: AC
  Administered 2024-04-07: 6 mg via SUBCUTANEOUS
  Filled 2024-04-07: qty 0.6

## 2024-04-09 ENCOUNTER — Encounter: Payer: Self-pay | Admitting: Hematology

## 2024-04-09 ENCOUNTER — Encounter (HOSPITAL_COMMUNITY): Payer: Self-pay | Admitting: Hematology

## 2024-04-11 ENCOUNTER — Inpatient Hospital Stay

## 2024-04-11 ENCOUNTER — Inpatient Hospital Stay: Admitting: Hematology

## 2024-04-11 NOTE — Progress Notes (Signed)
 Attempted to contact pt regarding after hours message about a cat scratch. Left pt a message to call the office if the wound looks infected. Pt will have labs and provider visit prior to next tx and told pt it would be looked at then if he doesn't call back before then.

## 2024-04-13 ENCOUNTER — Inpatient Hospital Stay

## 2024-04-15 ENCOUNTER — Observation Stay (HOSPITAL_COMMUNITY)
Admission: EM | Admit: 2024-04-15 | Discharge: 2024-04-17 | Disposition: A | Source: Ambulatory Visit | Attending: Internal Medicine | Admitting: Internal Medicine

## 2024-04-15 ENCOUNTER — Other Ambulatory Visit: Payer: Self-pay

## 2024-04-15 ENCOUNTER — Emergency Department (HOSPITAL_COMMUNITY)

## 2024-04-15 ENCOUNTER — Encounter (HOSPITAL_COMMUNITY): Payer: Self-pay

## 2024-04-15 DIAGNOSIS — R509 Fever, unspecified: Secondary | ICD-10-CM | POA: Diagnosis present

## 2024-04-15 DIAGNOSIS — I5032 Chronic diastolic (congestive) heart failure: Secondary | ICD-10-CM | POA: Diagnosis present

## 2024-04-15 DIAGNOSIS — E1169 Type 2 diabetes mellitus with other specified complication: Secondary | ICD-10-CM

## 2024-04-15 DIAGNOSIS — Z7901 Long term (current) use of anticoagulants: Secondary | ICD-10-CM | POA: Insufficient documentation

## 2024-04-15 DIAGNOSIS — C259 Malignant neoplasm of pancreas, unspecified: Secondary | ICD-10-CM | POA: Diagnosis present

## 2024-04-15 DIAGNOSIS — Z79899 Other long term (current) drug therapy: Secondary | ICD-10-CM | POA: Insufficient documentation

## 2024-04-15 DIAGNOSIS — E291 Testicular hypofunction: Secondary | ICD-10-CM

## 2024-04-15 DIAGNOSIS — F10920 Alcohol use, unspecified with intoxication, uncomplicated: Secondary | ICD-10-CM | POA: Insufficient documentation

## 2024-04-15 DIAGNOSIS — E230 Hypopituitarism: Secondary | ICD-10-CM | POA: Insufficient documentation

## 2024-04-15 DIAGNOSIS — D709 Neutropenia, unspecified: Principal | ICD-10-CM

## 2024-04-15 DIAGNOSIS — E1159 Type 2 diabetes mellitus with other circulatory complications: Secondary | ICD-10-CM

## 2024-04-15 DIAGNOSIS — E1142 Type 2 diabetes mellitus with diabetic polyneuropathy: Secondary | ICD-10-CM | POA: Insufficient documentation

## 2024-04-15 DIAGNOSIS — K521 Toxic gastroenteritis and colitis: Principal | ICD-10-CM

## 2024-04-15 DIAGNOSIS — E785 Hyperlipidemia, unspecified: Secondary | ICD-10-CM

## 2024-04-15 DIAGNOSIS — G4733 Obstructive sleep apnea (adult) (pediatric): Secondary | ICD-10-CM | POA: Insufficient documentation

## 2024-04-15 DIAGNOSIS — K219 Gastro-esophageal reflux disease without esophagitis: Secondary | ICD-10-CM | POA: Insufficient documentation

## 2024-04-15 DIAGNOSIS — I480 Paroxysmal atrial fibrillation: Secondary | ICD-10-CM | POA: Diagnosis present

## 2024-04-15 DIAGNOSIS — D61810 Antineoplastic chemotherapy induced pancytopenia: Secondary | ICD-10-CM | POA: Insufficient documentation

## 2024-04-15 DIAGNOSIS — E876 Hypokalemia: Secondary | ICD-10-CM | POA: Diagnosis present

## 2024-04-15 DIAGNOSIS — T6591XA Toxic effect of unspecified substance, accidental (unintentional), initial encounter: Secondary | ICD-10-CM | POA: Insufficient documentation

## 2024-04-15 DIAGNOSIS — T451X5A Adverse effect of antineoplastic and immunosuppressive drugs, initial encounter: Secondary | ICD-10-CM

## 2024-04-15 DIAGNOSIS — I110 Hypertensive heart disease with heart failure: Secondary | ICD-10-CM | POA: Insufficient documentation

## 2024-04-15 LAB — COMPREHENSIVE METABOLIC PANEL WITH GFR
ALT: 9 U/L (ref 0–44)
AST: 11 U/L — ABNORMAL LOW (ref 15–41)
Albumin: 3.6 g/dL (ref 3.5–5.0)
Alkaline Phosphatase: 76 U/L (ref 38–126)
Anion gap: 12 (ref 5–15)
BUN: 14 mg/dL (ref 8–23)
CO2: 24 mmol/L (ref 22–32)
Calcium: 8.6 mg/dL — ABNORMAL LOW (ref 8.9–10.3)
Chloride: 105 mmol/L (ref 98–111)
Creatinine, Ser: 1.05 mg/dL (ref 0.61–1.24)
GFR, Estimated: >60 mL/min
Glucose, Bld: 206 mg/dL — ABNORMAL HIGH (ref 70–99)
Potassium: 2.6 mmol/L — CL (ref 3.5–5.1)
Sodium: 141 mmol/L (ref 135–145)
Total Bilirubin: 0.7 mg/dL (ref 0.0–1.2)
Total Protein: 6.1 g/dL — ABNORMAL LOW (ref 6.5–8.1)

## 2024-04-15 LAB — CBC WITH DIFFERENTIAL/PLATELET

## 2024-04-15 LAB — RESP PANEL BY RT-PCR (RSV, FLU A&B, COVID)  RVPGX2
Influenza A by PCR: NEGATIVE
Influenza B by PCR: NEGATIVE
Resp Syncytial Virus by PCR: NEGATIVE
SARS Coronavirus 2 by RT PCR: NEGATIVE

## 2024-04-15 LAB — I-STAT CG4 LACTIC ACID, ED: Lactic Acid, Venous: 0.6 mmol/L (ref 0.5–1.9)

## 2024-04-15 MED ORDER — POTASSIUM CHLORIDE 10 MEQ/100ML IV SOLN
10.0000 meq | Freq: Once | INTRAVENOUS | Status: AC
  Administered 2024-04-15: 10 meq via INTRAVENOUS
  Filled 2024-04-15: qty 100

## 2024-04-15 MED ORDER — SODIUM CHLORIDE 0.9 % IV BOLUS
1000.0000 mL | Freq: Once | INTRAVENOUS | Status: AC
  Administered 2024-04-15: 1000 mL via INTRAVENOUS

## 2024-04-15 MED ORDER — ACETAMINOPHEN 325 MG PO TABS
650.0000 mg | ORAL_TABLET | Freq: Once | ORAL | Status: AC
  Administered 2024-04-15: 650 mg via ORAL
  Filled 2024-04-15: qty 2

## 2024-04-15 MED ORDER — POTASSIUM CHLORIDE CRYS ER 20 MEQ PO TBCR
40.0000 meq | EXTENDED_RELEASE_TABLET | Freq: Once | ORAL | Status: AC
  Administered 2024-04-15: 40 meq via ORAL
  Filled 2024-04-15: qty 2

## 2024-04-15 NOTE — ED Provider Notes (Signed)
 " [place] EMERGENCY DEPARTMENT AT [facility] Provider Note   CSN: [id number] Arrival date & time: 04/15/24  2109     Patient presents with: Fever (CA pt on chemo)   [name] [name] is a 74 y.o. male.  {Add pertinent medical, surgical, social history, OB history to YEP:[id number]} The history is provided by the patient and medical records.  Fever  74 year old male with known pancreatic cancer undergoing chemotherapy under care of [name], presenting to the ED with fever.  Began today abruptly around 6 PM.  He states he has a little bit of diarrhea but has not had any cough, nausea, vomiting, or other URI symptoms.  Last chemotherapy was 04/05/2024.  States after her last chemotherapy he developed a fever about 10 days later as well.  He was admitted at that time due to neutropenic fever but cultures were all negative.  He does feel little dehydrated currently but feels like he has been eating and drinking well.  Prior to Admission medications  Medication Sig Start Date End Date Taking? Authorizing Provider  Accu-Chek FastClix Lancets MISC 1 each by Does not apply route daily. 10/16/20   Joyce Norleen BROCKS, MD  acetaminophen  (TYLENOL ) 325 MG tablet Take 2 tablets (650 mg total) by mouth every 6 (six) hours as needed for mild pain (pain score 1-3) (or Fever >/= 101). 03/30/24   Arlice Reichert, MD  amLODipine  (NORVASC ) 10 MG tablet TAKE 1 TABLET BY MOUTH EVERY DAY FOR BLOOD PRESSURE 03/24/24   Clark, Katherine K, NP  apixaban  (ELIQUIS ) 5 MG TABS tablet Take 1 tablet (5 mg total) by mouth 2 (two) times daily. 02/19/24   Burnette Fallow, MD  dapagliflozin  propanediol (FARXIGA ) 10 MG TABS tablet TAKE 1 TABLET (10 MG TOTAL) BY MOUTH DAILY BEFORE BREAKFAST. FOR DIABETES. 03/11/24   [name], NP  dexamethasone  (DECADRON ) 4 MG tablet Take 2 tablets (8 mg total) by mouth daily. Take 2 tablets daily x 3 days starting the day after chemotherapy. Take with food. 03/07/24   [name], MD   diphenhydrAMINE -zinc  acetate (BENADRYL ) cream Apply topically 3 (three) times daily as needed for itching. 03/30/24   Arlice Reichert, MD  diphenoxylate -atropine  (LOMOTIL ) 2.5-0.025 MG tablet Take 1-2 tablets by mouth 4 (four) times daily as needed for diarrhea or loose stools. 04/04/24   [name], MD  EPINEPHrine  0.3 mg/0.3 mL IJ SOAJ injection Inject 0.3 mg into the muscle as needed for anaphylaxis. 03/02/24   Clark, Katherine K, NP  gabapentin  (NEURONTIN ) 300 MG capsule Take 300 mg by mouth 1 day or 1 dose. 06/19/23   [provider]  Glucosamine-Chondroit-Vit C-Mn (GLUCOSAMINE 1500 COMPLEX) CAPS Take 1 capsule by mouth daily.  Patient not taking: Reported on 04/04/2024    [provider]  glucose blood (ACCU-CHEK GUIDE) test strip Use one daily to check blood sugar 10/16/20   Joyce Norleen BROCKS, MD  Iron , Ferrous Sulfate , 325 (65 Fe) MG TABS Take 65 mg by mouth daily with breakfast. 03/14/24 04/13/24  Hanford Powell BRAVO, NP  lidocaine -prilocaine  (EMLA ) cream Apply to affected area once 03/07/24   [name], MD  loperamide  (IMODIUM ) 2 MG capsule Take 2 tabs by mouth with first loose stool, then 1 tab with each additional loose stool as needed. Do not exceed 8 tabs in a 24-hour period 03/07/24   [name], MD  Multiple Vitamins-Minerals (MULTIVITAMIN WITH MINERALS) tablet Take 1 tablet by mouth daily with breakfast.    [provider]  ondansetron  (ZOFRAN )  8 MG tablet Take 1 tablet (8 mg total) by mouth every 8 (eight) hours as needed for nausea or vomiting. Start on the third day after chemotherapy 03/07/24   [name], MD  pantoprazole  (PROTONIX ) 40 MG tablet TAKE 1 TABLET BY MOUTH EVERY DAY 04/06/24   Boscia, Heather E, NP  PRESCRIPTION MEDICATION CPAP- At bedtime    [provider]  prochlorperazine  (COMPAZINE ) 10 MG tablet Take 1 tablet (10 mg total) by mouth every 6 (six) hours as needed for nausea or vomiting (Nausea or vomiting). 03/07/24   [name], MD  rosuvastatin   (CRESTOR ) 40 MG tablet Take 1 tablet (40 mg total) by mouth at bedtime. 02/10/24   Cheryle Page, MD  Testosterone  20.25 MG/ACT (1.62%) GEL APPLY 3 PUMPS TO THE SHOULDER AND ARM DAILY 03/16/24   [name], NP    Allergies: Yellow jacket venom    Review of Systems  Constitutional:  Positive for fever.    Updated Vital Signs BP (!) 154/77 (BP Location: Left Arm)   Pulse (!) 103   Temp 99.5 F (37.5 C) (Oral)   Resp 15   Ht 5' 11 (1.803 m)   Wt 94.8 kg   SpO2 97%   BMI 29.15 kg/m   Physical Exam Vitals and nursing note reviewed.  Constitutional:      Appearance: He is well-developed.  HENT:     Head: Normocephalic and atraumatic.  Eyes:     Conjunctiva/sclera: Conjunctivae normal.     Pupils: Pupils are equal, round, and reactive to light.  Cardiovascular:     Rate and Rhythm: Normal rate and regular rhythm.     Heart sounds: Normal heart sounds.  Pulmonary:     Effort: Pulmonary effort is normal.     Breath sounds: Normal breath sounds.  Chest:     Comments: Port right chest wall, clean without signs of infection Abdominal:     General: Bowel sounds are normal.     Palpations: Abdomen is soft.  Musculoskeletal:        General: Normal range of motion.     Cervical back: Normal range of motion.  Skin:    General: Skin is warm and dry.  Neurological:     Mental Status: He is alert and oriented to person, place, and time.     (all labs ordered are listed, but only abnormal results are displayed) Labs Reviewed  CULTURE, BLOOD (ROUTINE X 2)  CULTURE, BLOOD (ROUTINE X 2)  RESP PANEL BY RT-PCR (RSV, FLU A&B, COVID)  RVPGX2  COMPREHENSIVE METABOLIC PANEL WITH GFR  CBC WITH DIFFERENTIAL/PLATELET  URINALYSIS, W/ REFLEX TO CULTURE (INFECTION SUSPECTED)  I-STAT CG4 LACTIC ACID, ED  I-STAT CG4 LACTIC ACID, ED    EKG: None  Radiology: No results found.  {Document cardiac monitor, telemetry assessment procedure when appropriate:32947} Procedures    Medications Ordered in the ED  sodium chloride  0.9 % bolus 1,000 mL (has no administration in time range)  acetaminophen  (TYLENOL ) tablet 650 mg (has no administration in time range)      {Click here for ABCD2, HEART and other calculators REFRESH Note before signing:1}                              Medical Decision Making Amount and/or Complexity of Data Reviewed Labs: ordered. Radiology: ordered.  Risk OTC drugs.   ***  {Document critical care time when appropriate  Document review of labs and clinical  decision tools ie CHADS2VASC2, etc  Document your independent review of radiology images and any outside records  Document your discussion with family members, caretakers and with consultants  Document social determinants of health affecting pt's care  Document your decision making why or why not admission, treatments were needed:32947:::1}   Final diagnoses:  None    ED Discharge Orders     None        "

## 2024-04-15 NOTE — ED Notes (Signed)
 Pt not able to void at this moment. Urinal left on bedside rail and UA cup left on bedside table.

## 2024-04-15 NOTE — ED Triage Notes (Signed)
 Pt has a hx of pancreatic CA and is currently receiving chemo treatments. His last treatment was 1/20. Tonight he spiked a fever of 101.1 and has been having diarrhea today.

## 2024-04-15 NOTE — ED Notes (Signed)
 Lab adding on Magnesium 

## 2024-04-15 NOTE — Progress Notes (Unsigned)
 [facility] OFFICE PROGRESS NOTE  [name] Comer POUR, NP [address] [name] [name]  DIAGNOSIS:  f/u of pancreatic cancer   Oncology History  Pancreatic cancer [facility])  02/17/2024 Cancer Staging   Staging form: Exocrine Pancreas, AJCC 8th Edition - Clinical stage from 02/17/2024: Stage IIA (cT3, cN0, cM0) - Signed by [name], MD on 02/29/2024 Stage prefix: Initial diagnosis Total positive nodes: 0   02/22/2024 Initial Diagnosis   Pancreatic cancer (HCC)   03/06/2024 Genetic Testing   Negative genetic testing. The report date is March 06, 2024  The CancerNext-Expanded gene panel offered by [employer] and includes sequencing, rearrangement, and RNA analysis for the following 77 genes: AIP, ALK, APC, ATM, BAP1, BARD1, BMPR1A, BRCA1, BRCA2, BRIP1, CDC73, CDH1, CDK4, CDKN1B, CDKN2A, CEBPA, CHEK2, CTNNA1, DDX41, DICER1, ETV6, FH, FLCN, GATA2, LZTR1, MAX, MBD4, MEN1, MET, MLH1, MSH2, MSH3, MSH6, MUTYH, NF1, NF2, NTHL1, PALB2, PHOX2B, PMS2, POT1, PRKAR1A, PTCH1, PTEN, RAD51C, RAD51D, RB1, RET, RPS20, RUNX1, SDHA, SDHAF2, SDHB, SDHC, SDHD, SMAD4, SMARCA4, SMARCB1, SMARCE1, STK11, SUFU, TMEM127, TP53, TSC1, TSC2, VHL, and WT1 (sequencing and deletion/duplication); AXIN2, CTNNA1, DDX41, EGFR, HOXB13, KIT, MBD4, MITF, MSH3, PDGFRA, POLD1 and POLE (sequencing only); EPCAM and GREM1 (deletion/duplication only). RNA data is routinely analyzed for use in variant interpretation for all genes.    03/14/2024 -  Chemotherapy   Patient is on Treatment Plan : PANCREAS Modified FOLFIRINOX q14d x 4 cycles       CURRENT THERAPY: Neoadjuvant chemotherapy FOLFIRINOX. Today is day 1 cycle #3.   INTERVAL HISTORY: [name] [name] 74 y.o. male returns clinic today for follow-up visit.  The patient was last seen in the clinic by [name] 04/04/2024.  Patient is currently undergoing chemotherapy.  In earlier January the patient was hospitalized for diarrhea and  neutropenia.       MEDICAL HISTORY: Past Medical History:  Diagnosis Date   Atrial fibrillation (HCC)    Cancer [facility])    Pancreas   Clotting disorder July 2024   Lower GI Bleed   Coronary artery disease    Diabetes mellitus without complication (HCC)    Dyslipidemia    Dysrhythmia    A.Fib-on Eliquis    ED (erectile dysfunction)    Hyperlipidemia    Hyperparathyroidism    Hypertension    Hypogonadism, male    Melena 02/09/2024   Sleep apnea     ALLERGIES:  is allergic to yellow jacket venom.  MEDICATIONS:  Current Outpatient Medications  Medication Sig Dispense Refill   Accu-Chek FastClix Lancets MISC 1 each by Does not apply route daily. 100 each 3   acetaminophen  (TYLENOL ) 325 MG tablet Take 2 tablets (650 mg total) by mouth every 6 (six) hours as needed for mild pain (pain score 1-3) (or Fever >/= 101).     amLODipine  (NORVASC ) 10 MG tablet TAKE 1 TABLET BY MOUTH EVERY DAY FOR BLOOD PRESSURE 90 tablet 2   apixaban  (ELIQUIS ) 5 MG TABS tablet Take 1 tablet (5 mg total) by mouth 2 (two) times daily.     dapagliflozin  propanediol (FARXIGA ) 10 MG TABS tablet TAKE 1 TABLET (10 MG TOTAL) BY MOUTH DAILY BEFORE BREAKFAST. FOR DIABETES. 90 tablet 1   dexamethasone  (DECADRON ) 4 MG tablet Take 2 tablets (8 mg total) by mouth daily. Take 2 tablets daily x 3 days starting the day after chemotherapy. Take with food. 30 tablet 1   diphenhydrAMINE -zinc  acetate (BENADRYL ) cream Apply topically 3 (three) times daily as needed for itching. 28.4  g 0   diphenoxylate -atropine  (LOMOTIL ) 2.5-0.025 MG tablet Take 1-2 tablets by mouth 4 (four) times daily as needed for diarrhea or loose stools. 30 tablet 0   EPINEPHrine  0.3 mg/0.3 mL IJ SOAJ injection Inject 0.3 mg into the muscle as needed for anaphylaxis. 1 each 0   gabapentin  (NEURONTIN ) 300 MG capsule Take 300 mg by mouth 1 day or 1 dose.     Glucosamine-Chondroit-Vit C-Mn (GLUCOSAMINE 1500 COMPLEX) CAPS Take 1 capsule by mouth daily.   (Patient not taking: Reported on 04/04/2024)     glucose blood (ACCU-CHEK GUIDE) test strip Use one daily to check blood sugar 100 strip 12   Iron , Ferrous Sulfate , 325 (65 Fe) MG TABS Take 65 mg by mouth daily with breakfast. 30 tablet 0   lidocaine -prilocaine  (EMLA ) cream Apply to affected area once 30 g 3   loperamide  (IMODIUM ) 2 MG capsule Take 2 tabs by mouth with first loose stool, then 1 tab with each additional loose stool as needed. Do not exceed 8 tabs in a 24-hour period 60 capsule 3   Multiple Vitamins-Minerals (MULTIVITAMIN WITH MINERALS) tablet Take 1 tablet by mouth daily with breakfast.     ondansetron  (ZOFRAN ) 8 MG tablet Take 1 tablet (8 mg total) by mouth every 8 (eight) hours as needed for nausea or vomiting. Start on the third day after chemotherapy 30 tablet 1   pantoprazole  (PROTONIX ) 40 MG tablet TAKE 1 TABLET BY MOUTH EVERY DAY 90 tablet 1   PRESCRIPTION MEDICATION CPAP- At bedtime     prochlorperazine  (COMPAZINE ) 10 MG tablet Take 1 tablet (10 mg total) by mouth every 6 (six) hours as needed for nausea or vomiting (Nausea or vomiting). 30 tablet 1   rosuvastatin  (CRESTOR ) 40 MG tablet Take 1 tablet (40 mg total) by mouth at bedtime.     Testosterone  20.25 MG/ACT (1.62%) GEL APPLY 3 PUMPS TO THE SHOULDER AND ARM DAILY 150 g 0   No current facility-administered medications for this visit.    SURGICAL HISTORY:  Past Surgical History:  Procedure Laterality Date   ESOPHAGOGASTRODUODENOSCOPY N/A 02/10/2024   Procedure: EGD (ESOPHAGOGASTRODUODENOSCOPY);  Surgeon: [name], MD;  Location: [facility];  Service: Gastroenterology;  Laterality: N/A;   ESOPHAGOGASTRODUODENOSCOPY N/A 02/17/2024   Procedure: EGD (ESOPHAGOGASTRODUODENOSCOPY);  Surgeon: Burnette Fallow, MD;  Location: [facility];  Service: Gastroenterology;  Laterality: N/A;   EUS N/A 02/17/2024   Procedure: ULTRASOUND, UPPER GI TRACT, ENDOSCOPIC;  Surgeon: Burnette Fallow, MD;  Location: [facility];   Service: Gastroenterology;  Laterality: N/A;  fine needle aspiration   FINE NEEDLE ASPIRATION BIOPSY  02/17/2024   Procedure: FINE NEEDLE ASPIRATION BIOPSY;  Surgeon: Burnette Fallow, MD;  Location: [facility];  Service: Gastroenterology;;   PARATHYROIDECTOMY     PORTACATH PLACEMENT Right 03/07/2024   Procedure: INSERTION, RIGHT TUNNELED CENTRAL VENOUS DEVICE, WITH PORT;  Surgeon: [name] Leonor CROME, MD;  Location: [facility];  Service: General;  Laterality: Right;  PORT-A-CATH INSERTION WITH ULTRASOUND GUIDANCE   SKIN BIOPSY Left 03/02/2019   squamous cell carcinoma in situ, hypertrophic, traumatized   SKIN BIOPSY Right 08/01/2021   residual squamous cell caarcinoma    REVIEW OF SYSTEMS:   Review of Systems  Constitutional: Negative for appetite change, chills, fatigue, fever and unexpected weight change.  HENT:   Negative for mouth sores, nosebleeds, sore throat and trouble swallowing.   Eyes: Negative for eye problems and icterus.  Respiratory: Negative for cough, hemoptysis, shortness of breath and wheezing.   Cardiovascular: Negative for chest pain and  leg swelling.  Gastrointestinal: Negative for abdominal pain, constipation, diarrhea, nausea and vomiting.  Genitourinary: Negative for bladder incontinence, difficulty urinating, dysuria, frequency and hematuria.   Musculoskeletal: Negative for back pain, gait problem, neck pain and neck stiffness.  Skin: Negative for itching and rash.  Neurological: Negative for dizziness, extremity weakness, gait problem, headaches, light-headedness and seizures.  Hematological: Negative for adenopathy. Does not bruise/bleed easily.  Psychiatric/Behavioral: Negative for confusion, depression and sleep disturbance. The patient is not nervous/anxious.     PHYSICAL EXAMINATION:  There were no vitals taken for this visit.  ECOG PERFORMANCE STATUS: {CHL ONC ECOG [id number]  Physical Exam  Constitutional: Oriented to person, place, and time and  well-developed, well-nourished, and in no distress. No distress.  HENT:  Head: Normocephalic and atraumatic.  Mouth/Throat: Oropharynx is clear and moist. No oropharyngeal exudate.  Eyes: Conjunctivae are normal. Right eye exhibits no discharge. Left eye exhibits no discharge. No scleral icterus.  Neck: Normal range of motion. Neck supple.  Cardiovascular: Normal rate, regular rhythm, normal heart sounds and intact distal pulses.   Pulmonary/Chest: Effort normal and breath sounds normal. No respiratory distress. No wheezes. No rales.  Abdominal: Soft. Bowel sounds are normal. Exhibits no distension and no mass. There is no tenderness.  Musculoskeletal: Normal range of motion. Exhibits no edema.  Lymphadenopathy:    No cervical adenopathy.  Neurological: Alert and oriented to person, place, and time. Exhibits normal muscle tone. Gait normal. Coordination normal.  Skin: Skin is warm and dry. No rash noted. Not diaphoretic. No erythema. No pallor.  Psychiatric: Mood, memory and judgment normal.  Vitals reviewed.  LABORATORY DATA: Lab Results  Component Value Date   WBC 5.3 04/04/2024   HGB 11.0 (L) 04/04/2024   HCT 34.0 (L) 04/04/2024   MCV 84.2 04/04/2024   PLT 172 04/04/2024      Chemistry      Component Value Date/Time   NA 139 04/04/2024 1310   NA 142 02/04/2023 1140   K 3.4 (L) 04/04/2024 1310   CL 101 04/04/2024 1310   CO2 28 04/04/2024 1310   BUN 19 04/04/2024 1310   BUN 15 02/04/2023 1140   CREATININE 1.19 04/04/2024 1310   CREATININE 1.16 04/17/2023 1622      Component Value Date/Time   CALCIUM  8.8 (L) 04/04/2024 1310   ALKPHOS 57 04/04/2024 1310   AST 27 04/04/2024 1310   ALT 30 04/04/2024 1310   BILITOT 0.5 04/04/2024 1310       RADIOGRAPHIC STUDIES:  DG Chest 2 View Result Date: 03/27/2024 EXAM: 2 VIEW(S) XRAY OF THE CHEST 03/27/2024 12:13:00 AM COMPARISON: 03/07/2024 CLINICAL HISTORY: Fever FINDINGS: LINES, TUBES AND DEVICES: Right chest Port-A-Cath in  place with tip overlying the expected region of the mid superior vena cava. LUNGS AND PLEURA: No focal pulmonary opacity. Blunting of bilateral costophrenic angles. No pneumothorax. HEART AND MEDIASTINUM: No acute abnormality of the cardiac and mediastinal silhouettes. Aortic atherosclerosis. BONES AND SOFT TISSUES: No acute osseous abnormality. IMPRESSION: 1. Blunting of bilateral costophrenic angles. Possible trace pleural effusion versus scarring. 2. Right chest Port-A-Cath with tip overlying the mid superior vena cava. Electronically signed by: Morgane Naveau MD MD 03/27/2024 12:16 AM EST RP Workstation: [id number]     ASSESSMENT/PLAN:  No problem-specific Assessment & Plan notes found for this encounter.   No orders of the defined types were placed in this encounter.    I spent {CHL ONC TIME VISIT - DTPQU:[id number]} counseling the patient face to face. The total time spent in  the appointment was {CHL ONC TIME VISIT - DTPQU:[id number]}.  Pretty Weltman L Hooria Gasparini, PA-C 04/15/24

## 2024-04-16 DIAGNOSIS — R509 Fever, unspecified: Secondary | ICD-10-CM | POA: Diagnosis present

## 2024-04-16 DIAGNOSIS — T451X5A Adverse effect of antineoplastic and immunosuppressive drugs, initial encounter: Secondary | ICD-10-CM

## 2024-04-16 DIAGNOSIS — E876 Hypokalemia: Secondary | ICD-10-CM

## 2024-04-16 DIAGNOSIS — E785 Hyperlipidemia, unspecified: Secondary | ICD-10-CM

## 2024-04-16 DIAGNOSIS — E1169 Type 2 diabetes mellitus with other specified complication: Secondary | ICD-10-CM

## 2024-04-16 DIAGNOSIS — K521 Toxic gastroenteritis and colitis: Principal | ICD-10-CM

## 2024-04-16 DIAGNOSIS — C252 Malignant neoplasm of tail of pancreas: Secondary | ICD-10-CM

## 2024-04-16 DIAGNOSIS — I5032 Chronic diastolic (congestive) heart failure: Secondary | ICD-10-CM

## 2024-04-16 LAB — CBC WITH DIFFERENTIAL/PLATELET
Basophils Relative: 0 10*3/uL (ref 0.0–0.1)
Eosinophils Absolute: 1 10*3/uL (ref 0.0–0.5)
Eosinophils Relative: 0.1 10*3/uL (ref 0.0–0.5)
HCT: 36.9 % — ABNORMAL LOW (ref 39.0–52.0)
Hemoglobin: 11.5 g/dL — ABNORMAL LOW (ref 13.0–17.0)
Lymphocytes Relative: 15 %
Lymphs Abs: 0.5 10*3/uL — AB (ref 0.7–4.0)
MCH: 26.6 pg (ref 26.0–34.0)
MCHC: 31.2 g/dL (ref 30.0–36.0)
MCV: 85.4 fL (ref 80.0–100.0)
Monocytes Absolute: 3 10*3/uL (ref 0.1–1.0)
Monocytes Relative: 0.7 10*3/uL (ref 0.1–1.0)
Monocytes Relative: 20 10*3/uL (ref 0.7–4.0)
Neutro Abs: 1.9 10*3/uL (ref 1.7–7.7)
Neutrophils Relative %: 54 %
Platelets: 86 10*3/uL — ABNORMAL LOW (ref 150–400)
RBC Morphology: 0.25 10*3/uL — AB (ref 0.00–0.07)
RBC: 4.32 MIL/uL (ref 4.22–5.81)
RDW: 14.1 % (ref 11.5–15.5)
WBC Morphology: 7
WBC: 3.5 10*3/uL — ABNORMAL LOW (ref 4.0–10.5)
nRBC: 0.9 % — ABNORMAL HIGH (ref 0.0–0.2)

## 2024-04-16 LAB — C DIFFICILE QUICK SCREEN W PCR REFLEX
C Diff antigen: NEGATIVE
C Diff interpretation: NOT DETECTED
C Diff toxin: NEGATIVE

## 2024-04-16 LAB — MAGNESIUM: Magnesium: 1.8 mg/dL (ref 1.7–2.4)

## 2024-04-16 LAB — GASTROINTESTINAL PANEL BY PCR, STOOL (REPLACES STOOL CULTURE)

## 2024-04-16 LAB — GLUCOSE, CAPILLARY
Glucose-Capillary: 152 mg/dL — ABNORMAL HIGH (ref 70–99)
Glucose-Capillary: 170 mg/dL — ABNORMAL HIGH (ref 70–99)
Glucose-Capillary: 132 mg/dL — ABNORMAL HIGH (ref 70–99)
Glucose-Capillary: 200 mg/dL — ABNORMAL HIGH (ref 70–99)

## 2024-04-16 LAB — BASIC METABOLIC PANEL WITH GFR
Anion gap: 11 (ref 5–15)
BUN: 13 mg/dL (ref 8–23)
CO2: 23 mmol/L (ref 22–32)
Calcium: 8.2 mg/dL — ABNORMAL LOW (ref 8.9–10.3)
Chloride: 109 mmol/L (ref 98–111)
Creatinine, Ser: 1.01 mg/dL (ref 0.61–1.24)
GFR, Estimated: >60 mL/min
Glucose, Bld: 133 mg/dL — ABNORMAL HIGH (ref 70–99)
Potassium: 3 mmol/L — ABNORMAL LOW (ref 3.5–5.1)
Sodium: 142 mmol/L (ref 135–145)

## 2024-04-16 LAB — PHOSPHORUS: Phosphorus: 3 mg/dL (ref 2.5–4.6)

## 2024-04-16 MED ORDER — PIPERACILLIN-TAZOBACTAM 3.375 G IVPB 30 MIN
3.3750 g | Freq: Once | INTRAVENOUS | Status: AC
  Administered 2024-04-16: 3.375 g via INTRAVENOUS
  Filled 2024-04-16: qty 50

## 2024-04-16 MED ORDER — FERROUS SULFATE 325 (65 FE) MG PO TABS
325.0000 mg | ORAL_TABLET | Freq: Every day | ORAL | Status: DC
  Administered 2024-04-16 – 2024-04-17 (×2): 325 mg via ORAL
  Filled 2024-04-16 (×2): qty 1

## 2024-04-16 MED ORDER — VANCOMYCIN HCL IN DEXTROSE 1-5 GM/200ML-% IV SOLN: 1000.0000 mg | Freq: Two times a day (BID) | INTRAVENOUS | Status: DC

## 2024-04-16 MED ORDER — DAPAGLIFLOZIN PROPANEDIOL 10 MG PO TABS
10.0000 mg | ORAL_TABLET | Freq: Every day | ORAL | Status: DC
  Filled 2024-04-16: qty 1

## 2024-04-16 MED ORDER — DAPAGLIFLOZIN PROPANEDIOL 10 MG PO TABS
10.0000 mg | ORAL_TABLET | Freq: Every day | ORAL | Status: DC
  Administered 2024-04-17: 10 mg via ORAL
  Filled 2024-04-16: qty 1

## 2024-04-16 MED ORDER — POTASSIUM CHLORIDE CRYS ER 20 MEQ PO TBCR
40.0000 meq | EXTENDED_RELEASE_TABLET | ORAL | Status: AC
  Administered 2024-04-16 (×3): 40 meq via ORAL
  Filled 2024-04-16 (×3): qty 2

## 2024-04-16 MED ORDER — TESTOSTERONE 20.25 MG/ACT (1.62%) TD GEL: 3.0000 | Freq: Every day | TRANSDERMAL | Status: DC

## 2024-04-16 MED ORDER — AMLODIPINE BESYLATE 10 MG PO TABS
10.0000 mg | ORAL_TABLET | Freq: Every day | ORAL | Status: DC
  Administered 2024-04-16 – 2024-04-17 (×2): 10 mg via ORAL
  Filled 2024-04-16 (×2): qty 1

## 2024-04-16 MED ORDER — LOPERAMIDE HCL 2 MG PO CAPS
2.0000 mg | ORAL_CAPSULE | Freq: Four times a day (QID) | ORAL | Status: DC | PRN
  Administered 2024-04-16 (×2): 2 mg via ORAL
  Filled 2024-04-16 (×2): qty 1

## 2024-04-16 MED ORDER — GABAPENTIN 300 MG PO CAPS
300.0000 mg | ORAL_CAPSULE | Freq: Every day | ORAL | Status: DC
  Administered 2024-04-16: 300 mg via ORAL
  Filled 2024-04-16: qty 1

## 2024-04-16 MED ORDER — PANTOPRAZOLE SODIUM 40 MG PO TBEC
40.0000 mg | DELAYED_RELEASE_TABLET | Freq: Every day | ORAL | Status: DC
  Administered 2024-04-16 – 2024-04-17 (×2): 40 mg via ORAL
  Filled 2024-04-16 (×2): qty 1

## 2024-04-16 MED ORDER — ACETAMINOPHEN 325 MG PO TABS: 650.0000 mg | ORAL_TABLET | Freq: Four times a day (QID) | ORAL | Status: DC | PRN

## 2024-04-16 MED ORDER — VANCOMYCIN HCL 2000 MG/400ML IV SOLN
2000.0000 mg | Freq: Once | INTRAVENOUS | Status: AC
  Administered 2024-04-16: 2000 mg via INTRAVENOUS
  Filled 2024-04-16: qty 400

## 2024-04-16 MED ORDER — ROSUVASTATIN CALCIUM 10 MG PO TABS
40.0000 mg | ORAL_TABLET | Freq: Every day | ORAL | Status: DC
  Administered 2024-04-16: 40 mg via ORAL
  Filled 2024-04-16: qty 4

## 2024-04-16 MED ORDER — LOPERAMIDE HCL 2 MG PO CAPS
4.0000 mg | ORAL_CAPSULE | ORAL | Status: DC | PRN
  Administered 2024-04-16: 2 mg via ORAL
  Administered 2024-04-17: 4 mg via ORAL
  Filled 2024-04-16 (×2): qty 2

## 2024-04-16 MED ORDER — PIPERACILLIN-TAZOBACTAM 3.375 G IVPB
3.3750 g | Freq: Three times a day (TID) | INTRAVENOUS | Status: DC
  Administered 2024-04-16: 3.375 g via INTRAVENOUS
  Filled 2024-04-16: qty 50

## 2024-04-16 MED ORDER — ACETAMINOPHEN 650 MG RE SUPP: 650.0000 mg | Freq: Four times a day (QID) | RECTAL | Status: DC | PRN

## 2024-04-16 MED ORDER — POTASSIUM CHLORIDE CRYS ER 20 MEQ PO TBCR
40.0000 meq | EXTENDED_RELEASE_TABLET | Freq: Once | ORAL | Status: AC
  Administered 2024-04-16: 40 meq via ORAL
  Filled 2024-04-16: qty 2

## 2024-04-16 MED ORDER — INSULIN ASPART 100 UNIT/ML IJ SOLN
0.0000 [IU] | Freq: Three times a day (TID) | INTRAMUSCULAR | Status: DC
  Administered 2024-04-16: 1 [IU] via SUBCUTANEOUS
  Administered 2024-04-16 – 2024-04-17 (×3): 2 [IU] via SUBCUTANEOUS
  Filled 2024-04-16: qty 3
  Filled 2024-04-16: qty 2
  Filled 2024-04-16: qty 1
  Filled 2024-04-16: qty 2

## 2024-04-16 MED ORDER — INSULIN ASPART 100 UNIT/ML IJ SOLN: 0.0000 [IU] | Freq: Every day | INTRAMUSCULAR | Status: DC

## 2024-04-16 NOTE — Plan of Care (Signed)
   Problem: Coping: Goal: Ability to adjust to condition or change in health will improve Outcome: Progressing   Problem: Fluid Volume: Goal: Ability to maintain a balanced intake and output will improve Outcome: Progressing   Problem: Nutritional: Goal: Maintenance of adequate nutrition will improve Outcome: Progressing   Problem: Skin Integrity: Goal: Risk for impaired skin integrity will decrease Outcome: Progressing

## 2024-04-16 NOTE — Progress Notes (Signed)
 ED Pharmacy Antibiotic Sign Off An antibiotic consult was received from an ED provider for Vancomycin  per pharmacy dosing for febrile neutropenia. A chart review was completed to assess appropriateness.   The following one time order(s) were placed:  Vancomycin  2g IV  Further antibiotic and/or antibiotic pharmacy consults should be ordered by the admitting provider if indicated.   Thank you for allowing pharmacy to be a part of this patient's care.   Arvin Gauss, PharmD Clinical Pharmacist 04/16/24 12:28 AM

## 2024-04-16 NOTE — Progress Notes (Signed)
" °   04/16/24 2252  BiPAP/CPAP/SIPAP  $ Non-Invasive Home Ventilator  Initial  BiPAP/CPAP/SIPAP Pt Type Adult  BiPAP/CPAP/SIPAP Resmed  Mask Type Nasal pillows  Respiratory Rate 18 breaths/min  FiO2 (%) 21 %  Patient Home Machine No  Patient Home Mask Yes  Patient Home Tubing No  Auto Titrate Yes  Minimum cmH2O 5 cmH2O  Maximum cmH2O 15 cmH2O  CPAP/SIPAP surface wiped down Yes  Device Plugged into RED Power Outlet Yes  BiPAP/CPAP /SiPAP Vitals  Pulse Rate 64  Resp 18  SpO2 96 %  Bilateral Breath Sounds Clear  MEWS Score/Color  MEWS Score 0  MEWS Score Color Green    "

## 2024-04-16 NOTE — Assessment & Plan Note (Signed)
 Clinically resolved diarrhea.  C diff negative  Stool panel negative for infection  Patient was placed on supportive medical therapy with good toleration.   Had as needed loperamide  with good toleration.  Plan to follow up as outpatient with oncology

## 2024-04-16 NOTE — Progress Notes (Signed)
 Pharmacy Antibiotic Note  [name] [name] is a 74 y.o. male admitted on 04/15/2024 with fever with unknown infectious source.  PMH significant for pancreatic cancer, currently undergoing chemotherapy, recent hospitalization for neutropenic fever.  Pharmacy has been consulted for Vancomycin  and Zosyn  dosing.  Plan: Zosyn  3.375g IV q8h (4 hour infusion). Vancomycin  1000 mg IV Q 12 hrs. Goal AUC 400-550.  Expected AUC: 490.1  SCr used: 1.05 Daily Scr while on both vancomycin  and zosyn  F/u culture results & sensitivities  Height: 5' 11 (180.3 cm) Weight: 94.8 kg (209 lb) IBW/kg (Calculated) : 75.3  Temp (24hrs), Avg:99.1 F (37.3 C), Min:98.7 F (37.1 C), Max:99.5 F (37.5 C)  Recent Labs  Lab 04/15/24 2240 04/15/24 2247  WBC 3.5*  --   CREATININE 1.05  --   LATICACIDVEN  --  0.6    Estimated Creatinine Clearance: 73.6 mL/min (by C-G formula based on SCr of 1.05 mg/dL).    Allergies[1]  Antimicrobials this admission: 1/30 Zosyn  >>   1/30 Vancomycin  >>    Dose adjustments this admission:    Microbiology results: 1/30 BCx:      Thank you for allowing pharmacy to be a part of this patients care.  Talin Rozeboom, PharmD 04/16/2024 2:40 AM     [1]  Allergies Allergen Reactions   Yellow Jacket Venom Anaphylaxis

## 2024-04-16 NOTE — Assessment & Plan Note (Signed)
Continue insulin sliding scale for glucose cover and monitoring.  Continue statin therapy.

## 2024-04-16 NOTE — Hospital Course (Signed)
 [name] [name] was admitted to the hospital with the working diagnosis of fever and diarrhea  74 yo male with the past medical history of locally advanced pancreatic adenocarcinoma, T2DM, hypertension, hyperlipidemia, atrial fibrillation, coronary artery disease, and heart failure who presented with fever.  Recent hospitalization 01/10 to 01/14 for neutropenic fever, severe diarrhea, AKI and electrolyte abnormalities, related to chemotherapy   Most recent chemotherapy on 01/20, 24 hrs prior to admission he had recurrent diarrhea, fever and chills. Because persistent high fever he presented to the ED.  On his initial physical examination his temperature was 100.8, blood pressure 154/77, HR 103, RR 16 and 02 saturation 97% Lungs with no wheezing or rhonchi, heart with S1 and S2 present and regular, abdomen with no distention, and no lower extremity edema   Na 141, K 2,6 Cl 105 bicarbonate 24 glucose 206 bun 14 cr 1,0 Mg 1,8  AST 11 ALT 9  Wbc 3,5 hgb 11,5 plt 86  Sars covid 19 negative RSV negative  Influenza negative   Chest radiograph with no cardiomegaly, no infiltrates, small bilateral pleural effusions.   C diff negative  GI panel pending result   Patient was placed on supportive medical therapy with good toleration Electrolytes were corrected.  02/01 diarrhea has resolved, plan for follow up as outpatient.

## 2024-04-16 NOTE — Assessment & Plan Note (Signed)
 Renal function with serum cr at 1,0 with K at 3,0 and serum bicarbonate at 23 Na 142 and P 3,0   Plan to continue K correction with Kcl 40 meq x3 and follow up renal function in am.  Tolerating po well.  Ok to hold on IV fluids

## 2024-04-16 NOTE — Assessment & Plan Note (Signed)
 Under chemotherapy  Pancytopenia but not neutropenia Continue supportive follow up

## 2024-04-16 NOTE — Progress Notes (Signed)
 " Progress Note   Patient: [name] [name] FMW:[id number] DOB: 26-Aug-1950 DOA: 04/15/2024     0 DOS: the patient was seen and examined on 04/16/2024   Brief hospital course: Mr. Olivares was admitted to the hospital with the working diagnosis of fever and diarrhea  74 yo male with the past medical history of locally advanced pancreatic adenocarcinoma, T2DM, hypertension, hyperlipidemia, atrial fibrillation, coronary artery disease, and heart failure who presented with fever.  Recent hospitalization 01/10 to 01/14 for neutropenic fever, severe diarrhea, AKI and electrolyte abnormalities, related to chemotherapy   Most recent chemotherapy on 01/20, 24 hrs prior to admission he had recurrent diarrhea, fever and chills. Because persistent high fever he presented to the ED.  On his initial physical examination his temperature was 100.8, blood pressure 154/77, HR 103, RR 16 and 02 saturation 97% Lungs with no wheezing or rhonchi, heart with S1 and S2 present and regular, abdomen with no distention, and no lower extremity edema   Na 141, K 2,6 Cl 105 bicarbonate 24 glucose 206 bun 14 cr 1,0 Mg 1,8  AST 11 ALT 9  Wbc 3,5 hgb 11,5 plt 86  Sars covid 19 negative RSV negative  Influenza negative   Chest radiograph with no cardiomegaly, no infiltrates, small bilateral pleural effusions.   C diff negative  GI panel pending result   Assessment and Plan: Chemotherapy induced diarrhea Clinically improving diarrhea.  C diff negative  Stool panel in progress Patient is tolerating po well.   Plan to continue supportive medical therapy As needed imodium  Ok to discontinue enteral precautions    Hypokalemia Renal function with serum cr at 1,0 with K at 3,0 and serum bicarbonate at 23 Na 142 and P 3,0   Plan to continue K correction with Kcl 40 meq x3 and follow up renal function in am.  Tolerating po well.  Ok to hold on IV fluids   Chronic diastolic CHF (congestive heart failure) (HCC) No signs of  acute exacerbation  Ok to resume SGLT 2 inh Continue blood pressure control with amlodipine    Pancreatic cancer (HCC) Under chemotherapy  Pancytopenia but not neutropenia Continue supportive follow up   Type 2 diabetes mellitus with hyperlipidemia (HCC) Continue insulin  sliding scale for glucose cover and monitoring  Continue statin therapy         Subjective: Patient with improving diarrhea but not yet resolved, no nausea or vomiting, no chest pain or dyspnea   Physical Exam: Vitals:   04/16/24 0317 04/16/24 0324 04/16/24 0500 04/16/24 1253  BP:  133/78  (!) 148/75  Pulse:  67  71  Resp:   15   Temp:  (!) 97.5 F (36.4 C)  97.8 F (36.6 C)  TempSrc:  Oral  Oral  SpO2:    96%  Weight: 94 kg     Height: 5' 11 (1.803 m)      Neurology awake and alert, deconditioned ENT with mild pallor Cardiovascular with S1 and S2 present and regular  Respiratory with no rales or wheezing no rhonchi Abdomen with no distention  No lower extremity edema   Data Reviewed:    Family Communication: I spoke with patient's wife at the bedside, we talked in detail about patient's condition, plan of care and prognosis and all questions were addressed.   Disposition: Status is: Inpatient Remains inpatient appropriate because: improving diarrhea   Planned Discharge Destination: Home      Author: Elidia Toribio Furnace, MD 04/16/2024 1:01 PM  For on call review  [email or web address]  "

## 2024-04-16 NOTE — Assessment & Plan Note (Signed)
 No signs of acute exacerbation  Ok to resume SGLT 2 inh Continue blood pressure control with amlodipine 

## 2024-04-17 DIAGNOSIS — I480 Paroxysmal atrial fibrillation: Secondary | ICD-10-CM

## 2024-04-17 DIAGNOSIS — E1169 Type 2 diabetes mellitus with other specified complication: Secondary | ICD-10-CM | POA: Diagnosis not present

## 2024-04-17 DIAGNOSIS — R509 Fever, unspecified: Secondary | ICD-10-CM | POA: Diagnosis not present

## 2024-04-17 DIAGNOSIS — I5032 Chronic diastolic (congestive) heart failure: Secondary | ICD-10-CM | POA: Diagnosis not present

## 2024-04-17 DIAGNOSIS — E785 Hyperlipidemia, unspecified: Secondary | ICD-10-CM | POA: Diagnosis not present

## 2024-04-17 DIAGNOSIS — C252 Malignant neoplasm of tail of pancreas: Secondary | ICD-10-CM | POA: Diagnosis not present

## 2024-04-17 DIAGNOSIS — K521 Toxic gastroenteritis and colitis: Secondary | ICD-10-CM | POA: Diagnosis not present

## 2024-04-17 DIAGNOSIS — T451X5A Adverse effect of antineoplastic and immunosuppressive drugs, initial encounter: Secondary | ICD-10-CM | POA: Diagnosis not present

## 2024-04-17 DIAGNOSIS — E876 Hypokalemia: Secondary | ICD-10-CM | POA: Diagnosis not present

## 2024-04-17 LAB — BASIC METABOLIC PANEL WITH GFR
Anion gap: 8 (ref 5–15)
BUN: 12 mg/dL (ref 8–23)
CO2: 24 mmol/L (ref 22–32)
Calcium: 8.5 mg/dL — ABNORMAL LOW (ref 8.9–10.3)
Chloride: 112 mmol/L — ABNORMAL HIGH (ref 98–111)
Creatinine, Ser: 0.95 mg/dL (ref 0.61–1.24)
GFR, Estimated: >60 mL/min
Glucose, Bld: 157 mg/dL — ABNORMAL HIGH (ref 70–99)
Potassium: 3.9 mmol/L (ref 3.5–5.1)
Sodium: 143 mmol/L (ref 135–145)

## 2024-04-17 LAB — MAGNESIUM: Magnesium: 1.8 mg/dL (ref 1.7–2.4)

## 2024-04-17 LAB — CREATININE, SERUM
Creatinine, Ser: 0.97 mg/dL (ref 0.61–1.24)
GFR, Estimated: >60 mL/min

## 2024-04-17 LAB — GLUCOSE, CAPILLARY: Glucose-Capillary: 151 mg/dL — ABNORMAL HIGH (ref 70–99)

## 2024-04-17 NOTE — Care Management CC44 (Signed)
"         Condition Code 44 Documentation Completed  Patient Details  Name: [name] [name] MRN: [id number] Date of Birth: Jul 17, 1950   Condition Code 44 given:  Yes Patient signature on Condition Code 44 notice:  Yes Documentation of 2 MD's agreement:  Yes Code 44 added to claim:  Yes    Sonda Manuella Quill, RN 04/17/2024, 12:46 PM  "

## 2024-04-17 NOTE — TOC Initial Note (Signed)
 Transition of Care [facility]) - Initial/Assessment Note    Patient Details  Name: [name] [name] MRN: [id number] Date of Birth: 09/11/1950  [facility] CM/SW Contact:    Sonda Manuella Quill, RN Phone Number: 04/17/2024, 12:51 PM  Clinical Narrative:                 Beatris w/ pt's spouse Dondrell Loudermilk over room phone; she said pt lives at home; they plan for him to return w/ her support at d/c; Mrs Mcclintock can be contacted at [phone]; she will provide transportation; insurance/PCP verified; she denied pt experiencing SDOH risks; pt does not have DME, HH services, or home oxygen; no IP CM needs.  Expected Discharge Plan: Home/Self Care Barriers to Discharge: No Barriers Identified   Patient Goals and CMS Choice Patient states their goals for this hospitalization and ongoing recovery are:: home          Expected Discharge Plan and Services   Discharge Planning Services: CM Consult   Living arrangements for the past 2 months: Single Family Home Expected Discharge Date: 04/17/24               DME Arranged: N/A DME Agency: NA       HH Arranged: NA HH Agency: NA        Prior Living Arrangements/Services Living arrangements for the past 2 months: Single Family Home Lives with:: Spouse Patient language and need for interpreter reviewed:: Yes Do you feel safe going back to the place where you live?: Yes      Need for Family Participation in Patient Care: Yes (Comment) Care giver support system in place?: Yes (comment) Current home services:  (n/a) Criminal Activity/Legal Involvement Pertinent to Current Situation/Hospitalization: No - Comment as needed  Activities of Daily Living   ADL Screening (condition at time of admission) Independently performs ADLs?: Yes (appropriate for developmental age) Is the patient deaf or have difficulty hearing?: No Does the patient have difficulty seeing, even when wearing glasses/contacts?: No Does the patient have difficulty  concentrating, remembering, or making decisions?: No  Permission Sought/Granted Permission sought to share information with : Case Manager Permission granted to share information with : Yes, Verbal Permission Granted  Share Information with NAME: Case Manager     Permission granted to share info w Relationship: Dalin Caldera (spouse) [phone]     Emotional Assessment Appearance:: Other (Comment Required (unable to assess) Attitude/Demeanor/Rapport: Unable to Assess Affect (typically observed): Unable to Assess Orientation: :  (unable to assess) Alcohol / Substance Use: Not Applicable Psych Involvement: No (comment)  Admission diagnosis:  Hypokalemia [E87.6] Hypogonadism male [E29.1] Febrile neutropenia [D70.9, R50.81] Fever [R50.9] Type 2 diabetes mellitus with other circulatory complications (HCC) [E11.59] Hyperlipidemia associated with type 2 diabetes mellitus (HCC) [E11.69, E78.5] Patient Active Problem List   Diagnosis Date Noted   Fever 04/16/2024   Chemotherapy induced diarrhea 04/16/2024   Type 2 diabetes mellitus with hyperlipidemia (HCC) 04/16/2024   Intractable nausea and vomiting 03/27/2024   Diarrhea 03/27/2024   Neutropenic fever 03/27/2024   Pancreatic cancer (HCC) 02/22/2024   Iron  deficiency anemia due to chronic blood loss 02/22/2024   Upper GI bleed 02/09/2024   AKI (acute kidney injury) 02/09/2024   Pancreatic mass 02/09/2024   Chronic diastolic CHF (congestive heart failure) (HCC) 02/09/2024   Pancytopenia (HCC) 02/09/2024   Hypokalemia 04/17/2023   Paroxysmal atrial fibrillation (HCC) 04/17/2023   Melanoma in situ (HCC) 02/03/2023   Squamous cell carcinoma of arm, left 09/16/2022  Squamous cell carcinoma in situ (SCCIS) of dorsum of hand 06/17/2022   Squamous cell carcinoma in situ (SCCIS) of skin of chest 08/21/2021   Type 2 diabetes mellitus with hyperglycemia (HCC) 10/16/2020   Aortic atherosclerosis 10/16/2020   History of GI diverticular  bleed 02/14/2020   Diverticulosis 11/03/2019   Spinal stenosis 10/06/2019   Essential hypertension 01/17/2011   Hyperlipidemia 01/17/2011   Hypogonadism male 01/17/2011   Sleep apnea 01/17/2011   History of hyperparathyroidism 01/17/2011   ED (erectile dysfunction) 01/17/2011   Obesity (BMI 30-39.9) 01/17/2011   PCP:  [name], NP Pharmacy:   [id number] - [place], [place] - [address] [place] [address] Phone: [phone] Fax: [phone]     Social Drivers of Health (SDOH) Social History: SDOH Screenings   Food Insecurity: No Food Insecurity (04/17/2024)  Housing: Low Risk (04/17/2024)  Transportation Needs: No Transportation Needs (04/17/2024)  Utilities: Not At Risk (04/17/2024)  Alcohol Screen: Low Risk (02/23/2024)  Depression (PHQ2-9): Low Risk (04/04/2024)  Financial Resource Strain: Low Risk (02/23/2024)  Physical Activity: Sufficiently Active (02/23/2024)  Social Connections: Socially Integrated (04/16/2024)  Stress: No Stress Concern Present (02/23/2024)  Tobacco Use: Low Risk (04/15/2024)  Health Literacy: Adequate Health Literacy (02/23/2024)   SDOH Interventions: Food Insecurity Interventions: Intervention Not Indicated, Inpatient TOC Housing Interventions: Intervention Not Indicated, Inpatient TOC Transportation Interventions: Intervention Not Indicated, Inpatient TOC Utilities Interventions: Intervention Not Indicated, Inpatient TOC   Readmission Risk Interventions    04/17/2024   12:48 PM  Readmission Risk Prevention Plan  Transportation Screening Complete  PCP or Specialist Appt within 3-5 Days Complete  HRI or Home Care Consult Complete  Palliative Care Screening Not Applicable  Medication Review (RN Care Manager) Complete

## 2024-04-17 NOTE — Care Management Obs Status (Signed)
 MEDICARE OBSERVATION STATUS NOTIFICATION   Patient Details  Name: [name] [name] MRN: [id number] Date of Birth: 07-31-1950   Medicare Observation Status Notification Given:  Yes    Sonda Manuella Quill, RN 04/17/2024, 12:46 PM

## 2024-04-17 NOTE — Discharge Summary (Signed)
 " Physician Discharge Summary   Patient: [name] [name] MRN: [id number] DOB: Jan 03, 1951  Admit date:     04/15/2024  Discharge date: 04/17/24  Discharge Physician: [name] [name]   PCP: [name] [name] POUR, NP   Recommendations at discharge:    Patient with resolved diarrhea, K has been corrected, tolerating po well, no fever Follow up renal function, electrolytes and cell count as outpatient in 7 days  Follow up with [name] Gretta NP in 7 to 10 days Follow up with Oncology as scheduled   Discharge Diagnoses: Active Problems:   Chemotherapy induced diarrhea   Hypokalemia   Chronic diastolic CHF (congestive heart failure) (HCC)   Pancreatic cancer (HCC)   Type 2 diabetes mellitus with hyperlipidemia (HCC)  Resolved Problems:   * No resolved hospital problems. [facility] Course: [name] was admitted to the hospital with the working diagnosis of fever and diarrhea  74 yo male with the past medical history of locally advanced pancreatic adenocarcinoma, T2DM, hypertension, hyperlipidemia, atrial fibrillation, coronary artery disease, and heart failure who presented with fever.  Recent hospitalization 01/10 to 01/14 for neutropenic fever, severe diarrhea, AKI and electrolyte abnormalities, related to chemotherapy   Most recent chemotherapy on 01/20, 24 hrs prior to admission he had recurrent diarrhea, fever and chills. Because persistent high fever he presented to the ED.  On his initial physical examination his temperature was 100.8, blood pressure 154/77, HR 103, RR 16 and 02 saturation 97% Lungs with no wheezing or rhonchi, heart with S1 and S2 present and regular, abdomen with no distention, and no lower extremity edema   Na 141, K 2,6 Cl 105 bicarbonate 24 glucose 206 bun 14 cr 1,0 Mg 1,8  AST 11 ALT 9  Wbc 3,5 hgb 11,5 plt 86  Sars covid 19 negative RSV negative  Influenza negative   Chest radiograph with no cardiomegaly, no infiltrates, small bilateral pleural  effusions.   C diff negative  GI panel pending result   Patient was placed on supportive medical therapy with good toleration Electrolytes were corrected.  02/01 diarrhea has resolved, plan for follow up as outpatient.   Assessment and Plan: Chemotherapy induced diarrhea Clinically resolved diarrhea.  C diff negative  Stool panel negative for infection  Patient was placed on supportive medical therapy with good toleration.   Had as needed loperamide  with good toleration.  Plan to follow up as outpatient with oncology    Hypokalemia Patient had IV fluids and electrolytes were corrected.   At the time of discharge his renal function had a serum cr of 0,95 with K at 3,9 and serum bicarbonate at 24 Na 143 and Mg 1.8   Plan to follow up renal function and electrolytes as outpatient.   Chronic diastolic CHF (congestive heart failure) (HCC) No signs of acute exacerbation  Ok to resume SGLT 2 inh Continue blood pressure control with amlodipine    Paroxysmal atrial fibrillation (HCC) Patient will continue anticoagulation with apixaban    Type 2 diabetes mellitus with hyperlipidemia (HCC) Patient was placed on insulin  sliding scale for glucose cover and monitoring  Positive hyperglycemia on admission 206, at the time of discharge is 157 mg/dl.  Continue statin therapy   Pancreatic cancer (HCC) Under chemotherapy  Pancytopenia but not neutropenia At the time of discharge he has been afebrile Plan to follow up cell count as outpatient        Consultants: none  Procedures performed: none   Disposition: Home Diet recommendation:  Cardiac and Carb  modified diet DISCHARGE MEDICATION: Allergies as of 04/17/2024       Reactions   Yellow Jacket Venom Anaphylaxis        Medication List     TAKE these medications    Accu-Chek FastClix Lancets Misc 1 each by Does not apply route daily.   Accu-Chek Guide test strip Generic drug: glucose blood Use one daily to check  blood sugar   acetaminophen  325 MG tablet Commonly known as: TYLENOL  Take 2 tablets (650 mg total) by mouth every 6 (six) hours as needed for mild pain (pain score 1-3) (or Fever >/= 101).   amLODipine  10 MG tablet Commonly known as: NORVASC  TAKE 1 TABLET BY MOUTH EVERY DAY FOR BLOOD PRESSURE   apixaban  5 MG Tabs tablet Commonly known as: ELIQUIS  Take 1 tablet (5 mg total) by mouth 2 (two) times daily.   dexamethasone  4 MG tablet Commonly known as: DECADRON  Take 2 tablets (8 mg total) by mouth daily. Take 2 tablets daily x 3 days starting the day after chemotherapy. Take with food.   diphenhydrAMINE -zinc  acetate cream Commonly known as: BENADRYL  Apply topically 3 (three) times daily as needed for itching.   diphenoxylate -atropine  2.5-0.025 MG tablet Commonly known as: LOMOTIL  Take 1-2 tablets by mouth 4 (four) times daily as needed for diarrhea or loose stools.   EPINEPHrine  0.3 mg/0.3 mL Soaj injection Commonly known as: EPI-PEN Inject 0.3 mg into the muscle as needed for anaphylaxis.   Farxiga  10 MG Tabs tablet Generic drug: dapagliflozin  propanediol TAKE 1 TABLET (10 MG TOTAL) BY MOUTH DAILY BEFORE BREAKFAST. FOR DIABETES.   gabapentin  300 MG capsule Commonly known as: NEURONTIN  Take 300 mg by mouth at bedtime.   Iron  (Ferrous Sulfate ) 325 (65 Fe) MG Tabs Take 65 mg by mouth daily with breakfast.   lidocaine -prilocaine  cream Commonly known as: EMLA  Apply to affected area once   loperamide  2 MG capsule Commonly known as: IMODIUM  Take 2 tabs by mouth with first loose stool, then 1 tab with each additional loose stool as needed. Do not exceed 8 tabs in a 24-hour period   multivitamin with minerals tablet Take 1 tablet by mouth daily at 12 noon.   ondansetron  8 MG tablet Commonly known as: Zofran  Take 1 tablet (8 mg total) by mouth every 8 (eight) hours as needed for nausea or vomiting. Start on the third day after chemotherapy   pantoprazole  40 MG  tablet Commonly known as: PROTONIX  TAKE 1 TABLET BY MOUTH EVERY DAY   PRESCRIPTION MEDICATION CPAP- At bedtime   prochlorperazine  10 MG tablet Commonly known as: COMPAZINE  Take 1 tablet (10 mg total) by mouth every 6 (six) hours as needed for nausea or vomiting (Nausea or vomiting).   rosuvastatin  40 MG tablet Commonly known as: CRESTOR  Take 1 tablet (40 mg total) by mouth at bedtime.   Testosterone  20.25 MG/ACT (1.62%) Gel APPLY 3 PUMPS TO THE SHOULDER AND ARM DAILY        Discharge Exam: Filed Weights   04/15/24 2116 04/16/24 0317  Weight: 94.8 kg 94 kg   BP 133/79 (BP Location: Right Arm)   Pulse 63   Temp 97.7 F (36.5 C)   Resp 18   Ht 5' 11 (1.803 m)   Wt 94 kg   SpO2 98%   BMI 28.90 kg/m   Patient with no chest pain and no dyspnea, no nausea or vomiting.   Neurology awake and alert ENT with mild pallor with no icterus Cardiovascular with S1 and S2 present  and regular with no gallops or rubs, no murmurs Respiratory with no rales or wheezing, no rhonchi Abdomen with no distention, soft and non tender No lower extremity edema   Condition at discharge: stable  The results of significant diagnostics from this hospitalization (including imaging, microbiology, ancillary and laboratory) are listed below for reference.   Imaging Studies: DG Chest 2 View Result Date: 04/15/2024 EXAM: 2 VIEW(S) XRAY OF THE CHEST 04/15/2024 09:37:00 PM COMPARISON: 03/27/2024 CLINICAL HISTORY: SIRS (systemic inflammatory response syndrome) (HCC). FINDINGS: LINES, TUBES AND DEVICES: Right Port-A-Cath in place with tip overlying midportion of superior vena cava. LUNGS AND PLEURA: No focal pulmonary opacity. No pleural effusion. No pneumothorax. HEART AND MEDIASTINUM: Aortic arch calcifications. BONES AND SOFT TISSUES: No acute osseous abnormality. IMPRESSION: 1. No acute cardiopulmonary abnormality. Electronically signed by: Norman Gatlin MD 04/15/2024 11:02 PM EST RP Workstation:  [id number]   DG Chest 2 View Result Date: 03/27/2024 EXAM: 2 VIEW(S) XRAY OF THE CHEST 03/27/2024 12:13:00 AM COMPARISON: 03/07/2024 CLINICAL HISTORY: Fever FINDINGS: LINES, TUBES AND DEVICES: Right chest Port-A-Cath in place with tip overlying the expected region of the mid superior vena cava. LUNGS AND PLEURA: No focal pulmonary opacity. Blunting of bilateral costophrenic angles. No pneumothorax. HEART AND MEDIASTINUM: No acute abnormality of the cardiac and mediastinal silhouettes. Aortic atherosclerosis. BONES AND SOFT TISSUES: No acute osseous abnormality. IMPRESSION: 1. Blunting of bilateral costophrenic angles. Possible trace pleural effusion versus scarring. 2. Right chest Port-A-Cath with tip overlying the mid superior vena cava. Electronically signed by: Morgane Naveau MD MD 03/27/2024 12:16 AM EST RP Workstation: [id number]    Microbiology: Results for orders placed or performed during the hospital encounter of 04/15/24  Blood culture (routine x 2)     Status: None (Preliminary result)   Collection Time: 04/15/24 10:40 PM   Specimen: BLOOD  Result Value Ref Range Status   Specimen Description   Final    BLOOD RIGHT ANTECUBITAL Performed at [facility], [address], [place], [place] [address]    Special Requests   Final    BOTTLES DRAWN AEROBIC AND ANAEROBIC Blood Culture adequate volume Performed at [facility], [address] [place], [place] [address]    Culture   Final    NO GROWTH 1 DAY Performed at [facility], [address], [place], [place] [address]    Report Status PENDING  Incomplete  Resp panel by RT-PCR (RSV, Flu A&B, Covid) Anterior Nasal Swab     Status: None   Collection Time: 04/15/24 10:50 PM   Specimen: Anterior Nasal Swab  Result Value Ref Range Status   SARS Coronavirus 2 by RT PCR NEGATIVE NEGATIVE Final    Comment: (NOTE) SARS-CoV-2 target nucleic acids are NOT DETECTED.  The SARS-CoV-2 RNA is generally  detectable in upper respiratory specimens during the acute phase of infection. The lowest concentration of SARS-CoV-2 viral copies this assay can detect is 138 copies/mL. A negative result does not preclude SARS-Cov-2 infection and should not be used as the sole basis for treatment or other patient management decisions. A negative result may occur with  improper specimen collection/handling, submission of specimen other than nasopharyngeal swab, presence of viral mutation(s) within the areas targeted by this assay, and inadequate number of viral copies(<138 copies/mL). A negative result must be combined with clinical observations, patient history, and epidemiological information. The expected result is Negative.  Fact Sheet for Patients:  bloggercourse.com  Fact Sheet for Healthcare Providers:  seriousbroker.it  This test is  no t yet approved or cleared by the [place]  FDA and  has been authorized for detection and/or diagnosis of SARS-CoV-2 by FDA under an Emergency Use Authorization (EUA). This EUA will remain  in effect (meaning this test can be used) for the duration of the COVID-19 declaration under Section 564(b)(1) of the Act, 21 U.S.C.section 360bbb-3(b)(1), unless the authorization is terminated  or revoked sooner.       Influenza A by PCR NEGATIVE NEGATIVE Final   Influenza B by PCR NEGATIVE NEGATIVE Final    Comment: (NOTE) The Xpert Xpress SARS-CoV-2/FLU/RSV plus assay is intended as an aid in the diagnosis of influenza from Nasopharyngeal swab specimens and should not be used as a sole basis for treatment. Nasal washings and aspirates are unacceptable for Xpert Xpress SARS-CoV-2/FLU/RSV testing.  Fact Sheet for Patients: bloggercourse.com  Fact Sheet for Healthcare Providers: seriousbroker.it  This test is not yet approved or cleared by the [place]  FDA  and has been authorized for detection and/or diagnosis of SARS-CoV-2 by FDA under an Emergency Use Authorization (EUA). This EUA will remain in effect (meaning this test can be used) for the duration of the COVID-19 declaration under Section 564(b)(1) of the Act, 21 U.S.C. section 360bbb-3(b)(1), unless the authorization is terminated or revoked.     Resp Syncytial Virus by PCR NEGATIVE NEGATIVE Final    Comment: (NOTE) Fact Sheet for Patients: bloggercourse.com  Fact Sheet for Healthcare Providers: seriousbroker.it  This test is not yet approved or cleared by the [place]  FDA and has been authorized for detection and/or diagnosis of SARS-CoV-2 by FDA under an Emergency Use Authorization (EUA). This EUA will remain in effect (meaning this test can be used) for the duration of the COVID-19 declaration under Section 564(b)(1) of the Act, 21 U.S.C. section 360bbb-3(b)(1), unless the authorization is terminated or revoked.  Performed at [facility], [address] [place], [place] [address]   Blood culture (routine x 2)     Status: None (Preliminary result)   Collection Time: 04/15/24 11:15 PM   Specimen: BLOOD  Result Value Ref Range Status   Specimen Description   Final    BLOOD LEFT ANTECUBITAL Performed at [facility], [address] [place], [place] [address]    Special Requests   Final    BOTTLES DRAWN AEROBIC ONLY Blood Culture results may not be optimal due to an inadequate volume of blood received in culture bottles Performed at [facility], [address], [place], [place] [address]    Culture   Final    NO GROWTH 1 DAY Performed at [facility], [address], [place], [place] [address]    Report Status PENDING  Incomplete  C Difficile Quick Screen w PCR reflex     Status: None   Collection Time: 04/16/24  9:48 AM   Specimen: STOOL  Result Value Ref  Range Status   C Diff antigen NEGATIVE NEGATIVE Final   C Diff toxin NEGATIVE NEGATIVE Final   C Diff interpretation No C. difficile detected.  Final    Comment: Performed at [facility], [address], [place], [place] [address]  Gastrointestinal Panel by PCR , Stool     Status: None   Collection Time: 04/16/24  9:48 AM   Specimen: STOOL  Result Value Ref Range Status   Campylobacter species NOT DETECTED NOT DETECTED Final   Plesimonas shigelloides NOT DETECTED NOT DETECTED Final   Salmonella species NOT  DETECTED NOT DETECTED Final   Yersinia enterocolitica NOT DETECTED NOT DETECTED Final   Vibrio species NOT DETECTED NOT DETECTED Final   Vibrio cholerae NOT DETECTED NOT DETECTED Final   Enteroaggregative E coli (EAEC) NOT DETECTED NOT DETECTED Final   Enteropathogenic E coli (EPEC) NOT DETECTED NOT DETECTED Final   Enterotoxigenic E coli (ETEC) NOT DETECTED NOT DETECTED Final   Shiga like toxin producing E coli (STEC) NOT DETECTED NOT DETECTED Final   Shigella/Enteroinvasive E coli (EIEC) NOT DETECTED NOT DETECTED Final   Cryptosporidium NOT DETECTED NOT DETECTED Final   Cyclospora cayetanensis NOT DETECTED NOT DETECTED Final   Entamoeba histolytica NOT DETECTED NOT DETECTED Final   Giardia lamblia NOT DETECTED NOT DETECTED Final   Adenovirus F40/41 NOT DETECTED NOT DETECTED Final   Astrovirus NOT DETECTED NOT DETECTED Final   Norovirus GI/GII NOT DETECTED NOT DETECTED Final   Rotavirus A NOT DETECTED NOT DETECTED Final   Sapovirus (I, II, IV, and V) NOT DETECTED NOT DETECTED Final    Comment: Performed at [facility], [address] [place], [place] [address]    Labs: CBC: Recent Labs  Lab 04/15/24 2240  WBC 3.5*  NEUTROABS 1.9  HGB 11.5*  HCT 36.9*  MCV 85.4  PLT 86*   Basic Metabolic Panel: Recent Labs  Lab 04/15/24 2240 04/16/24 0645 04/17/24 0708 04/17/24 0727  NA 141 142  --  143  K 2.6* 3.0*  --  3.9  CL 105 109  --  112*   CO2 24 23  --  24  GLUCOSE 206* 133*  --  157*  BUN 14 13  --  12  CREATININE 1.05 1.01 0.97 0.95  CALCIUM  8.6* 8.2*  --  8.5*  MG 1.8  --   --  1.8  PHOS  --  3.0  --   --    Liver Function Tests: Recent Labs  Lab 04/15/24 2240  AST 11*  ALT 9  ALKPHOS 76  BILITOT 0.7  PROT 6.1*  ALBUMIN 3.6   CBG: Recent Labs  Lab 04/16/24 0838 04/16/24 1139 04/16/24 1558 04/16/24 2202 04/17/24 0800  GLUCAP 152* 170* 132* 200* 151*    Discharge time spent: greater than 30 minutes.  Signed: Elidia Toribio Furnace, MD [facility] 04/17/2024 "

## 2024-04-17 NOTE — Assessment & Plan Note (Signed)
Patient will continue anticoagulation with apixaban.  ?

## 2024-04-18 ENCOUNTER — Inpatient Hospital Stay: Admitting: Nurse Practitioner

## 2024-04-18 ENCOUNTER — Inpatient Hospital Stay

## 2024-04-18 ENCOUNTER — Encounter: Payer: Self-pay | Admitting: Nurse Practitioner

## 2024-04-18 DIAGNOSIS — C252 Malignant neoplasm of tail of pancreas: Secondary | ICD-10-CM

## 2024-04-18 MED ORDER — POTASSIUM CHLORIDE CRYS ER 20 MEQ PO TBCR: 20.0000 meq | EXTENDED_RELEASE_TABLET | Freq: Every day | ORAL | 1 refills | Status: AC

## 2024-04-18 NOTE — Progress Notes (Signed)
 "     [facility]   [phone] [phone]) [phone]    Patient Care Team: [name], NP as PCP - General (Internal Medicine) [name], MD as PCP - Cardiology (Cardiology) [name], MD as Consulting Physician (Gastroenterology) [name], MD as Consulting Physician (Hematology) [name], OD (Optometry)   I connected with [name] [name] on 04/18/24 at 12:30 PM EST by video enabled telemedicine visit and verified that I am speaking with the correct person using two identifiers.   I discussed the limitations, risks, security and privacy concerns of performing an evaluation and management service by telemedicine and the availability of in-person appointments. I also discussed with the patient that there may be a patient responsible charge related to this service. The patient expressed understanding and agreed to proceed.   Other persons participating in the visit and their role in the encounter: Wife, Olam   Patients location: Home  Providers location: Home ([facility] closed due to inclement weather)   CHIEF COMPLAINT: Hospital f/up, pancreatic cancer   Oncology History  Pancreatic cancer (HCC)  02/17/2024 Cancer Staging   Staging form: Exocrine Pancreas, AJCC 8th Edition - Clinical stage from 02/17/2024: Stage IIA (cT3, cN0, cM0) - Signed by [name], MD on 02/29/2024 Stage prefix: Initial diagnosis Total positive nodes: 0   02/22/2024 Initial Diagnosis   Pancreatic cancer (HCC)   03/06/2024 Genetic Testing   Negative genetic testing. The report date is March 06, 2024  The CancerNext-Expanded gene panel offered by [employer] and includes sequencing, rearrangement, and RNA analysis for the following 77 genes: AIP, ALK, APC, ATM, BAP1, BARD1, BMPR1A, BRCA1, BRCA2, BRIP1, CDC73, CDH1, CDK4, CDKN1B, CDKN2A, CEBPA, CHEK2, CTNNA1, DDX41, DICER1, ETV6, FH, FLCN, GATA2, LZTR1, MAX, MBD4, MEN1, MET, MLH1, MSH2, MSH3, MSH6,  MUTYH, NF1, NF2, NTHL1, PALB2, PHOX2B, PMS2, POT1, PRKAR1A, PTCH1, PTEN, RAD51C, RAD51D, RB1, RET, RPS20, RUNX1, SDHA, SDHAF2, SDHB, SDHC, SDHD, SMAD4, SMARCA4, SMARCB1, SMARCE1, STK11, SUFU, TMEM127, TP53, TSC1, TSC2, VHL, and WT1 (sequencing and deletion/duplication); AXIN2, CTNNA1, DDX41, EGFR, HOXB13, KIT, MBD4, MITF, MSH3, PDGFRA, POLD1 and POLE (sequencing only); EPCAM and GREM1 (deletion/duplication only). RNA data is routinely analyzed for use in variant interpretation for all genes.    03/14/2024 -  Chemotherapy   Patient is on Treatment Plan : PANCREAS Modified FOLFIRINOX q14d x 4 cycles        CURRENT THERAPY: Neoadjuvant mFOLFIRINOX q2 weeks   INTERVAL HISTORY [name] and his wife present for video visit. Received cycle 2 mFOLFIRINOX on 1/20, tolerated well. Cold sensitivity lasted 2 days, and fatigue from days 3 - 5. He recovered well and was considering a trip to [place]  until diarrhea hit abruptly on day 10 (same as cycle 1). He had 3-4 loose stools for which he was taking imodium . He had lomotil  but wasn't sure how to take it. Eventually developed a fever and went to ED 1/30. CBC showed ANC 1.9, plt 86, K 2.6. He was admitted, all cultures negative, K replaced. He was discharged yesterday. Feeling better, had 1 lose stool today. He has an appetite but weight is down 8 lbs or so. Denies pain.   ROS  All other systems reviewed and negative   Past Medical History:  Diagnosis Date   Atrial fibrillation (HCC)    Cancer [facility])    Pancreas   Clotting disorder July 2024   Lower GI Bleed   Coronary artery disease    Diabetes mellitus without complication (HCC)  Dyslipidemia    Dysrhythmia    A.Fib-on Eliquis    ED (erectile dysfunction)    Hyperlipidemia    Hyperparathyroidism    Hypertension    Hypogonadism, male    Melena 02/09/2024   Sleep apnea      Past Surgical History:  Procedure Laterality Date   ESOPHAGOGASTRODUODENOSCOPY N/A 02/10/2024   Procedure: EGD  (ESOPHAGOGASTRODUODENOSCOPY);  Surgeon: [name], MD;  Location: [facility];  Service: Gastroenterology;  Laterality: N/A;   ESOPHAGOGASTRODUODENOSCOPY N/A 02/17/2024   Procedure: EGD (ESOPHAGOGASTRODUODENOSCOPY);  Surgeon: Burnette Fallow, MD;  Location: [facility];  Service: Gastroenterology;  Laterality: N/A;   EUS N/A 02/17/2024   Procedure: ULTRASOUND, UPPER GI TRACT, ENDOSCOPIC;  Surgeon: Burnette Fallow, MD;  Location: [facility];  Service: Gastroenterology;  Laterality: N/A;  fine needle aspiration   FINE NEEDLE ASPIRATION BIOPSY  02/17/2024   Procedure: FINE NEEDLE ASPIRATION BIOPSY;  Surgeon: Burnette Fallow, MD;  Location: [facility];  Service: Gastroenterology;;   PARATHYROIDECTOMY     PORTACATH PLACEMENT Right 03/07/2024   Procedure: INSERTION, RIGHT TUNNELED CENTRAL VENOUS DEVICE, WITH PORT;  Surgeon: [name] Leonor CROME, MD;  Location: [facility];  Service: General;  Laterality: Right;  PORT-A-CATH INSERTION WITH ULTRASOUND GUIDANCE   SKIN BIOPSY Left 03/02/2019   squamous cell carcinoma in situ, hypertrophic, traumatized   SKIN BIOPSY Right 08/01/2021   residual squamous cell caarcinoma     Outpatient Encounter Medications as of 04/18/2024  Medication Sig   Accu-Chek FastClix Lancets MISC 1 each by Does not apply route daily.   acetaminophen  (TYLENOL ) 325 MG tablet Take 2 tablets (650 mg total) by mouth every 6 (six) hours as needed for mild pain (pain score 1-3) (or Fever >/= 101).   amLODipine  (NORVASC ) 10 MG tablet TAKE 1 TABLET BY MOUTH EVERY DAY FOR BLOOD PRESSURE   apixaban  (ELIQUIS ) 5 MG TABS tablet Take 1 tablet (5 mg total) by mouth 2 (two) times daily.   dapagliflozin  propanediol (FARXIGA ) 10 MG TABS tablet TAKE 1 TABLET (10 MG TOTAL) BY MOUTH DAILY BEFORE BREAKFAST. FOR DIABETES.   dexamethasone  (DECADRON ) 4 MG tablet Take 2 tablets (8 mg total) by mouth daily. Take 2 tablets daily x 3 days starting the day after chemotherapy. Take with food. (Patient not taking:  Reported on 04/16/2024)   diphenhydrAMINE -zinc  acetate (BENADRYL ) cream Apply topically 3 (three) times daily as needed for itching.   diphenoxylate -atropine  (LOMOTIL ) 2.5-0.025 MG tablet Take 1-2 tablets by mouth 4 (four) times daily as needed for diarrhea or loose stools.   EPINEPHrine  0.3 mg/0.3 mL IJ SOAJ injection Inject 0.3 mg into the muscle as needed for anaphylaxis.   gabapentin  (NEURONTIN ) 300 MG capsule Take 300 mg by mouth at bedtime.   glucose blood (ACCU-CHEK GUIDE) test strip Use one daily to check blood sugar   Iron , Ferrous Sulfate , 325 (65 Fe) MG TABS Take 65 mg by mouth daily with breakfast.   lidocaine -prilocaine  (EMLA ) cream Apply to affected area once   loperamide  (IMODIUM ) 2 MG capsule Take 2 tabs by mouth with first loose stool, then 1 tab with each additional loose stool as needed. Do not exceed 8 tabs in a 24-hour period   Multiple Vitamins-Minerals (MULTIVITAMIN WITH MINERALS) tablet Take 1 tablet by mouth daily at 12 noon.   ondansetron  (ZOFRAN ) 8 MG tablet Take 1 tablet (8 mg total) by mouth every 8 (eight) hours as needed for nausea or vomiting. Start on the third day after chemotherapy   pantoprazole  (PROTONIX ) 40 MG tablet TAKE 1 TABLET BY MOUTH  EVERY DAY   PRESCRIPTION MEDICATION CPAP- At bedtime   prochlorperazine  (COMPAZINE ) 10 MG tablet Take 1 tablet (10 mg total) by mouth every 6 (six) hours as needed for nausea or vomiting (Nausea or vomiting).   rosuvastatin  (CRESTOR ) 40 MG tablet Take 1 tablet (40 mg total) by mouth at bedtime.   Testosterone  20.25 MG/ACT (1.62%) GEL APPLY 3 PUMPS TO THE SHOULDER AND ARM DAILY   No facility-administered encounter medications on file as of 04/18/2024.     There were no vitals filed for this visit. There is no height or weight on file to calculate BMI.   ECOG PERFORMANCE STATUS: 1 - Symptomatic but completely ambulatory  PHYSICAL EXAM Patient appears well, wife at his side. Voice is strong, speech is clear. Mood/affect  appear normal for situation. No facial rash. Sclera anicteric. No cough or conversational dyspnea  LAB DATA  No labs for this visit. I have reviewed the most recent lab results      ASSESSMENT & PLAN:  Pancreatic cancer, adenocarcinoma in tail of pancreas, cT3N0M0 -Admitted in early December 2025 for GI bleeding, CT scan incidentally showed a 5.8 cm mass in the tail of pancreas, no evidence of nodal or distant metastasis.  CT scan showed possible invasion to gastric wall. -He underwent a EUS biopsy which confirmed adenocarcinoma.  Tumor invades splenic artery, no other vascular involvement. -Genetics negative -Pt was seen by surgeon [name], team recommends neoadjuvant chemo. He stared mFOLFIRINOX on 03/14/2024, C1 dose reduced for thrombocytopenia. -Mr. Kumpf appears stable, recovering from recent hospitalization. S/p C2 mFOLFIRINOX, he tolerated well with mild cold sensitivity and fatigue until he developed delayed diarrhea and fever and was hospitalized after both cycles.  -Cultures negative, no infectious etiology was found. This is likely secondary to irinotecan . Discussed dose reduction vs holding  -CA 19-9 has not been informative thus far, plan to restage after cycle 5/6 -Met with Dr. Benjie at [facility], he would like to transfer his care there to keep medical and potential surgical care under same healthy system. Guardant 360 drawn at that visit      PLAN: [facility] course reviewed -Discussed hydration, nutrition, and symptom management -Rx: KCL 20 mEq daily (increase to BID if >1 loose BM per day) -Postpone C3 to 04/25/24, with irinotecan  dose reduction, lab/ov prior   -Then transfer to Dr. Benjie at [facility]    I discussed the assessment and treatment plan with the patient. The patient was provided an opportunity to ask questions and all were answered. The patient agreed with the plan and demonstrated an understanding of the instructions.   The patient was advised to call back or  seek an in-person evaluation if the symptoms worsen or if the condition fails to improve as anticipated. No barriers to learning were detected. I spent 24 minutes counseling the patient face to face. The total time spent in the appointment was 30 minutes and more than 50% was on counseling, review of test results, and coordination of care.   Wylodean Shimmel K Vane Yapp, NP 04/18/2024   "

## 2024-04-19 ENCOUNTER — Inpatient Hospital Stay

## 2024-04-19 ENCOUNTER — Inpatient Hospital Stay: Admitting: Nurse Practitioner

## 2024-04-20 ENCOUNTER — Encounter: Payer: Self-pay | Admitting: Nurse Practitioner

## 2024-04-20 ENCOUNTER — Encounter: Payer: Self-pay | Admitting: Genetic Counselor

## 2024-04-20 ENCOUNTER — Inpatient Hospital Stay

## 2024-04-21 ENCOUNTER — Inpatient Hospital Stay

## 2024-04-21 ENCOUNTER — Other Ambulatory Visit: Payer: Self-pay | Admitting: *Deleted

## 2024-04-21 LAB — CULTURE, BLOOD (ROUTINE X 2)
Culture: NO GROWTH
Culture: NO GROWTH
Special Requests: ADEQUATE

## 2024-04-25 ENCOUNTER — Inpatient Hospital Stay

## 2024-04-25 ENCOUNTER — Inpatient Hospital Stay: Admitting: Hematology

## 2024-04-25 ENCOUNTER — Inpatient Hospital Stay: Admitting: Nurse Practitioner

## 2024-04-27 ENCOUNTER — Inpatient Hospital Stay

## 2024-05-02 ENCOUNTER — Inpatient Hospital Stay

## 2024-05-02 ENCOUNTER — Inpatient Hospital Stay: Admitting: Hematology

## 2024-05-04 ENCOUNTER — Inpatient Hospital Stay

## 2024-05-16 ENCOUNTER — Inpatient Hospital Stay: Admitting: Hematology

## 2024-05-16 ENCOUNTER — Inpatient Hospital Stay

## 2024-05-18 ENCOUNTER — Inpatient Hospital Stay

## 2024-05-23 ENCOUNTER — Other Ambulatory Visit

## 2024-08-24 ENCOUNTER — Ambulatory Visit: Admitting: Primary Care

## 2025-02-24 ENCOUNTER — Ambulatory Visit

## 2025-02-24 ENCOUNTER — Encounter: Admitting: Primary Care
